# Patient Record
Sex: Female | Born: 1940 | Race: White | Hispanic: No | Marital: Married | Smoking: Former smoker
Health system: Southern US, Community
[De-identification: ages and names within clinical notes are randomized; demographics above are authoritative.]

## PROBLEM LIST (undated history)

## (undated) DIAGNOSIS — C9300 Acute monoblastic/monocytic leukemia, not having achieved remission: Secondary | ICD-10-CM

## (undated) DIAGNOSIS — I341 Nonrheumatic mitral (valve) prolapse: Secondary | ICD-10-CM

## (undated) DIAGNOSIS — E785 Hyperlipidemia, unspecified: Secondary | ICD-10-CM

## (undated) DIAGNOSIS — I5021 Acute systolic (congestive) heart failure: Secondary | ICD-10-CM

## (undated) DIAGNOSIS — R011 Cardiac murmur, unspecified: Secondary | ICD-10-CM

## (undated) DIAGNOSIS — I10 Essential (primary) hypertension: Secondary | ICD-10-CM

## (undated) DIAGNOSIS — Z9889 Other specified postprocedural states: Secondary | ICD-10-CM

## (undated) DIAGNOSIS — R112 Nausea with vomiting, unspecified: Secondary | ICD-10-CM

## (undated) DIAGNOSIS — F419 Anxiety disorder, unspecified: Secondary | ICD-10-CM

## (undated) HISTORY — DX: Nonrheumatic mitral (valve) prolapse: I34.1

## (undated) HISTORY — DX: Acute monoblastic/monocytic leukemia, not having achieved remission: C93.00

## (undated) HISTORY — DX: Hyperlipidemia, unspecified: E78.5

---

## 1968-05-28 HISTORY — PX: BREAST CYST EXCISION: SHX579

## 1976-05-28 HISTORY — PX: BREAST SURGERY: SHX581

## 1985-05-28 HISTORY — PX: HEMORRHOID SURGERY: SHX153

## 2007-05-29 HISTORY — PX: EYE SURGERY: SHX253

## 2011-04-09 DIAGNOSIS — H47399 Other disorders of optic disc, unspecified eye: Secondary | ICD-10-CM | POA: Insufficient documentation

## 2011-04-09 DIAGNOSIS — D3131 Benign neoplasm of right choroid: Secondary | ICD-10-CM | POA: Insufficient documentation

## 2011-04-09 DIAGNOSIS — Z961 Presence of intraocular lens: Secondary | ICD-10-CM | POA: Insufficient documentation

## 2013-11-27 ENCOUNTER — Encounter (HOSPITAL_COMMUNITY): Payer: Self-pay

## 2013-11-30 NOTE — H&P (Signed)
Kristina Meyer is an 73 y.o. female.   History of Present Illness [facility]; 11/30/2013 8:53 AM) The patient is a 73 year old female who comes in today for a preoperative history and physical. The patient is scheduled for a L5-S1 DISCECTOMY6, RIGHT to be performed by Dr. Duane Lope D. Rolena Infante, MD at [facility] on 12/02/2013 . Please see the hospital record for complete dictated history and physical.  Additional reasons for visit:  Follow-up back is described as the following: The patient is being followed for their back pain. Symptoms reported today include: pain, aching, leg pain (right , posteriorly to the toes), pain with lifting, pain with sitting and pain with standing. The patient states that they are doing poorly. Current treatment includes: activity modification and pain medications. The following medication has been used for pain control: Norco (and Gabapentin (has just started this)). The patient reports their current pain level to be 5-6 / 10.  Allergies [facility]; 11/30/2013 8:47 AM) No Known Drug Allergies06/05/2013  Social History [facility]; 11/30/2013 8:47 AM) Children 1 Current work status retired Games developer weekly; does gym / [employer] Living situation live with spouse Marital status married Never consumed alcohol 10/26/2013: Never consumed alcohol No history of drug/alcohol rehab Not under pain contract Tobacco / smoke exposure 10/26/2013: no Tobacco use Former smoker. 10/26/2013: smoke(d) 1/2 pack(s) per day  Medication History [facility]; 11/30/2013 8:47 AM) Lebron Quam (5-325MG  Tablet, 1 (one) Tablet Oral tid prn, Taken starting 11/16/2013) Active. (rx given at visiit ddb/smt 11/16/13) Neurontin (100MG  Capsule, 1 (one) Capsule Oral TID PRN, Taken starting 11/16/2013) Active. (RX GIVEN AT [facility]IS[facility] DDB/SMT 11/16/13) PARoxetine HCl (20MG  Tablet, Oral) Active. (1/2 tab qd) Atorvastatin Calcium (20MG  Tablet, Oral) Active. (qd) Sulfamethoxazole-TMP DS  (800-160MG  Tablet, Oral) Active. (QD)  Vitals [facility]; 11/30/2013 8:51 AM) 11/30/2013 8:48 AM Weight: 141.03 lb Height: 63in Body Surface Area: 1.69 m Body Mass Index: 24.98 kg/m Temp.: 97.50F  Pulse: 71 (Regular)  BP: 131/83 (Sitting, Left Arm, Standard)    Assessment & Plan Kristina Rinks M. 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   HENT: Negative.   Respiratory: Negative.   Cardiovascular: Negative.   Gastrointestinal: Negative.   Genitourinary: Negative.     There were no vitals taken for this visit. Physical Exam  Constitutional: She is oriented to person, place, and time. She appears well-developed.  HENT:  Head: Normocephalic and atraumatic.  Neck: Normal range of motion.  Cardiovascular: Normal rate.   Murmur heard. Respiratory: Effort normal and breath sounds normal.  GI: Soft. Bowel sounds are normal.  Neurological: She is alert and oriented to person, place, and time.  Skin: Skin is warm and dry.     Assessment/Plan Right L5-S1 HNP Will proceed with Meyer as scheduled.  Procedure along with possible risks and complications discussed.  All questions answered.    [facility] M 11/30/2013, 10:29 AM

## 2013-12-01 ENCOUNTER — Encounter (HOSPITAL_COMMUNITY)
Admission: RE | Admit: 2013-12-01 | Discharge: 2013-12-01 | Disposition: A | Discharge status: Home / Self Care | Payer: Medicare Other | Source: Ambulatory Visit | Attending: Orthopedic Surgery | Admitting: Orthopedic Surgery

## 2013-12-01 ENCOUNTER — Encounter (HOSPITAL_COMMUNITY): Payer: Self-pay

## 2013-12-01 HISTORY — DX: Anxiety disorder, unspecified: F41.9

## 2013-12-01 HISTORY — DX: Other specified postprocedural states: Z98.890

## 2013-12-01 HISTORY — DX: Nausea with vomiting, unspecified: R11.2

## 2013-12-01 HISTORY — DX: Cardiac murmur, unspecified: R01.1

## 2013-12-01 LAB — CBC
HCT: 39.6 % (ref 36.0–46.0)
HEMOGLOBIN: 12.4 g/dL (ref 12.0–15.0)
MCH: 29.7 pg (ref 26.0–34.0)
MCHC: 31.3 g/dL (ref 30.0–36.0)
MCV: 94.7 fL (ref 78.0–100.0)
PLATELETS: 219 10*3/uL (ref 150–400)
RBC: 4.18 MIL/uL (ref 3.87–5.11)
RDW: 12.3 % (ref 11.5–15.5)
WBC: 6.4 10*3/uL (ref 4.0–10.5)

## 2013-12-01 LAB — SURGICAL PCR SCREEN
MRSA, PCR: NEGATIVE
Staphylococcus aureus: NEGATIVE

## 2013-12-01 LAB — BASIC METABOLIC PANEL
Anion gap: 12 (ref 5–15)
BUN: 17 mg/dL (ref 6–23)
CO2: 26 mEq/L (ref 19–32)
Calcium: 9.3 mg/dL (ref 8.4–10.5)
Chloride: 103 mEq/L (ref 96–112)
Creatinine, Ser: 0.89 mg/dL (ref 0.50–1.10)
GFR calc Af Amer: 72 mL/min — ABNORMAL LOW (ref >90)
GFR, EST NON AFRICAN AMERICAN: 62 mL/min — AB (ref >90)
GLUCOSE: 99 mg/dL (ref 70–99)
Potassium: 4.5 mEq/L (ref 3.7–5.3)
Sodium: 141 mEq/L (ref 137–147)

## 2013-12-01 NOTE — H&P (Signed)
All questions addressed Agree with above Plan on surgery as scheduled

## 2013-12-01 NOTE — Pre-Procedure Instructions (Addendum)
Gemma Ruan  12/01/2013   Your procedure is scheduled on:  12/02/13  Report to [facility] short stay admitting at 35 AM.  Call this number if you have problems the morning of surgery: [phone]   Remember:   Do not eat food or drink liquids after midnight.   Take these medicines the morning of surgery with A SIP OF WATER: gabapentin, pain med if needed, paxil, bactrim     Take all meds as ordered until day of surgery except as instructed below or per dr         Bridgette Habermann all herbel meds, nsaids (aleve,naproxen,advil,ibuprofen) now including vitamins, aspirin   Do not wear jewelry, make-up or nail polish.  Do not wear lotions, powders, or perfumes. You may wear deodorant.  Do not shave 48 hours prior to surgery. Men may shave face and neck.  Do not bring valuables to the hospital.  [facility] is not responsible                  for any belongings or valuables.               Contacts, dentures or bridgework may not be worn into surgery.  Leave suitcase in the car. After surgery it may be brought to your room.  For patients admitted to the hospital, discharge time is determined by your                treatment team.               Patients discharged the day of surgery will not be allowed to drive  home.  Name and phone number of your driver:   Special Instructions:  Special Instructions: Wake - Preparing for Surgery  Before surgery, you can play an important role.  Because skin is not sterile, your skin needs to be as free of germs as possible.  You can reduce the number of germs on you skin by washing with CHG (chlorahexidine gluconate) soap before surgery.  CHG is an antiseptic cleaner which kills germs and bonds with the skin to continue killing germs even after washing.  Please DO NOT use if you have an allergy to CHG or antibacterial soaps.  If your skin becomes reddened/irritated stop using the CHG and inform your nurse when you arrive at Short Stay.  Do not shave (including  legs and underarms) for at least 48 hours prior to the first CHG shower.  You may shave your face.  Please follow these instructions carefully:   1.  Shower with CHG Soap the night before surgery and the morning of Surgery.  2.  If you choose to wash your hair, wash your hair first as usual with your normal shampoo.  3.  After you shampoo, rinse your hair and body thoroughly to remove the Shampoo.  4.  Use CHG as you would any other liquid soap.  You can apply chg directly  to the skin and wash gently with scrungie or a clean washcloth.  5.  Apply the CHG Soap to your body ONLY FROM THE NECK DOWN.  Do not use on open wounds or open sores.  Avoid contact with your eyes ears, mouth and genitals (private parts).  Wash genitals (private parts)       with your normal soap.  6.  Wash thoroughly, paying special attention to the area where your surgery will be performed.  7.  Thoroughly rinse your body with warm water from the  neck down.  8.  DO NOT shower/wash with your normal soap after using and rinsing off the CHG Soap.  9.  Pat yourself dry with a clean towel.            10.  Wear clean pajamas.            11.  Place clean sheets on your bed the night of your first shower and do not sleep with pets.  Day of Surgery  Do not apply any lotions/deodorants the morning of surgery.  Please wear clean clothes to the hospital/surgery center.   Please read over the following fact sheets that you were given: Pain Booklet, Coughing and Deep Breathing, MRSA Information and Surgical Site Infection Prevention

## 2013-12-02 ENCOUNTER — Ambulatory Visit (HOSPITAL_COMMUNITY)
Admission: RE | Admit: 2013-12-02 | Discharge: 2013-12-03 | Disposition: A | Discharge status: Home / Self Care | Payer: Medicare Other | Source: Ambulatory Visit | Attending: Orthopedic Surgery | Admitting: Orthopedic Surgery

## 2013-12-02 ENCOUNTER — Ambulatory Visit (HOSPITAL_COMMUNITY): Payer: Medicare Other

## 2013-12-02 ENCOUNTER — Encounter (HOSPITAL_COMMUNITY): Payer: Self-pay | Admitting: *Deleted

## 2013-12-02 ENCOUNTER — Ambulatory Visit (HOSPITAL_COMMUNITY): Payer: Medicare Other | Admitting: Anesthesiology

## 2013-12-02 ENCOUNTER — Encounter (HOSPITAL_COMMUNITY): Payer: Medicare Other | Admitting: Anesthesiology

## 2013-12-02 ENCOUNTER — Encounter (HOSPITAL_COMMUNITY)
Admission: RE | Disposition: A | Discharge status: Home / Self Care | Payer: Self-pay | Source: Ambulatory Visit | Attending: Orthopedic Surgery

## 2013-12-02 DIAGNOSIS — M549 Dorsalgia, unspecified: Secondary | ICD-10-CM | POA: Diagnosis present

## 2013-12-02 DIAGNOSIS — Z87891 Personal history of nicotine dependence: Secondary | ICD-10-CM | POA: Insufficient documentation

## 2013-12-02 DIAGNOSIS — Z9889 Other specified postprocedural states: Secondary | ICD-10-CM

## 2013-12-02 DIAGNOSIS — M5126 Other intervertebral disc displacement, lumbar region: Secondary | ICD-10-CM | POA: Insufficient documentation

## 2013-12-02 DIAGNOSIS — R011 Cardiac murmur, unspecified: Secondary | ICD-10-CM | POA: Insufficient documentation

## 2013-12-02 HISTORY — PX: LUMBAR LAMINECTOMY/DECOMPRESSION MICRODISCECTOMY: SHX5026

## 2013-12-02 SURGERY — LUMBAR LAMINECTOMY/DECOMPRESSION MICRODISCECTOMY 1 LEVEL
Anesthesia: General | Site: Spine Lumbar | Laterality: Right

## 2013-12-02 MED ORDER — CEFAZOLIN SODIUM 1-5 GM-% IV SOLN
1.0000 g | Freq: Three times a day (TID) | INTRAVENOUS | Status: AC
  Administered 2013-12-02 (×2): 1 g via INTRAVENOUS
  Filled 2013-12-02 (×3): qty 50

## 2013-12-02 MED ORDER — EPHEDRINE SULFATE 50 MG/ML IJ SOLN
INTRAMUSCULAR | Status: AC
  Filled 2013-12-02: qty 1

## 2013-12-02 MED ORDER — LIDOCAINE HCL (CARDIAC) 20 MG/ML IV SOLN
INTRAVENOUS | Status: DC | PRN
  Administered 2013-12-02: 40 mg via INTRAVENOUS

## 2013-12-02 MED ORDER — LIDOCAINE HCL (CARDIAC) 20 MG/ML IV SOLN
INTRAVENOUS | Status: AC
  Filled 2013-12-02: qty 5

## 2013-12-02 MED ORDER — DEXAMETHASONE 4 MG PO TABS
4.0000 mg | ORAL_TABLET | Freq: Four times a day (QID) | ORAL | Status: DC
  Administered 2013-12-02 – 2013-12-03 (×2): 4 mg via ORAL
  Filled 2013-12-02 (×8): qty 1

## 2013-12-02 MED ORDER — LACTATED RINGERS IV SOLN
INTRAVENOUS | Status: DC | PRN
  Administered 2013-12-02 (×2): via INTRAVENOUS

## 2013-12-02 MED ORDER — EPHEDRINE SULFATE 50 MG/ML IJ SOLN
INTRAMUSCULAR | Status: DC | PRN
  Administered 2013-12-02: 5 mg via INTRAVENOUS
  Administered 2013-12-02: 10 mg via INTRAVENOUS

## 2013-12-02 MED ORDER — OXYCODONE HCL 5 MG PO TABS: 5.0000 mg | ORAL_TABLET | Freq: Once | ORAL | Status: DC | PRN

## 2013-12-02 MED ORDER — SODIUM CHLORIDE 0.9 % IJ SOLN: 3.0000 mL | Freq: Two times a day (BID) | INTRAMUSCULAR | Status: DC

## 2013-12-02 MED ORDER — ACETAMINOPHEN 10 MG/ML IV SOLN
1000.0000 mg | Freq: Four times a day (QID) | INTRAVENOUS | Status: AC
  Administered 2013-12-02 – 2013-12-03 (×4): 1000 mg via INTRAVENOUS
  Filled 2013-12-02 (×3): qty 100

## 2013-12-02 MED ORDER — MIDAZOLAM HCL 2 MG/2ML IJ SOLN
INTRAMUSCULAR | Status: AC
  Filled 2013-12-02: qty 2

## 2013-12-02 MED ORDER — SODIUM CHLORIDE 0.9 % IJ SOLN
INTRAMUSCULAR | Status: AC
Start: 2013-12-02 — End: 2013-12-02
  Filled 2013-12-02: qty 10

## 2013-12-02 MED ORDER — SUFENTANIL CITRATE 50 MCG/ML IV SOLN
INTRAVENOUS | Status: AC
  Filled 2013-12-02: qty 1

## 2013-12-02 MED ORDER — NEOSTIGMINE METHYLSULFATE 10 MG/10ML IV SOLN
INTRAVENOUS | Status: DC | PRN
  Administered 2013-12-02: 3 mg via INTRAVENOUS

## 2013-12-02 MED ORDER — ACETAMINOPHEN 10 MG/ML IV SOLN
1000.0000 mg | Freq: Four times a day (QID) | INTRAVENOUS | Status: DC
  Administered 2013-12-02: 1000 mg via INTRAVENOUS
  Filled 2013-12-02: qty 100

## 2013-12-02 MED ORDER — SODIUM CHLORIDE 0.9 % IJ SOLN: 3.0000 mL | INTRAMUSCULAR | Status: DC | PRN

## 2013-12-02 MED ORDER — GLYCOPYRROLATE 0.2 MG/ML IJ SOLN
INTRAMUSCULAR | Status: AC
  Filled 2013-12-02: qty 2

## 2013-12-02 MED ORDER — CEFAZOLIN SODIUM-DEXTROSE 2-3 GM-% IV SOLR
INTRAVENOUS | Status: AC
  Administered 2013-12-02: 2 g via INTRAVENOUS
  Filled 2013-12-02: qty 50

## 2013-12-02 MED ORDER — ONDANSETRON HCL 4 MG/2ML IJ SOLN
INTRAMUSCULAR | Status: DC | PRN
  Administered 2013-12-02: 4 mg via INTRAVENOUS

## 2013-12-02 MED ORDER — SUCCINYLCHOLINE CHLORIDE 20 MG/ML IJ SOLN
INTRAMUSCULAR | Status: AC
Start: 2013-12-02 — End: 2013-12-02
  Filled 2013-12-02: qty 1

## 2013-12-02 MED ORDER — METOCLOPRAMIDE HCL 5 MG/ML IJ SOLN
INTRAMUSCULAR | Status: AC
  Filled 2013-12-02: qty 2

## 2013-12-02 MED ORDER — PHENYLEPHRINE 40 MCG/ML (10ML) SYRINGE FOR IV PUSH (FOR BLOOD PRESSURE SUPPORT)
PREFILLED_SYRINGE | INTRAVENOUS | Status: AC
  Filled 2013-12-02: qty 10

## 2013-12-02 MED ORDER — HYDROMORPHONE HCL PF 1 MG/ML IJ SOLN: 0.2500 mg | INTRAMUSCULAR | Status: DC | PRN

## 2013-12-02 MED ORDER — PAROXETINE HCL 20 MG PO TABS
20.0000 mg | ORAL_TABLET | Freq: Every day | ORAL | Status: DC
  Administered 2013-12-03: 20 mg via ORAL
  Filled 2013-12-02: qty 1

## 2013-12-02 MED ORDER — OXYCODONE HCL 5 MG/5ML PO SOLN: 5.0000 mg | Freq: Once | ORAL | Status: DC | PRN

## 2013-12-02 MED ORDER — ONDANSETRON HCL 4 MG/2ML IJ SOLN
INTRAMUSCULAR | Status: AC
  Filled 2013-12-02: qty 2

## 2013-12-02 MED ORDER — PROPOFOL 10 MG/ML IV BOLUS
INTRAVENOUS | Status: AC
  Filled 2013-12-02: qty 20

## 2013-12-02 MED ORDER — DEXAMETHASONE SODIUM PHOSPHATE 4 MG/ML IJ SOLN
4.0000 mg | Freq: Four times a day (QID) | INTRAMUSCULAR | Status: DC
Start: 2013-12-02 — End: 2013-12-03
  Administered 2013-12-02 (×2): 4 mg via INTRAVENOUS
  Filled 2013-12-02 (×8): qty 1

## 2013-12-02 MED ORDER — METOCLOPRAMIDE HCL 5 MG/ML IJ SOLN
INTRAMUSCULAR | Status: DC | PRN
  Administered 2013-12-02: 10 mg via INTRAVENOUS

## 2013-12-02 MED ORDER — GLYCOPYRROLATE 0.2 MG/ML IJ SOLN
INTRAMUSCULAR | Status: DC | PRN
  Administered 2013-12-02: 0.4 mg via INTRAVENOUS

## 2013-12-02 MED ORDER — ROCURONIUM BROMIDE 50 MG/5ML IV SOLN
INTRAVENOUS | Status: AC
  Filled 2013-12-02: qty 1

## 2013-12-02 MED ORDER — PROPOFOL 10 MG/ML IV BOLUS
INTRAVENOUS | Status: DC | PRN
  Administered 2013-12-02: 140 mg via INTRAVENOUS

## 2013-12-02 MED ORDER — 0.9 % SODIUM CHLORIDE (POUR BTL) OPTIME
TOPICAL | Status: DC | PRN
  Administered 2013-12-02: 1000 mL

## 2013-12-02 MED ORDER — PHENYLEPHRINE HCL 10 MG/ML IJ SOLN
INTRAMUSCULAR | Status: DC | PRN
  Administered 2013-12-02 (×3): 80 ug via INTRAVENOUS

## 2013-12-02 MED ORDER — ROCURONIUM BROMIDE 100 MG/10ML IV SOLN
INTRAVENOUS | Status: DC | PRN
  Administered 2013-12-02: 40 mg via INTRAVENOUS

## 2013-12-02 MED ORDER — MORPHINE SULFATE 2 MG/ML IJ SOLN: 1.0000 mg | INTRAMUSCULAR | Status: DC | PRN

## 2013-12-02 MED ORDER — SUFENTANIL CITRATE 50 MCG/ML IV SOLN
INTRAVENOUS | Status: DC | PRN
  Administered 2013-12-02: 20 ug via INTRAVENOUS
  Administered 2013-12-02: 5 ug via INTRAVENOUS

## 2013-12-02 MED ORDER — MENTHOL 3 MG MT LOZG
1.0000 | LOZENGE | OROMUCOSAL | Status: DC | PRN
Start: 2013-12-02 — End: 2013-12-03
  Filled 2013-12-02: qty 9

## 2013-12-02 MED ORDER — NEOSTIGMINE METHYLSULFATE 10 MG/10ML IV SOLN
INTRAVENOUS | Status: AC
  Filled 2013-12-02: qty 1

## 2013-12-02 MED ORDER — DEXAMETHASONE SODIUM PHOSPHATE 4 MG/ML IJ SOLN
4.0000 mg | Freq: Once | INTRAMUSCULAR | Status: AC
Start: 2013-12-02 — End: 2013-12-02
  Administered 2013-12-02: 4 mg via INTRAVENOUS
  Filled 2013-12-02: qty 1

## 2013-12-02 MED ORDER — THROMBIN 20000 UNITS EX SOLR
CUTANEOUS | Status: AC
  Filled 2013-12-02: qty 20000

## 2013-12-02 MED ORDER — METOCLOPRAMIDE HCL 5 MG/ML IJ SOLN: 10.0000 mg | Freq: Once | INTRAMUSCULAR | Status: DC | PRN

## 2013-12-02 MED ORDER — LACTATED RINGERS IV SOLN
INTRAVENOUS | Status: DC
  Administered 2013-12-02: 12:00:00 via INTRAVENOUS

## 2013-12-02 MED ORDER — SODIUM CHLORIDE 0.9 % IV SOLN: 250.0000 mL | INTRAVENOUS | Status: DC

## 2013-12-02 MED ORDER — ATORVASTATIN CALCIUM 10 MG PO TABS
10.0000 mg | ORAL_TABLET | Freq: Every day | ORAL | Status: DC
  Administered 2013-12-02: 10 mg via ORAL
  Filled 2013-12-02 (×2): qty 1

## 2013-12-02 MED ORDER — PHENOL 1.4 % MT LIQD
1.0000 | OROMUCOSAL | Status: DC | PRN
  Administered 2013-12-02: 1 via OROMUCOSAL

## 2013-12-02 MED ORDER — METHOCARBAMOL 1000 MG/10ML IJ SOLN
500.0000 mg | Freq: Four times a day (QID) | INTRAVENOUS | Status: DC | PRN
  Filled 2013-12-02: qty 5

## 2013-12-02 MED ORDER — ONDANSETRON HCL 4 MG/2ML IJ SOLN: 4.0000 mg | INTRAMUSCULAR | Status: DC | PRN

## 2013-12-02 MED ORDER — MIDAZOLAM HCL 5 MG/5ML IJ SOLN
INTRAMUSCULAR | Status: DC | PRN
  Administered 2013-12-02: 1 mg via INTRAVENOUS

## 2013-12-02 MED ORDER — BUPIVACAINE-EPINEPHRINE 0.25% -1:200000 IJ SOLN
INTRAMUSCULAR | Status: DC | PRN
  Administered 2013-12-02: 10 mL

## 2013-12-02 MED ORDER — THROMBIN 20000 UNITS EX SOLR
CUTANEOUS | Status: DC | PRN
  Administered 2013-12-02: 09:00:00 via TOPICAL

## 2013-12-02 MED ORDER — OXYCODONE HCL 5 MG PO TABS
10.0000 mg | ORAL_TABLET | ORAL | Status: DC | PRN
  Administered 2013-12-02: 10 mg via ORAL
  Filled 2013-12-02: qty 2

## 2013-12-02 MED ORDER — METHOCARBAMOL 500 MG PO TABS
500.0000 mg | ORAL_TABLET | Freq: Four times a day (QID) | ORAL | Status: DC | PRN
  Administered 2013-12-02: 500 mg via ORAL
  Filled 2013-12-02: qty 1

## 2013-12-02 MED ORDER — BUPIVACAINE-EPINEPHRINE (PF) 0.25% -1:200000 IJ SOLN
INTRAMUSCULAR | Status: AC
  Filled 2013-12-02: qty 30

## 2013-12-02 SURGICAL SUPPLY — 62 items
BNDG GAUZE ELAST 4 BULKY (GAUZE/BANDAGES/DRESSINGS) ×3 IMPLANT
BUR EGG ELITE 4.0 (BURR) IMPLANT
BUR EGG ELITE 4.0MM (BURR)
BUR MATCHSTICK NEURO 3.0 LAGG (BURR) IMPLANT
CANISTER SUCTION 2500CC (MISCELLANEOUS) ×3 IMPLANT
CLOSURE STERI-STRIP 1/2X4 (GAUZE/BANDAGES/DRESSINGS) ×1
CLSR STERI-STRIP ANTIMIC 1/2X4 (GAUZE/BANDAGES/DRESSINGS) ×2 IMPLANT
CORDS BIPOLAR (ELECTRODE) ×3 IMPLANT
COVER SURGICAL LIGHT HANDLE (MISCELLANEOUS) ×3 IMPLANT
DRAIN CHANNEL 15F RND FF W/TCR (WOUND CARE) ×3 IMPLANT
DRAPE POUCH INSTRU U-SHP 10X18 (DRAPES) ×3 IMPLANT
DRAPE SURG 17X23 STRL (DRAPES) ×3 IMPLANT
DRAPE U-SHAPE 47X51 STRL (DRAPES) ×3 IMPLANT
DRSG MEPILEX BORDER 4X4 (GAUZE/BANDAGES/DRESSINGS) ×3 IMPLANT
DRSG MEPILEX BORDER 4X8 (GAUZE/BANDAGES/DRESSINGS) ×3 IMPLANT
DURAPREP 26ML APPLICATOR (WOUND CARE) ×3 IMPLANT
ELECT BLADE 4.0 EZ CLEAN MEGAD (MISCELLANEOUS) ×3
ELECT CAUTERY BLADE 6.4 (BLADE) ×3 IMPLANT
ELECT PENCIL ROCKER SW 15FT (MISCELLANEOUS) ×3 IMPLANT
ELECT REM PT RETURN 9FT ADLT (ELECTROSURGICAL) ×3
ELECTRODE BLDE 4.0 EZ CLN MEGD (MISCELLANEOUS) ×1 IMPLANT
ELECTRODE REM PT RTRN 9FT ADLT (ELECTROSURGICAL) ×1 IMPLANT
EVACUATOR SILICONE 100CC (DRAIN) ×3 IMPLANT
GLOVE BIOGEL PI IND STRL 8 (GLOVE) ×1 IMPLANT
GLOVE BIOGEL PI IND STRL 8.5 (GLOVE) ×1 IMPLANT
GLOVE BIOGEL PI INDICATOR 8 (GLOVE) ×2
GLOVE BIOGEL PI INDICATOR 8.5 (GLOVE) ×2
GLOVE ECLIPSE 8.5 STRL (GLOVE) ×3 IMPLANT
GLOVE ORTHO TXT STRL SZ7.5 (GLOVE) ×3 IMPLANT
GOWN STRL REUS W/ TWL LRG LVL3 (GOWN DISPOSABLE) ×1 IMPLANT
GOWN STRL REUS W/ TWL XL LVL3 (GOWN DISPOSABLE) ×2 IMPLANT
GOWN STRL REUS W/TWL 2XL LVL3 (GOWN DISPOSABLE) ×6 IMPLANT
GOWN STRL REUS W/TWL LRG LVL3 (GOWN DISPOSABLE) ×2
GOWN STRL REUS W/TWL XL LVL3 (GOWN DISPOSABLE) ×4
KIT BASIN OR (CUSTOM PROCEDURE TRAY) ×3 IMPLANT
KIT ROOM TURNOVER OR (KITS) ×3 IMPLANT
NEEDLE 22X1 1/2 (OR ONLY) (NEEDLE) ×3 IMPLANT
NEEDLE SPNL 18GX3.5 QUINCKE PK (NEEDLE) ×6 IMPLANT
NS IRRIG 1000ML POUR BTL (IV SOLUTION) ×3 IMPLANT
PACK LAMINECTOMY ORTHO (CUSTOM PROCEDURE TRAY) ×3 IMPLANT
PACK UNIVERSAL I (CUSTOM PROCEDURE TRAY) ×3 IMPLANT
PAD ARMBOARD 7.5X6 YLW CONV (MISCELLANEOUS) ×6 IMPLANT
PATTIES SURGICAL .5 X.5 (GAUZE/BANDAGES/DRESSINGS) IMPLANT
PATTIES SURGICAL .5 X1 (DISPOSABLE) ×3 IMPLANT
SPONGE SURGIFOAM ABS GEL 100 (HEMOSTASIS) ×3 IMPLANT
SURGIFLO TRUKIT (HEMOSTASIS) IMPLANT
SUT BONE WAX W31G (SUTURE) ×3 IMPLANT
SUT MON AB 3-0 SH 27 (SUTURE) ×2
SUT MON AB 3-0 SH27 (SUTURE) ×1 IMPLANT
SUT VIC AB 0 CT1 27 (SUTURE) ×2
SUT VIC AB 0 CT1 27XBRD ANBCTR (SUTURE) ×1 IMPLANT
SUT VIC AB 1 CT1 18XCR BRD 8 (SUTURE) ×1 IMPLANT
SUT VIC AB 1 CT1 8-18 (SUTURE) ×2
SUT VIC AB 1 CTX 36 (SUTURE) ×4
SUT VIC AB 1 CTX36XBRD ANBCTR (SUTURE) ×2 IMPLANT
SUT VIC AB 2-0 CT1 18 (SUTURE) ×3 IMPLANT
SYR BULB IRRIGATION 50ML (SYRINGE) ×3 IMPLANT
SYR CONTROL 10ML LL (SYRINGE) ×3 IMPLANT
TOWEL OR 17X24 6PK STRL BLUE (TOWEL DISPOSABLE) ×3 IMPLANT
TOWEL OR 17X26 10 PK STRL BLUE (TOWEL DISPOSABLE) ×3 IMPLANT
WATER STERILE IRR 1000ML POUR (IV SOLUTION) IMPLANT
YANKAUER SUCT BULB TIP NO VENT (SUCTIONS) ×3 IMPLANT

## 2013-12-02 NOTE — Progress Notes (Signed)
Pts partial teeth given back to pt

## 2013-12-02 NOTE — Brief Op Note (Signed)
12/02/2013  10:02 AM  PATIENT:  [facility]  73 y.o. female  PRE-OPERATIVE DIAGNOSIS:  L5-S1 RIGHT HNP  POST-OPERATIVE DIAGNOSIS:  L5-S1 RIGHT HNP  PROCEDURE:  Procedure(s): L5-S1 RIGHT DISCECTOMY (Right)  SURGEON:  Surgeon(s) and Role:    * Melina Schools, MD - Primary  PHYSICIAN ASSISTANT:   ASSISTANTS: Benjiman Core   ANESTHESIA:   general  EBL:  Total I/O In: 1000 [I.V.:1000] Out: -   BLOOD ADMINISTERED:none  DRAINS: none   LOCAL MEDICATIONS USED:  MARCAINE     SPECIMEN:  No Specimen  DISPOSITION OF SPECIMEN:  N/A  COUNTS:  YES  TOURNIQUET:  * No tourniquets in log *  DICTATION: .Other Dictation: Dictation Number [id number]  PLAN OF CARE: Admit for overnight observation  PATIENT DISPOSITION:  PACU - hemodynamically stable.

## 2013-12-02 NOTE — Anesthesia Postprocedure Evaluation (Signed)
Anesthesia Post Note  Patient: Kristina [facility]  Procedure(s) Performed: Procedure(s) (LRB): L5-S1 RIGHT DISCECTOMY (Right)  Anesthesia type: General  Patient location: PACU  Post pain: Pain level controlled  Post assessment: Patient's Cardiovascular Status Stable  Last Vitals:  Filed Vitals:   12/02/13 1052  BP:   Pulse: 90  Temp: 36.1 C  Resp: 8    Post vital signs: Reviewed and stable  Level of consciousness: alert  Complications: No apparent anesthesia complications

## 2013-12-02 NOTE — Anesthesia Preprocedure Evaluation (Addendum)
Anesthesia Evaluation  Patient identified by MRN, date of birth, ID band Patient awake    Reviewed: Allergy & Precautions, H&P , NPO status , Patient's Chart, lab work & pertinent test results, reviewed documented beta blocker date and time   History of Anesthesia Complications (+) PONV and history of anesthetic complications  Airway Mallampati: II TM Distance: >3 FB Neck ROM: full    Dental   Pulmonary neg pulmonary ROS, former smoker,  breath sounds clear to auscultation        Cardiovascular + Valvular Problems/Murmurs Rhythm:regular     Neuro/Psych negative neurological ROS     GI/Hepatic negative GI ROS, Neg liver ROS,   Endo/Other  negative endocrine ROS  Renal/GU negative Renal ROS  negative genitourinary   Musculoskeletal   Abdominal   Peds  Hematology negative hematology ROS (+)   Anesthesia Other Findings See surgeon's H&P   Reproductive/Obstetrics negative OB ROS                          Anesthesia Physical Anesthesia Plan  ASA: II  Anesthesia Plan: General   Post-op Pain Management:    Induction: Intravenous  Airway Management Planned: Oral ETT  Additional Equipment:   Intra-op Plan:   Post-operative Plan: Extubation in OR  Informed Consent: I have reviewed the patients History and Physical, chart, labs and discussed the procedure including the risks, benefits and alternatives for the proposed anesthesia with the patient or authorized representative who has indicated his/her understanding and acceptance.   Dental Advisory Given  Plan Discussed with: CRNA and Surgeon  Anesthesia Plan Comments:        Anesthesia Quick Evaluation

## 2013-12-02 NOTE — Evaluation (Signed)
Physical Therapy Evaluation Patient Details Name: Kristina [facility] MRN: [id number] DOB: 11-18-1940 Today's Date: 12/02/2013   History of Present Illness  Pt presents for L5-S1 diskectomy  Clinical Impression  Pt admitted with lower back and right leg pain which is currently not present since surgery. Pt ambulated 250' with no AD, mod I level as well as ascending and descending 5 steps with supervision and no difficulties. Back precautions and posture reviewed. No acute or f.u PT needs at this time. PT signing off.     Follow Up Recommendations No PT follow up;Other (comment) (OP in future if needed)    Equipment Recommendations  None recommended by PT    Recommendations for Other Services       Precautions / Restrictions Precautions Precautions: Back Precaution Booklet Issued: Yes (comment) Precaution Comments: reviewed back precautions and proper posture and positioning Required Braces or Orthoses: Spinal Brace Spinal Brace: Lumbar corset;Applied in sitting position Restrictions Weight Bearing Restrictions: No      Mobility  Bed Mobility Overal bed mobility: Modified Independent             General bed mobility comments: reviewed log rolling for healthy back during bed mobility, pt performed safety  Transfers Overall transfer level: Modified independent Equipment used: None                Ambulation/Gait Ambulation/Gait assistance: Modified independent (Device/Increase time) Ambulation Distance (Feet): 250 Feet Assistive device: Rolling walker (2 wheeled);None Gait Pattern/deviations: Decreased stride length Gait velocity: decreased, guarded   General Gait Details: began ambulation with RW in case pt experienced dizziness but she did not and was not unsteady so no AD used after first 39'  Stairs Stairs: Yes Stairs assistance: Supervision Stair Management: One rail Right;Alternating pattern;Forwards Number of Stairs: 5 General stair comments: pt had no  difficulty with stairs and was able to perform with alternating pattern  Wheelchair Mobility    Modified Rankin (Stroke Patients Only)       Balance Overall balance assessment: No apparent balance deficits (not formally assessed)                                           Pertinent Vitals/Pain No c/o pain    Home Living Family/patient expects to be discharged to:: Private residence Living Arrangements: Spouse/significant other Available Help at Discharge: Family;Available 24 hours/day Type of Home: House Home Access: Stairs to enter Entrance Stairs-Rails: Right Entrance Stairs-Number of Steps: 4 Home Layout: Two level Home Equipment: Cane - single point Additional Comments: pt used cane on stairs and out of house PTA    Prior Function Level of Independence: Independent with assistive device(s)         Comments: pt went to low impact aerobics 3x/ week before surgery     Hand Dominance        Extremity/Trunk Assessment   Upper Extremity Assessment: Overall WFL for tasks assessed           Lower Extremity Assessment: Overall WFL for tasks assessed      Cervical / Trunk Assessment: Normal  Communication   Communication: No difficulties  Cognition Arousal/Alertness: Awake/alert Behavior During Therapy: WFL for tasks assessed/performed Overall Cognitive Status: Within Functional Limits for tasks assessed                      General Comments General comments (skin integrity, edema,  etc.): pt reports that radicular pain before surgery is currently gone    Exercises Other Exercises Other Exercises: pelvic tilt in supine x5 for bed mobility and abdominal activation      Assessment/Plan    PT Assessment Patent does not need any further PT services  PT Diagnosis     PT Problem List    PT Treatment Interventions     PT Goals (Current goals can be found in the Care Plan section) Acute Rehab PT Goals Patient Stated Goal:  return home PT Goal Formulation: No goals set, d/c therapy    Frequency     Barriers to discharge        Co-evaluation               End of Session Equipment Utilized During Treatment: Back brace;Gait belt Activity Tolerance: Patient tolerated treatment well Patient left: in bed;with call bell/phone within reach;with family/visitor present Nurse Communication: Mobility status    Functional Assessment Tool Used: clinical judgement Functional Limitation: Mobility: Walking and moving around Mobility: Walking and Moving Around Current Status (B3419): At least 1 percent but less than 20 percent impaired, limited or restricted Mobility: Walking and Moving Around Goal Status [phone]): At least 1 percent but less than 20 percent impaired, limited or restricted Mobility: Walking and Moving Around Discharge Status [phone]): At least 1 percent but less than 20 percent impaired, limited or restricted    Time: 1510-1535 PT Time Calculation (min): 25 min   Charges:   PT Evaluation $Initial PT Evaluation Tier I: 1 Procedure PT Treatments $Gait Training: 23-37 mins   PT G Codes:   Functional Assessment Tool Used: clinical judgement Functional Limitation: Mobility: Walking and moving around   [facility], [place]  Leighton Roach 12/02/2013, 3:48 PM

## 2013-12-02 NOTE — Anesthesia Procedure Notes (Signed)
Procedure Name: Intubation Date/Time: 12/02/2013 8:33 AM Performed by: Melina Copa, Aviyanna Colbaugh R Pre-anesthesia Checklist: Patient identified, Emergency Drugs available, Suction available, Patient being monitored and Timeout performed Patient Re-evaluated:Patient Re-evaluated prior to inductionOxygen Delivery Method: Circle system utilized Preoxygenation: Pre-oxygenation with 100% oxygen Intubation Type: IV induction Ventilation: Mask ventilation without difficulty Laryngoscope Size: Mac and 3 Grade View: Grade II Tube type: Oral Tube size: 7.5 mm Number of attempts: 1 Airway Equipment and Method: Stylet Placement Confirmation: ETT inserted through vocal cords under direct vision,  positive ETCO2 and breath sounds checked- equal and bilateral Secured at: 21 cm Tube secured with: Tape Dental Injury: Teeth and Oropharynx as per pre-operative assessment

## 2013-12-02 NOTE — Transfer of Care (Signed)
Immediate Anesthesia Transfer of Care Note  Patient: Krist[facility]  Procedure(s) Performed: Procedure(s): L5-S1 RIGHT DISCECTOMY (Right)  Patient Location: PACU  Anesthesia Type:General  Level of Consciousness: awake  Airway & Oxygen Therapy: Patient Spontanous Breathing and Patient connected to nasal cannula oxygen  Post-op Assessment: Report given to PACU RN, Post -op Vital signs reviewed and stable and Patient moving all extremities  Post vital signs: Reviewed and stable  Complications: No apparent anesthesia complications

## 2013-12-02 NOTE — H&P (Signed)
No change in clinical exam Right leg pain R L5/S1 HNP H+P reviewed

## 2013-12-02 NOTE — Progress Notes (Signed)
CSW received consult for SNF placement. CSW went to speak to patient regarding SNF placement. Patient states she is not interested in going to a SNF and lives with her husband who is her main support. CSW will notify Case Manager RN to follow up with patient for Home Health pending PT eval. CSW signing off at this time. Please re consult if social work needs arise.  Jeanette Caprice, MSW, [place]

## 2013-12-03 MED ORDER — ONDANSETRON 4 MG PO TBDP
4.0000 mg | ORAL_TABLET | Freq: Three times a day (TID) | ORAL | Status: DC | PRN
Start: 2013-12-03 — End: 2015-12-21

## 2013-12-03 MED ORDER — DOCUSATE SODIUM 100 MG PO CAPS: 100.0000 mg | ORAL_CAPSULE | Freq: Two times a day (BID) | ORAL | Status: DC

## 2013-12-03 MED ORDER — POLYETHYLENE GLYCOL 3350 17 G PO PACK: 17.0000 g | PACK | Freq: Every day | ORAL | Status: DC

## 2013-12-03 MED ORDER — METHOCARBAMOL 500 MG PO TABS: 500.0000 mg | ORAL_TABLET | Freq: Four times a day (QID) | ORAL | Status: DC | PRN

## 2013-12-03 MED ORDER — OXYCODONE-ACETAMINOPHEN 5-325 MG PO TABS: 1.0000 | ORAL_TABLET | Freq: Four times a day (QID) | ORAL | Status: DC | PRN

## 2013-12-03 NOTE — Evaluation (Signed)
Occupational Therapy Evaluation and Discharge Patient Details Name: Kristina Meyer MRN: [id number] DOB: 22-Aug-1940 Today's Date: 12/03/2013    History of Present Illness Pt presents for L5-S1 diskectomy   Clinical Impression   This 73 yo female admitted and underwent above presents to acute OT with all education completed. Acute OT will sign off.    Follow Up Recommendations  No OT follow up    Equipment Recommendations  None recommended by OT       Precautions / Restrictions Precautions Precautions: Back Precaution Comments: Pt able to state all 3 back precautions Required Braces or Orthoses: Spinal Brace Spinal Brace: Lumbar corset;Applied in sitting position Restrictions Weight Bearing Restrictions: No      Mobility Bed Mobility               General bed mobility comments: Pt up in room upon arrival  Transfers Overall transfer level: Modified independent                         ADL                                         General ADL Comments: Pt can doff socks sufficiently but I recommended to her to let her husband don her socks for now, since this does strain her a bit to do this. I educated her on the most efficient way to get dressed. She has a built in Civil engineer, contracting and comfort height toliets at home, so no issues with these BADLs.               Pertinent Vitals/Pain No c/o pain     Hand Dominance Right   Extremity/Trunk Assessment Upper Extremity Assessment Upper Extremity Assessment: Overall WFL for tasks assessed           Communication Communication Communication: No difficulties   Cognition Arousal/Alertness: Awake/alert Behavior During Therapy: WFL for tasks assessed/performed Overall Cognitive Status: Within Functional Limits for tasks assessed                                Home Living Family/patient expects to be discharged to:: Private residence Living Arrangements: Spouse/significant  other Available Help at Discharge: Family;Available 24 hours/day Type of Home: House Home Access: Stairs to enter [employer] of Steps: 4 Entrance Stairs-Rails: Right Home Layout: Two level Alternate Level Stairs-Number of Steps: flight Alternate Level Stairs-Rails: Right Bathroom Shower/Tub: Occupational psychologist: Standard     Home Equipment: Cane - single point;Shower seat   Additional Comments: pt used cane on stairs and out of house PTA      Prior Functioning/Environment Level of Independence: Independent with assistive device(s)        Comments: pt went to low impact aerobics 3x/ week before surgery             OT Goals(Current goals can be found in the care plan section) Acute Rehab OT Goals Patient Stated Goal: home today  OT Frequency:                End of Session Equipment Utilized During Treatment: Back brace  Activity Tolerance: Patient tolerated treatment well Patient left: with family/visitor present (sitting EOB)   Time: 4098-1191 OT Time Calculation (min): 10 min Charges:  OT General Charges $OT Visit:  1 Procedure OT Evaluation $Initial OT Evaluation Tier I: 1 Procedure G-Codes: OT G-codes **NOT FOR INPATIENT CLASS** Functional Assessment Tool Used: Clinical obsevation Functional Limitation: Self care Self Care Current Status (L9747): At least 1 percent but less than 20 percent impaired, limited or restricted Self Care Goal Status (V8550): At least 1 percent but less than 20 percent impaired, limited or restricted Self Care Discharge Status [phone]): At least 1 percent but less than 20 percent impaired, limited or restricted  Almon Register [phone] 12/03/2013, 11:15 AM

## 2013-12-03 NOTE — Care Management Note (Signed)
CARE MANAGEMENT NOTE 12/03/2013  Patient:  Kristina Meyer, Kristina Meyer   Account Number:  [id number]  Date Initiated:  12/03/2013  Documentation initiated by:  Ricki Miller  Subjective/Objective Assessment:   73 yr old female s/p L5-S1 discectomy.     Action/Plan:   No home health or DME needs identified by Case manager and physical therapy.   Anticipated DC Date:  12/03/2013   Anticipated DC Plan:  [place]  CM consult      PAC Choice  NA   Choice offered to / List presented to:     DME arranged  NA        HH arranged  NA      Status of service:  Completed, signed off Medicare Important Message given?   (If response is "NO", the following Medicare IM given date fields will be blank) Date Medicare IM given:   Medicare IM given by:   Date Additional Medicare IM given:   Additional Medicare IM given by:    Discharge Disposition:  HOME/SELF CARE

## 2013-12-03 NOTE — Op Note (Signed)
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  OPERATIVE PROCEDURE:  Lumbar microdiskectomy L5-S1.  CPT code [phone].  COMPLICATIONS:  None.  CONDITION:  Stable.  HISTORY:  This is a very pleasant 73 year old woman who presented to my office with complaints of severe right radicular leg pain, acute in onset.  Attempts at conservative management have failed to alleviate her symptoms, and preoperative MRI confirmed the diagnosis.  As a result of the severe right leg pain and numbness and dysesthesias, we elected to proceed with surgery.  All appropriate risks, benefits, and alternatives were discussed with the patient.  OPERATIVE NOTE:  The patient was brought to the operating room, placed supine on the operating table.  After successful induction of general anesthesia and endotracheal intubation, TEDs, SCDs were applied.  She was turned prone onto the Wilson frame and all bony prominences were well padded.  The back was prepped and draped in standard fashion.  Time- out was taken to confirm the patient, procedure, and all important pertinent data.  Once this was done, the needles were placed in the back to mark out the skin incision and an x-ray was taken.  Once I confirmed the L5-S1 level, the skin incision site was infiltrated with 0.25% Marcaine.  A sharp dissection was made down to the level of deep fascia. Deep fascia was sharply incised.  Used Cobb elevator and stripped the paraspinal muscles to expose the L5-S1 space.  Penfield 4 was placed under the L5  lamina and a second intraoperative x-ray was taken to confirm that we were at the appropriate level.  Once this was done, a laminotomy of L5 was done using a 2 and 3 mm Kerrison.  I then released the ligamentum flavum using a small nerve curette from the leading edge of S1 lamina.  I then exposed the underlying thecal sac.  I then used Penfield 4 to dissect into the lateral gutter.  I removed some of the overhanging osteophyte from the superior S1 facet complex.  At this point, I could now visualize the S1 nerve root.  The nerve root itself was dorsally displaced and under significant tension.  I gently began mobilizing the thecal sac and ultimately was able to mobilize the nerve root to expose the disk fragment.  At this point, I used the nerve hooks to mobilize the disk fragment.  I then used a micropituitary rongeur to remove the disk fragment.  There was considerable amount of disk herniation of the posterior lateral gutter that was soft __________, consistent with the MRI findings.  At this point, the S1 nerve root was now under no tension.  It was easily mobilized.  I took a Surveyor, quantity, I was easily able to go into the S1  foramen and along the lateral recess confirming an adequate decompression.  I was also able to take my nerve hook as well as the [facility] and circumferentially evaluated underneath the thecal sac.  There was no further compression noted.  I had removed all of the disk fragment.  Superiorly in the lateral recess, there was also no tension.  I irrigated the wound copiously with normal saline.  At this point, thrombin-soaked Gelfoam patty was placed over the exposed thecal sac, and then I closed the deep fascia with interrupted #1 Vicryl sutures, superficial with 2-0 Vicryl sutures, and 3-0 Monocryl for the skin.  Steri-Strips and dry dressing were applied.  The patient was ultimately extubated and transferred to PACU without incident.  At the end of the  case, all needle and sponge counts were correct.  There were no adverse intraoperative events.  First assistant was Alyson Locket. Ricard Dillon, my PA, he was instrumental in assisting with retraction, visualization, and wound closure.     Dahlia Bailiff, MD     DDB/MEDQ  D:  12/02/2013  T:  12/03/2013  Job:  [id number]

## 2013-12-03 NOTE — Progress Notes (Signed)
    Subjective: Procedure(s) (LRB): L5-S1 RIGHT DISCECTOMY (Right) 1 Day Post-Op  Patient reports pain as 0 on 0-10 scale.  Reports none leg pain denies incisional back pain   Positive void Negative bowel movement Positive flatus Negative chest pain or shortness of breath  Objective: Vital signs in last 24 hours: Temp:  [97.9 F (36.6 C)-98.2 F (36.8 C)] 98.1 F (36.7 C) (07/09 5397) Pulse Rate:  [80-95] 82 (07/09 0608) Resp:  [14-16] 14 (07/09 0608) BP: (122-133)/(49-80) 123/55 mmHg (07/09 0608) SpO2:  [97 %-100 %] 100 % (07/09 6734)  Intake/Output from previous day: 07/08 0701 - 07/09 0700 In: 2223.3 [P.O.:240; I.V.:1733.3; IV Piggyback:250] Out: 54 [Urine:4; Blood:50]  Labs:  Recent Labs  12/01/13 1014  WBC 6.4  RBC 4.18  HCT 39.6  PLT 219    Recent Labs  12/01/13 1014  NA 141  K 4.5  CL 103  CO2 26  BUN 17  CREATININE 0.89  GLUCOSE 99  CALCIUM 9.3   No results found for this basename: LABPT, INR,  in the last 72 hours  Physical Exam: Neurologically intact ABD soft Intact pulses distally Incision: dressing C/D/I Compartment soft negative st. leg raise  Assessment/Plan: Patient stable  xrays n/a Continue mobilization with physical therapy Continue care  Advance diet Up with therapy D/c to home   Melina Schools, MD [place] [phone]

## 2013-12-04 ENCOUNTER — Encounter (HOSPITAL_COMMUNITY): Payer: Self-pay | Admitting: Orthopedic Surgery

## 2015-12-16 ENCOUNTER — Telehealth: Payer: Self-pay | Admitting: Hematology & Oncology

## 2015-12-16 ENCOUNTER — Other Ambulatory Visit: Payer: Self-pay | Admitting: Hematology and Oncology

## 2015-12-16 NOTE — Telephone Encounter (Signed)
Contact Dr. Marin Olp office Regarding adjusting appt for pt due to new dx of acute lukemia or out bound referral per on call md

## 2015-12-21 ENCOUNTER — Ambulatory Visit: Payer: Medicare Other

## 2015-12-21 ENCOUNTER — Ambulatory Visit (HOSPITAL_BASED_OUTPATIENT_CLINIC_OR_DEPARTMENT_OTHER): Payer: Medicare Other | Admitting: Hematology & Oncology

## 2015-12-21 ENCOUNTER — Encounter: Payer: Self-pay | Admitting: Hematology & Oncology

## 2015-12-21 ENCOUNTER — Other Ambulatory Visit (HOSPITAL_BASED_OUTPATIENT_CLINIC_OR_DEPARTMENT_OTHER): Payer: Medicare Other

## 2015-12-21 VITALS — BP 139/54 | HR 81 | Temp 97.8°F | Resp 16 | Ht 62.5 in | Wt 143.0 lb

## 2015-12-21 DIAGNOSIS — D72829 Elevated white blood cell count, unspecified: Secondary | ICD-10-CM | POA: Diagnosis not present

## 2015-12-21 DIAGNOSIS — C9210 Chronic myeloid leukemia, BCR/ABL-positive, not having achieved remission: Secondary | ICD-10-CM

## 2015-12-21 LAB — MANUAL DIFFERENTIAL (CHCC SATELLITE)
ALC: 11.9 10*3/uL — ABNORMAL HIGH (ref 0.6–2.2)
ANC (CHCC HP manual diff): 20.2 10*3/uL — ABNORMAL HIGH (ref 1.5–6.7)
BASO: 1 % (ref 0–2)
Band Neutrophils: 3 % (ref 0–10)
Eos: 2 % (ref 0–7)
LYMPH: 29 % (ref 14–48)
MONO: 18 % — AB (ref 0–13)
Metamyelocytes: 2 % — ABNORMAL HIGH (ref 0–0)
Myelocytes: 5 % — ABNORMAL HIGH (ref 0–0)
NRBC: 1 % — AB (ref 0–0)
PLT EST ~~LOC~~: DECREASED
PROMYELO: 1 % — AB (ref 0–0)
SEG: 39 % — AB (ref 40–75)

## 2015-12-21 LAB — CBC WITH DIFFERENTIAL (CANCER CENTER ONLY)
HEMATOCRIT: 32.4 % — AB (ref 34.8–46.6)
HEMOGLOBIN: 10.3 g/dL — AB (ref 11.6–15.9)
MCH: 33.6 pg (ref 26.0–34.0)
MCHC: 31.8 g/dL — AB (ref 32.0–36.0)
MCV: 106 fL — ABNORMAL HIGH (ref 81–101)
Platelets: 125 10*3/uL — ABNORMAL LOW (ref 145–400)
RBC: 3.07 10*6/uL — ABNORMAL LOW (ref 3.70–5.32)
RDW: 14.9 % (ref 11.1–15.7)
WBC: 41.2 10*3/uL — ABNORMAL HIGH (ref 3.9–10.0)

## 2015-12-21 LAB — COMPREHENSIVE METABOLIC PANEL
ALK PHOS: 68 U/L (ref 40–150)
ALT: 26 U/L (ref 0–55)
AST: 36 U/L — ABNORMAL HIGH (ref 5–34)
Albumin: 3.9 g/dL (ref 3.5–5.0)
Anion Gap: 10 mEq/L (ref 3–11)
BUN: 21.5 mg/dL (ref 7.0–26.0)
CALCIUM: 9.6 mg/dL (ref 8.4–10.4)
CO2: 29 mEq/L (ref 22–29)
Chloride: 101 mEq/L (ref 98–109)
Creatinine: 1.4 mg/dL — ABNORMAL HIGH (ref 0.6–1.1)
EGFR: 36 mL/min/{1.73_m2} — AB (ref >90)
Glucose: 116 mg/dl (ref 70–140)
Potassium: 3.2 mEq/L — ABNORMAL LOW (ref 3.5–5.1)
SODIUM: 140 meq/L (ref 136–145)
Total Bilirubin: <0.3 mg/dL (ref 0.20–1.20)
Total Protein: 7 g/dL (ref 6.4–8.3)

## 2015-12-21 MED ORDER — IMATINIB MESYLATE 400 MG PO TABS: 400.0000 mg | ORAL_TABLET | Freq: Every day | ORAL | 6 refills | Status: DC

## 2015-12-21 NOTE — Progress Notes (Signed)
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 Follow-up  : White blood cell count is elevated.  HPI: Kristina Meyer is a very charming 75 year old white female. She has been in great health. She does went for a routine physical. She has some lab work done. Surprisingly enough, the lab work showed that her white cell count was elevated. This was back in early July. Her white 6, 17,000. She was found to have some Escherichia coli in her urine. She is slightly anemic with a hemoglobin 11.3. Her platelet count was 163,000.  Her white cell count apparently going up. I don't have those results. She was told that she had to see a hematologist. As such, she was kindly referred to the [facility].  She has had no palpable lymph nodes. She's had no abdominal pain. She's had no fever. There's been no weight loss or weight gain. She has noted some small bruises on her legs.  There apparently is a history of leukemia in the family. She's not sure as to what kind of leukemia that is.  She has not had any problems with fever. She has had no issues with bowels or bladder. She gets her mammograms routinely.  She's had no past surgery.  She does not smoke. She smoked maybe 15 years ago for 4 years. She really does not have alcohol beverages.  There's been no headache. There is no swallowing problems. She's had no visual issues.  She's had no change in medications.  Overall, her performance status is ECOG 0    Past Medical History:  Diagnosis Date  . Anxiety   . Heart murmur    echo done 20 yrs ago  normal  . PONV (postoperative nausea and vomiting)    req patch  :  Past Surgical History:  Procedure Laterality Date  . BREAST SURGERY Left 76   lump   . EYE SURGERY Bilateral 09   cataracts  . HEMORRHOID SURGERY  85  . LUMBAR LAMINECTOMY/DECOMPRESSION MICRODISCECTOMY Right 12/02/2013   Procedure: L5-S1 RIGHT  DISCECTOMY;  Surgeon: Melina Schools, MD;  Location: [facility];  Service: Orthopedics;  Laterality: Right;  :   Current Outpatient Prescriptions:  .  ALPRAZolam (XANAX) 0.5 MG tablet, Take by mouth., Disp: , Rfl:  .  aspirin 81 MG tablet, Take by mouth., Disp: , Rfl:  .  atorvastatin (LIPITOR) 20 MG tablet, Take 10 mg by mouth at bedtime., Disp: , Rfl:  .  Calcium-Magnesium-Vitamin D (CALCIUM 1200+D3 PO), Take 1 tablet by mouth daily., Disp: , Rfl:  .  cholecalciferol (VITAMIN D) 1000 UNITS tablet, Take 1,000 Units by mouth daily., Disp: , Rfl:  .  [START ON 12/08/2016] hydrochlorothiazide (HYDRODIURIL) 25 MG tablet, Take by mouth., Disp: , Rfl:  .  PARoxetine (PAXIL) 20 MG tablet, Take 20 mg by mouth daily., Disp: , Rfl:  .  sulfamethoxazole-trimethoprim (BACTRIM DS) 800-160 MG per tablet, Take 1 tablet by mouth daily., Disp: , Rfl:  .  imatinib (GLEEVEC) 400 MG tablet, Take 1 tablet (400 mg total) by mouth daily. Take with meals and large glass of water.Caution:Chemotherapy., Disp: 30 tablet, Rfl: 6:  :  Allergies  Allergen Reactions  . Other Other (See Comments)    GENERAL Anesthesia, vomiting  :  No family history on file.:  Social History   Social History  . Marital status: Married    Spouse name: N/A  . Number of children: N/A  .  Years of education: N/A   Occupational History  . Not on file.   Social History Main Topics  . Smoking status: Former Smoker    Packs/day: 0.50    Years: 5.00    Types: Cigarettes    Quit date: 12/02/1966  . Smokeless tobacco: Never Used  . Alcohol use No  . Drug use: No  . Sexual activity: Not on file   Other Topics Concern  . Not on file   Social History Narrative  . No narrative on file  :  Pertinent items are noted in HPI.  Exam: @IPVITALS @ Well-developed and well-nourished white female in no obvious distress. Vital signs show a temperature of 97.8. Pulse 81. Blood pressure 139/54. Weight is143 pounds. Head and neck exam shows no  ocular or oral lesions. She has no scleral icterus. She has no adenopathy in the neck. Lungs are clear to percussion and auscultation bilaterally. Cardiac exam regular rate and rhythm with no murmurs, rubs or bruits. Abdomen is soft. She has good bowel sounds. There is no fluid wave. There is no palpable liver or spleen tip. Back exam shows no tenderness over the spine, ribs or hips. Extremities shows no clubbing, cyanosis or edema. She has good range motion of her joints. Skin exam shows small wildly scattered ecchymoses. Neurological exam shows no focal neurological deficits.    Recent Labs  12/21/15 1400  WBC 41.2*  HGB 10.3*  HCT 32.4*  PLT 125*    Recent Labs  12/21/15 1401  NA 140  K 3.2*  CO2 29  GLUCOSE 116  BUN 21.5  CREATININE 1.4*  CALCIUM 9.6    Blood smear review:  Normochromic and normocytic papillation of red blood cells. I see no teardrop cells. She has no nucleated red blood cells. I see no target cells. There is no rouleau formation. White cells are markedly increased in number. She has a clear left shift. She has myelocytes and metamyelocytes. She has a few promyelocytes. There maybe a couple blasts. She has an increase in monocytes. There maybe a few immature lymphoid cells. Platelets are minimally decreased in number.  Pathology: None     Assessment and Plan:  Kristina Meyer is a very charming 75 year old white female with leukocytosis. 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 On her physical exam, I cannot feel an enlarged spleen. She had no lymphadenopathy. On the blood smear I do not see any nucleated red blood cells that would suggest myelofibrosis.  Given that I am quite confident that this is CML, I will put her on [place]. I think at her age, Gleevec would be a very reasonable initial therapy for CML.  I would like to hope that we can hold off on a bone marrow biopsy on her. I'm not sure what this is going to  offer [place] right now. I think the peripheral blood for BCR/ABL would be enough.  I think if the BCR/ABL is normal, then we may have to do a bone marrow biopsy.  I spent about an hour with she and her husband. They're both very nice. I reassured her that much as I could. I really believe that if she has CML, that [place] will have a 95% chance of getting her into a molecular remission.  I did give her a prayer blanket. This made her feel a lot better.  I will like to see her back in another 3 weeks.  Pete E.

## 2015-12-22 LAB — LACTATE DEHYDROGENASE: LDH: 419 U/L — ABNORMAL HIGH (ref 125–245)

## 2015-12-26 ENCOUNTER — Other Ambulatory Visit: Payer: Self-pay | Admitting: Nurse Practitioner

## 2015-12-26 DIAGNOSIS — C9210 Chronic myeloid leukemia, BCR/ABL-positive, not having achieved remission: Secondary | ICD-10-CM

## 2015-12-26 DIAGNOSIS — D72829 Elevated white blood cell count, unspecified: Secondary | ICD-10-CM

## 2015-12-26 MED ORDER — ALPRAZOLAM 0.5 MG PO TABS: 0.5000 mg | ORAL_TABLET | Freq: Three times a day (TID) | ORAL | 2 refills | Status: DC | PRN

## 2015-12-28 ENCOUNTER — Ambulatory Visit: Payer: Self-pay | Admitting: Hematology & Oncology

## 2015-12-28 ENCOUNTER — Other Ambulatory Visit: Payer: Self-pay

## 2015-12-28 ENCOUNTER — Ambulatory Visit: Payer: Self-pay

## 2015-12-29 ENCOUNTER — Ambulatory Visit (HOSPITAL_BASED_OUTPATIENT_CLINIC_OR_DEPARTMENT_OTHER): Payer: Medicare Other | Admitting: Hematology & Oncology

## 2015-12-29 ENCOUNTER — Other Ambulatory Visit (HOSPITAL_BASED_OUTPATIENT_CLINIC_OR_DEPARTMENT_OTHER): Payer: Medicare Other

## 2015-12-29 VITALS — BP 119/62 | HR 102 | Temp 97.9°F | Resp 20 | Ht 62.5 in | Wt 140.0 lb

## 2015-12-29 DIAGNOSIS — D72829 Elevated white blood cell count, unspecified: Secondary | ICD-10-CM | POA: Diagnosis not present

## 2015-12-29 DIAGNOSIS — C9210 Chronic myeloid leukemia, BCR/ABL-positive, not having achieved remission: Secondary | ICD-10-CM

## 2015-12-29 DIAGNOSIS — C9310 Chronic myelomonocytic leukemia not having achieved remission: Secondary | ICD-10-CM

## 2015-12-29 LAB — MANUAL DIFFERENTIAL (CHCC SATELLITE)
ALC: 12.3 10*3/uL — ABNORMAL HIGH (ref 0.6–2.2)
ANC (CHCC HP manual diff): 16.1 10*3/uL — ABNORMAL HIGH (ref 1.5–6.7)
Eos: 1 % (ref 0–7)
LYMPH: 26 % (ref 14–48)
MONO: 39 % — ABNORMAL HIGH (ref 0–13)
MYELOCYTES: 3 % — AB (ref 0–0)
Metamyelocytes: 1 % — ABNORMAL HIGH (ref 0–0)
PLATELET MORPHOLOGY: NORMAL
PLT EST ~~LOC~~: DECREASED
SEG: 30 % — AB (ref 40–75)
nRBC: 1 % — ABNORMAL HIGH (ref 0–0)

## 2015-12-29 LAB — CBC WITH DIFFERENTIAL (CANCER CENTER ONLY)
HCT: 32.3 % — ABNORMAL LOW (ref 34.8–46.6)
HGB: 10.2 g/dL — ABNORMAL LOW (ref 11.6–15.9)
MCH: 33.4 pg (ref 26.0–34.0)
MCHC: 31.6 g/dL — AB (ref 32.0–36.0)
MCV: 106 fL — AB (ref 81–101)
PLATELETS: 87 10*3/uL — AB (ref 145–400)
RBC: 3.05 10*6/uL — ABNORMAL LOW (ref 3.70–5.32)
RDW: 15.1 % (ref 11.1–15.7)
WBC: 47.4 10*3/uL — ABNORMAL HIGH (ref 3.9–10.0)

## 2015-12-29 LAB — CHCC SATELLITE - SMEAR

## 2015-12-29 MED ORDER — HYDROXYUREA 500 MG PO CAPS: ORAL_CAPSULE | ORAL | 4 refills | Status: DC

## 2015-12-29 MED ORDER — ORPHENADRINE CITRATE ER 100 MG PO TB12: 100.0000 mg | ORAL_TABLET | Freq: Every evening | ORAL | 2 refills | Status: DC | PRN

## 2015-12-29 NOTE — Progress Notes (Signed)
Hematology and Oncology Follow Up Visit  Kristina Meyer MW:[id number] 02/28/1941 75 y.o. 12/29/2015   Principle Diagnosis:   Chronic Myelomonocytic Leukemia (CMMoL)  Current Therapy:    Hydrea 1000 mg po q day     Interim History:  Kristina Meyer is back for a early visit. Unfortunately, I do not believe that she has chronic myeloid leukemia. When I first saw her a week ago, I really thought she had chronic myeloid leukemia. Her blood smear was very consistent with this. However, her BCR / ABL analysis was negative.  As always,God is in control, she never got the [place] filled. It was going to cost her about $5000 a month. Amazingly, we got it approved today. She would not pay anything area did  She's having some night sweats. She mostly is having leg pain. This is pain in her thighs. I suspect this probably is bone marrow enlargement from this underlying process.  She has had no fever. She has had no bleeding. She's had some small ecchymoses.  His been no diarrhea. She's had no urinary issues.  She's had no nausea. She's had no cough. She does get tired easily.  Overall, her performance status is ECOG 1.  Medications:  Current Outpatient Prescriptions:  .  ALPRAZolam (XANAX) 0.5 MG tablet, Take 1 tablet (0.5 mg total) by mouth 3 (three) times daily as needed for anxiety., Disp: 60 tablet, Rfl: 2 .  aspirin 81 MG tablet, Take by mouth., Disp: , Rfl:  .  atorvastatin (LIPITOR) 20 MG tablet, Take 10 mg by mouth at bedtime., Disp: , Rfl:  .  Calcium-Magnesium-Vitamin D (CALCIUM 1200+D3 PO), Take 1 tablet by mouth daily., Disp: , Rfl:  .  cholecalciferol (VITAMIN D) 1000 UNITS tablet, Take 1,000 Units by mouth daily., Disp: , Rfl:  .  [START ON 12/08/2016] hydrochlorothiazide (HYDRODIURIL) 25 MG tablet, Take by mouth., Disp: , Rfl:  .  PARoxetine (PAXIL) 20 MG tablet, Take 20 mg by mouth daily., Disp: , Rfl:  .  sulfamethoxazole-trimethoprim (BACTRIM DS) 800-160 MG per tablet, Take 1 tablet by  mouth daily., Disp: , Rfl:  .  hydroxyurea (HYDREA) 500 MG capsule, Take 1 pill twice a day. May take with food to minimize GI side effects., Disp: 60 capsule, Rfl: 4 .  orphenadrine (NORFLEX) 100 MG tablet, Take 1 tablet (100 mg total) by mouth at bedtime as needed for muscle spasms., Disp: 30 tablet, Rfl: 2  Allergies:  Allergies  Allergen Reactions  . Other Other (See Comments)    GENERAL Anesthesia, vomiting    Past Medical History, Surgical history, Social history, and Family History were reviewed and updated.  Review of Systems:  As above  Physical Exam:  height is 5' 2.5" (1.588 m) and weight is 140 lb (63.5 kg). Her oral temperature is 97.9 F (36.6 C). Her blood pressure is 119/62 and her pulse is 102 (abnormal). Her respiration is 20.   Wt Readings from Last 3 Encounters:  12/29/15 140 lb (63.5 kg)  12/21/15 143 lb (64.9 kg)  12/02/13 141 lb (64 kg)      Head and neck exam shows no ocular or oral lesions. She has no scleral icterus. She has no adenopathy in the neck. Lungs are clear to percussion and auscultation bilaterally. Cardiac exam regular rate and rhythm with no murmurs, rubs or bruits. Abdomen is soft. She has good bowel sounds. There is no fluid wave. There is no palpable liver or spleen tip. Back exam shows no tenderness over the  spine, ribs or hips. Extremities shows no clubbing, cyanosis or edema. She has good range motion of her joints. Skin exam shows small wildly scattered ecchymoses. Neurological exam shows no focal neurological deficits  Lab Results  Component Value Date   WBC 47.4 (H) 12/29/2015   HGB 10.2 (L) 12/29/2015   HCT 32.3 (L) 12/29/2015   MCV 106 (H) 12/29/2015   PLT 87 (L) 12/29/2015     Chemistry      Component Value Date/Time   NA 140 12/21/2015 1401   K 3.2 (L) 12/21/2015 1401   CL 103 12/01/2013 1014   CO2 29 12/21/2015 1401   BUN 21.5 12/21/2015 1401   CREATININE 1.4 (H) 12/21/2015 1401      Component Value Date/Time    CALCIUM 9.6 12/21/2015 1401   ALKPHOS 68 12/21/2015 1401   AST 36 (H) 12/21/2015 1401   ALT 26 12/21/2015 1401   BILITOT <0.30 12/21/2015 1401         Impression and Plan: Kristina Meyer is a 75 year old white female. She has marked leukocytosis. She has a clear left shift.  I think that we may be actually looking at chronic myelomonocytic leukemia. This is one of the new MDS/MPN hybrid diseases.  We're going to have to do a bone marrow test on her now. I talked her about this. I explained to her what I thought was going on. I went her why I thought a bone marrow test would be necessary. She understands. I will do this on August 8.  I am going to start her on Hydrea today. I think this will be helpful to try to help slow down her bone marrow. Hopefully, we can start getting her white cell count down a little bit.  I am sending off her blood for other genetic markers. I'm sending it off for the MPN panel. I would have to believe that she'll have some genetic mutation that is driving this.  I spent about 45 minutes with her. This is a complex issue. Again I'm just surprised that she does not have chronic myeloid leukemia which clearly is a lot easier to treat.  We will have to get lab work on her weekly. I want to see her back in 2 weeks.   Volanda Napoleon, MD 8/3/20175:18 PM

## 2016-01-03 ENCOUNTER — Encounter (HOSPITAL_COMMUNITY): Payer: Self-pay

## 2016-01-03 ENCOUNTER — Ambulatory Visit (HOSPITAL_COMMUNITY)
Admission: RE | Admit: 2016-01-03 | Discharge: 2016-01-03 | Disposition: A | Discharge status: Home / Self Care | Payer: Medicare Other | Source: Ambulatory Visit | Attending: Hematology & Oncology | Admitting: Hematology & Oncology

## 2016-01-03 DIAGNOSIS — C92Z0 Other myeloid leukemia not having achieved remission: Secondary | ICD-10-CM | POA: Insufficient documentation

## 2016-01-03 DIAGNOSIS — D72829 Elevated white blood cell count, unspecified: Secondary | ICD-10-CM

## 2016-01-03 DIAGNOSIS — C9310 Chronic myelomonocytic leukemia not having achieved remission: Secondary | ICD-10-CM

## 2016-01-03 DIAGNOSIS — D539 Nutritional anemia, unspecified: Secondary | ICD-10-CM | POA: Diagnosis not present

## 2016-01-03 DIAGNOSIS — D696 Thrombocytopenia, unspecified: Secondary | ICD-10-CM | POA: Insufficient documentation

## 2016-01-03 HISTORY — DX: Essential (primary) hypertension: I10

## 2016-01-03 LAB — CBC WITH DIFFERENTIAL/PLATELET
BAND NEUTROPHILS: 0 %
BASOS ABS: 0 10*3/uL (ref 0.0–0.1)
BLASTS: 9 %
Basophils Relative: 0 %
EOS ABS: 0.1 10*3/uL (ref 0.0–0.7)
Eosinophils Relative: 1 %
HEMATOCRIT: 29.9 % — AB (ref 36.0–46.0)
Hemoglobin: 9.5 g/dL — ABNORMAL LOW (ref 12.0–15.0)
LYMPHS ABS: 3.4 10*3/uL (ref 0.7–4.0)
Lymphocytes Relative: 23 %
MCH: 32.5 pg (ref 26.0–34.0)
MCHC: 31.8 g/dL (ref 30.0–36.0)
MCV: 102.4 fL — ABNORMAL HIGH (ref 78.0–100.0)
METAMYELOCYTES PCT: 0 %
MONOS PCT: 33 %
MYELOCYTES: 1 %
Monocytes Absolute: 4.9 10*3/uL — ABNORMAL HIGH (ref 0.1–1.0)
NEUTROS ABS: 5.1 10*3/uL (ref 1.7–7.7)
Neutrophils Relative %: 33 %
Other: 0 %
PLATELETS: 88 10*3/uL — AB (ref 150–400)
Promyelocytes Absolute: 0 %
RBC: 2.92 MIL/uL — ABNORMAL LOW (ref 3.87–5.11)
RDW: 14.8 % (ref 11.5–15.5)
WBC: 14.9 10*3/uL — ABNORMAL HIGH (ref 4.0–10.5)
nRBC: 0 /100 WBC

## 2016-01-03 LAB — BONE MARROW EXAM

## 2016-01-03 MED ORDER — MIDAZOLAM HCL 5 MG/ML IJ SOLN
4.0000 mg | Freq: Once | INTRAMUSCULAR | Status: DC
  Filled 2016-01-03: qty 1

## 2016-01-03 MED ORDER — MIDAZOLAM HCL 5 MG/5ML IJ SOLN
INTRAMUSCULAR | Status: AC | PRN
  Administered 2016-01-03: 2.5 mg via INTRAVENOUS

## 2016-01-03 MED ORDER — SODIUM CHLORIDE 0.9 % IV SOLN
Freq: Once | INTRAVENOUS | Status: AC
  Administered 2016-01-03: 07:00:00 via INTRAVENOUS

## 2016-01-03 MED ORDER — MEPERIDINE HCL 25 MG/ML IJ SOLN
INTRAMUSCULAR | Status: AC | PRN
Start: 2016-01-03 — End: 2016-01-03
  Administered 2016-01-03: 12.5 mg via INTRAVENOUS

## 2016-01-03 MED ORDER — MEPERIDINE HCL 50 MG/ML IJ SOLN
25.0000 mg | Freq: Once | INTRAMUSCULAR | Status: DC
  Filled 2016-01-03: qty 1

## 2016-01-03 NOTE — Sedation Documentation (Signed)
Dressing CDI 

## 2016-01-03 NOTE — Sedation Documentation (Signed)
Medication dose calculated and verified for: Demerol 12.5 mg IV and Versed 2.5mg  IV

## 2016-01-03 NOTE — Sedation Documentation (Signed)
Patient is resting comfortably. 

## 2016-01-03 NOTE — Sedation Documentation (Signed)
Dressing remains C/D/I.

## 2016-01-03 NOTE — Procedures (Signed)
This is a bone marrow biopsy and aspirate note for Kristina Meyer. She was brought to the short stay unit at [facility]. She had an IV placed peripherally.  We did the appropriate timeout procedure at 7:50AM.  Her Mallimpati score is 2. Her ASA class is 2.  She was placed onto her right side. She received a total of 2.5 mg of Versed and 12.5 mg of Demerol for IV sedation.  The left posterior iliac crest region was prepped and draped in sterile fashion.  4 mL of 1% lidocaine wasn't located under the skin down to the periosteum.  I use the combination biopsy and aspirate needle. I obtained 2 aspirates initially. This was done without difficulty. One aspirate was sent off for flow cytometry and cytogenetics.  I then obtained an excellent bone marrow biopsy core. This was 1.7 cm.  I then cleaned and dressed the procedure site sterilely.  There were no complications. She tolerated all this very well.  I then got her husband. I brought him back to the treatment room.  I told him that I would call her on Thursday or Friday with the results.  I also told him to make sure she decreases the Hydrea dose to 500 mg a day. He understood this.  Pete E.  Jeremiah 29:11 

## 2016-01-03 NOTE — Sedation Documentation (Signed)
Ambulated to BR without assist and voided. Dressing remains CDI

## 2016-01-03 NOTE — Sedation Documentation (Signed)
Procedure ends dressing to post iliac area with folded 4x4 and hypafix tape. Pt placed supine for pressure on site

## 2016-01-03 NOTE — Discharge Instructions (Signed)
Do not drive  For 24 hours °Do not go into public places today °May resume your regular diet and take home medications as usual °May experience small amount of tingling in leg (biopsy side) °May take shower and remove bandage in am °For any questions or concerns, call Dr Ennever °If bleeding occurs at site, hold pressure x10 minutes  If continues, call Dr Ennever °Bone Marrow Aspiration and Bone Marrow Biopsy, Care After °Refer to this sheet in the next few weeks. These instructions provide you with information about caring for yourself after your procedure. Your health care provider may also give you more specific instructions. Your treatment has been planned according to current medical practices, but problems sometimes occur. Call your health care provider if you have any problems or questions after your procedure. °WHAT TO EXPECT AFTER THE PROCEDURE °After your procedure, it is common to have: °· Soreness or tenderness around the puncture site. °· Bruising. °HOME CARE INSTRUCTIONS °· Take medicines only as directed by your health care provider. °· Follow your health care provider's instructions about: °¨ Puncture site care. °¨ Bandage (dressing) changes and removal. °· Bathe and shower as directed by your health care provider. °· Check your puncture site every day for signs of infection. Watch for: °¨ Redness, swelling, or pain. °¨ Fluid, blood, or pus. °· Return to your normal activities as directed by your health care provider. °· Keep all follow-up visits as directed by your health care provider. This is important. °SEEK MEDICAL CARE IF: °· You have a fever. °· You have uncontrollable bleeding. °· You have redness, swelling, or pain at the site of your puncture. °· You have fluid, blood, or pus coming from your puncture site. °  °This information is not intended to replace advice given to you by your health care provider. Make sure you discuss any questions you have with your health care provider. °    °Document Released: 12/01/2004 Document Revised: 09/28/2014 Document Reviewed: 05/05/2014 °Elsevier Interactive Patient Education ©2016 [employer] °Moderate Conscious Sedation, Adult, Care After °Refer to this sheet in the next few weeks. These instructions provide you with information on caring for yourself after your procedure. Your health care provider may also give you more specific instructions. Your treatment has been planned according to current medical practices, but problems sometimes occur. Call your health care provider if you have any problems or questions after your procedure. °WHAT TO EXPECT AFTER THE PROCEDURE  °After your procedure: °· You may feel sleepy, clumsy, and have poor balance for several hours. °· Vomiting may occur if you eat too soon after the procedure. °HOME CARE INSTRUCTIONS °· Do not participate in any activities where you could become injured for at least 24 hours. Do not: °¨ Drive. °¨ Swim. °¨ Ride a bicycle. °¨ Operate heavy machinery. °¨ Cook. °¨ Use power tools. °¨ Climb ladders. °¨ Work from a high place. °· Do not make important decisions or sign legal documents until you are improved. °· If you vomit, drink water, juice, or soup when you can drink without vomiting. Make sure you have little or no nausea before eating solid foods. °· Only take over-the-counter or prescription medicines for pain, discomfort, or fever as directed by your health care provider. °· Make sure you and your family fully understand everything about the medicines given to you, including what side effects may occur. °· You should not drink alcohol, take sleeping pills, or take medicines that cause drowsiness for at least 24 hours. °·   If you smoke, do not smoke without supervision. °· If you are feeling better, you may resume normal activities 24 hours after you were sedated. °· Keep all appointments with your health care provider. °SEEK MEDICAL CARE IF: °· Your skin is pale or bluish in color. °· You  continue to feel nauseous or vomit. °· Your pain is getting worse and is not helped by medicine. °· You have bleeding or swelling. °· You are still sleepy or feeling clumsy after 24 hours. °SEEK IMMEDIATE MEDICAL CARE IF: °· You develop a rash. °· You have difficulty breathing. °· You develop any type of allergic problem. °· You have a fever. °MAKE SURE YOU: °· Understand these instructions. °· Will watch your condition. °· Will get help right away if you are not doing well or get worse. °  °This information is not intended to replace advice given to you by your health care provider. Make sure you discuss any questions you have with your health care provider. °  °Document Released: 03/04/2013 Document Revised: 06/04/2014 Document Reviewed: 03/04/2013 °Elsevier Interactive Patient Education ©2016 [employer] Inc. °. ° °

## 2016-01-03 NOTE — Sedation Documentation (Signed)
Family updated as to patient's status.

## 2016-01-05 ENCOUNTER — Other Ambulatory Visit (HOSPITAL_BASED_OUTPATIENT_CLINIC_OR_DEPARTMENT_OTHER): Payer: Medicare Other

## 2016-01-05 ENCOUNTER — Encounter: Payer: Self-pay | Admitting: Hematology & Oncology

## 2016-01-05 ENCOUNTER — Ambulatory Visit (HOSPITAL_COMMUNITY)
Admission: RE | Admit: 2016-01-05 | Discharge: 2016-01-05 | Disposition: A | Discharge status: Home / Self Care | Payer: Medicare Other | Source: Ambulatory Visit | Attending: Hematology & Oncology | Admitting: Hematology & Oncology

## 2016-01-05 ENCOUNTER — Ambulatory Visit (HOSPITAL_BASED_OUTPATIENT_CLINIC_OR_DEPARTMENT_OTHER): Payer: Medicare Other | Admitting: Hematology & Oncology

## 2016-01-05 ENCOUNTER — Other Ambulatory Visit: Payer: Self-pay | Admitting: Hematology & Oncology

## 2016-01-05 VITALS — BP 125/50 | HR 92 | Temp 97.7°F | Resp 18 | Ht 62.5 in | Wt 137.0 lb

## 2016-01-05 DIAGNOSIS — C9300 Acute monoblastic/monocytic leukemia, not having achieved remission: Secondary | ICD-10-CM | POA: Diagnosis not present

## 2016-01-05 DIAGNOSIS — C9310 Chronic myelomonocytic leukemia not having achieved remission: Secondary | ICD-10-CM

## 2016-01-05 DIAGNOSIS — C92Z0 Other myeloid leukemia not having achieved remission: Secondary | ICD-10-CM | POA: Diagnosis not present

## 2016-01-05 DIAGNOSIS — C9210 Chronic myeloid leukemia, BCR/ABL-positive, not having achieved remission: Secondary | ICD-10-CM | POA: Diagnosis not present

## 2016-01-05 DIAGNOSIS — D72829 Elevated white blood cell count, unspecified: Secondary | ICD-10-CM

## 2016-01-05 HISTORY — DX: Acute monoblastic/monocytic leukemia, not having achieved remission: C93.00

## 2016-01-05 LAB — MANUAL DIFFERENTIAL (CHCC SATELLITE)
ALC: 1.8 10*3/uL (ref 0.6–2.2)
ANC (CHCC MAN DIFF): 1 10*3/uL — AB (ref 1.5–6.7)
BASO: 1 % (ref 0–2)
Blasts: 4 % — ABNORMAL HIGH (ref 0–0)
LYMPH: 45 % (ref 14–48)
MONO: 24 % — ABNORMAL HIGH (ref 0–13)
MYELOCYTES: 1 % — AB (ref 0–0)
PLT EST ~~LOC~~: DECREASED
SEG: 25 % — AB (ref 40–75)

## 2016-01-05 LAB — COMPREHENSIVE METABOLIC PANEL (CC13)
A/G RATIO: 1.6 (ref 1.2–2.2)
ALBUMIN: 4.1 g/dL (ref 3.5–4.8)
ALT: 32 IU/L (ref 0–32)
AST (SGOT): 31 IU/L (ref 0–40)
Alkaline Phosphatase, S: 60 IU/L (ref 39–117)
BILIRUBIN TOTAL: 0.4 mg/dL (ref 0.0–1.2)
BUN / CREAT RATIO: 26 (ref 12–28)
BUN: 34 mg/dL — ABNORMAL HIGH (ref 8–27)
CALCIUM: 9.5 mg/dL (ref 8.7–10.3)
CREATININE: 1.33 mg/dL — AB (ref 0.57–1.00)
Carbon Dioxide, Total: 27 mmol/L (ref 18–29)
Chloride, Ser: 100 mmol/L (ref 96–106)
GFR calc Af Amer: 44 mL/min/{1.73_m2} — ABNORMAL LOW (ref >59)
GFR, EST NON AFRICAN AMERICAN: 39 mL/min/{1.73_m2} — AB (ref >59)
GLOBULIN, TOTAL: 2.5 g/dL (ref 1.5–4.5)
Glucose: 127 mg/dL — ABNORMAL HIGH (ref 65–99)
Potassium, Ser: 3.3 mmol/L — ABNORMAL LOW (ref 3.5–5.2)
SODIUM: 136 mmol/L (ref 134–144)
TOTAL PROTEIN: 6.6 g/dL (ref 6.0–8.5)

## 2016-01-05 LAB — ABO/RH: ABO/RH(D): A POS

## 2016-01-05 LAB — CBC WITH DIFFERENTIAL (CANCER CENTER ONLY)
HEMATOCRIT: 26.4 % — AB (ref 34.8–46.6)
HEMOGLOBIN: 8.7 g/dL — AB (ref 11.6–15.9)
MCH: 34.4 pg — AB (ref 26.0–34.0)
MCHC: 33 g/dL (ref 32.0–36.0)
MCV: 104 fL — AB (ref 81–101)
Platelets: 91 10*3/uL — ABNORMAL LOW (ref 145–400)
RBC: 2.53 10*6/uL — AB (ref 3.70–5.32)
RDW: 13.9 % (ref 11.1–15.7)
WBC: 4 10*3/uL (ref 3.9–10.0)

## 2016-01-05 LAB — CHCC SATELLITE - SMEAR

## 2016-01-05 MED ORDER — PROCHLORPERAZINE MALEATE 5 MG PO TABS: 5.0000 mg | ORAL_TABLET | Freq: Four times a day (QID) | ORAL | 0 refills | Status: DC | PRN

## 2016-01-05 MED ORDER — ALLOPURINOL 100 MG PO TABS: 100.0000 mg | ORAL_TABLET | Freq: Every day | ORAL | 1 refills | Status: DC

## 2016-01-05 NOTE — Progress Notes (Signed)
Hematology and Oncology Follow Up Visit  Kristina Meyer [id number] 1941/01/09 75 y.o. 01/05/2016   Principle Diagnosis:   AML - monocytic  Current Therapy:    To start Dacogen next week - 7 day     Interim History:  Kristina Meyer is back for follow-up. I did a bone marrow biopsy on Kristina Meyer this past Tuesday. I was called by the pathologist today. 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  I'm absolutely shocked by this. Whenever we have looked at Kristina Meyer blood smear, we never saw any leukemic cells. However, he says of the bone marrow is quite infiltrated by leukemic blasts.  I currently have Kristina Meyer on Hydrea at 500 mg a day. This is helped bring Kristina Meyer white cell count down quite nicely.  Kristina Meyer is complaining of some weakness. Kristina Meyer has some nausea. The nausea might be from leukemic infiltration into Kristina Meyer GI system.  Kristina Meyer hemoglobin is 8.7. I really think that Kristina Meyer is going to need a transfusion. I talked to Kristina Meyer about a transfusion. I explained to Kristina Meyer why I thought Kristina Meyer would need this. Kristina Meyer understands this very well. Kristina Meyer agrees to have the transfusion. I think this will make Kristina Meyer feel better.  We do not have back the cytogenetics on the bone marrow yet area did this probably will not be back until next week.  Kristina Meyer has not had any fever. Kristina Meyer's had no bleeding. Kristina Meyer's had no cough or shortness of breath. Kristina Meyer appetite is down a little bit.  I did go ahead and send in a prescription for Compazine for Kristina Meyer. This is 5 mg as needed every 6 hours.  Kristina Meyer has had no bruising.  Overall, Kristina Meyer performance status is probably ECOG 1-2.  Medications:  Current Outpatient Prescriptions:  .  ALPRAZolam (XANAX) 0.5 MG tablet, Take 1 tablet (0.5 mg total) by mouth 3 (three) times daily as needed for anxiety., Disp: 60 tablet, Rfl: 2 .  aspirin 81 MG tablet, Take by mouth., Disp: , Rfl:  .  atorvastatin (LIPITOR) 20 MG tablet, Take 10 mg by mouth at bedtime., Disp: , Rfl:  .   Calcium-Magnesium-Vitamin D (CALCIUM 1200+D3 PO), Take 1 tablet by mouth daily., Disp: , Rfl:  .  cholecalciferol (VITAMIN D) 1000 UNITS tablet, Take 1,000 Units by mouth daily., Disp: , Rfl:  .  [START ON 12/08/2016] hydrochlorothiazide (HYDRODIURIL) 25 MG tablet, Take by mouth., Disp: , Rfl:  .  Multiple Vitamin (MULTIVITAMIN) LIQD, Take 5 mLs by mouth daily., Disp: , Rfl:  .  Multiple Vitamins-Minerals (MULTIVITAMIN ADULT) TABS, Take 1 tablet by mouth daily., Disp: , Rfl:  .  orphenadrine (NORFLEX) 100 MG tablet, Take 1 tablet (100 mg total) by mouth at bedtime as needed for muscle spasms., Disp: 30 tablet, Rfl: 2 .  PARoxetine (PAXIL) 20 MG tablet, Take 20 mg by mouth daily., Disp: , Rfl:  .  sulfamethoxazole-trimethoprim (BACTRIM DS) 800-160 MG per tablet, Take 1 tablet by mouth daily., Disp: , Rfl:  .  allopurinol (ZYLOPRIM) 100 MG tablet, Take 1 tablet (100 mg total) by mouth daily., Disp: 30 tablet, Rfl: 1 .  prochlorperazine (COMPAZINE) 5 MG tablet, Take 1 tablet (5 mg total) by mouth every 6 (six) hours as needed for nausea or vomiting., Disp: 60 tablet, Rfl: 0  Allergies:  Allergies  Allergen Reactions  . Other Other (See Comments)    GENERAL Anesthesia, vomiting    Past Medical History, Surgical history, Social history, and Family History were reviewed  and updated.  Review of Systems:  As above  Physical Exam:  height is 5' 2.5" (1.588 m) and weight is 137 lb (62.1 kg). Kristina Meyer oral temperature is 97.7 F (36.5 C). Kristina Meyer blood pressure is 125/50 (abnormal) and Kristina Meyer pulse is 92. Kristina Meyer respiration is 18.   Wt Readings from Last 3 Encounters:  01/05/16 137 lb (62.1 kg)  01/03/16 140 lb (63.5 kg)  12/29/15 140 lb (63.5 kg)      Slightly elderly appearing white female in no obvious distress. Head and neck exam shows no ocular or oral lesions. There are no palpable cervical or supraclavicular lymph nodes. Lungs are clear bilaterally. Cardiac exam tachycardic but regular. Kristina Meyer has no  murmurs, rubs or bruits. Abdomen is soft. Kristina Meyer has good bowel sounds. There is no fluid wave. There is no palpable liver or spleen tip. Back exam shows no tenderness over the spine, ribs or hips. Extremities shows no clubbing, cyanosis or edema. Neurological exam shows no focal neurological deficits. Skin exam shows no ecchymoses or petechia.  Lab Results  Component Value Date   WBC 4.0 01/05/2016   HGB 8.7 (L) 01/05/2016   HCT 26.4 (L) 01/05/2016   MCV 104 (H) 01/05/2016   PLT 91 (L) 01/05/2016     Chemistry      Component Value Date/Time   NA 140 12/21/2015 1401   K 3.2 (L) 12/21/2015 1401   CL 103 12/01/2013 1014   CO2 29 12/21/2015 1401   BUN 21.5 12/21/2015 1401   CREATININE 1.4 (H) 12/21/2015 1401      Component Value Date/Time   CALCIUM 9.6 12/21/2015 1401   ALKPHOS 68 12/21/2015 1401   AST 36 (H) 12/21/2015 1401   ALT 26 12/21/2015 1401   BILITOT <0.30 12/21/2015 1401         Impression and Plan: Kristina Meyer is  A 75 year old white female. Kristina Meyer shockingly enough has acute myeloid leukemia. Again I am absolutely stunned by this because Kristina Meyer blood smear never showed [place] myeloblasts.  I think that Kristina Meyer best option is going to be using Dacogen. This does have a fairly good track record with leukemia. I think Kristina Meyer could tolerate this.  I talked to Kristina Meyer and Kristina Meyer about this for about 45 minutes. Again I'm just very disappointed that Kristina Meyer has this diagnosis.  I told Kristina Meyer to stop the Hydrea. I do not want see Kristina Meyer blood counts Kristina Meyer lower.  I think that Kristina Meyer will need to be transfused. Kristina Meyer will get 2 units of blood on the 11th.  I did talk to Kristina Meyer about getting a Port-A-Cath placed. I did this will make life a lot easier for Kristina Meyer if we could get this and be able to use it for treatment.  As far as the Dacogen protocol, I don't think that Kristina Meyer can tolerate 10 days of Dacogen. I think we could start off with 7 days. I think that this will certainly make Kristina Meyer pancytopenic.  I will have  to have Kristina Meyer labs checked weekly while Kristina Meyer is on treatment. I told Kristina Meyer that it would not surprise me if Kristina Meyer needed to be transfused when we first started therapy on Kristina Meyer.  As far side effects from the Dacogen, I think fatigue might be the biggest one. Kristina Meyer may have some nausea. Kristina Meyer may have some diarrhea. Kristina Meyer may have weakness. The risk of infection is certainly a consideration.  Kristina Meyer understands all of this and agrees to have this done.  I will try to  get a Port-A-Cath placed on the 16th. I will then start Kristina Meyer on the 17th.  Kristina Meyer will come in tomorrow for Kristina Meyer blood transfusion.   Volanda Napoleon, MD 8/10/20174:33 PM

## 2016-01-06 ENCOUNTER — Ambulatory Visit (HOSPITAL_BASED_OUTPATIENT_CLINIC_OR_DEPARTMENT_OTHER): Payer: Medicare Other

## 2016-01-06 ENCOUNTER — Other Ambulatory Visit: Payer: Self-pay | Admitting: Family

## 2016-01-06 DIAGNOSIS — C92Z0 Other myeloid leukemia not having achieved remission: Secondary | ICD-10-CM | POA: Diagnosis not present

## 2016-01-06 DIAGNOSIS — C9300 Acute monoblastic/monocytic leukemia, not having achieved remission: Secondary | ICD-10-CM

## 2016-01-06 LAB — LACTATE DEHYDROGENASE: LDH: 291 U/L — ABNORMAL HIGH (ref 125–245)

## 2016-01-06 LAB — PREPARE RBC (CROSSMATCH)

## 2016-01-06 MED ORDER — SODIUM CHLORIDE 0.9 % IV SOLN
250.0000 mL | Freq: Once | INTRAVENOUS | Status: AC
  Administered 2016-01-06: 250 mL via INTRAVENOUS

## 2016-01-06 MED ORDER — DIPHENHYDRAMINE HCL 25 MG PO CAPS
25.0000 mg | ORAL_CAPSULE | Freq: Once | ORAL | Status: AC
  Administered 2016-01-06: 25 mg via ORAL

## 2016-01-06 MED ORDER — FUROSEMIDE 10 MG/ML IJ SOLN
INTRAMUSCULAR | Status: AC
  Filled 2016-01-06: qty 4

## 2016-01-06 MED ORDER — FUROSEMIDE 10 MG/ML IJ SOLN
20.0000 mg | Freq: Once | INTRAMUSCULAR | Status: AC
  Administered 2016-01-06: 10 mg via INTRAVENOUS

## 2016-01-06 MED ORDER — DIPHENHYDRAMINE HCL 25 MG PO CAPS
ORAL_CAPSULE | ORAL | Status: AC
  Filled 2016-01-06: qty 1

## 2016-01-06 MED ORDER — ACETAMINOPHEN 325 MG PO TABS
ORAL_TABLET | ORAL | Status: AC
  Filled 2016-01-06: qty 2

## 2016-01-06 MED ORDER — ACETAMINOPHEN 325 MG PO TABS
650.0000 mg | ORAL_TABLET | Freq: Once | ORAL | Status: AC
  Administered 2016-01-06: 650 mg via ORAL

## 2016-01-06 NOTE — Patient Instructions (Signed)

## 2016-01-09 ENCOUNTER — Other Ambulatory Visit: Payer: Medicare Other

## 2016-01-09 ENCOUNTER — Encounter: Payer: Self-pay | Admitting: Hematology & Oncology

## 2016-01-09 LAB — TYPE AND SCREEN
ABO/RH(D): A POS
Antibody Screen: NEGATIVE
UNIT DIVISION: 0
Unit division: 0

## 2016-01-10 ENCOUNTER — Other Ambulatory Visit: Payer: Self-pay | Admitting: Radiology

## 2016-01-11 ENCOUNTER — Telehealth: Payer: Self-pay | Admitting: *Deleted

## 2016-01-11 ENCOUNTER — Ambulatory Visit: Payer: Medicare Other | Admitting: Hematology & Oncology

## 2016-01-11 ENCOUNTER — Ambulatory Visit (HOSPITAL_COMMUNITY)
Admission: RE | Admit: 2016-01-11 | Discharge: 2016-01-11 | Disposition: A | Discharge status: Home / Self Care | Payer: Medicare Other | Source: Ambulatory Visit | Attending: Hematology & Oncology | Admitting: Hematology & Oncology

## 2016-01-11 ENCOUNTER — Encounter (HOSPITAL_COMMUNITY): Payer: Self-pay

## 2016-01-11 ENCOUNTER — Other Ambulatory Visit: Payer: Self-pay | Admitting: *Deleted

## 2016-01-11 ENCOUNTER — Other Ambulatory Visit: Payer: Medicare Other

## 2016-01-11 ENCOUNTER — Other Ambulatory Visit: Payer: Self-pay | Admitting: Hematology & Oncology

## 2016-01-11 DIAGNOSIS — D72829 Elevated white blood cell count, unspecified: Secondary | ICD-10-CM

## 2016-01-11 DIAGNOSIS — Z87891 Personal history of nicotine dependence: Secondary | ICD-10-CM | POA: Insufficient documentation

## 2016-01-11 DIAGNOSIS — Z7982 Long term (current) use of aspirin: Secondary | ICD-10-CM | POA: Diagnosis not present

## 2016-01-11 DIAGNOSIS — F419 Anxiety disorder, unspecified: Secondary | ICD-10-CM | POA: Diagnosis not present

## 2016-01-11 DIAGNOSIS — R011 Cardiac murmur, unspecified: Secondary | ICD-10-CM | POA: Insufficient documentation

## 2016-01-11 DIAGNOSIS — C9300 Acute monoblastic/monocytic leukemia, not having achieved remission: Secondary | ICD-10-CM

## 2016-01-11 DIAGNOSIS — C9200 Acute myeloblastic leukemia, not having achieved remission: Secondary | ICD-10-CM | POA: Diagnosis not present

## 2016-01-11 DIAGNOSIS — I10 Essential (primary) hypertension: Secondary | ICD-10-CM | POA: Insufficient documentation

## 2016-01-11 DIAGNOSIS — C9310 Chronic myelomonocytic leukemia not having achieved remission: Secondary | ICD-10-CM

## 2016-01-11 HISTORY — PX: IR GENERIC HISTORICAL: IMG1180011

## 2016-01-11 LAB — CBC WITH DIFFERENTIAL/PLATELET
Basophils Absolute: 0 10*3/uL (ref 0.0–0.1)
Basophils Relative: 1 %
EOS PCT: 1 %
Eosinophils Absolute: 0 10*3/uL (ref 0.0–0.7)
HCT: 33.4 % — ABNORMAL LOW (ref 36.0–46.0)
HEMOGLOBIN: 11.1 g/dL — AB (ref 12.0–15.0)
LYMPHS PCT: 73 %
Lymphs Abs: 1.3 10*3/uL (ref 0.7–4.0)
MCH: 32.2 pg (ref 26.0–34.0)
MCHC: 33.2 g/dL (ref 30.0–36.0)
MCV: 96.8 fL (ref 78.0–100.0)
MONO ABS: 0.2 10*3/uL (ref 0.1–1.0)
MONOS PCT: 13 %
NEUTROS PCT: 12 %
Neutro Abs: 0.2 10*3/uL — ABNORMAL LOW (ref 1.7–7.7)
PLATELETS: 104 10*3/uL — AB (ref 150–400)
RBC: 3.45 MIL/uL — AB (ref 3.87–5.11)
RDW: 16.3 % — ABNORMAL HIGH (ref 11.5–15.5)
WBC: 1.7 10*3/uL — AB (ref 4.0–10.5)

## 2016-01-11 LAB — BASIC METABOLIC PANEL
ANION GAP: 9 (ref 5–15)
BUN: 17 mg/dL (ref 6–20)
CALCIUM: 9.1 mg/dL (ref 8.9–10.3)
CO2: 26 mmol/L (ref 22–32)
Chloride: 103 mmol/L (ref 101–111)
Creatinine, Ser: 1.05 mg/dL — ABNORMAL HIGH (ref 0.44–1.00)
GFR calc Af Amer: 58 mL/min — ABNORMAL LOW (ref >60)
GFR calc non Af Amer: 50 mL/min — ABNORMAL LOW (ref >60)
GLUCOSE: 104 mg/dL — AB (ref 65–99)
Potassium: 3.1 mmol/L — ABNORMAL LOW (ref 3.5–5.1)
Sodium: 138 mmol/L (ref 135–145)

## 2016-01-11 LAB — PROTIME-INR
INR: 1.2
Prothrombin Time: 15.3 seconds — ABNORMAL HIGH (ref 11.4–15.2)

## 2016-01-11 MED ORDER — HEPARIN SOD (PORK) LOCK FLUSH 100 UNIT/ML IV SOLN
INTRAVENOUS | Status: AC
  Filled 2016-01-11: qty 5

## 2016-01-11 MED ORDER — SODIUM CHLORIDE 0.9 % IV SOLN
INTRAVENOUS | Status: DC
  Administered 2016-01-11: 12:00:00 via INTRAVENOUS

## 2016-01-11 MED ORDER — LIDOCAINE HCL 1 % IJ SOLN
INTRAMUSCULAR | Status: AC
  Filled 2016-01-11: qty 20

## 2016-01-11 MED ORDER — CEFAZOLIN SODIUM-DEXTROSE 2-4 GM/100ML-% IV SOLN
2.0000 g | INTRAVENOUS | Status: AC
  Administered 2016-01-11: 2 g via INTRAVENOUS
  Filled 2016-01-11: qty 100

## 2016-01-11 MED ORDER — FENTANYL CITRATE (PF) 100 MCG/2ML IJ SOLN
INTRAMUSCULAR | Status: AC | PRN
  Administered 2016-01-11: 50 ug via INTRAVENOUS

## 2016-01-11 MED ORDER — LIDOCAINE-PRILOCAINE 2.5-2.5 % EX CREA: 1.0000 "application " | TOPICAL_CREAM | CUTANEOUS | 3 refills | Status: AC | PRN

## 2016-01-11 MED ORDER — HEPARIN SOD (PORK) LOCK FLUSH 100 UNIT/ML IV SOLN
INTRAVENOUS | Status: AC | PRN
  Administered 2016-01-11: 500 [IU]

## 2016-01-11 MED ORDER — MIDAZOLAM HCL 2 MG/2ML IJ SOLN
INTRAMUSCULAR | Status: AC
  Filled 2016-01-11: qty 6

## 2016-01-11 MED ORDER — FENTANYL CITRATE (PF) 100 MCG/2ML IJ SOLN
INTRAMUSCULAR | Status: AC
  Filled 2016-01-11: qty 4

## 2016-01-11 MED ORDER — MIDAZOLAM HCL 2 MG/2ML IJ SOLN
INTRAMUSCULAR | Status: AC | PRN
  Administered 2016-01-11 (×2): 1 mg via INTRAVENOUS

## 2016-01-11 NOTE — Discharge Instructions (Signed)
Implanted Port Home Guide °An implanted port is a type of central line that is placed under the skin. Central lines are used to provide IV access when treatment or nutrition needs to be given through a person's veins. Implanted ports are used for long-term IV access. An implanted port may be placed because:  °· You need IV medicine that would be irritating to the small veins in your hands or arms.   °· You need long-term IV medicines, such as antibiotics.   °· You need IV nutrition for a long period.   °· You need frequent blood draws for lab tests.   °· You need dialysis.   °Implanted ports are usually placed in the chest area, but they can also be placed in the upper arm, the abdomen, or the leg. An implanted port has two main parts:  °· Reservoir. The reservoir is round and will appear as a small, raised area under your skin. The reservoir is the part where a needle is inserted to give medicines or draw blood.   °· Catheter. The catheter is a thin, flexible tube that extends from the reservoir. The catheter is placed into a large vein. Medicine that is inserted into the reservoir goes into the catheter and then into the vein.   °HOW WILL I CARE FOR MY INCISION SITE? °Do not get the incision site wet. Bathe or shower as directed by your health care provider.  °HOW IS MY PORT ACCESSED? °Special steps must be taken to access the port:  °· Before the port is accessed, a numbing cream can be placed on the skin. This helps numb the skin over the port site.   °· Your health care provider uses a sterile technique to access the port. °· Your health care provider must put on a mask and sterile gloves. °· The skin over your port is cleaned carefully with an antiseptic and allowed to dry. °· The port is gently pinched between sterile gloves, and a needle is inserted into the port. °· Only "non-coring" port needles should be used to access the port. Once the port is accessed, a blood return should be checked. This helps  ensure that the port is in the vein and is not clogged.   °· If your port needs to remain accessed for a constant infusion, a clear (transparent) bandage will be placed over the needle site. The bandage and needle will need to be changed every week, or as directed by your health care provider.   °· Keep the bandage covering the needle clean and dry. Do not get it wet. Follow your health care provider's instructions on how to take a shower or bath while the port is accessed.   °· If your port does not need to stay accessed, no bandage is needed over the port.   °WHAT IS FLUSHING? °Flushing helps keep the port from getting clogged. Follow your health care provider's instructions on how and when to flush the port. Ports are usually flushed with saline solution or a medicine called heparin. The need for flushing will depend on how the port is used.  °· If the port is used for intermittent medicines or blood draws, the port will need to be flushed:   °· After medicines have been given.   °· After blood has been drawn.   °· As part of routine maintenance.   °· If a constant infusion is running, the port may not need to be flushed.   °HOW LONG WILL MY PORT STAY IMPLANTED? °The port can stay in for as long as your health care   provider thinks it is needed. When it is time for the port to come out, surgery will be done to remove it. The procedure is similar to the one performed when the port was put in.  °WHEN SHOULD I SEEK IMMEDIATE MEDICAL CARE? °When you have an implanted port, you should seek immediate medical care if:  °· You notice a bad smell coming from the incision site.   °· You have swelling, redness, or drainage at the incision site.   °· You have more swelling or pain at the port site or the surrounding area.   °· You have a fever that is not controlled with medicine. °  °This information is not intended to replace advice given to you by your health care provider. Make sure you discuss any questions you have with  your health care provider. °  °Document Released: 05/14/2005 Document Revised: 03/04/2013 Document Reviewed: 01/19/2013 °Elsevier Interactive Patient Education ©2016 [employer] °Implanted Port Insertion, Care After °Refer to this sheet in the next few weeks. These instructions provide you with information on caring for yourself after your procedure. Your health care provider may also give you more specific instructions. Your treatment has been planned according to current medical practices, but problems sometimes occur. Call your health care provider if you have any problems or questions after your procedure. °WHAT TO EXPECT AFTER THE PROCEDURE °After your procedure, it is typical to have the following:  °· Discomfort at the port insertion site. Ice packs to the area will help. °· Bruising on the skin over the port. This will subside in 3-4 days. °HOME CARE INSTRUCTIONS °· After your port is placed, you will get a manufacturer's information card. The card has information about your port. Keep this card with you at all times.   °· Know what kind of port you have. There are many types of ports available.   °· Wear a medical alert bracelet in case of an emergency. This can help alert health care workers that you have a port.   °· The port can stay in for as long as your health care provider believes it is necessary.   °· A home health care nurse may give medicines and take care of the port.   °· You or a family member can get special training and directions for giving medicine and taking care of the port at home.   °SEEK MEDICAL CARE IF:  °· Your port does not flush or you are unable to get a blood return.   °· You have a fever or chills. °SEEK IMMEDIATE MEDICAL CARE IF: °· You have new fluid or pus coming from your incision.   °· You notice a bad smell coming from your incision site.   °· You have swelling, pain, or more redness at the incision or port site.   °· You have chest pain or shortness of breath. °  °This  information is not intended to replace advice given to you by your health care provider. Make sure you discuss any questions you have with your health care provider. °  °Document Released: 03/04/2013 Document Revised: 05/19/2013 Document Reviewed: 03/04/2013 °Elsevier Interactive Patient Education ©2016 [employer] °Moderate Conscious Sedation, Adult, Care After °Refer to this sheet in the next few weeks. These instructions provide you with information on caring for yourself after your procedure. Your health care provider may also give you more specific instructions. Your treatment has been planned according to current medical practices, but problems sometimes occur. Call your health care provider if you have any problems or questions after your   procedure. °WHAT TO EXPECT AFTER THE PROCEDURE  °After your procedure: °· You may feel sleepy, clumsy, and have poor balance for several hours. °· Vomiting may occur if you eat too soon after the procedure. °HOME CARE INSTRUCTIONS °· Do not participate in any activities where you could become injured for at least 24 hours. Do not: °¨ Drive. °¨ Swim. °¨ Ride a bicycle. °¨ Operate heavy machinery. °¨ Cook. °¨ Use power tools. °¨ Climb ladders. °¨ Work from a high place. °· Do not make important decisions or sign legal documents until you are improved. °· If you vomit, drink water, juice, or soup when you can drink without vomiting. Make sure you have little or no nausea before eating solid foods. °· Only take over-the-counter or prescription medicines for pain, discomfort, or fever as directed by your health care provider. °· Make sure you and your family fully understand everything about the medicines given to you, including what side effects may occur. °· You should not drink alcohol, take sleeping pills, or take medicines that cause drowsiness for at least 24 hours. °· If you smoke, do not smoke without supervision. °· If you are feeling better, you may resume normal  activities 24 hours after you were sedated. °· Keep all appointments with your health care provider. °SEEK MEDICAL CARE IF: °· Your skin is pale or bluish in color. °· You continue to feel nauseous or vomit. °· Your pain is getting worse and is not helped by medicine. °· You have bleeding or swelling. °· You are still sleepy or feeling clumsy after 24 hours. °SEEK IMMEDIATE MEDICAL CARE IF: °· You develop a rash. °· You have difficulty breathing. °· You develop any type of allergic problem. °· You have a fever. °MAKE SURE YOU: °· Understand these instructions. °· Will watch your condition. °· Will get help right away if you are not doing well or get worse. °  °This information is not intended to replace advice given to you by your health care provider. Make sure you discuss any questions you have with your health care provider. °  °Document Released: 03/04/2013 Document Revised: 06/04/2014 Document Reviewed: 03/04/2013 °Elsevier Interactive Patient Education ©2016 Elsevier Inc. ° °

## 2016-01-11 NOTE — Procedures (Signed)
Interventional Radiology Procedure Note  Procedure: Placement of a right IJ approach single lumen PowerPort.  Tip is positioned at the superior cavoatrial junction and catheter is ready for immediate use.  Complications: No immediate Recommendations:  - Ok to shower tomorrow - Do not submerge for 7 days - Routine line care   Signed,  Heath K. McCullough, MD   

## 2016-01-11 NOTE — Consult Note (Signed)
Chief Complaint: Patient was seen in consultation today for port a cath placement  Referring Physician(s): Ennever,Peter R  Supervising Physician: Jacqulynn Cadet  Patient Status: Outpatient  History of Present Illness: Kristina Meyer is a 75 y.o. female with history of recently diagnosed AML who presents today for port a cath placement for chemotherapy.   Past Medical History:  Diagnosis Date  . 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 Anxiety   . Heart murmur    echo done 20 yrs ago  normal  . Hypertension   . PONV (postoperative nausea and vomiting)    req patch    Past Surgical History:  Procedure Laterality Date  . BREAST CYST EXCISION Left 1970  . BREAST SURGERY Left 76   lump   . EYE SURGERY Bilateral 09   cataracts  . HEMORRHOID SURGERY  85  . LUMBAR LAMINECTOMY/DECOMPRESSION MICRODISCECTOMY Right 12/02/2013   Procedure: L5-S1 RIGHT DISCECTOMY;  Surgeon: Melina Schools, MD;  Location: [facility];  Service: Orthopedics;  Laterality: Right;    Allergies: Other  Medications: Prior to Admission medications   Medication Sig Start Date End Date Taking? Authorizing Provider  allopurinol (ZYLOPRIM) 100 MG tablet Take 1 tablet (100 mg total) by mouth daily. 01/05/16  Yes Volanda Napoleon, MD  ALPRAZolam Duanne Moron) 0.5 MG tablet Take 1 tablet (0.5 mg total) by mouth 3 (three) times daily as needed for anxiety. 12/26/15  Yes Volanda Napoleon, MD  atorvastatin (LIPITOR) 20 MG tablet Take 10 mg by mouth at bedtime.   Yes Historical Provider, MD  Calcium-Magnesium-Vitamin D (CALCIUM 1200+D3 PO) Take 1 tablet by mouth daily.   Yes Historical Provider, MD  cholecalciferol (VITAMIN D) 1000 UNITS tablet Take 1,000 Units by mouth daily.   Yes Historical Provider, MD  hydrochlorothiazide (HYDRODIURIL) 25 MG tablet Take by mouth. 12/08/16  Yes Historical Provider, MD  Multiple Vitamins-Minerals (MULTIVITAMIN ADULT) TABS Take 1 tablet by mouth daily.   Yes Historical Provider, MD    orphenadrine (NORFLEX) 100 MG tablet Take 1 tablet (100 mg total) by mouth at bedtime as needed for muscle spasms. 12/29/15  Yes Volanda Napoleon, MD  PARoxetine (PAXIL) 20 MG tablet Take 20 mg by mouth daily.   Yes Historical Provider, MD  prochlorperazine (COMPAZINE) 5 MG tablet Take 1 tablet (5 mg total) by mouth every 6 (six) hours as needed for nausea or vomiting. 01/05/16  Yes Volanda Napoleon, MD  sulfamethoxazole-trimethoprim (BACTRIM DS) 800-160 MG per tablet Take 1 tablet by mouth daily.   Yes Historical Provider, MD  aspirin 81 MG tablet Take by mouth.    Historical Provider, MD  Multiple Vitamin (MULTIVITAMIN) LIQD Take 5 mLs by mouth daily.    Historical Provider, MD     History reviewed. No pertinent family history.  Social History   Social History  . Marital status: Married    Spouse name: N/A  . Number of children: N/A  . Years of education: N/A   Social History Main Topics  . Smoking status: Former Smoker    Packs/day: 0.50    Years: 5.00    Types: Cigarettes    Quit date: 12/02/1966  . Smokeless tobacco: Never Used  . Alcohol use No  . Drug use: No  . Sexual activity: Not Asked   Other Topics Concern  . None   Social History Narrative  . None      Review of Systems currently denies fever, headache, chest pain, dyspnea, cough, abdominal/back pain,  nausea, vomiting or abnormal bleeding. She has had weight loss, occ night sweats and fatigue.  Vital Signs: BP (!) 154/59   Pulse 83   Temp 98.1 F (36.7 C) (Oral)   Resp 16   Ht 5' 2.5" (1.588 m)   Wt 137 lb (62.1 kg)   SpO2 100%   BMI 24.66 kg/m   Physical Exam awake/alert; chest- CTA bilat; heart- RRR; abd- soft,+BS,NT; LE- no edema  Mallampati Score:     Imaging: No results found.  Labs:  CBC:  Recent Labs  12/29/15 1448 01/03/16 0645 01/05/16 1448 01/11/16 1225  WBC 47.4* 14.9* 4.0 1.7*  HGB 10.2* 9.5* 8.7* 11.1*  HCT 32.3* 29.9* 26.4* 33.4*  PLT 87* 88* 91* 104*     COAGS:  Recent Labs  01/11/16 1225  INR 1.20    BMP:  Recent Labs  12/21/15 1401 01/05/16 1449 01/11/16 1225  NA 140 136 138  K 3.2* 3.3* 3.1*  CL  --  100 103  CO2 29 27 26   GLUCOSE 116 127* 104*  BUN 21.5 34* 17  CALCIUM 9.6 9.5 9.1  CREATININE 1.4* 1.33* 1.05*  GFRNONAA  --  39* 50*  GFRAA  --  44* 58*    LIVER FUNCTION TESTS:  Recent Labs  12/21/15 1401 01/05/16 1449  BILITOT <0.30 0.4  AST 36* 31  ALT 26 32  ALKPHOS 68 60  PROT 7.0 6.6  ALBUMIN 3.9 4.1    TUMOR MARKERS: No results for input(s): AFPTM, CEA, CA199, CHROMGRNA in the last 8760 hours.  Assessment and Plan: 75 y.o. female with history of recently diagnosed AML who presents today for port a cath placement for chemotherapy. Risks and benefits discussed with the patient/husband including, but not limited to bleeding, infection, pneumothorax, or fibrin sheath development and need for additional procedures. All of the patient's questions were answered, patient is agreeable to proceed.Consent signed and in chart. Results of pt's latest CBC reviewed with Dr. Marin Olp and he is ok with proceeding with port placement today.       Thank you for this interesting consult.  I greatly enjoyed meeting Kristina Meyer and look forward to participating in their care.  A copy of this report was sent to the requesting provider on this date.  Electronically Signed: D. Rowe Robert 01/11/2016, 1:43 PM   I spent a total of  20 minutes   in face to face in clinical consultation, greater than 50% of which was counseling/coordinating care for port a cath placement

## 2016-01-11 NOTE — Telephone Encounter (Signed)
Received call from [facility] in Radiology putting in port in patient.  PAtient WBC 1.7 ANC .2.  Ok to proceed per dr. Marin Olp

## 2016-01-12 ENCOUNTER — Other Ambulatory Visit: Payer: Self-pay | Admitting: *Deleted

## 2016-01-12 ENCOUNTER — Ambulatory Visit (HOSPITAL_BASED_OUTPATIENT_CLINIC_OR_DEPARTMENT_OTHER): Payer: Medicare Other

## 2016-01-12 ENCOUNTER — Other Ambulatory Visit (HOSPITAL_BASED_OUTPATIENT_CLINIC_OR_DEPARTMENT_OTHER): Payer: Medicare Other

## 2016-01-12 ENCOUNTER — Telehealth: Payer: Self-pay | Admitting: *Deleted

## 2016-01-12 VITALS — BP 124/57 | HR 70 | Temp 98.2°F | Resp 18

## 2016-01-12 DIAGNOSIS — C92Z0 Other myeloid leukemia not having achieved remission: Secondary | ICD-10-CM

## 2016-01-12 DIAGNOSIS — C9300 Acute monoblastic/monocytic leukemia, not having achieved remission: Secondary | ICD-10-CM

## 2016-01-12 DIAGNOSIS — Z5111 Encounter for antineoplastic chemotherapy: Secondary | ICD-10-CM | POA: Diagnosis not present

## 2016-01-12 LAB — CBC WITH DIFFERENTIAL (CANCER CENTER ONLY)
BASO#: 0 10*3/uL (ref 0.0–0.2)
BASO%: 2.1 % — AB (ref 0.0–2.0)
EOS ABS: 0 10*3/uL (ref 0.0–0.5)
EOS%: 0.5 % (ref 0.0–7.0)
HCT: 30.5 % — ABNORMAL LOW (ref 34.8–46.6)
HEMOGLOBIN: 10 g/dL — AB (ref 11.6–15.9)
LYMPH#: 1.4 10*3/uL (ref 0.9–3.3)
LYMPH%: 74.9 % — ABNORMAL HIGH (ref 14.0–48.0)
MCH: 32.7 pg (ref 26.0–34.0)
MCHC: 32.8 g/dL (ref 32.0–36.0)
MCV: 100 fL (ref 81–101)
MONO#: 0.2 10*3/uL (ref 0.1–0.9)
MONO%: 7.9 % (ref 0.0–13.0)
NEUT%: 14.6 % — ABNORMAL LOW (ref 39.6–80.0)
NEUTROS ABS: 0.3 10*3/uL — AB (ref 1.5–6.5)
PLATELETS: 94 10*3/uL — AB (ref 145–400)
RBC: 3.06 10*6/uL — AB (ref 3.70–5.32)
RDW: 16 % — ABNORMAL HIGH (ref 11.1–15.7)
WBC: 1.9 10*3/uL — AB (ref 3.9–10.0)

## 2016-01-12 LAB — CHROMOSOME ANALYSIS, BONE MARROW

## 2016-01-12 MED ORDER — SODIUM CHLORIDE 0.9% FLUSH
10.0000 mL | INTRAVENOUS | Status: DC | PRN
  Administered 2016-01-12: 10 mL
  Filled 2016-01-12: qty 10

## 2016-01-12 MED ORDER — PROCHLORPERAZINE MALEATE 10 MG PO TABS: 10.0000 mg | ORAL_TABLET | Freq: Once | ORAL | Status: DC

## 2016-01-12 MED ORDER — HEPARIN SOD (PORK) LOCK FLUSH 100 UNIT/ML IV SOLN
500.0000 [IU] | Freq: Once | INTRAVENOUS | Status: AC | PRN
  Administered 2016-01-12: 500 [IU]
  Filled 2016-01-12: qty 5

## 2016-01-12 MED ORDER — SODIUM CHLORIDE 0.9 % IV SOLN
Freq: Once | INTRAVENOUS | Status: AC
  Administered 2016-01-12: 14:00:00 via INTRAVENOUS

## 2016-01-12 MED ORDER — SODIUM CHLORIDE 0.9 % IV SOLN
16.0000 mg/m2 | Freq: Once | INTRAVENOUS | Status: AC
  Administered 2016-01-12: 25 mg via INTRAVENOUS
  Filled 2016-01-12: qty 5

## 2016-01-12 NOTE — Patient Instructions (Signed)
[facility] Discharge Instructions for Patients Receiving Chemotherapy  Today you received the following chemotherapy agents  Decitabine   To help prevent nausea and vomiting after your treatment, we encourage you to take your nausea medication as prescribed  1. Prochlorperazine (Compazine) 5 MG - Take 1 tablet (5 mg total) by mouth every 6 hours as needed for nausea or vomiting.   If you develop nausea and vomiting that is not controlled by your nausea medication, call the clinic. If it is after clinic hours your family physician or the after hours number for the clinic or go to the Emergency Department.   BELOW ARE SYMPTOMS THAT SHOULD BE REPORTED IMMEDIATELY:  *FEVER GREATER THAN 100.5 F  *CHILLS WITH OR WITHOUT FEVER  NAUSEA AND VOMITING THAT IS NOT CONTROLLED WITH YOUR NAUSEA MEDICATION  *UNUSUAL SHORTNESS OF BREATH  *UNUSUAL BRUISING OR BLEEDING  TENDERNESS IN MOUTH AND THROAT WITH OR WITHOUT PRESENCE OF ULCERS  *URINARY PROBLEMS  *BOWEL PROBLEMS  UNUSUAL RASH Items with * indicate a potential emergency and should be followed up as soon as possible.  One of the nurses will contact you 24 hours after your treatment. Please let the nurse know about any problems that you may have experienced. Feel free to call the clinic you have any questions or concerns. The clinic phone number is [phone].   I have been informed and understand all the instructions given to me. I know to contact the clinic, my physician, or go to the Emergency Department if any problems should occur. I do not have any questions at this time, but understand that I may call the clinic during office hours   should I have any questions or need assistance in obtaining follow up care.    __________________________________________  _____________  __________ Signature of Patient or Authorized Representative            Date                    Time    __________________________________________ Nurse's Signature  Decitabine injection for infusion What is this medicine? DECITABINE (dee SYE ta been) is a chemotherapy drug. This medicine reduces the growth of cancer cells. It is used to treat adults with myelodysplastic syndromes. This medicine may be used for other purposes; ask your health care provider or pharmacist if you have questions. What should I tell my health care provider before I take this medicine? They need to know if you have any of these conditions: -infection (especially a virus infection such as chickenpox, cold sores, or herpes) -kidney disease -liver disease -an unusual or allergic reaction to decitabine, other medicines, foods, dyes, or preservatives -pregnant or trying to get pregnant -breast-feeding How should I use this medicine? This medicine is for infusion into a vein. It is administered in a hospital or clinic by a doctor or health care professional. Talk to your pediatrician regarding the use of this medicine in children. Special care may be needed. Overdosage: If you think you have taken too much of this medicine contact a poison control center or emergency room at once. NOTE: This medicine is only for you. Do not share this medicine with others. What if I miss a dose? It is important not to miss your dose. Call your doctor or health care professional if you are unable to keep an appointment. What may interact with this medicine? -vaccines Talk to your doctor or health care professional before taking any of these medicines: -aspirin -  acetaminophen -ibuprofen -ketoprofen -naproxen This list may not describe all possible interactions. Give your health care provider a list of all the medicines, herbs, non-prescription drugs, or dietary supplements you use. Also tell them if you smoke, drink alcohol, or use illegal drugs. Some items may interact with your medicine. What should I watch for while  using this medicine? Visit your doctor for checks on your progress. This drug may make you feel generally unwell. This is not uncommon, as chemotherapy can affect healthy cells as well as cancer cells. Report any side effects. Continue your course of treatment even though you feel ill unless your doctor tells you to stop. In some cases, you may be given additional medicines to help with side effects. Follow all directions for their use. Call your doctor or health care professional for advice if you get a fever, chills or sore throat, or other symptoms of a cold or flu. Do not treat yourself. This drug decreases your body's ability to fight infections. Try to avoid being around people who are sick. This medicine may increase your risk to bruise or bleed. Call your doctor or health care professional if you notice any unusual bleeding. Do not become pregnant while taking this medicine or for at least 1 month after stopping it. Women should inform their doctor if they wish to become pregnant or think they might be pregnant. Men should not father a child while taking this medicine and for at least 2 months after stopping it. There is a potential for serious side effects to an unborn child. Talk to your health care professional or pharmacist for more information. Do not breast-feed an infant while taking this medicine. What side effects may I notice from receiving this medicine? Side effects that you should report to your doctor or health care professional as soon as possible: -low blood counts - this medicine may decrease the number of white blood cells, red blood cells and platelets. You may be at increased risk for infections and bleeding. -signs of infection - fever or chills, cough, sore throat, pain or difficulty passing urine -signs of decreased platelets or bleeding - bruising, pinpoint red spots on the skin, black, tarry stools, blood in the urine -signs of decreased red blood cells - unusual weakness  or tiredness, fainting spells, lightheadedness -increased blood sugar Side effects that usually do not require medical attention (report to your prescriber or health care professional if they continue or are bothersome): -constipation -diarrhea -headache -loss of appetite -nausea, vomiting -skin rash, itching -stomach pain -water retention -weak or tired This list may not describe all possible side effects. Call your doctor for medical advice about side effects. You may report side effects to FDA at 1-800-FDA-1088. Where should I keep my medicine? This drug is given in a hospital or clinic and will not be stored at home. NOTE: This sheet is a summary. It may not cover all possible information. If you have questions about this medicine, talk to your doctor, pharmacist, or health care provider.    2016, [employer]. (2014-12-14 12:56:02)

## 2016-01-12 NOTE — Telephone Encounter (Signed)
Notified of a critical neutrolphil count of .3.  Dr Marin Olp made aware

## 2016-01-13 ENCOUNTER — Encounter: Payer: Self-pay | Admitting: Hematology & Oncology

## 2016-01-13 ENCOUNTER — Ambulatory Visit (HOSPITAL_BASED_OUTPATIENT_CLINIC_OR_DEPARTMENT_OTHER): Payer: Medicare Other

## 2016-01-13 VITALS — BP 125/57 | HR 73 | Temp 97.6°F | Resp 18

## 2016-01-13 DIAGNOSIS — Z5111 Encounter for antineoplastic chemotherapy: Secondary | ICD-10-CM

## 2016-01-13 DIAGNOSIS — C92Z0 Other myeloid leukemia not having achieved remission: Secondary | ICD-10-CM | POA: Diagnosis not present

## 2016-01-13 DIAGNOSIS — C9300 Acute monoblastic/monocytic leukemia, not having achieved remission: Secondary | ICD-10-CM

## 2016-01-13 MED ORDER — SODIUM CHLORIDE 0.9 % IV SOLN
Freq: Once | INTRAVENOUS | Status: AC
  Administered 2016-01-13: 13:00:00 via INTRAVENOUS

## 2016-01-13 MED ORDER — HEPARIN SOD (PORK) LOCK FLUSH 100 UNIT/ML IV SOLN
500.0000 [IU] | Freq: Once | INTRAVENOUS | Status: AC | PRN
  Administered 2016-01-13: 500 [IU]
  Filled 2016-01-13: qty 5

## 2016-01-13 MED ORDER — SODIUM CHLORIDE 0.9 % IV SOLN
16.0000 mg/m2 | Freq: Once | INTRAVENOUS | Status: AC
  Administered 2016-01-13: 25 mg via INTRAVENOUS
  Filled 2016-01-13: qty 5

## 2016-01-13 MED ORDER — PROCHLORPERAZINE MALEATE 10 MG PO TABS
10.0000 mg | ORAL_TABLET | Freq: Once | ORAL | Status: AC
  Administered 2016-01-13: 10 mg via ORAL

## 2016-01-13 MED ORDER — SODIUM CHLORIDE 0.9% FLUSH
10.0000 mL | INTRAVENOUS | Status: DC | PRN
  Administered 2016-01-13: 10 mL
  Filled 2016-01-13: qty 10

## 2016-01-13 MED ORDER — PROCHLORPERAZINE MALEATE 10 MG PO TABS
ORAL_TABLET | ORAL | Status: AC
  Filled 2016-01-13: qty 1

## 2016-01-13 NOTE — Patient Instructions (Signed)
[facility] Discharge Instructions for Patients Receiving Chemotherapy  Today you received the following chemotherapy agents Decitabine  To help prevent nausea and vomiting after your treatment, we encourage you to take your nausea medication    If you develop nausea and vomiting that is not controlled by your nausea medication, call the clinic.   BELOW ARE SYMPTOMS THAT SHOULD BE REPORTED IMMEDIATELY:  *FEVER GREATER THAN 100.5 F  *CHILLS WITH OR WITHOUT FEVER  NAUSEA AND VOMITING THAT IS NOT CONTROLLED WITH YOUR NAUSEA MEDICATION  *UNUSUAL SHORTNESS OF BREATH  *UNUSUAL BRUISING OR BLEEDING  TENDERNESS IN MOUTH AND THROAT WITH OR WITHOUT PRESENCE OF ULCERS  *URINARY PROBLEMS  *BOWEL PROBLEMS  UNUSUAL RASH Items with * indicate a potential emergency and should be followed up as soon as possible.  Feel free to call the clinic you have any questions or concerns. The clinic phone number is [phone].  Please show the CHEMO ALERT CARD at check-in to the Emergency Department and triage nurse.   

## 2016-01-16 ENCOUNTER — Other Ambulatory Visit (HOSPITAL_BASED_OUTPATIENT_CLINIC_OR_DEPARTMENT_OTHER): Payer: Medicare Other

## 2016-01-16 ENCOUNTER — Ambulatory Visit (HOSPITAL_BASED_OUTPATIENT_CLINIC_OR_DEPARTMENT_OTHER): Payer: Medicare Other

## 2016-01-16 VITALS — BP 137/60 | HR 70 | Temp 98.2°F

## 2016-01-16 DIAGNOSIS — C92Z0 Other myeloid leukemia not having achieved remission: Secondary | ICD-10-CM | POA: Diagnosis not present

## 2016-01-16 DIAGNOSIS — C9300 Acute monoblastic/monocytic leukemia, not having achieved remission: Secondary | ICD-10-CM

## 2016-01-16 DIAGNOSIS — Z5111 Encounter for antineoplastic chemotherapy: Secondary | ICD-10-CM | POA: Diagnosis not present

## 2016-01-16 LAB — CMP (CANCER CENTER ONLY)
ALT(SGPT): 23 U/L (ref 10–47)
AST: 29 U/L (ref 11–38)
Albumin: 3.5 g/dL (ref 3.3–5.5)
Alkaline Phosphatase: 53 U/L (ref 26–84)
BUN: 16 mg/dL (ref 7–22)
CHLORIDE: 103 meq/L (ref 98–108)
CO2: 29 meq/L (ref 18–33)
CREATININE: 0.9 mg/dL (ref 0.6–1.2)
Calcium: 8.6 mg/dL (ref 8.0–10.3)
Glucose, Bld: 125 mg/dL — ABNORMAL HIGH (ref 73–118)
Potassium: 3.7 mEq/L (ref 3.3–4.7)
SODIUM: 134 meq/L (ref 128–145)
TOTAL PROTEIN: 6.1 g/dL — AB (ref 6.4–8.1)
Total Bilirubin: 0.8 mg/dl (ref 0.20–1.60)

## 2016-01-16 LAB — CBC WITH DIFFERENTIAL (CANCER CENTER ONLY)
BASO#: 0.1 10*3/uL (ref 0.0–0.2)
BASO%: 3.2 % — ABNORMAL HIGH (ref 0.0–2.0)
EOS ABS: 0 10*3/uL (ref 0.0–0.5)
EOS%: 0.5 % (ref 0.0–7.0)
HCT: 29.6 % — ABNORMAL LOW (ref 34.8–46.6)
HGB: 9.9 g/dL — ABNORMAL LOW (ref 11.6–15.9)
LYMPH#: 1.4 10*3/uL (ref 0.9–3.3)
LYMPH%: 75.3 % — AB (ref 14.0–48.0)
MCH: 33.6 pg (ref 26.0–34.0)
MCHC: 33.4 g/dL (ref 32.0–36.0)
MCV: 100 fL (ref 81–101)
MONO#: 0.1 10*3/uL (ref 0.1–0.9)
MONO%: 7 % (ref 0.0–13.0)
NEUT#: 0.3 10*3/uL — CL (ref 1.5–6.5)
NEUT%: 14 % — ABNORMAL LOW (ref 39.6–80.0)
PLATELETS: 113 10*3/uL — AB (ref 145–400)
RBC: 2.95 10*6/uL — AB (ref 3.70–5.32)
RDW: 15.8 % — ABNORMAL HIGH (ref 11.1–15.7)
WBC: 1.9 10*3/uL — AB (ref 3.9–10.0)

## 2016-01-16 MED ORDER — SODIUM CHLORIDE 0.9 % IV SOLN
Freq: Once | INTRAVENOUS | Status: AC
  Administered 2016-01-16: 14:00:00 via INTRAVENOUS

## 2016-01-16 MED ORDER — SODIUM CHLORIDE 0.9% FLUSH
10.0000 mL | INTRAVENOUS | Status: DC | PRN
  Administered 2016-01-16: 10 mL
  Filled 2016-01-16: qty 10

## 2016-01-16 MED ORDER — PROCHLORPERAZINE MALEATE 10 MG PO TABS
10.0000 mg | ORAL_TABLET | Freq: Once | ORAL | Status: AC
  Administered 2016-01-16: 10 mg via ORAL

## 2016-01-16 MED ORDER — SODIUM CHLORIDE 0.9 % IV SOLN
16.0000 mg/m2 | Freq: Once | INTRAVENOUS | Status: AC
  Administered 2016-01-16: 25 mg via INTRAVENOUS
  Filled 2016-01-16: qty 5

## 2016-01-16 MED ORDER — HEPARIN SOD (PORK) LOCK FLUSH 100 UNIT/ML IV SOLN
500.0000 [IU] | Freq: Once | INTRAVENOUS | Status: AC | PRN
  Administered 2016-01-16: 500 [IU]
  Filled 2016-01-16: qty 5

## 2016-01-16 MED ORDER — PROCHLORPERAZINE MALEATE 10 MG PO TABS
ORAL_TABLET | ORAL | Status: AC
  Filled 2016-01-16: qty 1

## 2016-01-16 NOTE — Progress Notes (Signed)
OK to treat with WBC 1.9, Neutrophils .3 per Dr. Marin Olp

## 2016-01-16 NOTE — Patient Instructions (Signed)
[facility] Discharge Instructions for Patients Receiving Chemotherapy  Today you received the following chemotherapy agents Decitabine  To help prevent nausea and vomiting after your treatment, we encourage you to take your nausea medication    If you develop nausea and vomiting that is not controlled by your nausea medication, call the clinic.   BELOW ARE SYMPTOMS THAT SHOULD BE REPORTED IMMEDIATELY:  *FEVER GREATER THAN 100.5 F  *CHILLS WITH OR WITHOUT FEVER  NAUSEA AND VOMITING THAT IS NOT CONTROLLED WITH YOUR NAUSEA MEDICATION  *UNUSUAL SHORTNESS OF BREATH  *UNUSUAL BRUISING OR BLEEDING  TENDERNESS IN MOUTH AND THROAT WITH OR WITHOUT PRESENCE OF ULCERS  *URINARY PROBLEMS  *BOWEL PROBLEMS  UNUSUAL RASH Items with * indicate a potential emergency and should be followed up as soon as possible.  Feel free to call the clinic you have any questions or concerns. The clinic phone number is [phone].  Please show the CHEMO ALERT CARD at check-in to the Emergency Department and triage nurse.   

## 2016-01-17 ENCOUNTER — Ambulatory Visit (HOSPITAL_BASED_OUTPATIENT_CLINIC_OR_DEPARTMENT_OTHER): Payer: Medicare Other

## 2016-01-17 VITALS — BP 130/55 | HR 75 | Temp 97.7°F

## 2016-01-17 DIAGNOSIS — C92Z0 Other myeloid leukemia not having achieved remission: Secondary | ICD-10-CM

## 2016-01-17 DIAGNOSIS — Z5111 Encounter for antineoplastic chemotherapy: Secondary | ICD-10-CM

## 2016-01-17 DIAGNOSIS — C9300 Acute monoblastic/monocytic leukemia, not having achieved remission: Secondary | ICD-10-CM

## 2016-01-17 MED ORDER — SODIUM CHLORIDE 0.9 % IV SOLN
16.0000 mg/m2 | Freq: Once | INTRAVENOUS | Status: AC
  Administered 2016-01-17: 25 mg via INTRAVENOUS
  Filled 2016-01-17: qty 5

## 2016-01-17 MED ORDER — PROCHLORPERAZINE MALEATE 10 MG PO TABS
ORAL_TABLET | ORAL | Status: AC
  Filled 2016-01-17: qty 1

## 2016-01-17 MED ORDER — HEPARIN SOD (PORK) LOCK FLUSH 100 UNIT/ML IV SOLN
500.0000 [IU] | Freq: Once | INTRAVENOUS | Status: AC | PRN
  Administered 2016-01-17: 500 [IU]
  Filled 2016-01-17: qty 5

## 2016-01-17 MED ORDER — SODIUM CHLORIDE 0.9% FLUSH
10.0000 mL | INTRAVENOUS | Status: DC | PRN
  Administered 2016-01-17: 10 mL
  Filled 2016-01-17: qty 10

## 2016-01-17 MED ORDER — SODIUM CHLORIDE 0.9 % IV SOLN
Freq: Once | INTRAVENOUS | Status: AC
  Administered 2016-01-17: 14:00:00 via INTRAVENOUS

## 2016-01-17 MED ORDER — PROCHLORPERAZINE MALEATE 10 MG PO TABS
10.0000 mg | ORAL_TABLET | Freq: Once | ORAL | Status: AC
  Administered 2016-01-17: 10 mg via ORAL

## 2016-01-17 NOTE — Patient Instructions (Signed)
[facility] Discharge Instructions for Patients Receiving Chemotherapy  Today you received the following chemotherapy agents Decitabine  To help prevent nausea and vomiting after your treatment, we encourage you to take your nausea medication    If you develop nausea and vomiting that is not controlled by your nausea medication, call the clinic.   BELOW ARE SYMPTOMS THAT SHOULD BE REPORTED IMMEDIATELY:  *FEVER GREATER THAN 100.5 F  *CHILLS WITH OR WITHOUT FEVER  NAUSEA AND VOMITING THAT IS NOT CONTROLLED WITH YOUR NAUSEA MEDICATION  *UNUSUAL SHORTNESS OF BREATH  *UNUSUAL BRUISING OR BLEEDING  TENDERNESS IN MOUTH AND THROAT WITH OR WITHOUT PRESENCE OF ULCERS  *URINARY PROBLEMS  *BOWEL PROBLEMS  UNUSUAL RASH Items with * indicate a potential emergency and should be followed up as soon as possible.  Feel free to call the clinic you have any questions or concerns. The clinic phone number is [phone].  Please show the CHEMO ALERT CARD at check-in to the Emergency Department and triage nurse.   

## 2016-01-18 ENCOUNTER — Ambulatory Visit (HOSPITAL_BASED_OUTPATIENT_CLINIC_OR_DEPARTMENT_OTHER): Payer: Medicare Other

## 2016-01-18 VITALS — BP 129/50 | HR 70 | Temp 98.0°F

## 2016-01-18 DIAGNOSIS — C92Z0 Other myeloid leukemia not having achieved remission: Secondary | ICD-10-CM | POA: Diagnosis not present

## 2016-01-18 DIAGNOSIS — Z5111 Encounter for antineoplastic chemotherapy: Secondary | ICD-10-CM

## 2016-01-18 DIAGNOSIS — C9300 Acute monoblastic/monocytic leukemia, not having achieved remission: Secondary | ICD-10-CM

## 2016-01-18 MED ORDER — SODIUM CHLORIDE 0.9 % IV SOLN
Freq: Once | INTRAVENOUS | Status: AC
  Administered 2016-01-18: 14:00:00 via INTRAVENOUS

## 2016-01-18 MED ORDER — SODIUM CHLORIDE 0.9% FLUSH
10.0000 mL | INTRAVENOUS | Status: DC | PRN
  Administered 2016-01-18: 10 mL
  Filled 2016-01-18: qty 10

## 2016-01-18 MED ORDER — PROCHLORPERAZINE MALEATE 10 MG PO TABS
ORAL_TABLET | ORAL | Status: AC
  Filled 2016-01-18: qty 1

## 2016-01-18 MED ORDER — HEPARIN SOD (PORK) LOCK FLUSH 100 UNIT/ML IV SOLN
500.0000 [IU] | Freq: Once | INTRAVENOUS | Status: AC | PRN
  Administered 2016-01-18: 500 [IU]
  Filled 2016-01-18: qty 5

## 2016-01-18 MED ORDER — PROCHLORPERAZINE MALEATE 10 MG PO TABS
10.0000 mg | ORAL_TABLET | Freq: Once | ORAL | Status: AC
  Administered 2016-01-18: 10 mg via ORAL

## 2016-01-18 MED ORDER — SODIUM CHLORIDE 0.9 % IV SOLN
16.0000 mg/m2 | Freq: Once | INTRAVENOUS | Status: AC
  Administered 2016-01-18: 25 mg via INTRAVENOUS
  Filled 2016-01-18: qty 5

## 2016-01-18 NOTE — Patient Instructions (Signed)
[facility] Discharge Instructions for Patients Receiving Chemotherapy  Today you received the following chemotherapy agents Decitabine  To help prevent nausea and vomiting after your treatment, we encourage you to take your nausea medication    If you develop nausea and vomiting that is not controlled by your nausea medication, call the clinic.   BELOW ARE SYMPTOMS THAT SHOULD BE REPORTED IMMEDIATELY:  *FEVER GREATER THAN 100.5 F  *CHILLS WITH OR WITHOUT FEVER  NAUSEA AND VOMITING THAT IS NOT CONTROLLED WITH YOUR NAUSEA MEDICATION  *UNUSUAL SHORTNESS OF BREATH  *UNUSUAL BRUISING OR BLEEDING  TENDERNESS IN MOUTH AND THROAT WITH OR WITHOUT PRESENCE OF ULCERS  *URINARY PROBLEMS  *BOWEL PROBLEMS  UNUSUAL RASH Items with * indicate a potential emergency and should be followed up as soon as possible.  Feel free to call the clinic you have any questions or concerns. The clinic phone number is [phone].  Please show the CHEMO ALERT CARD at check-in to the Emergency Department and triage nurse.   

## 2016-01-19 ENCOUNTER — Ambulatory Visit (HOSPITAL_BASED_OUTPATIENT_CLINIC_OR_DEPARTMENT_OTHER): Payer: Medicare Other

## 2016-01-19 VITALS — BP 148/59 | HR 74 | Temp 97.8°F

## 2016-01-19 DIAGNOSIS — C92Z0 Other myeloid leukemia not having achieved remission: Secondary | ICD-10-CM | POA: Diagnosis not present

## 2016-01-19 DIAGNOSIS — C9300 Acute monoblastic/monocytic leukemia, not having achieved remission: Secondary | ICD-10-CM

## 2016-01-19 DIAGNOSIS — Z5111 Encounter for antineoplastic chemotherapy: Secondary | ICD-10-CM | POA: Diagnosis not present

## 2016-01-19 MED ORDER — PROCHLORPERAZINE MALEATE 10 MG PO TABS
ORAL_TABLET | ORAL | Status: AC
  Filled 2016-01-19: qty 1

## 2016-01-19 MED ORDER — SODIUM CHLORIDE 0.9% FLUSH
10.0000 mL | INTRAVENOUS | Status: DC | PRN
  Administered 2016-01-19: 10 mL
  Filled 2016-01-19: qty 10

## 2016-01-19 MED ORDER — SODIUM CHLORIDE 0.9 % IV SOLN
16.0000 mg/m2 | Freq: Once | INTRAVENOUS | Status: AC
  Administered 2016-01-19: 25 mg via INTRAVENOUS
  Filled 2016-01-19: qty 5

## 2016-01-19 MED ORDER — PROCHLORPERAZINE MALEATE 10 MG PO TABS
10.0000 mg | ORAL_TABLET | Freq: Once | ORAL | Status: AC
  Administered 2016-01-19: 10 mg via ORAL

## 2016-01-19 MED ORDER — HEPARIN SOD (PORK) LOCK FLUSH 100 UNIT/ML IV SOLN
500.0000 [IU] | Freq: Once | INTRAVENOUS | Status: AC | PRN
  Administered 2016-01-19: 500 [IU]
  Filled 2016-01-19: qty 5

## 2016-01-19 MED ORDER — SODIUM CHLORIDE 0.9 % IV SOLN
Freq: Once | INTRAVENOUS | Status: AC
  Administered 2016-01-19: 14:00:00 via INTRAVENOUS

## 2016-01-19 NOTE — Patient Instructions (Signed)
[facility] Discharge Instructions for Patients Receiving Chemotherapy  Today you received the following chemotherapy agents Decitabine  To help prevent nausea and vomiting after your treatment, we encourage you to take your nausea medication    If you develop nausea and vomiting that is not controlled by your nausea medication, call the clinic.   BELOW ARE SYMPTOMS THAT SHOULD BE REPORTED IMMEDIATELY:  *FEVER GREATER THAN 100.5 F  *CHILLS WITH OR WITHOUT FEVER  NAUSEA AND VOMITING THAT IS NOT CONTROLLED WITH YOUR NAUSEA MEDICATION  *UNUSUAL SHORTNESS OF BREATH  *UNUSUAL BRUISING OR BLEEDING  TENDERNESS IN MOUTH AND THROAT WITH OR WITHOUT PRESENCE OF ULCERS  *URINARY PROBLEMS  *BOWEL PROBLEMS  UNUSUAL RASH Items with * indicate a potential emergency and should be followed up as soon as possible.  Feel free to call the clinic you have any questions or concerns. The clinic phone number is [phone].  Please show the CHEMO ALERT CARD at check-in to the Emergency Department and triage nurse.   

## 2016-01-20 ENCOUNTER — Ambulatory Visit (HOSPITAL_BASED_OUTPATIENT_CLINIC_OR_DEPARTMENT_OTHER): Payer: Medicare Other

## 2016-01-20 VITALS — BP 133/51 | HR 70 | Temp 98.1°F

## 2016-01-20 DIAGNOSIS — Z5111 Encounter for antineoplastic chemotherapy: Secondary | ICD-10-CM

## 2016-01-20 DIAGNOSIS — C9300 Acute monoblastic/monocytic leukemia, not having achieved remission: Secondary | ICD-10-CM

## 2016-01-20 DIAGNOSIS — C92Z0 Other myeloid leukemia not having achieved remission: Secondary | ICD-10-CM

## 2016-01-20 MED ORDER — SODIUM CHLORIDE 0.9 % IV SOLN
16.0000 mg/m2 | Freq: Once | INTRAVENOUS | Status: AC
  Administered 2016-01-20: 25 mg via INTRAVENOUS
  Filled 2016-01-20: qty 5

## 2016-01-20 MED ORDER — HEPARIN SOD (PORK) LOCK FLUSH 100 UNIT/ML IV SOLN
500.0000 [IU] | Freq: Once | INTRAVENOUS | Status: AC | PRN
  Administered 2016-01-20: 500 [IU]
  Filled 2016-01-20: qty 5

## 2016-01-20 MED ORDER — SODIUM CHLORIDE 0.9% FLUSH
10.0000 mL | INTRAVENOUS | Status: DC | PRN
  Administered 2016-01-20: 10 mL
  Filled 2016-01-20: qty 10

## 2016-01-20 MED ORDER — SODIUM CHLORIDE 0.9 % IV SOLN
Freq: Once | INTRAVENOUS | Status: AC
  Administered 2016-01-20: 14:00:00 via INTRAVENOUS

## 2016-01-20 MED ORDER — PROCHLORPERAZINE MALEATE 10 MG PO TABS
10.0000 mg | ORAL_TABLET | Freq: Once | ORAL | Status: AC
  Administered 2016-01-20: 10 mg via ORAL

## 2016-01-20 MED ORDER — PROCHLORPERAZINE MALEATE 10 MG PO TABS
ORAL_TABLET | ORAL | Status: AC
  Filled 2016-01-20: qty 1

## 2016-01-20 NOTE — Patient Instructions (Signed)
[facility] Discharge Instructions for Patients Receiving Chemotherapy  Today you received the following chemotherapy agents Decitabine  To help prevent nausea and vomiting after your treatment, we encourage you to take your nausea medication    If you develop nausea and vomiting that is not controlled by your nausea medication, call the clinic.   BELOW ARE SYMPTOMS THAT SHOULD BE REPORTED IMMEDIATELY:  *FEVER GREATER THAN 100.5 F  *CHILLS WITH OR WITHOUT FEVER  NAUSEA AND VOMITING THAT IS NOT CONTROLLED WITH YOUR NAUSEA MEDICATION  *UNUSUAL SHORTNESS OF BREATH  *UNUSUAL BRUISING OR BLEEDING  TENDERNESS IN MOUTH AND THROAT WITH OR WITHOUT PRESENCE OF ULCERS  *URINARY PROBLEMS  *BOWEL PROBLEMS  UNUSUAL RASH Items with * indicate a potential emergency and should be followed up as soon as possible.  Feel free to call the clinic you have any questions or concerns. The clinic phone number is [phone].  Please show the CHEMO ALERT CARD at check-in to the Emergency Department and triage nurse.   

## 2016-01-25 ENCOUNTER — Encounter: Payer: Self-pay | Admitting: Hematology & Oncology

## 2016-01-25 ENCOUNTER — Ambulatory Visit (HOSPITAL_BASED_OUTPATIENT_CLINIC_OR_DEPARTMENT_OTHER): Payer: Medicare Other | Admitting: Hematology & Oncology

## 2016-01-25 ENCOUNTER — Other Ambulatory Visit (HOSPITAL_BASED_OUTPATIENT_CLINIC_OR_DEPARTMENT_OTHER): Payer: Medicare Other

## 2016-01-25 ENCOUNTER — Ambulatory Visit (HOSPITAL_BASED_OUTPATIENT_CLINIC_OR_DEPARTMENT_OTHER): Payer: Medicare Other

## 2016-01-25 VITALS — BP 141/60 | HR 76 | Temp 97.9°F | Resp 18 | Ht 62.5 in | Wt 140.1 lb

## 2016-01-25 DIAGNOSIS — C9310 Chronic myelomonocytic leukemia not having achieved remission: Secondary | ICD-10-CM

## 2016-01-25 DIAGNOSIS — C9300 Acute monoblastic/monocytic leukemia, not having achieved remission: Secondary | ICD-10-CM

## 2016-01-25 DIAGNOSIS — Z95828 Presence of other vascular implants and grafts: Secondary | ICD-10-CM

## 2016-01-25 DIAGNOSIS — D72829 Elevated white blood cell count, unspecified: Secondary | ICD-10-CM

## 2016-01-25 LAB — CBC WITH DIFFERENTIAL (CANCER CENTER ONLY)
BASO#: 0.1 10*3/uL (ref 0.0–0.2)
BASO%: 3.8 % — AB (ref 0.0–2.0)
EOS%: 0.4 % (ref 0.0–7.0)
Eosinophils Absolute: 0 10*3/uL (ref 0.0–0.5)
HCT: 29 % — ABNORMAL LOW (ref 34.8–46.6)
HGB: 9.7 g/dL — ABNORMAL LOW (ref 11.6–15.9)
LYMPH#: 1.6 10*3/uL (ref 0.9–3.3)
LYMPH%: 66 % — AB (ref 14.0–48.0)
MCH: 33.8 pg (ref 26.0–34.0)
MCHC: 33.4 g/dL (ref 32.0–36.0)
MCV: 101 fL (ref 81–101)
MONO#: 0.2 10*3/uL (ref 0.1–0.9)
MONO%: 6.8 % (ref 0.0–13.0)
NEUT#: 0.5 10*3/uL — CL (ref 1.5–6.5)
NEUT%: 23 % — AB (ref 39.6–80.0)
PLATELETS: 77 10*3/uL — AB (ref 145–400)
RBC: 2.87 10*6/uL — AB (ref 3.70–5.32)
RDW: 16 % — AB (ref 11.1–15.7)
WBC: 2.4 10*3/uL — AB (ref 3.9–10.0)

## 2016-01-25 LAB — CMP (CANCER CENTER ONLY)
ALT(SGPT): 28 U/L (ref 10–47)
AST: 31 U/L (ref 11–38)
Albumin: 3.6 g/dL (ref 3.3–5.5)
Alkaline Phosphatase: 56 U/L (ref 26–84)
BILIRUBIN TOTAL: 0.7 mg/dL (ref 0.20–1.60)
BUN, Bld: 18 mg/dL (ref 7–22)
CALCIUM: 9.4 mg/dL (ref 8.0–10.3)
CHLORIDE: 102 meq/L (ref 98–108)
CO2: 29 meq/L (ref 18–33)
Creat: 1.1 mg/dl (ref 0.6–1.2)
GLUCOSE: 116 mg/dL (ref 73–118)
Potassium: 3.8 mEq/L (ref 3.3–4.7)
Sodium: 137 mEq/L (ref 128–145)
Total Protein: 6.5 g/dL (ref 6.4–8.1)

## 2016-01-25 LAB — LACTATE DEHYDROGENASE: LDH: 250 U/L — ABNORMAL HIGH (ref 125–245)

## 2016-01-25 LAB — URIC ACID: URIC ACID, SERUM: 3.1 mg/dL (ref 2.6–7.4)

## 2016-01-25 MED ORDER — SODIUM CHLORIDE 0.9% FLUSH
10.0000 mL | INTRAVENOUS | Status: AC | PRN
Start: 2016-01-25 — End: 2016-01-25
  Administered 2016-01-25: 10 mL
  Filled 2016-01-25: qty 10

## 2016-01-25 MED ORDER — HEPARIN SOD (PORK) LOCK FLUSH 100 UNIT/ML IV SOLN
500.0000 [IU] | INTRAVENOUS | Status: AC | PRN
  Administered 2016-01-25: 500 [IU]
  Filled 2016-01-25: qty 5

## 2016-01-25 NOTE — Progress Notes (Signed)
Hematology and Oncology Follow Up Visit  Kristina Meyer [id number] [birth year] 75 y.o. 01/25/2016   Principle Diagnosis:   Acute monocytic leukemia - normal cytogenetics  Current Therapy:    Status post cycle 1 of Dacogen-7 day cycle     Interim History:  Kristina Meyer is  back for follow-up. She really looks good. She tolerated her first cycle of Dacogen very very nicely. She has shown a very tough resilience.  She had no nausea or vomiting with the Dacogen. There is no bleeding. She's had no fever.  She is on allopurinol. Also have her on Bactrim.  She's had no diarrhea. There's been no mouth sores. She's had no ecchymoses. She said that some of the ecchymoses that she had had in the past are resolved.  There is no headache. She's had no cough or shortness of breath.  Overall, I was able performance status is ECOG 1.  Medications:  Current Outpatient Prescriptions:  .  allopurinol (ZYLOPRIM) 100 MG tablet, Take 1 tablet (100 mg total) by mouth daily., Disp: 30 tablet, Rfl: 1 .  ALPRAZolam (XANAX) 0.5 MG tablet, Take 1 tablet (0.5 mg total) by mouth 3 (three) times daily as needed for anxiety., Disp: 60 tablet, Rfl: 2 .  aspirin 81 MG tablet, Take by mouth., Disp: , Rfl:  .  atorvastatin (LIPITOR) 20 MG tablet, Take 10 mg by mouth at bedtime., Disp: , Rfl:  .  Calcium-Magnesium-Vitamin D (CALCIUM 1200+D3 PO), Take 1 tablet by mouth daily., Disp: , Rfl:  .  cholecalciferol (VITAMIN D) 1000 UNITS tablet, Take 1,000 Units by mouth daily., Disp: , Rfl:  .  [START ON 12/08/2016] hydrochlorothiazide (HYDRODIURIL) 25 MG tablet, Take by mouth., Disp: , Rfl:  .  lidocaine-prilocaine (EMLA) cream, Apply 1 application topically as needed. Place quarter sized amount of cream directly on portacath and cover with saran wrap at least 1 - 1 1/2 hours prior to procedure., Disp: 30 g, Rfl: 3 .  Multiple Vitamin (MULTIVITAMIN) LIQD, Take 5 mLs by mouth daily., Disp: , Rfl:  .  Multiple Vitamins-Minerals  (MULTIVITAMIN ADULT) TABS, Take 1 tablet by mouth daily., Disp: , Rfl:  .  orphenadrine (NORFLEX) 100 MG tablet, Take 1 tablet (100 mg total) by mouth at bedtime as needed for muscle spasms., Disp: 30 tablet, Rfl: 2 .  PARoxetine (PAXIL) 20 MG tablet, Take 20 mg by mouth daily., Disp: , Rfl:  .  prochlorperazine (COMPAZINE) 5 MG tablet, Take 1 tablet (5 mg total) by mouth every 6 (six) hours as needed for nausea or vomiting., Disp: 60 tablet, Rfl: 0 .  sulfamethoxazole-trimethoprim (BACTRIM DS) 800-160 MG per tablet, Take 1 tablet by mouth daily., Disp: , Rfl:   Allergies:  Allergies  Allergen Reactions  . Other Other (See Comments)    GENERAL Anesthesia, vomiting    Past Medical History, Surgical history, Social history, and Family History were reviewed and updated.  Review of Systems:  As above  Physical Exam:  height is 5' 2.5" (1.588 m) and weight is 140 lb 1.3 oz (63.5 kg). Her oral temperature is 97.9 F (36.6 C). Her blood pressure is 141/60 (abnormal) and her pulse is 76. Her respiration is 18.   Wt Readings from Last 3 Encounters:  01/25/16 140 lb 1.3 oz (63.5 kg)  01/11/16 137 lb (62.1 kg)  01/05/16 137 lb (62.1 kg)      Well-developed and well-nourished white female in no obvious distress. Head and neck exam shows no ocular or oral lesions. There  are no palpable cervical or supra clavicular lymph nodes. Lungs are clear bilaterally. Cardiac exam regular rate and rhythm with no murmurs, rubs or bruits. Abdomen is soft. She has good bowel sounds. There is no fluid wave. There is no palpable liver or spleen tip. Back exam shows no tenderness over the spine, ribs or hips. Externa shows no clubbing, cyanosis or edema. She has good range of motion of her joints. Neurological exam shows no focal neurological deficits.  Lab Results  Component Value Date   WBC 2.4 (L) 01/25/2016   HGB 9.7 (L) 01/25/2016   HCT 29.0 (L) 01/25/2016   MCV 101 01/25/2016   PLT 77 (L) 01/25/2016      Chemistry      Component Value Date/Time   NA 137 01/25/2016 1250   NA 140 12/21/2015 1401   K 3.8 01/25/2016 1250   K 3.2 (L) 12/21/2015 1401   CL 102 01/25/2016 1250   CO2 29 01/25/2016 1250   CO2 29 12/21/2015 1401   BUN 18 01/25/2016 1250   BUN 21.5 12/21/2015 1401   CREATININE 1.1 01/25/2016 1250   CREATININE 1.4 (H) 12/21/2015 1401      Component Value Date/Time   CALCIUM 9.4 01/25/2016 1250   CALCIUM 9.6 12/21/2015 1401   ALKPHOS 56 01/25/2016 1250   ALKPHOS 68 12/21/2015 1401   AST 31 01/25/2016 1250   AST 36 (H) 12/21/2015 1401   ALT 28 01/25/2016 1250   ALT 26 12/21/2015 1401   BILITOT 0.70 01/25/2016 1250   BILITOT <0.30 12/21/2015 1401         Impression and Plan: Kristina Meyer is  A 75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 Her cytogenetics are normal which I would think would be a good prognostic sign. I may have to send off  NGS studies to see there is any genetic mutations.  I'm just glad that she is not sick. Again, she had a very unusual presentation which we repleted do not see any blasts in her peripheral blood.  For now, I want to have her come back weekly for lab work. If she is due for her second cycle of treatment in 3 weeks.  I told her that she can always call us if she does not feel good.     Volanda Napoleon, MD 8/30/20172:42 PM

## 2016-01-25 NOTE — Addendum Note (Signed)
Addended by: Burney Gauze R on: 01/25/2016 04:48 PM   Modules accepted: Orders

## 2016-01-25 NOTE — Patient Instructions (Signed)

## 2016-02-01 ENCOUNTER — Ambulatory Visit (HOSPITAL_BASED_OUTPATIENT_CLINIC_OR_DEPARTMENT_OTHER): Payer: Medicare Other

## 2016-02-01 ENCOUNTER — Other Ambulatory Visit: Payer: Medicare Other

## 2016-02-01 ENCOUNTER — Other Ambulatory Visit (HOSPITAL_BASED_OUTPATIENT_CLINIC_OR_DEPARTMENT_OTHER): Payer: Medicare Other

## 2016-02-01 DIAGNOSIS — C9300 Acute monoblastic/monocytic leukemia, not having achieved remission: Secondary | ICD-10-CM | POA: Diagnosis not present

## 2016-02-01 LAB — CMP (CANCER CENTER ONLY)
ALBUMIN: 3.7 g/dL (ref 3.3–5.5)
ALK PHOS: 59 U/L (ref 26–84)
ALT: 26 U/L (ref 10–47)
AST: 32 U/L (ref 11–38)
BILIRUBIN TOTAL: 0.7 mg/dL (ref 0.20–1.60)
BUN, Bld: 11 mg/dL (ref 7–22)
CALCIUM: 9 mg/dL (ref 8.0–10.3)
CO2: 29 meq/L (ref 18–33)
CREATININE: 1 mg/dL (ref 0.6–1.2)
Chloride: 102 mEq/L (ref 98–108)
Glucose, Bld: 111 mg/dL (ref 73–118)
Potassium: 3.5 mEq/L (ref 3.3–4.7)
SODIUM: 134 meq/L (ref 128–145)
TOTAL PROTEIN: 6.6 g/dL (ref 6.4–8.1)

## 2016-02-01 LAB — CBC WITH DIFFERENTIAL (CANCER CENTER ONLY)
BASO#: 0.1 10*3/uL (ref 0.0–0.2)
BASO%: 4.7 % — AB (ref 0.0–2.0)
EOS%: 0.7 % (ref 0.0–7.0)
Eosinophils Absolute: 0 10*3/uL (ref 0.0–0.5)
HEMATOCRIT: 28.9 % — AB (ref 34.8–46.6)
HEMOGLOBIN: 9.6 g/dL — AB (ref 11.6–15.9)
LYMPH#: 1.9 10*3/uL (ref 0.9–3.3)
LYMPH%: 67.8 % — ABNORMAL HIGH (ref 14.0–48.0)
MCH: 33.6 pg (ref 26.0–34.0)
MCHC: 33.2 g/dL (ref 32.0–36.0)
MCV: 101 fL (ref 81–101)
MONO#: 0.1 10*3/uL (ref 0.1–0.9)
MONO%: 4 % (ref 0.0–13.0)
NEUT%: 22.8 % — ABNORMAL LOW (ref 39.6–80.0)
NEUTROS ABS: 0.6 10*3/uL — AB (ref 1.5–6.5)
Platelets: 89 10*3/uL — ABNORMAL LOW (ref 145–400)
RBC: 2.86 10*6/uL — AB (ref 3.70–5.32)
RDW: 16.7 % — ABNORMAL HIGH (ref 11.1–15.7)
WBC: 2.8 10*3/uL — AB (ref 3.9–10.0)

## 2016-02-01 MED ORDER — SODIUM CHLORIDE 0.9% FLUSH
10.0000 mL | INTRAVENOUS | Status: DC | PRN
  Administered 2016-02-01: 10 mL via INTRAVENOUS
  Filled 2016-02-01: qty 10

## 2016-02-01 MED ORDER — HEPARIN SOD (PORK) LOCK FLUSH 100 UNIT/ML IV SOLN
500.0000 [IU] | Freq: Once | INTRAVENOUS | Status: AC
  Administered 2016-02-01: 500 [IU] via INTRAVENOUS
  Filled 2016-02-01: qty 5

## 2016-02-01 NOTE — Patient Instructions (Signed)

## 2016-02-08 ENCOUNTER — Other Ambulatory Visit (HOSPITAL_BASED_OUTPATIENT_CLINIC_OR_DEPARTMENT_OTHER): Payer: Medicare Other

## 2016-02-08 ENCOUNTER — Ambulatory Visit (HOSPITAL_BASED_OUTPATIENT_CLINIC_OR_DEPARTMENT_OTHER): Payer: Medicare Other

## 2016-02-08 ENCOUNTER — Other Ambulatory Visit: Payer: Medicare Other

## 2016-02-08 VITALS — BP 152/67 | HR 75 | Temp 98.2°F | Resp 20

## 2016-02-08 DIAGNOSIS — C9300 Acute monoblastic/monocytic leukemia, not having achieved remission: Secondary | ICD-10-CM

## 2016-02-08 DIAGNOSIS — Z452 Encounter for adjustment and management of vascular access device: Secondary | ICD-10-CM | POA: Diagnosis not present

## 2016-02-08 LAB — CBC WITH DIFFERENTIAL (CANCER CENTER ONLY)
HEMATOCRIT: 29.9 % — AB (ref 34.8–46.6)
HGB: 10 g/dL — ABNORMAL LOW (ref 11.6–15.9)
MCH: 34 pg (ref 26.0–34.0)
MCHC: 33.4 g/dL (ref 32.0–36.0)
MCV: 102 fL — ABNORMAL HIGH (ref 81–101)
PLATELETS: 200 10*3/uL (ref 145–400)
RBC: 2.94 10*6/uL — ABNORMAL LOW (ref 3.70–5.32)
RDW: 18.1 % — AB (ref 11.1–15.7)
WBC: 3.5 10*3/uL — AB (ref 3.9–10.0)

## 2016-02-08 LAB — COMPREHENSIVE METABOLIC PANEL
ALT: 22 U/L (ref 0–55)
AST: 34 U/L (ref 5–34)
Albumin: 4.1 g/dL (ref 3.5–5.0)
Alkaline Phosphatase: 67 U/L (ref 40–150)
Anion Gap: 8 mEq/L (ref 3–11)
BUN: 11.3 mg/dL (ref 7.0–26.0)
CALCIUM: 9.2 mg/dL (ref 8.4–10.4)
CHLORIDE: 107 meq/L (ref 98–109)
CO2: 24 meq/L (ref 22–29)
CREATININE: 0.8 mg/dL (ref 0.6–1.1)
EGFR: 71 mL/min/{1.73_m2} — ABNORMAL LOW (ref >90)
Glucose: 90 mg/dl (ref 70–140)
POTASSIUM: 4.1 meq/L (ref 3.5–5.1)
Sodium: 139 mEq/L (ref 136–145)
Total Bilirubin: 0.6 mg/dL (ref 0.20–1.20)
Total Protein: 7 g/dL (ref 6.4–8.3)

## 2016-02-08 LAB — MANUAL DIFFERENTIAL (CHCC SATELLITE)
ALC: 2.4 10*3/uL — AB (ref 0.6–2.2)
ANC (CHCC MAN DIFF): 1 10*3/uL — AB (ref 1.5–6.7)
LYMPH: 69 % — AB (ref 14–48)
MONO: 3 % (ref 0–13)
PLT EST ~~LOC~~: ADEQUATE
SEG: 28 % — ABNORMAL LOW (ref 40–75)
nRBC: 2 % — ABNORMAL HIGH (ref 0–0)

## 2016-02-08 MED ORDER — HEPARIN SOD (PORK) LOCK FLUSH 100 UNIT/ML IV SOLN
500.0000 [IU] | Freq: Once | INTRAVENOUS | Status: AC
  Administered 2016-02-08: 500 [IU] via INTRAVENOUS
  Filled 2016-02-08: qty 5

## 2016-02-08 MED ORDER — SODIUM CHLORIDE 0.9% FLUSH
10.0000 mL | INTRAVENOUS | Status: DC | PRN
  Administered 2016-02-08: 10 mL via INTRAVENOUS
  Filled 2016-02-08: qty 10

## 2016-02-08 NOTE — Patient Instructions (Signed)

## 2016-02-10 ENCOUNTER — Encounter: Payer: Self-pay | Admitting: Hematology & Oncology

## 2016-02-13 ENCOUNTER — Other Ambulatory Visit (HOSPITAL_BASED_OUTPATIENT_CLINIC_OR_DEPARTMENT_OTHER): Payer: Medicare Other

## 2016-02-13 ENCOUNTER — Ambulatory Visit (HOSPITAL_BASED_OUTPATIENT_CLINIC_OR_DEPARTMENT_OTHER): Payer: Medicare Other

## 2016-02-13 ENCOUNTER — Encounter: Payer: Self-pay | Admitting: Hematology & Oncology

## 2016-02-13 ENCOUNTER — Ambulatory Visit: Payer: Medicare Other

## 2016-02-13 ENCOUNTER — Ambulatory Visit (HOSPITAL_BASED_OUTPATIENT_CLINIC_OR_DEPARTMENT_OTHER): Payer: Medicare Other | Admitting: Hematology & Oncology

## 2016-02-13 VITALS — BP 131/57 | HR 96 | Temp 98.3°F | Resp 18 | Ht 62.5 in | Wt 136.0 lb

## 2016-02-13 DIAGNOSIS — C9300 Acute monoblastic/monocytic leukemia, not having achieved remission: Secondary | ICD-10-CM | POA: Diagnosis not present

## 2016-02-13 DIAGNOSIS — C9310 Chronic myelomonocytic leukemia not having achieved remission: Secondary | ICD-10-CM

## 2016-02-13 DIAGNOSIS — Z5111 Encounter for antineoplastic chemotherapy: Secondary | ICD-10-CM | POA: Diagnosis not present

## 2016-02-13 DIAGNOSIS — D72829 Elevated white blood cell count, unspecified: Secondary | ICD-10-CM

## 2016-02-13 LAB — CBC WITH DIFFERENTIAL (CANCER CENTER ONLY)
BASO#: 0.2 10*3/uL (ref 0.0–0.2)
BASO%: 5 % — ABNORMAL HIGH (ref 0.0–2.0)
EOS ABS: 0 10*3/uL (ref 0.0–0.5)
EOS%: 0.7 % (ref 0.0–7.0)
HEMATOCRIT: 31.2 % — AB (ref 34.8–46.6)
HEMOGLOBIN: 10.6 g/dL — AB (ref 11.6–15.9)
LYMPH#: 2.4 10*3/uL (ref 0.9–3.3)
LYMPH%: 55.2 % — ABNORMAL HIGH (ref 14.0–48.0)
MCH: 34.6 pg — AB (ref 26.0–34.0)
MCHC: 34 g/dL (ref 32.0–36.0)
MCV: 102 fL — AB (ref 81–101)
MONO#: 0.2 10*3/uL (ref 0.1–0.9)
MONO%: 3.4 % (ref 0.0–13.0)
NEUT%: 35.7 % — ABNORMAL LOW (ref 39.6–80.0)
NEUTROS ABS: 1.6 10*3/uL (ref 1.5–6.5)
Platelets: 201 10*3/uL (ref 145–400)
RBC: 3.06 10*6/uL — ABNORMAL LOW (ref 3.70–5.32)
RDW: 18 % — ABNORMAL HIGH (ref 11.1–15.7)
WBC: 4.4 10*3/uL (ref 3.9–10.0)

## 2016-02-13 LAB — CMP (CANCER CENTER ONLY)
ALBUMIN: 4.1 g/dL (ref 3.3–5.5)
ALK PHOS: 63 U/L (ref 26–84)
ALT(SGPT): 29 U/L (ref 10–47)
AST: 42 U/L — AB (ref 11–38)
BILIRUBIN TOTAL: 1 mg/dL (ref 0.20–1.60)
BUN, Bld: 17 mg/dL (ref 7–22)
CALCIUM: 9.1 mg/dL (ref 8.0–10.3)
CO2: 32 meq/L (ref 18–33)
Chloride: 95 mEq/L — ABNORMAL LOW (ref 98–108)
Creat: 0.9 mg/dl (ref 0.6–1.2)
Glucose, Bld: 150 mg/dL — ABNORMAL HIGH (ref 73–118)
Potassium: 2.8 mEq/L — CL (ref 3.3–4.7)
Sodium: 129 mEq/L (ref 128–145)
Total Protein: 6.6 g/dL (ref 6.4–8.1)

## 2016-02-13 LAB — LACTATE DEHYDROGENASE: LDH: 267 U/L — ABNORMAL HIGH (ref 125–245)

## 2016-02-13 LAB — TECHNOLOGIST REVIEW CHCC SATELLITE

## 2016-02-13 MED ORDER — POTASSIUM CHLORIDE CRYS ER 20 MEQ PO TBCR: EXTENDED_RELEASE_TABLET | ORAL | 3 refills | Status: DC

## 2016-02-13 MED ORDER — SODIUM CHLORIDE 0.9 % IV SOLN
16.0000 mg/m2 | Freq: Once | INTRAVENOUS | Status: AC
  Administered 2016-02-13: 25 mg via INTRAVENOUS
  Filled 2016-02-13: qty 5

## 2016-02-13 MED ORDER — HEPARIN SOD (PORK) LOCK FLUSH 100 UNIT/ML IV SOLN
500.0000 [IU] | Freq: Once | INTRAVENOUS | Status: AC | PRN
  Administered 2016-02-13: 500 [IU]
  Filled 2016-02-13: qty 5

## 2016-02-13 MED ORDER — SODIUM CHLORIDE 0.9% FLUSH
10.0000 mL | INTRAVENOUS | Status: DC | PRN
  Administered 2016-02-13: 10 mL
  Filled 2016-02-13: qty 10

## 2016-02-13 MED ORDER — PROCHLORPERAZINE MALEATE 10 MG PO TABS
10.0000 mg | ORAL_TABLET | Freq: Once | ORAL | Status: AC
  Administered 2016-02-13: 10 mg via ORAL

## 2016-02-13 MED ORDER — PROCHLORPERAZINE MALEATE 10 MG PO TABS
ORAL_TABLET | ORAL | Status: AC
  Filled 2016-02-13: qty 1

## 2016-02-13 MED ORDER — SODIUM CHLORIDE 0.9 % IV SOLN
Freq: Once | INTRAVENOUS | Status: AC
  Administered 2016-02-13: 15:00:00 via INTRAVENOUS

## 2016-02-13 NOTE — Addendum Note (Signed)
Addended by: Burney Gauze R on: 02/13/2016 02:57 PM   Modules accepted: Orders

## 2016-02-13 NOTE — Progress Notes (Signed)
Hematology and Oncology Follow Up Visit  Kristina Meyer [id number] May 20, 1941 75 y.o. 02/13/2016   Principle Diagnosis:   Acute monocytic leukemia - normal cytogenetics  Current Therapy:    Status post cycle 1 of Dacogen-7 day cycle     Interim History:  Kristina Meyer is  back for follow-up. She really looks good. She tolerated her first cycle of Dacogen very very nicely. She has shown a very tough resilience.  She had no nausea or vomiting with the Dacogen. There is no bleeding. She's had no fever.  We have not had to transfuse her. Her platelet has been coming up. Everything has looked encouraging with respect to a response. Glenford Peers that she is responding.  We try to get NGS on her peripheral blood. We cannot because there were no circulating leukemia cells. Again, I have to believe this is very encouraging.  Her appetite is good. She has had no nausea or vomiting. She has had no diarrhea. There's been no constipation.  She's not noted any leg swelling. She's had no headache. She's had no mouth sores.  Overall, her performance status has to be ECOG 1.  Medications:  Current Outpatient Prescriptions:  .  allopurinol (ZYLOPRIM) 100 MG tablet, Take 1 tablet (100 mg total) by mouth daily., Disp: 30 tablet, Rfl: 1 .  ALPRAZolam (XANAX) 0.5 MG tablet, Take 1 tablet (0.5 mg total) by mouth 3 (three) times daily as needed for anxiety., Disp: 60 tablet, Rfl: 2 .  aspirin 81 MG tablet, Take by mouth., Disp: , Rfl:  .  atorvastatin (LIPITOR) 20 MG tablet, Take 10 mg by mouth at bedtime., Disp: , Rfl:  .  Calcium-Magnesium-Vitamin D (CALCIUM 1200+D3 PO), Take 1 tablet by mouth daily., Disp: , Rfl:  .  cholecalciferol (VITAMIN D) 1000 UNITS tablet, Take 1,000 Units by mouth daily., Disp: , Rfl:  .  [START ON 12/08/2016] hydrochlorothiazide (HYDRODIURIL) 25 MG tablet, Take by mouth., Disp: , Rfl:  .  lidocaine-prilocaine (EMLA) cream, Apply 1 application topically as needed. Place quarter sized  amount of cream directly on portacath and cover with saran wrap at least 1 - 1 1/2 hours prior to procedure., Disp: 30 g, Rfl: 3 .  Multiple Vitamin (MULTIVITAMIN) LIQD, Take 5 mLs by mouth daily., Disp: , Rfl:  .  Multiple Vitamins-Minerals (MULTIVITAMIN ADULT) TABS, Take 1 tablet by mouth daily., Disp: , Rfl:  .  orphenadrine (NORFLEX) 100 MG tablet, Take 1 tablet (100 mg total) by mouth at bedtime as needed for muscle spasms., Disp: 30 tablet, Rfl: 2 .  PARoxetine (PAXIL) 20 MG tablet, Take 20 mg by mouth daily., Disp: , Rfl:  .  prochlorperazine (COMPAZINE) 5 MG tablet, Take 1 tablet (5 mg total) by mouth every 6 (six) hours as needed for nausea or vomiting., Disp: 60 tablet, Rfl: 0 .  sulfamethoxazole-trimethoprim (BACTRIM DS) 800-160 MG per tablet, Take 1 tablet by mouth daily., Disp: , Rfl:   Allergies:  Allergies  Allergen Reactions  . Other Other (See Comments)    GENERAL Anesthesia, vomiting    Past Medical History, Surgical history, Social history, and Family History were reviewed and updated.  Review of Systems:  As above  Physical Exam:  height is 5' 2.5" (1.588 m) and weight is 136 lb (61.7 kg). Her oral temperature is 98.3 F (36.8 C). Her blood pressure is 131/57 (abnormal) and her pulse is 96. Her respiration is 18.   Wt Readings from Last 3 Encounters:  02/13/16 136 lb (61.7 kg)  01/25/16 140 lb 1.3 oz (63.5 kg)  01/11/16 137 lb (62.1 kg)      Well-developed and well-nourished white female in no obvious distress. Head and neck exam shows no ocular or oral lesions. There are no palpable cervical or supra clavicular lymph nodes. Lungs are clear bilaterally. Cardiac exam regular rate and rhythm with no murmurs, rubs or bruits. Abdomen is soft. She has good bowel sounds. There is no fluid wave. There is no palpable liver or spleen tip. Back exam shows no tenderness over the spine, ribs or hips. Externa shows no clubbing, cyanosis or edema. She has good range of motion of  her joints. Neurological exam shows no focal neurological deficits.  Lab Results  Component Value Date   WBC 3.5 (L) 02/08/2016   HGB 10.0 (L) 02/08/2016   HCT 29.9 (L) 02/08/2016   MCV 102 (H) 02/08/2016   PLT 200 02/08/2016     Chemistry      Component Value Date/Time   NA 139 02/08/2016 1009   K 4.1 02/08/2016 1009   CL 102 02/01/2016 1320   CO2 24 02/08/2016 1009   BUN 11.3 02/08/2016 1009   CREATININE 0.8 02/08/2016 1009      Component Value Date/Time   CALCIUM 9.2 02/08/2016 1009   ALKPHOS 67 02/08/2016 1009   AST 34 02/08/2016 1009   ALT 22 02/08/2016 1009   BILITOT 0.60 02/08/2016 1009         Impression and Plan: Ms. 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 Her cytogenetics are normal which I would think would be a good prognostic sign.   I had to be encouraged by the fact that her platelet count is up now. Her hemoglobin is above 10. Her white cell count has come down quite nicely. She is no longer bruising.  We'll proceed with her second cycle of treatment. Again she is tolerating this quite well.   I told her to stop the allopurinol. I really do not think that she really needs this now as tumor lysis is minimal.  We will have her come back in 2 weeks for follow-up and labs.     Volanda Napoleon, MD 9/18/20171:48 PM

## 2016-02-13 NOTE — Patient Instructions (Signed)
[facility] Discharge Instructions for Patients Receiving Chemotherapy  Today you received the following chemotherapy agents Decitabine  To help prevent nausea and vomiting after your treatment, we encourage you to take your nausea medication    If you develop nausea and vomiting that is not controlled by your nausea medication, call the clinic.   BELOW ARE SYMPTOMS THAT SHOULD BE REPORTED IMMEDIATELY:  *FEVER GREATER THAN 100.5 F  *CHILLS WITH OR WITHOUT FEVER  NAUSEA AND VOMITING THAT IS NOT CONTROLLED WITH YOUR NAUSEA MEDICATION  *UNUSUAL SHORTNESS OF BREATH  *UNUSUAL BRUISING OR BLEEDING  TENDERNESS IN MOUTH AND THROAT WITH OR WITHOUT PRESENCE OF ULCERS  *URINARY PROBLEMS  *BOWEL PROBLEMS  UNUSUAL RASH Items with * indicate a potential emergency and should be followed up as soon as possible.  Feel free to call the clinic you have any questions or concerns. The clinic phone number is [phone].  Please show the CHEMO ALERT CARD at check-in to the Emergency Department and triage nurse.   

## 2016-02-14 ENCOUNTER — Ambulatory Visit (HOSPITAL_BASED_OUTPATIENT_CLINIC_OR_DEPARTMENT_OTHER): Payer: Medicare Other

## 2016-02-14 VITALS — BP 117/53 | HR 94 | Temp 98.4°F | Resp 18

## 2016-02-14 DIAGNOSIS — Z5111 Encounter for antineoplastic chemotherapy: Secondary | ICD-10-CM | POA: Diagnosis not present

## 2016-02-14 DIAGNOSIS — C9300 Acute monoblastic/monocytic leukemia, not having achieved remission: Secondary | ICD-10-CM

## 2016-02-14 MED ORDER — SODIUM CHLORIDE 0.9% FLUSH
10.0000 mL | INTRAVENOUS | Status: DC | PRN
  Administered 2016-02-14: 10 mL
  Filled 2016-02-14: qty 10

## 2016-02-14 MED ORDER — SODIUM CHLORIDE 0.9 % IV SOLN
16.0000 mg/m2 | Freq: Once | INTRAVENOUS | Status: AC
  Administered 2016-02-14: 25 mg via INTRAVENOUS
  Filled 2016-02-14: qty 5

## 2016-02-14 MED ORDER — PROCHLORPERAZINE MALEATE 10 MG PO TABS
ORAL_TABLET | ORAL | Status: AC
Start: 2016-02-14 — End: 2016-02-14
  Filled 2016-02-14: qty 1

## 2016-02-14 MED ORDER — HEPARIN SOD (PORK) LOCK FLUSH 100 UNIT/ML IV SOLN
500.0000 [IU] | Freq: Once | INTRAVENOUS | Status: AC | PRN
  Administered 2016-02-14: 500 [IU]
  Filled 2016-02-14: qty 5

## 2016-02-14 MED ORDER — SODIUM CHLORIDE 0.9 % IV SOLN
Freq: Once | INTRAVENOUS | Status: AC
  Administered 2016-02-14: 11:00:00 via INTRAVENOUS

## 2016-02-14 MED ORDER — PROCHLORPERAZINE MALEATE 10 MG PO TABS
10.0000 mg | ORAL_TABLET | Freq: Once | ORAL | Status: AC
  Administered 2016-02-14: 10 mg via ORAL

## 2016-02-14 NOTE — Patient Instructions (Signed)
[facility] Discharge Instructions for Patients Receiving Chemotherapy  Today you received the following chemotherapy agents Decitabine  To help prevent nausea and vomiting after your treatment, we encourage you to take your nausea medication    If you develop nausea and vomiting that is not controlled by your nausea medication, call the clinic.   BELOW ARE SYMPTOMS THAT SHOULD BE REPORTED IMMEDIATELY:  *FEVER GREATER THAN 100.5 F  *CHILLS WITH OR WITHOUT FEVER  NAUSEA AND VOMITING THAT IS NOT CONTROLLED WITH YOUR NAUSEA MEDICATION  *UNUSUAL SHORTNESS OF BREATH  *UNUSUAL BRUISING OR BLEEDING  TENDERNESS IN MOUTH AND THROAT WITH OR WITHOUT PRESENCE OF ULCERS  *URINARY PROBLEMS  *BOWEL PROBLEMS  UNUSUAL RASH Items with * indicate a potential emergency and should be followed up as soon as possible.  Feel free to call the clinic you have any questions or concerns. The clinic phone number is [phone].  Please show the CHEMO ALERT CARD at check-in to the Emergency Department and triage nurse.   

## 2016-02-15 ENCOUNTER — Ambulatory Visit (HOSPITAL_BASED_OUTPATIENT_CLINIC_OR_DEPARTMENT_OTHER): Payer: Medicare Other

## 2016-02-15 ENCOUNTER — Other Ambulatory Visit: Payer: Medicare Other

## 2016-02-15 VITALS — BP 124/54 | HR 76 | Temp 97.6°F | Resp 20

## 2016-02-15 DIAGNOSIS — Z5111 Encounter for antineoplastic chemotherapy: Secondary | ICD-10-CM

## 2016-02-15 DIAGNOSIS — C9300 Acute monoblastic/monocytic leukemia, not having achieved remission: Secondary | ICD-10-CM

## 2016-02-15 MED ORDER — SODIUM CHLORIDE 0.9 % IV SOLN
Freq: Once | INTRAVENOUS | Status: AC
  Administered 2016-02-15: 12:00:00 via INTRAVENOUS

## 2016-02-15 MED ORDER — PROCHLORPERAZINE MALEATE 10 MG PO TABS
ORAL_TABLET | ORAL | Status: AC
  Filled 2016-02-15: qty 1

## 2016-02-15 MED ORDER — HEPARIN SOD (PORK) LOCK FLUSH 100 UNIT/ML IV SOLN
500.0000 [IU] | Freq: Once | INTRAVENOUS | Status: AC | PRN
  Administered 2016-02-15: 500 [IU]
  Filled 2016-02-15: qty 5

## 2016-02-15 MED ORDER — SODIUM CHLORIDE 0.9% FLUSH
10.0000 mL | INTRAVENOUS | Status: DC | PRN
  Administered 2016-02-15: 10 mL
  Filled 2016-02-15: qty 10

## 2016-02-15 MED ORDER — PROCHLORPERAZINE MALEATE 10 MG PO TABS
10.0000 mg | ORAL_TABLET | Freq: Once | ORAL | Status: AC
  Administered 2016-02-15: 10 mg via ORAL

## 2016-02-15 MED ORDER — SODIUM CHLORIDE 0.9 % IV SOLN
16.0000 mg/m2 | Freq: Once | INTRAVENOUS | Status: AC
  Administered 2016-02-15: 25 mg via INTRAVENOUS
  Filled 2016-02-15: qty 5

## 2016-02-15 NOTE — Patient Instructions (Signed)
[facility] Discharge Instructions for Patients Receiving Chemotherapy  Today you received the following chemotherapy agents Decitabine  To help prevent nausea and vomiting after your treatment, we encourage you to take your nausea medication    If you develop nausea and vomiting that is not controlled by your nausea medication, call the clinic.   BELOW ARE SYMPTOMS THAT SHOULD BE REPORTED IMMEDIATELY:  *FEVER GREATER THAN 100.5 F  *CHILLS WITH OR WITHOUT FEVER  NAUSEA AND VOMITING THAT IS NOT CONTROLLED WITH YOUR NAUSEA MEDICATION  *UNUSUAL SHORTNESS OF BREATH  *UNUSUAL BRUISING OR BLEEDING  TENDERNESS IN MOUTH AND THROAT WITH OR WITHOUT PRESENCE OF ULCERS  *URINARY PROBLEMS  *BOWEL PROBLEMS  UNUSUAL RASH Items with * indicate a potential emergency and should be followed up as soon as possible.  Feel free to call the clinic you have any questions or concerns. The clinic phone number is [phone].  Please show the CHEMO ALERT CARD at check-in to the Emergency Department and triage nurse.   

## 2016-02-16 ENCOUNTER — Ambulatory Visit (HOSPITAL_BASED_OUTPATIENT_CLINIC_OR_DEPARTMENT_OTHER): Payer: Medicare Other

## 2016-02-16 VITALS — BP 121/60 | HR 84 | Temp 98.4°F | Resp 20

## 2016-02-16 DIAGNOSIS — C9300 Acute monoblastic/monocytic leukemia, not having achieved remission: Secondary | ICD-10-CM | POA: Diagnosis not present

## 2016-02-16 DIAGNOSIS — Z5111 Encounter for antineoplastic chemotherapy: Secondary | ICD-10-CM | POA: Diagnosis not present

## 2016-02-16 MED ORDER — SODIUM CHLORIDE 0.9% FLUSH
10.0000 mL | INTRAVENOUS | Status: DC | PRN
  Administered 2016-02-16: 10 mL
  Filled 2016-02-16: qty 10

## 2016-02-16 MED ORDER — PROCHLORPERAZINE MALEATE 10 MG PO TABS
ORAL_TABLET | ORAL | Status: AC
  Filled 2016-02-16: qty 1

## 2016-02-16 MED ORDER — SODIUM CHLORIDE 0.9 % IV SOLN
Freq: Once | INTRAVENOUS | Status: AC
  Administered 2016-02-16: 11:00:00 via INTRAVENOUS

## 2016-02-16 MED ORDER — SODIUM CHLORIDE 0.9 % IV SOLN
16.0000 mg/m2 | Freq: Once | INTRAVENOUS | Status: AC
  Administered 2016-02-16: 25 mg via INTRAVENOUS
  Filled 2016-02-16: qty 5

## 2016-02-16 MED ORDER — HEPARIN SOD (PORK) LOCK FLUSH 100 UNIT/ML IV SOLN
500.0000 [IU] | Freq: Once | INTRAVENOUS | Status: AC | PRN
  Administered 2016-02-16: 500 [IU]
  Filled 2016-02-16: qty 5

## 2016-02-16 MED ORDER — PROCHLORPERAZINE MALEATE 10 MG PO TABS
10.0000 mg | ORAL_TABLET | Freq: Once | ORAL | Status: AC
  Administered 2016-02-16: 10 mg via ORAL

## 2016-02-16 NOTE — Patient Instructions (Signed)
[facility] Discharge Instructions for Patients Receiving Chemotherapy  Today you received the following chemotherapy agents Decitabine  To help prevent nausea and vomiting after your treatment, we encourage you to take your nausea medication    If you develop nausea and vomiting that is not controlled by your nausea medication, call the clinic.   BELOW ARE SYMPTOMS THAT SHOULD BE REPORTED IMMEDIATELY:  *FEVER GREATER THAN 100.5 F  *CHILLS WITH OR WITHOUT FEVER  NAUSEA AND VOMITING THAT IS NOT CONTROLLED WITH YOUR NAUSEA MEDICATION  *UNUSUAL SHORTNESS OF BREATH  *UNUSUAL BRUISING OR BLEEDING  TENDERNESS IN MOUTH AND THROAT WITH OR WITHOUT PRESENCE OF ULCERS  *URINARY PROBLEMS  *BOWEL PROBLEMS  UNUSUAL RASH Items with * indicate a potential emergency and should be followed up as soon as possible.  Feel free to call the clinic you have any questions or concerns. The clinic phone number is [phone].  Please show the CHEMO ALERT CARD at check-in to the Emergency Department and triage nurse.   

## 2016-02-17 ENCOUNTER — Ambulatory Visit (HOSPITAL_BASED_OUTPATIENT_CLINIC_OR_DEPARTMENT_OTHER): Payer: Medicare Other

## 2016-02-17 VITALS — BP 107/64 | HR 85 | Temp 98.0°F | Resp 20

## 2016-02-17 DIAGNOSIS — C9300 Acute monoblastic/monocytic leukemia, not having achieved remission: Secondary | ICD-10-CM | POA: Diagnosis not present

## 2016-02-17 DIAGNOSIS — Z5111 Encounter for antineoplastic chemotherapy: Secondary | ICD-10-CM

## 2016-02-17 MED ORDER — HEPARIN SOD (PORK) LOCK FLUSH 100 UNIT/ML IV SOLN
500.0000 [IU] | Freq: Once | INTRAVENOUS | Status: AC | PRN
  Administered 2016-02-17: 500 [IU]
  Filled 2016-02-17: qty 5

## 2016-02-17 MED ORDER — PROCHLORPERAZINE MALEATE 10 MG PO TABS
ORAL_TABLET | ORAL | Status: AC
  Filled 2016-02-17: qty 1

## 2016-02-17 MED ORDER — SODIUM CHLORIDE 0.9% FLUSH
10.0000 mL | INTRAVENOUS | Status: DC | PRN
  Administered 2016-02-17: 10 mL
  Filled 2016-02-17: qty 10

## 2016-02-17 MED ORDER — SODIUM CHLORIDE 0.9 % IV SOLN
16.0000 mg/m2 | Freq: Once | INTRAVENOUS | Status: AC
  Administered 2016-02-17: 25 mg via INTRAVENOUS
  Filled 2016-02-17: qty 5

## 2016-02-17 MED ORDER — PROCHLORPERAZINE MALEATE 10 MG PO TABS
10.0000 mg | ORAL_TABLET | Freq: Once | ORAL | Status: AC
  Administered 2016-02-17: 10 mg via ORAL

## 2016-02-17 MED ORDER — SODIUM CHLORIDE 0.9 % IV SOLN
Freq: Once | INTRAVENOUS | Status: AC
  Administered 2016-02-17: 11:00:00 via INTRAVENOUS

## 2016-02-17 NOTE — Patient Instructions (Signed)
[facility] Discharge Instructions for Patients Receiving Chemotherapy  Today you received the following chemotherapy agents Decitabine  To help prevent nausea and vomiting after your treatment, we encourage you to take your nausea medication    If you develop nausea and vomiting that is not controlled by your nausea medication, call the clinic.   BELOW ARE SYMPTOMS THAT SHOULD BE REPORTED IMMEDIATELY:  *FEVER GREATER THAN 100.5 F  *CHILLS WITH OR WITHOUT FEVER  NAUSEA AND VOMITING THAT IS NOT CONTROLLED WITH YOUR NAUSEA MEDICATION  *UNUSUAL SHORTNESS OF BREATH  *UNUSUAL BRUISING OR BLEEDING  TENDERNESS IN MOUTH AND THROAT WITH OR WITHOUT PRESENCE OF ULCERS  *URINARY PROBLEMS  *BOWEL PROBLEMS  UNUSUAL RASH Items with * indicate a potential emergency and should be followed up as soon as possible.  Feel free to call the clinic you have any questions or concerns. The clinic phone number is [phone].  Please show the CHEMO ALERT CARD at check-in to the Emergency Department and triage nurse.   

## 2016-02-20 ENCOUNTER — Ambulatory Visit (HOSPITAL_BASED_OUTPATIENT_CLINIC_OR_DEPARTMENT_OTHER): Payer: Medicare Other

## 2016-02-20 ENCOUNTER — Other Ambulatory Visit (HOSPITAL_BASED_OUTPATIENT_CLINIC_OR_DEPARTMENT_OTHER): Payer: Medicare Other

## 2016-02-20 VITALS — BP 110/56 | HR 75 | Temp 98.0°F | Resp 18

## 2016-02-20 DIAGNOSIS — Z5111 Encounter for antineoplastic chemotherapy: Secondary | ICD-10-CM | POA: Diagnosis not present

## 2016-02-20 DIAGNOSIS — C9300 Acute monoblastic/monocytic leukemia, not having achieved remission: Secondary | ICD-10-CM

## 2016-02-20 LAB — CBC WITH DIFFERENTIAL (CANCER CENTER ONLY)
HEMATOCRIT: 29 % — AB (ref 34.8–46.6)
HGB: 10 g/dL — ABNORMAL LOW (ref 11.6–15.9)
MCH: 35 pg — AB (ref 26.0–34.0)
MCHC: 34.5 g/dL (ref 32.0–36.0)
MCV: 101 fL (ref 81–101)
PLATELETS: 99 10*3/uL — AB (ref 145–400)
RBC: 2.86 10*6/uL — ABNORMAL LOW (ref 3.70–5.32)
RDW: 17.9 % — AB (ref 11.1–15.7)
WBC: 4.9 10*3/uL (ref 3.9–10.0)

## 2016-02-20 LAB — CMP (CANCER CENTER ONLY)
ALK PHOS: 66 U/L (ref 26–84)
ALT(SGPT): 33 U/L (ref 10–47)
AST: 38 U/L (ref 11–38)
Albumin: 4 g/dL (ref 3.3–5.5)
BUN: 17 mg/dL (ref 7–22)
CALCIUM: 9 mg/dL (ref 8.0–10.3)
CHLORIDE: 98 meq/L (ref 98–108)
CO2: 27 mEq/L (ref 18–33)
Creat: 1 mg/dl (ref 0.6–1.2)
GLUCOSE: 138 mg/dL — AB (ref 73–118)
POTASSIUM: 3.8 meq/L (ref 3.3–4.7)
Sodium: 132 mEq/L (ref 128–145)
TOTAL PROTEIN: 6.5 g/dL (ref 6.4–8.1)
Total Bilirubin: 0.9 mg/dl (ref 0.20–1.60)

## 2016-02-20 LAB — MANUAL DIFFERENTIAL (CHCC SATELLITE)
ALC: 1.5 10*3/uL (ref 0.6–2.2)
ANC (CHCC MAN DIFF): 2.6 10*3/uL (ref 1.5–6.7)
BASO: 1 % (ref 0–2)
Band Neutrophils: 1 % (ref 0–10)
Eos: 2 % (ref 0–7)
LYMPH: 30 % (ref 14–48)
MONO: 6 % (ref 0–13)
NRBC: 3 % — AB (ref 0–0)
PLT EST ~~LOC~~: DECREASED
SEG: 52 % (ref 40–75)

## 2016-02-20 LAB — LACTATE DEHYDROGENASE: LDH: 277 U/L — AB (ref 125–245)

## 2016-02-20 LAB — CHCC SATELLITE - SMEAR

## 2016-02-20 MED ORDER — PROCHLORPERAZINE MALEATE 10 MG PO TABS
10.0000 mg | ORAL_TABLET | Freq: Once | ORAL | Status: AC
  Administered 2016-02-20: 10 mg via ORAL

## 2016-02-20 MED ORDER — PROCHLORPERAZINE MALEATE 10 MG PO TABS
ORAL_TABLET | ORAL | Status: AC
  Filled 2016-02-20: qty 1

## 2016-02-20 MED ORDER — HEPARIN SOD (PORK) LOCK FLUSH 100 UNIT/ML IV SOLN
500.0000 [IU] | Freq: Once | INTRAVENOUS | Status: AC | PRN
  Administered 2016-02-20: 500 [IU]
  Filled 2016-02-20: qty 5

## 2016-02-20 MED ORDER — SODIUM CHLORIDE 0.9 % IV SOLN
16.0000 mg/m2 | Freq: Once | INTRAVENOUS | Status: AC
  Administered 2016-02-20: 25 mg via INTRAVENOUS
  Filled 2016-02-20: qty 5

## 2016-02-20 MED ORDER — SODIUM CHLORIDE 0.9 % IV SOLN
Freq: Once | INTRAVENOUS | Status: AC
  Administered 2016-02-20: 13:00:00 via INTRAVENOUS

## 2016-02-20 MED ORDER — SODIUM CHLORIDE 0.9% FLUSH
10.0000 mL | INTRAVENOUS | Status: DC | PRN
  Administered 2016-02-20: 10 mL
  Filled 2016-02-20: qty 10

## 2016-02-20 NOTE — Patient Instructions (Signed)
Decitabine injection for infusion What is this medicine? DECITABINE (dee SYE ta been) is a chemotherapy drug. This medicine reduces the growth of cancer cells. It is used to treat adults with myelodysplastic syndromes. This medicine may be used for other purposes; ask your health care provider or pharmacist if you have questions. What should I tell my health care provider before I take this medicine? They need to know if you have any of these conditions: -infection (especially a virus infection such as chickenpox, cold sores, or herpes) -kidney disease -liver disease -an unusual or allergic reaction to decitabine, other medicines, foods, dyes, or preservatives -pregnant or trying to get pregnant -breast-feeding How should I use this medicine? This medicine is for infusion into a vein. It is administered in a hospital or clinic by a doctor or health care professional. Talk to your pediatrician regarding the use of this medicine in children. Special care may be needed. Overdosage: If you think you have taken too much of this medicine contact a poison control center or emergency room at once. NOTE: This medicine is only for you. Do not share this medicine with others. What if I miss a dose? It is important not to miss your dose. Call your doctor or health care professional if you are unable to keep an appointment. What may interact with this medicine? -vaccines Talk to your doctor or health care professional before taking any of these medicines: -aspirin -acetaminophen -ibuprofen -ketoprofen -naproxen This list may not describe all possible interactions. Give your health care provider a list of all the medicines, herbs, non-prescription drugs, or dietary supplements you use. Also tell them if you smoke, drink alcohol, or use illegal drugs. Some items may interact with your medicine. What should I watch for while using this medicine? Visit your doctor for checks on your progress. This drug  may make you feel generally unwell. This is not uncommon, as chemotherapy can affect healthy cells as well as cancer cells. Report any side effects. Continue your course of treatment even though you feel ill unless your doctor tells you to stop. In some cases, you may be given additional medicines to help with side effects. Follow all directions for their use. Call your doctor or health care professional for advice if you get a fever, chills or sore throat, or other symptoms of a cold or flu. Do not treat yourself. This drug decreases your body's ability to fight infections. Try to avoid being around people who are sick. This medicine may increase your risk to bruise or bleed. Call your doctor or health care professional if you notice any unusual bleeding. Do not become pregnant while taking this medicine or for at least 1 month after stopping it. Women should inform their doctor if they wish to become pregnant or think they might be pregnant. Men should not father a child while taking this medicine and for at least 2 months after stopping it. There is a potential for serious side effects to an unborn child. Talk to your health care professional or pharmacist for more information. Do not breast-feed an infant while taking this medicine. What side effects may I notice from receiving this medicine? Side effects that you should report to your doctor or health care professional as soon as possible: -low blood counts - this medicine may decrease the number of white blood cells, red blood cells and platelets. You may be at increased risk for infections and bleeding. -signs of infection - fever or chills, cough, sore throat,  pain or difficulty passing urine -signs of decreased platelets or bleeding - bruising, pinpoint red spots on the skin, black, tarry stools, blood in the urine -signs of decreased red blood cells - unusual weakness or tiredness, fainting spells, lightheadedness -increased blood sugar Side  effects that usually do not require medical attention (report to your prescriber or health care professional if they continue or are bothersome): -constipation -diarrhea -headache -loss of appetite -nausea, vomiting -skin rash, itching -stomach pain -water retention -weak or tired This list may not describe all possible side effects. Call your doctor for medical advice about side effects. You may report side effects to FDA at 1-800-FDA-1088. Where should I keep my medicine? This drug is given in a hospital or clinic and will not be stored at home. NOTE: This sheet is a summary. It may not cover all possible information. If you have questions about this medicine, talk to your doctor, pharmacist, or health care provider.    2016, [employer]vier/[employer] Standard. (2014-12-14 12:56:02)

## 2016-02-21 ENCOUNTER — Ambulatory Visit (HOSPITAL_BASED_OUTPATIENT_CLINIC_OR_DEPARTMENT_OTHER): Payer: Medicare Other

## 2016-02-21 VITALS — BP 117/46 | HR 78 | Temp 98.0°F | Resp 18

## 2016-02-21 DIAGNOSIS — C9300 Acute monoblastic/monocytic leukemia, not having achieved remission: Secondary | ICD-10-CM | POA: Diagnosis not present

## 2016-02-21 DIAGNOSIS — Z5111 Encounter for antineoplastic chemotherapy: Secondary | ICD-10-CM

## 2016-02-21 MED ORDER — PROCHLORPERAZINE MALEATE 10 MG PO TABS
ORAL_TABLET | ORAL | Status: AC
  Filled 2016-02-21: qty 1

## 2016-02-21 MED ORDER — HEPARIN SOD (PORK) LOCK FLUSH 100 UNIT/ML IV SOLN
500.0000 [IU] | Freq: Once | INTRAVENOUS | Status: AC | PRN
  Administered 2016-02-21: 500 [IU]
  Filled 2016-02-21: qty 5

## 2016-02-21 MED ORDER — SODIUM CHLORIDE 0.9 % IV SOLN
16.0000 mg/m2 | Freq: Once | INTRAVENOUS | Status: AC
  Administered 2016-02-21: 25 mg via INTRAVENOUS
  Filled 2016-02-21: qty 5

## 2016-02-21 MED ORDER — SODIUM CHLORIDE 0.9% FLUSH
10.0000 mL | INTRAVENOUS | Status: DC | PRN
  Administered 2016-02-21: 10 mL
  Filled 2016-02-21: qty 10

## 2016-02-21 MED ORDER — SODIUM CHLORIDE 0.9 % IV SOLN
Freq: Once | INTRAVENOUS | Status: AC
  Administered 2016-02-21: 11:00:00 via INTRAVENOUS

## 2016-02-21 MED ORDER — PROCHLORPERAZINE MALEATE 10 MG PO TABS
10.0000 mg | ORAL_TABLET | Freq: Once | ORAL | Status: AC
  Administered 2016-02-21: 10 mg via ORAL

## 2016-02-21 NOTE — Patient Instructions (Signed)
[facility] Discharge Instructions for Patients Receiving Chemotherapy  Today you received the following chemotherapy agents Decitabine  To help prevent nausea and vomiting after your treatment, we encourage you to take your nausea medication    If you develop nausea and vomiting that is not controlled by your nausea medication, call the clinic.   BELOW ARE SYMPTOMS THAT SHOULD BE REPORTED IMMEDIATELY:  *FEVER GREATER THAN 100.5 F  *CHILLS WITH OR WITHOUT FEVER  NAUSEA AND VOMITING THAT IS NOT CONTROLLED WITH YOUR NAUSEA MEDICATION  *UNUSUAL SHORTNESS OF BREATH  *UNUSUAL BRUISING OR BLEEDING  TENDERNESS IN MOUTH AND THROAT WITH OR WITHOUT PRESENCE OF ULCERS  *URINARY PROBLEMS  *BOWEL PROBLEMS  UNUSUAL RASH Items with * indicate a potential emergency and should be followed up as soon as possible.  Feel free to call the clinic you have any questions or concerns. The clinic phone number is [phone].  Please show the CHEMO ALERT CARD at check-in to the Emergency Department and triage nurse.   

## 2016-02-22 ENCOUNTER — Other Ambulatory Visit: Payer: Medicare Other

## 2016-02-27 ENCOUNTER — Other Ambulatory Visit (HOSPITAL_BASED_OUTPATIENT_CLINIC_OR_DEPARTMENT_OTHER): Payer: Medicare Other

## 2016-02-27 ENCOUNTER — Ambulatory Visit (HOSPITAL_BASED_OUTPATIENT_CLINIC_OR_DEPARTMENT_OTHER): Payer: Medicare Other

## 2016-02-27 ENCOUNTER — Ambulatory Visit (HOSPITAL_BASED_OUTPATIENT_CLINIC_OR_DEPARTMENT_OTHER): Payer: Medicare Other | Admitting: Family

## 2016-02-27 ENCOUNTER — Encounter: Payer: Self-pay | Admitting: Family

## 2016-02-27 VITALS — BP 128/54 | HR 91 | Temp 97.8°F | Resp 16 | Ht 62.5 in | Wt 137.0 lb

## 2016-02-27 DIAGNOSIS — C9300 Acute monoblastic/monocytic leukemia, not having achieved remission: Secondary | ICD-10-CM | POA: Diagnosis not present

## 2016-02-27 DIAGNOSIS — Z23 Encounter for immunization: Secondary | ICD-10-CM

## 2016-02-27 LAB — TECHNOLOGIST REVIEW CHCC SATELLITE

## 2016-02-27 LAB — CMP (CANCER CENTER ONLY)
ALBUMIN: 4 g/dL (ref 3.3–5.5)
ALK PHOS: 70 U/L (ref 26–84)
ALT: 31 U/L (ref 10–47)
AST: 42 U/L — ABNORMAL HIGH (ref 11–38)
BUN: 16 mg/dL (ref 7–22)
CALCIUM: 9 mg/dL (ref 8.0–10.3)
CO2: 32 mEq/L (ref 18–33)
Chloride: 100 mEq/L (ref 98–108)
Creat: 0.9 mg/dl (ref 0.6–1.2)
Glucose, Bld: 83 mg/dL (ref 73–118)
POTASSIUM: 3.6 meq/L (ref 3.3–4.7)
Sodium: 133 mEq/L (ref 128–145)
TOTAL PROTEIN: 6.5 g/dL (ref 6.4–8.1)
Total Bilirubin: 1 mg/dl (ref 0.20–1.60)

## 2016-02-27 LAB — CBC WITH DIFFERENTIAL (CANCER CENTER ONLY)
BASO#: 0.2 10*3/uL (ref 0.0–0.2)
BASO%: 5.9 % — AB (ref 0.0–2.0)
EOS%: 1.5 % (ref 0.0–7.0)
Eosinophils Absolute: 0.1 10*3/uL (ref 0.0–0.5)
HEMATOCRIT: 26.2 % — AB (ref 34.8–46.6)
HEMOGLOBIN: 8.9 g/dL — AB (ref 11.6–15.9)
LYMPH#: 2 10*3/uL (ref 0.9–3.3)
LYMPH%: 48.5 % — ABNORMAL HIGH (ref 14.0–48.0)
MCH: 34.8 pg — ABNORMAL HIGH (ref 26.0–34.0)
MCHC: 34 g/dL (ref 32.0–36.0)
MCV: 102 fL — ABNORMAL HIGH (ref 81–101)
MONO#: 0.1 10*3/uL (ref 0.1–0.9)
MONO%: 3.5 % (ref 0.0–13.0)
NEUT#: 1.6 10*3/uL (ref 1.5–6.5)
NEUT%: 40.6 % (ref 39.6–80.0)
Platelets: 68 10*3/uL — ABNORMAL LOW (ref 145–400)
RBC: 2.56 10*6/uL — ABNORMAL LOW (ref 3.70–5.32)
RDW: 17.8 % — AB (ref 11.1–15.7)
WBC: 4 10*3/uL (ref 3.9–10.0)

## 2016-02-27 MED ORDER — SODIUM CHLORIDE 0.9% FLUSH
10.0000 mL | INTRAVENOUS | Status: DC | PRN
  Administered 2016-02-27: 10 mL via INTRAVENOUS
  Filled 2016-02-27: qty 10

## 2016-02-27 MED ORDER — HEPARIN SOD (PORK) LOCK FLUSH 100 UNIT/ML IV SOLN
500.0000 [IU] | Freq: Once | INTRAVENOUS | Status: AC
  Administered 2016-02-27: 500 [IU] via INTRAVENOUS
  Filled 2016-02-27: qty 5

## 2016-02-27 MED ORDER — INFLUENZA VAC SPLIT QUAD 0.5 ML IM SUSY
0.5000 mL | PREFILLED_SYRINGE | Freq: Once | INTRAMUSCULAR | Status: AC
  Administered 2016-02-27: 0.5 mL via INTRAMUSCULAR
  Filled 2016-02-27: qty 0.5

## 2016-02-27 NOTE — Patient Instructions (Signed)

## 2016-02-27 NOTE — Progress Notes (Signed)
Hematology and Oncology Follow Up Visit  Kristina Meyer [id number] 1940/09/16 75 y.o. 02/27/2016   Principle Diagnosis:  Acute monocytic leukemia - normal cytogenetics  Current Therapy:   Status post cycle 2 of Dacogen - 7 day cycle    Interim History:  Kristina Meyer is here today with her husband for follow-up. She has completed cycle 2 of Dacogen and has tolerated treatment nicely. She denies fatigue. No fever, chills, n/v, cough, rash, dizziness, SOB, chest pain, palpitations, abdominal pain or changes in bowel or bladder habits.  This last weekend was quite hard on her emotionally. He brother passed away from a long illness. She is thankful for her support system at home helping her get through this hard time.  She has had some hoarseness with her voice off and on. She states that this started.  She has maintained a healthy appetite and has stayed well hydrated. She has had no trouble swallowing. Her weight is stable.  Platelet count today is 68. She has had no episodes of bleeding or bruising. Hgb is 8.9.  No lymphadenopathy found on exam.  No swelling, tenderness, numbness or tingling in her extremities. No c/o aches or pain.   Medications:    Medication List       Accurate as of 02/27/16  2:31 PM. Always use your most recent med list.          ALPRAZolam 0.5 MG tablet Commonly known as:  XANAX Take 1 tablet (0.5 mg total) by mouth 3 (three) times daily as needed for anxiety.   aspirin 81 MG tablet Take by mouth.   atorvastatin 20 MG tablet Commonly known as:  LIPITOR Take 10 mg by mouth at bedtime.   CALCIUM 1200+D3 PO Take 1 tablet by mouth daily.   cholecalciferol 1000 units tablet Commonly known as:  VITAMIN D Take 1,000 Units by mouth daily.   hydrochlorothiazide 25 MG tablet Commonly known as:  HYDRODIURIL Take by mouth. Start taking on:  12/08/2016   lidocaine-prilocaine cream Commonly known as:  EMLA Apply 1 application topically as needed. Place quarter  sized amount of cream directly on portacath and cover with saran wrap at least 1 - 1 1/2 hours prior to procedure.   MULTIVITAMIN ADULT Tabs Take 1 tablet by mouth daily.   multivitamin Liqd Take 5 mLs by mouth daily.   orphenadrine 100 MG tablet Commonly known as:  NORFLEX Take 1 tablet (100 mg total) by mouth at bedtime as needed for muscle spasms.   PARoxetine 20 MG tablet Commonly known as:  PAXIL Take 20 mg by mouth daily.   potassium chloride SA 20 MEQ tablet Commonly known as:  K-DUR,KLOR-CON Take 1 pill twice a day for 7 days, then 1 pill a day.   prochlorperazine 5 MG tablet Commonly known as:  COMPAZINE Take 1 tablet (5 mg total) by mouth every 6 (six) hours as needed for nausea or vomiting.   sulfamethoxazole-trimethoprim 800-160 MG per tablet Commonly known as:  BACTRIM DS Take 1 tablet by mouth daily.       Allergies:  Allergies  Allergen Reactions  . Other Other (See Comments)    GENERAL Anesthesia, vomiting    Past Medical History, Surgical history, Social history, and Family History were reviewed and updated.  Review of Systems: All other 10 point review of systems is negative.   Physical Exam:  vitals were not taken for this visit.  Wt Readings from Last 3 Encounters:  02/13/16 136 lb (61.7 kg)  01/25/16  140 lb 1.3 oz (63.5 kg)  01/11/16 137 lb (62.1 kg)    Ocular: Sclerae unicteric, pupils equal, round and reactive to light Ear-nose-throat: Oropharynx clear, dentition fair Lymphatic: No cervical axillary or supraclavicular adenopathy Lungs no rales or rhonchi, good excursion bilaterally Heart regular rate and rhythm, no murmur appreciated Abd soft, nontender, positive bowel sounds, no liver or spleen tip palpated on exam MSK no focal spinal tenderness, no joint edema Neuro: non-focal, well-oriented, appropriate affect Breasts: Deferred  Lab Results  Component Value Date   WBC 4.9 02/20/2016   HGB 10.0 (L) 02/20/2016   HCT 29.0 (L)  02/20/2016   MCV 101 02/20/2016   PLT 99 (L) 02/20/2016   No results found for: FERRITIN, IRON, TIBC, UIBC, IRONPCTSAT Lab Results  Component Value Date   RBC 2.86 (L) 02/20/2016   No results found for: KPAFRELGTCHN, LAMBDASER, KAPLAMBRATIO No results found for: IGGSERUM, IGA, IGMSERUM No results found for: Odetta Pink, SPEI   Chemistry      Component Value Date/Time   NA 132 02/20/2016 1152   NA 139 02/08/2016 1009   K 3.8 02/20/2016 1152   K 4.1 02/08/2016 1009   CL 98 02/20/2016 1152   CO2 27 02/20/2016 1152   CO2 24 02/08/2016 1009   BUN 17 02/20/2016 1152   BUN 11.3 02/08/2016 1009   CREATININE 1.0 02/20/2016 1152   CREATININE 0.8 02/08/2016 1009      Component Value Date/Time   CALCIUM 9.0 02/20/2016 1152   CALCIUM 9.2 02/08/2016 1009   ALKPHOS 66 02/20/2016 1152   ALKPHOS 67 02/08/2016 1009   AST 38 02/20/2016 1152   AST 34 02/08/2016 1009   ALT 33 02/20/2016 1152   ALT 22 02/08/2016 1009   BILITOT 0.90 02/20/2016 1152   BILITOT 0.60 02/08/2016 1009     Impression and Plan: Kristina Meyer is 75 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 Cytogenetics are normal. So far she has tolerated treatment with Dacogen quite well. She is asymptomatic at this time.  CMP today looks good. Hgb is 8.9 with a platelet count of 68.  She would like a flu shot today.  She has an appointment for a teeth cleaning and xrays next month and will let us know the exact date this week so we can make sure she has a prophylactic antibiotic to take before hand We will see her back in 2 weeks for repeat lab work, follow-up and cycle 3 of treatment.  She will contact us with any questions or concerns. We can certainly see her sooner if need be.   Eliezer Bottom, NP 10/2/20172:31 PM

## 2016-02-29 ENCOUNTER — Other Ambulatory Visit: Payer: Medicare Other

## 2016-03-07 ENCOUNTER — Other Ambulatory Visit: Payer: Medicare Other

## 2016-03-12 ENCOUNTER — Encounter: Payer: Self-pay | Admitting: Hematology & Oncology

## 2016-03-12 ENCOUNTER — Ambulatory Visit (HOSPITAL_BASED_OUTPATIENT_CLINIC_OR_DEPARTMENT_OTHER): Payer: Medicare Other

## 2016-03-12 ENCOUNTER — Ambulatory Visit (HOSPITAL_BASED_OUTPATIENT_CLINIC_OR_DEPARTMENT_OTHER): Payer: Medicare Other | Admitting: Hematology & Oncology

## 2016-03-12 ENCOUNTER — Other Ambulatory Visit (HOSPITAL_BASED_OUTPATIENT_CLINIC_OR_DEPARTMENT_OTHER): Payer: Medicare Other

## 2016-03-12 VITALS — BP 132/55 | HR 95 | Temp 97.8°F | Resp 18 | Ht 62.25 in | Wt 136.0 lb

## 2016-03-12 DIAGNOSIS — C9300 Acute monoblastic/monocytic leukemia, not having achieved remission: Secondary | ICD-10-CM

## 2016-03-12 DIAGNOSIS — C9210 Chronic myeloid leukemia, BCR/ABL-positive, not having achieved remission: Secondary | ICD-10-CM

## 2016-03-12 DIAGNOSIS — Z5111 Encounter for antineoplastic chemotherapy: Secondary | ICD-10-CM | POA: Diagnosis not present

## 2016-03-12 DIAGNOSIS — D72829 Elevated white blood cell count, unspecified: Secondary | ICD-10-CM

## 2016-03-12 LAB — CBC WITH DIFFERENTIAL (CANCER CENTER ONLY)
BASO#: 0.1 10*3/uL (ref 0.0–0.2)
BASO%: 3.8 % — AB (ref 0.0–2.0)
EOS%: 1.5 % (ref 0.0–7.0)
Eosinophils Absolute: 0.1 10*3/uL (ref 0.0–0.5)
HEMATOCRIT: 24.4 % — AB (ref 34.8–46.6)
HEMOGLOBIN: 8.2 g/dL — AB (ref 11.6–15.9)
LYMPH#: 1.8 10*3/uL (ref 0.9–3.3)
LYMPH%: 52.3 % — ABNORMAL HIGH (ref 14.0–48.0)
MCH: 35.7 pg — ABNORMAL HIGH (ref 26.0–34.0)
MCHC: 33.6 g/dL (ref 32.0–36.0)
MCV: 106 fL — ABNORMAL HIGH (ref 81–101)
MONO#: 0.1 10*3/uL (ref 0.1–0.9)
MONO%: 1.7 % (ref 0.0–13.0)
NEUT%: 40.7 % (ref 39.6–80.0)
NEUTROS ABS: 1.4 10*3/uL — AB (ref 1.5–6.5)
Platelets: 220 10*3/uL (ref 145–400)
RBC: 2.3 10*6/uL — ABNORMAL LOW (ref 3.70–5.32)
RDW: 19.7 % — AB (ref 11.1–15.7)
WBC: 3.4 10*3/uL — ABNORMAL LOW (ref 3.9–10.0)

## 2016-03-12 LAB — CMP (CANCER CENTER ONLY)
ALK PHOS: 68 U/L (ref 26–84)
ALT: 34 U/L (ref 10–47)
AST: 48 U/L — AB (ref 11–38)
Albumin: 3.8 g/dL (ref 3.3–5.5)
BILIRUBIN TOTAL: 0.9 mg/dL (ref 0.20–1.60)
BUN, Bld: 13 mg/dL (ref 7–22)
CALCIUM: 9.3 mg/dL (ref 8.0–10.3)
CO2: 34 mEq/L — ABNORMAL HIGH (ref 18–33)
Chloride: 96 mEq/L — ABNORMAL LOW (ref 98–108)
Creat: 0.8 mg/dl (ref 0.6–1.2)
GLUCOSE: 101 mg/dL (ref 73–118)
Potassium: 3.5 mEq/L (ref 3.3–4.7)
Sodium: 133 mEq/L (ref 128–145)
Total Protein: 6.3 g/dL — ABNORMAL LOW (ref 6.4–8.1)

## 2016-03-12 LAB — TECHNOLOGIST REVIEW CHCC SATELLITE

## 2016-03-12 LAB — CHCC SATELLITE - SMEAR

## 2016-03-12 MED ORDER — PROCHLORPERAZINE MALEATE 10 MG PO TABS
10.0000 mg | ORAL_TABLET | Freq: Once | ORAL | Status: AC
  Administered 2016-03-12: 10 mg via ORAL

## 2016-03-12 MED ORDER — HEPARIN SOD (PORK) LOCK FLUSH 100 UNIT/ML IV SOLN
500.0000 [IU] | Freq: Once | INTRAVENOUS | Status: AC | PRN
  Administered 2016-03-12: 500 [IU]
  Filled 2016-03-12: qty 5

## 2016-03-12 MED ORDER — SODIUM CHLORIDE 0.9 % IV SOLN
Freq: Once | INTRAVENOUS | Status: AC
  Administered 2016-03-12: 12:00:00 via INTRAVENOUS

## 2016-03-12 MED ORDER — SODIUM CHLORIDE 0.9 % IV SOLN
16.0000 mg/m2 | Freq: Once | INTRAVENOUS | Status: AC
  Administered 2016-03-12: 25 mg via INTRAVENOUS
  Filled 2016-03-12: qty 5

## 2016-03-12 MED ORDER — SODIUM CHLORIDE 0.9% FLUSH
10.0000 mL | INTRAVENOUS | Status: DC | PRN
  Administered 2016-03-12: 10 mL
  Filled 2016-03-12: qty 10

## 2016-03-12 MED ORDER — PROCHLORPERAZINE MALEATE 10 MG PO TABS
ORAL_TABLET | ORAL | Status: AC
  Filled 2016-03-12: qty 1

## 2016-03-12 NOTE — Progress Notes (Signed)
Hematology and Oncology Follow Up Visit  Kristina Meyer [id number] 1941-03-02 75 y.o. 03/12/2016   Principle Diagnosis:   Acute monocytic leukemia - normal cytogenetics  Current Therapy:    Status post cycle #2 of Dacogen-7 day cycle     Interim History:  Kristina Meyer is  back for follow-up. She really looks good. She has tolerated her first 2 cycles of Dacogen very very nicely. She has shown a very tough resilience.  She had no nausea or vomiting with the Dacogen. There is no bleeding. She's had no fever.  We have not had to transfuse her. Her platelet count has been coming up. Everything has looked encouraging with respect to a response. I have to think that she is responding.  We try to get NGS on her peripheral blood. We cannot because there were no circulating leukemia cells. Again, I have to believe this is very encouraging.  Her appetite is good. She has had no nausea or vomiting. She has had no diarrhea. There's been no constipation.  She's not noted any leg swelling. She's had no headache. She's had no mouth sores.  Overall, her performance status has to be ECOG 1.  Medications:  Current Outpatient Prescriptions:  .  ALPRAZolam (XANAX) 0.5 MG tablet, Take 1 tablet (0.5 mg total) by mouth 3 (three) times daily as needed for anxiety., Disp: 60 tablet, Rfl: 2 .  aspirin 81 MG tablet, Take by mouth., Disp: , Rfl:  .  atorvastatin (LIPITOR) 20 MG tablet, Take 10 mg by mouth at bedtime., Disp: , Rfl:  .  Calcium-Magnesium-Vitamin D (CALCIUM 1200+D3 PO), Take 1 tablet by mouth daily., Disp: , Rfl:  .  cholecalciferol (VITAMIN D) 1000 UNITS tablet, Take 1,000 Units by mouth daily., Disp: , Rfl:  .  [START ON 12/08/2016] hydrochlorothiazide (HYDRODIURIL) 25 MG tablet, Take by mouth., Disp: , Rfl:  .  lidocaine-prilocaine (EMLA) cream, Apply 1 application topically as needed. Place quarter sized amount of cream directly on portacath and cover with saran wrap at least 1 - 1 1/2 hours prior  to procedure., Disp: 30 g, Rfl: 3 .  Multiple Vitamin (MULTIVITAMIN) LIQD, Take 5 mLs by mouth daily., Disp: , Rfl:  .  Multiple Vitamins-Minerals (MULTIVITAMIN ADULT) TABS, Take 1 tablet by mouth daily., Disp: , Rfl:  .  orphenadrine (NORFLEX) 100 MG tablet, Take 1 tablet (100 mg total) by mouth at bedtime as needed for muscle spasms., Disp: 30 tablet, Rfl: 2 .  PARoxetine (PAXIL) 20 MG tablet, Take 20 mg by mouth daily., Disp: , Rfl:  .  potassium chloride SA (K-DUR,KLOR-CON) 20 MEQ tablet, Take 1 pill twice a day for 7 days, then 1 pill a day., Disp: 60 tablet, Rfl: 3 .  prochlorperazine (COMPAZINE) 5 MG tablet, Take 1 tablet (5 mg total) by mouth every 6 (six) hours as needed for nausea or vomiting., Disp: 60 tablet, Rfl: 0 .  sulfamethoxazole-trimethoprim (BACTRIM DS) 800-160 MG per tablet, Take 1 tablet by mouth daily., Disp: , Rfl:   Allergies:  Allergies  Allergen Reactions  . Other Other (See Comments)    GENERAL Anesthesia, vomiting    Past Medical History, Surgical history, Social history, and Family History were reviewed and updated.  Review of Systems:  As above  Physical Exam:  height is 5' 2.25" (1.581 m) and weight is 136 lb (61.7 kg). Her oral temperature is 97.8 F (36.6 C). Her blood pressure is 132/55 (abnormal) and her pulse is 95. Her respiration is 18.  Wt Readings from Last 3 Encounters:  03/12/16 136 lb (61.7 kg)  02/27/16 137 lb (62.1 kg)  02/13/16 136 lb (61.7 kg)      Well-developed and well-nourished white female in no obvious distress. Head and neck exam shows no ocular or oral lesions. There are no palpable cervical or supra clavicular lymph nodes. Lungs are clear bilaterally. Cardiac exam regular rate and rhythm with no murmurs, rubs or bruits. Abdomen is soft. She has good bowel sounds. There is no fluid wave. There is no palpable liver or spleen tip. Back exam shows no tenderness over the spine, ribs or hips. Externa shows no clubbing, cyanosis or  edema. She has good range of motion of her joints. Neurological exam shows no focal neurological deficits.  Lab Results  Component Value Date   WBC 3.4 (L) 03/12/2016   HGB 8.2 (L) 03/12/2016   HCT 24.4 (L) 03/12/2016   MCV 106 (H) 03/12/2016   PLT 220 03/12/2016     Chemistry      Component Value Date/Time   NA 133 03/12/2016 1047   NA 139 02/08/2016 1009   K 3.5 03/12/2016 1047   K 4.1 02/08/2016 1009   CL 96 (L) 03/12/2016 1047   CO2 34 (H) 03/12/2016 1047   CO2 24 02/08/2016 1009   BUN 13 03/12/2016 1047   BUN 11.3 02/08/2016 1009   CREATININE 0.8 03/12/2016 1047   CREATININE 0.8 02/08/2016 1009      Component Value Date/Time   CALCIUM 9.3 03/12/2016 1047   CALCIUM 9.2 02/08/2016 1009   ALKPHOS 68 03/12/2016 1047   ALKPHOS 67 02/08/2016 1009   AST 48 (H) 03/12/2016 1047   AST 34 02/08/2016 1009   ALT 34 03/12/2016 1047   ALT 22 02/08/2016 1009   BILITOT 0.90 03/12/2016 1047   BILITOT 0.60 02/08/2016 1009         Impression and Plan: Kristina Meyer is  A 75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 Her cytogenetics are normal which I would think would be a good prognostic sign.   We will go ahead with her third cycle of treatment. After her fourth cycle, we will then plan for a bone marrow test.  By her platelet count, I would have to think that her response to treatment is a encouraging 1.   Her hemoglobin is on the low side. We will have to watch out for this. I will check her CBC later on this week. We may have to transfuse her again.   We will plan to get her back in 2 weeks for follow-up.   I don't think we have to do another bone marrow test on her probably until after the fourth cycle.      Volanda Napoleon, MD 10/16/201711:54 AM

## 2016-03-12 NOTE — Progress Notes (Signed)
Ok to treat with AST 48 and neut 1.4 per Dr. Marin Olp orders.

## 2016-03-12 NOTE — Patient Instructions (Signed)
[facility] Discharge Instructions for Patients Receiving Chemotherapy  Today you received the following chemotherapy agents Decitabine  To help prevent nausea and vomiting after your treatment, we encourage you to take your nausea medication    If you develop nausea and vomiting that is not controlled by your nausea medication, call the clinic.   BELOW ARE SYMPTOMS THAT SHOULD BE REPORTED IMMEDIATELY:  *FEVER GREATER THAN 100.5 F  *CHILLS WITH OR WITHOUT FEVER  NAUSEA AND VOMITING THAT IS NOT CONTROLLED WITH YOUR NAUSEA MEDICATION  *UNUSUAL SHORTNESS OF BREATH  *UNUSUAL BRUISING OR BLEEDING  TENDERNESS IN MOUTH AND THROAT WITH OR WITHOUT PRESENCE OF ULCERS  *URINARY PROBLEMS  *BOWEL PROBLEMS  UNUSUAL RASH Items with * indicate a potential emergency and should be followed up as soon as possible.  Feel free to call the clinic you have any questions or concerns. The clinic phone number is [phone].  Please show the CHEMO ALERT CARD at check-in to the Emergency Department and triage nurse.   

## 2016-03-12 NOTE — Addendum Note (Signed)
Addended by: Burney Gauze R on: 03/12/2016 12:01 PM   Modules accepted: Orders

## 2016-03-13 ENCOUNTER — Other Ambulatory Visit: Payer: Medicare Other

## 2016-03-13 ENCOUNTER — Ambulatory Visit (HOSPITAL_BASED_OUTPATIENT_CLINIC_OR_DEPARTMENT_OTHER): Payer: Medicare Other

## 2016-03-13 VITALS — BP 114/50 | HR 84 | Temp 97.4°F | Resp 16

## 2016-03-13 DIAGNOSIS — C9300 Acute monoblastic/monocytic leukemia, not having achieved remission: Secondary | ICD-10-CM

## 2016-03-13 DIAGNOSIS — Z5111 Encounter for antineoplastic chemotherapy: Secondary | ICD-10-CM | POA: Diagnosis not present

## 2016-03-13 MED ORDER — PROCHLORPERAZINE MALEATE 10 MG PO TABS
ORAL_TABLET | ORAL | Status: AC
  Filled 2016-03-13: qty 1

## 2016-03-13 MED ORDER — SODIUM CHLORIDE 0.9% FLUSH
10.0000 mL | INTRAVENOUS | Status: DC | PRN
  Administered 2016-03-13: 10 mL
  Filled 2016-03-13: qty 10

## 2016-03-13 MED ORDER — HEPARIN SOD (PORK) LOCK FLUSH 100 UNIT/ML IV SOLN
500.0000 [IU] | Freq: Once | INTRAVENOUS | Status: AC | PRN
  Administered 2016-03-13: 500 [IU]
  Filled 2016-03-13: qty 5

## 2016-03-13 MED ORDER — SODIUM CHLORIDE 0.9 % IV SOLN
Freq: Once | INTRAVENOUS | Status: AC
  Administered 2016-03-13: 11:00:00 via INTRAVENOUS

## 2016-03-13 MED ORDER — SODIUM CHLORIDE 0.9 % IV SOLN
16.0000 mg/m2 | Freq: Once | INTRAVENOUS | Status: AC
  Administered 2016-03-13: 25 mg via INTRAVENOUS
  Filled 2016-03-13: qty 5

## 2016-03-13 MED ORDER — PROCHLORPERAZINE MALEATE 10 MG PO TABS
10.0000 mg | ORAL_TABLET | Freq: Once | ORAL | Status: AC
  Administered 2016-03-13: 10 mg via ORAL

## 2016-03-13 NOTE — Patient Instructions (Signed)
[facility] Discharge Instructions for Patients Receiving Chemotherapy  Today you received the following chemotherapy agents Decitabine  To help prevent nausea and vomiting after your treatment, we encourage you to take your nausea medication    If you develop nausea and vomiting that is not controlled by your nausea medication, call the clinic.   BELOW ARE SYMPTOMS THAT SHOULD BE REPORTED IMMEDIATELY:  *FEVER GREATER THAN 100.5 F  *CHILLS WITH OR WITHOUT FEVER  NAUSEA AND VOMITING THAT IS NOT CONTROLLED WITH YOUR NAUSEA MEDICATION  *UNUSUAL SHORTNESS OF BREATH  *UNUSUAL BRUISING OR BLEEDING  TENDERNESS IN MOUTH AND THROAT WITH OR WITHOUT PRESENCE OF ULCERS  *URINARY PROBLEMS  *BOWEL PROBLEMS  UNUSUAL RASH Items with * indicate a potential emergency and should be followed up as soon as possible.  Feel free to call the clinic you have any questions or concerns. The clinic phone number is [phone].  Please show the CHEMO ALERT CARD at check-in to the Emergency Department and triage nurse.   

## 2016-03-14 ENCOUNTER — Ambulatory Visit (HOSPITAL_BASED_OUTPATIENT_CLINIC_OR_DEPARTMENT_OTHER): Payer: Medicare Other

## 2016-03-14 VITALS — BP 119/55 | HR 79 | Temp 97.7°F | Resp 16

## 2016-03-14 DIAGNOSIS — C9300 Acute monoblastic/monocytic leukemia, not having achieved remission: Secondary | ICD-10-CM

## 2016-03-14 DIAGNOSIS — Z5111 Encounter for antineoplastic chemotherapy: Secondary | ICD-10-CM

## 2016-03-14 MED ORDER — HEPARIN SOD (PORK) LOCK FLUSH 100 UNIT/ML IV SOLN
500.0000 [IU] | Freq: Once | INTRAVENOUS | Status: AC | PRN
  Administered 2016-03-14: 500 [IU]
  Filled 2016-03-14: qty 5

## 2016-03-14 MED ORDER — SODIUM CHLORIDE 0.9 % IV SOLN
16.0000 mg/m2 | Freq: Once | INTRAVENOUS | Status: AC
  Administered 2016-03-14: 25 mg via INTRAVENOUS
  Filled 2016-03-14: qty 5

## 2016-03-14 MED ORDER — SODIUM CHLORIDE 0.9% FLUSH
10.0000 mL | INTRAVENOUS | Status: DC | PRN
  Administered 2016-03-14: 10 mL
  Filled 2016-03-14: qty 10

## 2016-03-14 MED ORDER — PROCHLORPERAZINE MALEATE 10 MG PO TABS
ORAL_TABLET | ORAL | Status: AC
  Filled 2016-03-14: qty 1

## 2016-03-14 MED ORDER — SODIUM CHLORIDE 0.9 % IV SOLN
Freq: Once | INTRAVENOUS | Status: AC
  Administered 2016-03-14: 12:00:00 via INTRAVENOUS

## 2016-03-14 MED ORDER — PROCHLORPERAZINE MALEATE 10 MG PO TABS
10.0000 mg | ORAL_TABLET | Freq: Once | ORAL | Status: AC
  Administered 2016-03-14: 10 mg via ORAL

## 2016-03-14 NOTE — Patient Instructions (Signed)
[facility] Discharge Instructions for Patients Receiving Chemotherapy  Today you received the following chemotherapy agents Decitabine  To help prevent nausea and vomiting after your treatment, we encourage you to take your nausea medication    If you develop nausea and vomiting that is not controlled by your nausea medication, call the clinic.   BELOW ARE SYMPTOMS THAT SHOULD BE REPORTED IMMEDIATELY:  *FEVER GREATER THAN 100.5 F  *CHILLS WITH OR WITHOUT FEVER  NAUSEA AND VOMITING THAT IS NOT CONTROLLED WITH YOUR NAUSEA MEDICATION  *UNUSUAL SHORTNESS OF BREATH  *UNUSUAL BRUISING OR BLEEDING  TENDERNESS IN MOUTH AND THROAT WITH OR WITHOUT PRESENCE OF ULCERS  *URINARY PROBLEMS  *BOWEL PROBLEMS  UNUSUAL RASH Items with * indicate a potential emergency and should be followed up as soon as possible.  Feel free to call the clinic you have any questions or concerns. The clinic phone number is [phone].  Please show the CHEMO ALERT CARD at check-in to the Emergency Department and triage nurse.   

## 2016-03-15 ENCOUNTER — Ambulatory Visit (HOSPITAL_BASED_OUTPATIENT_CLINIC_OR_DEPARTMENT_OTHER): Payer: Medicare Other

## 2016-03-15 VITALS — BP 117/49 | HR 75 | Temp 97.8°F | Resp 16

## 2016-03-15 DIAGNOSIS — C9300 Acute monoblastic/monocytic leukemia, not having achieved remission: Secondary | ICD-10-CM | POA: Diagnosis not present

## 2016-03-15 DIAGNOSIS — Z5111 Encounter for antineoplastic chemotherapy: Secondary | ICD-10-CM

## 2016-03-15 MED ORDER — SODIUM CHLORIDE 0.9% FLUSH
10.0000 mL | INTRAVENOUS | Status: DC | PRN
  Administered 2016-03-15: 10 mL
  Filled 2016-03-15: qty 10

## 2016-03-15 MED ORDER — SODIUM CHLORIDE 0.9 % IV SOLN
16.0000 mg/m2 | Freq: Once | INTRAVENOUS | Status: AC
  Administered 2016-03-15: 25 mg via INTRAVENOUS
  Filled 2016-03-15: qty 5

## 2016-03-15 MED ORDER — PROCHLORPERAZINE MALEATE 10 MG PO TABS
ORAL_TABLET | ORAL | Status: AC
  Filled 2016-03-15: qty 1

## 2016-03-15 MED ORDER — SODIUM CHLORIDE 0.9 % IV SOLN
Freq: Once | INTRAVENOUS | Status: AC
  Administered 2016-03-15: 11:00:00 via INTRAVENOUS

## 2016-03-15 MED ORDER — PROCHLORPERAZINE MALEATE 10 MG PO TABS
10.0000 mg | ORAL_TABLET | Freq: Once | ORAL | Status: AC
  Administered 2016-03-15: 10 mg via ORAL

## 2016-03-15 MED ORDER — HEPARIN SOD (PORK) LOCK FLUSH 100 UNIT/ML IV SOLN
500.0000 [IU] | Freq: Once | INTRAVENOUS | Status: AC | PRN
Start: 2016-03-15 — End: 2016-03-15
  Administered 2016-03-15: 500 [IU]
  Filled 2016-03-15: qty 5

## 2016-03-15 NOTE — Patient Instructions (Signed)
[facility] Discharge Instructions for Patients Receiving Chemotherapy  Today you received the following chemotherapy agents Decitabine  To help prevent nausea and vomiting after your treatment, we encourage you to take your nausea medication    If you develop nausea and vomiting that is not controlled by your nausea medication, call the clinic.   BELOW ARE SYMPTOMS THAT SHOULD BE REPORTED IMMEDIATELY:  *FEVER GREATER THAN 100.5 F  *CHILLS WITH OR WITHOUT FEVER  NAUSEA AND VOMITING THAT IS NOT CONTROLLED WITH YOUR NAUSEA MEDICATION  *UNUSUAL SHORTNESS OF BREATH  *UNUSUAL BRUISING OR BLEEDING  TENDERNESS IN MOUTH AND THROAT WITH OR WITHOUT PRESENCE OF ULCERS  *URINARY PROBLEMS  *BOWEL PROBLEMS  UNUSUAL RASH Items with * indicate a potential emergency and should be followed up as soon as possible.  Feel free to call the clinic you have any questions or concerns. The clinic phone number is [phone].  Please show the CHEMO ALERT CARD at check-in to the Emergency Department and triage nurse.   

## 2016-03-16 ENCOUNTER — Ambulatory Visit (HOSPITAL_BASED_OUTPATIENT_CLINIC_OR_DEPARTMENT_OTHER): Payer: Medicare Other

## 2016-03-16 VITALS — BP 133/49 | HR 88 | Temp 98.4°F | Resp 16

## 2016-03-16 DIAGNOSIS — C9300 Acute monoblastic/monocytic leukemia, not having achieved remission: Secondary | ICD-10-CM

## 2016-03-16 DIAGNOSIS — Z5111 Encounter for antineoplastic chemotherapy: Secondary | ICD-10-CM | POA: Diagnosis not present

## 2016-03-16 MED ORDER — HEPARIN SOD (PORK) LOCK FLUSH 100 UNIT/ML IV SOLN
500.0000 [IU] | Freq: Once | INTRAVENOUS | Status: AC | PRN
  Administered 2016-03-16: 500 [IU]
  Filled 2016-03-16: qty 5

## 2016-03-16 MED ORDER — PROCHLORPERAZINE MALEATE 10 MG PO TABS
10.0000 mg | ORAL_TABLET | Freq: Once | ORAL | Status: AC
  Administered 2016-03-16: 10 mg via ORAL

## 2016-03-16 MED ORDER — SODIUM CHLORIDE 0.9% FLUSH
10.0000 mL | INTRAVENOUS | Status: DC | PRN
  Administered 2016-03-16: 10 mL
  Filled 2016-03-16: qty 10

## 2016-03-16 MED ORDER — PROCHLORPERAZINE MALEATE 10 MG PO TABS
ORAL_TABLET | ORAL | Status: AC
  Filled 2016-03-16: qty 1

## 2016-03-16 MED ORDER — SODIUM CHLORIDE 0.9 % IV SOLN
16.0000 mg/m2 | Freq: Once | INTRAVENOUS | Status: AC
  Administered 2016-03-16: 25 mg via INTRAVENOUS
  Filled 2016-03-16: qty 5

## 2016-03-16 MED ORDER — SODIUM CHLORIDE 0.9 % IV SOLN
Freq: Once | INTRAVENOUS | Status: AC
  Administered 2016-03-16: 13:00:00 via INTRAVENOUS

## 2016-03-16 NOTE — Patient Instructions (Signed)
Decitabine injection for infusion What is this medicine? DECITABINE (dee SYE ta been) is a chemotherapy drug. This medicine reduces the growth of cancer cells. It is used to treat adults with myelodysplastic syndromes. This medicine may be used for other purposes; ask your health care provider or pharmacist if you have questions. What should I tell my health care provider before I take this medicine? They need to know if you have any of these conditions: -infection (especially a virus infection such as chickenpox, cold sores, or herpes) -kidney disease -liver disease -an unusual or allergic reaction to decitabine, other medicines, foods, dyes, or preservatives -pregnant or trying to get pregnant -breast-feeding How should I use this medicine? This medicine is for infusion into a vein. It is administered in a hospital or clinic by a doctor or health care professional. Talk to your pediatrician regarding the use of this medicine in children. Special care may be needed. Overdosage: If you think you have taken too much of this medicine contact a poison control center or emergency room at once. NOTE: This medicine is only for you. Do not share this medicine with others. What if I miss a dose? It is important not to miss your dose. Call your doctor or health care professional if you are unable to keep an appointment. What may interact with this medicine? -vaccines Talk to your doctor or health care professional before taking any of these medicines: -aspirin -acetaminophen -ibuprofen -ketoprofen -naproxen This list may not describe all possible interactions. Give your health care provider a list of all the medicines, herbs, non-prescription drugs, or dietary supplements you use. Also tell them if you smoke, drink alcohol, or use illegal drugs. Some items may interact with your medicine. What should I watch for while using this medicine? Visit your doctor for checks on your progress. This drug  may make you feel generally unwell. This is not uncommon, as chemotherapy can affect healthy cells as well as cancer cells. Report any side effects. Continue your course of treatment even though you feel ill unless your doctor tells you to stop. In some cases, you may be given additional medicines to help with side effects. Follow all directions for their use. Call your doctor or health care professional for advice if you get a fever, chills or sore throat, or other symptoms of a cold or flu. Do not treat yourself. This drug decreases your body's ability to fight infections. Try to avoid being around people who are sick. This medicine may increase your risk to bruise or bleed. Call your doctor or health care professional if you notice any unusual bleeding. Do not become pregnant while taking this medicine or for at least 1 month after stopping it. Women should inform their doctor if they wish to become pregnant or think they might be pregnant. Men should not father a child while taking this medicine and for at least 2 months after stopping it. There is a potential for serious side effects to an unborn child. Talk to your health care professional or pharmacist for more information. Do not breast-feed an infant while taking this medicine. What side effects may I notice from receiving this medicine? Side effects that you should report to your doctor or health care professional as soon as possible: -low blood counts - this medicine may decrease the number of white blood cells, red blood cells and platelets. You may be at increased risk for infections and bleeding. -signs of infection - fever or chills, cough, sore throat,  pain or difficulty passing urine -signs of decreased platelets or bleeding - bruising, pinpoint red spots on the skin, black, tarry stools, blood in the urine -signs of decreased red blood cells - unusual weakness or tiredness, fainting spells, lightheadedness -increased blood sugar Side  effects that usually do not require medical attention (report to your prescriber or health care professional if they continue or are bothersome): -constipation -diarrhea -headache -loss of appetite -nausea, vomiting -skin rash, itching -stomach pain -water retention -weak or tired This list may not describe all possible side effects. Call your doctor for medical advice about side effects. You may report side effects to FDA at 1-800-FDA-1088. Where should I keep my medicine? This drug is given in a hospital or clinic and will not be stored at home. NOTE: This sheet is a summary. It may not cover all possible information. If you have questions about this medicine, talk to your doctor, pharmacist, or health care provider.    2016, [employer]vier/[employer] Standard. (2014-12-14 12:56:02)

## 2016-03-19 ENCOUNTER — Other Ambulatory Visit (HOSPITAL_BASED_OUTPATIENT_CLINIC_OR_DEPARTMENT_OTHER): Payer: Medicare Other

## 2016-03-19 ENCOUNTER — Ambulatory Visit (HOSPITAL_BASED_OUTPATIENT_CLINIC_OR_DEPARTMENT_OTHER): Payer: Medicare Other

## 2016-03-19 ENCOUNTER — Other Ambulatory Visit: Payer: Self-pay | Admitting: Family

## 2016-03-19 ENCOUNTER — Ambulatory Visit (HOSPITAL_COMMUNITY)
Admission: RE | Admit: 2016-03-19 | Discharge: 2016-03-19 | Disposition: A | Discharge status: Home / Self Care | Payer: Medicare Other | Source: Ambulatory Visit | Attending: Hematology & Oncology | Admitting: Hematology & Oncology

## 2016-03-19 VITALS — BP 117/52 | HR 82 | Temp 97.8°F | Resp 16

## 2016-03-19 DIAGNOSIS — Z5111 Encounter for antineoplastic chemotherapy: Secondary | ICD-10-CM | POA: Diagnosis not present

## 2016-03-19 DIAGNOSIS — D6481 Anemia due to antineoplastic chemotherapy: Secondary | ICD-10-CM

## 2016-03-19 DIAGNOSIS — T451X5A Adverse effect of antineoplastic and immunosuppressive drugs, initial encounter: Secondary | ICD-10-CM | POA: Insufficient documentation

## 2016-03-19 DIAGNOSIS — C9300 Acute monoblastic/monocytic leukemia, not having achieved remission: Secondary | ICD-10-CM

## 2016-03-19 LAB — CMP (CANCER CENTER ONLY)
ALBUMIN: 3.7 g/dL (ref 3.3–5.5)
ALK PHOS: 100 U/L — AB (ref 26–84)
ALT: 36 U/L (ref 10–47)
AST: 43 U/L — AB (ref 11–38)
BILIRUBIN TOTAL: 1.1 mg/dL (ref 0.20–1.60)
BUN, Bld: 15 mg/dL (ref 7–22)
CALCIUM: 9.1 mg/dL (ref 8.0–10.3)
CO2: 28 mEq/L (ref 18–33)
CREATININE: 0.8 mg/dL (ref 0.6–1.2)
Chloride: 101 mEq/L (ref 98–108)
Glucose, Bld: 117 mg/dL (ref 73–118)
Potassium: 3.6 mEq/L (ref 3.3–4.7)
SODIUM: 136 meq/L (ref 128–145)
Total Protein: 6.2 g/dL — ABNORMAL LOW (ref 6.4–8.1)

## 2016-03-19 LAB — CBC WITH DIFFERENTIAL (CANCER CENTER ONLY)
HEMATOCRIT: 21.5 % — AB (ref 34.8–46.6)
HEMOGLOBIN: 7.3 g/dL — AB (ref 11.6–15.9)
MCH: 35.6 pg — ABNORMAL HIGH (ref 26.0–34.0)
MCHC: 34 g/dL (ref 32.0–36.0)
MCV: 105 fL — ABNORMAL HIGH (ref 81–101)
Platelets: 123 10*3/uL — ABNORMAL LOW (ref 145–400)
RBC: 2.05 10*6/uL — AB (ref 3.70–5.32)
RDW: 19 % — ABNORMAL HIGH (ref 11.1–15.7)
WBC: 1.9 10*3/uL — AB (ref 3.9–10.0)

## 2016-03-19 LAB — MANUAL DIFFERENTIAL (CHCC SATELLITE)
ALC: 1.1 10*3/uL (ref 0.6–2.2)
ANC (CHCC HP manual diff): 0.7 10*3/uL — ABNORMAL LOW (ref 1.5–6.7)
LYMPH: 60 % — ABNORMAL HIGH (ref 14–48)
MONO: 1 % (ref 0–13)
PLT EST ~~LOC~~: DECREASED
SEG: 39 % — AB (ref 40–75)

## 2016-03-19 MED ORDER — SODIUM CHLORIDE 0.9% FLUSH
10.0000 mL | INTRAVENOUS | Status: DC | PRN
  Administered 2016-03-19: 10 mL
  Filled 2016-03-19: qty 10

## 2016-03-19 MED ORDER — HEPARIN SOD (PORK) LOCK FLUSH 100 UNIT/ML IV SOLN
500.0000 [IU] | Freq: Once | INTRAVENOUS | Status: AC | PRN
  Administered 2016-03-19: 500 [IU]
  Filled 2016-03-19: qty 5

## 2016-03-19 MED ORDER — SODIUM CHLORIDE 0.9 % IV SOLN
Freq: Once | INTRAVENOUS | Status: AC
  Administered 2016-03-19: 12:00:00 via INTRAVENOUS

## 2016-03-19 MED ORDER — PROCHLORPERAZINE MALEATE 10 MG PO TABS
10.0000 mg | ORAL_TABLET | Freq: Once | ORAL | Status: AC
  Administered 2016-03-19: 10 mg via ORAL

## 2016-03-19 MED ORDER — SODIUM CHLORIDE 0.9 % IV SOLN
16.0000 mg/m2 | Freq: Once | INTRAVENOUS | Status: AC
  Administered 2016-03-19: 25 mg via INTRAVENOUS
  Filled 2016-03-19: qty 5

## 2016-03-19 MED ORDER — PROCHLORPERAZINE MALEATE 10 MG PO TABS
ORAL_TABLET | ORAL | Status: AC
  Filled 2016-03-19: qty 1

## 2016-03-19 NOTE — Patient Instructions (Signed)
[facility] Discharge Instructions for Patients Receiving Chemotherapy  Today you received the following chemotherapy agents Dacogen.  To help prevent nausea and vomiting after your treatment, we encourage you to take your nausea medication.   If you develop nausea and vomiting that is not controlled by your nausea medication, call the clinic.   BELOW ARE SYMPTOMS THAT SHOULD BE REPORTED IMMEDIATELY:  *FEVER GREATER THAN 100.5 F  *CHILLS WITH OR WITHOUT FEVER  NAUSEA AND VOMITING THAT IS NOT CONTROLLED WITH YOUR NAUSEA MEDICATION  *UNUSUAL SHORTNESS OF BREATH  *UNUSUAL BRUISING OR BLEEDING  TENDERNESS IN MOUTH AND THROAT WITH OR WITHOUT PRESENCE OF ULCERS  *URINARY PROBLEMS  *BOWEL PROBLEMS  UNUSUAL RASH Items with * indicate a potential emergency and should be followed up as soon as possible.  Feel free to call the clinic you have any questions or concerns. The clinic phone number is [phone].  Please show the CHEMO ALERT CARD at check-in to the Emergency Department and triage nurse.  

## 2016-03-19 NOTE — Progress Notes (Signed)
Will transfuse 1 units of blood tomorrow for Hgb of 7.3. Patient has intermittent fatigue with activity. She is otherwise asymptomatic at this time. We will recheck her lab work on 11/1. She knows to contact our office if she develops SOB, palpitations or fever. She will go to the ED if these symptoms occur after office hours.

## 2016-03-20 ENCOUNTER — Ambulatory Visit (HOSPITAL_BASED_OUTPATIENT_CLINIC_OR_DEPARTMENT_OTHER): Payer: Medicare Other

## 2016-03-20 VITALS — BP 121/48 | HR 75 | Temp 98.1°F | Resp 18

## 2016-03-20 DIAGNOSIS — Z5111 Encounter for antineoplastic chemotherapy: Secondary | ICD-10-CM

## 2016-03-20 DIAGNOSIS — C9300 Acute monoblastic/monocytic leukemia, not having achieved remission: Secondary | ICD-10-CM | POA: Diagnosis not present

## 2016-03-20 DIAGNOSIS — D6481 Anemia due to antineoplastic chemotherapy: Secondary | ICD-10-CM

## 2016-03-20 DIAGNOSIS — T451X5A Adverse effect of antineoplastic and immunosuppressive drugs, initial encounter: Secondary | ICD-10-CM

## 2016-03-20 LAB — PREPARE RBC (CROSSMATCH)

## 2016-03-20 MED ORDER — SODIUM CHLORIDE 0.9 % IV SOLN
Freq: Once | INTRAVENOUS | Status: AC
  Administered 2016-03-20: 09:00:00 via INTRAVENOUS

## 2016-03-20 MED ORDER — SODIUM CHLORIDE 0.9% FLUSH
10.0000 mL | INTRAVENOUS | Status: DC | PRN
Start: 2016-03-20 — End: 2016-03-20
  Filled 2016-03-20: qty 10

## 2016-03-20 MED ORDER — HEPARIN SOD (PORK) LOCK FLUSH 100 UNIT/ML IV SOLN
500.0000 [IU] | Freq: Once | INTRAVENOUS | Status: AC | PRN
  Administered 2016-03-20: 500 [IU]
  Filled 2016-03-20: qty 5

## 2016-03-20 MED ORDER — DIPHENHYDRAMINE HCL 25 MG PO CAPS
25.0000 mg | ORAL_CAPSULE | Freq: Once | ORAL | Status: AC
  Administered 2016-03-20: 25 mg via ORAL

## 2016-03-20 MED ORDER — ACETAMINOPHEN 325 MG PO TABS
ORAL_TABLET | ORAL | Status: AC
  Filled 2016-03-20: qty 2

## 2016-03-20 MED ORDER — SODIUM CHLORIDE 0.9 % IV SOLN
16.0000 mg/m2 | Freq: Once | INTRAVENOUS | Status: AC
  Administered 2016-03-20: 25 mg via INTRAVENOUS
  Filled 2016-03-20: qty 5

## 2016-03-20 MED ORDER — DIPHENHYDRAMINE HCL 25 MG PO CAPS
ORAL_CAPSULE | ORAL | Status: AC
  Filled 2016-03-20: qty 1

## 2016-03-20 MED ORDER — SODIUM CHLORIDE 0.9% FLUSH
10.0000 mL | INTRAVENOUS | Status: AC | PRN
  Administered 2016-03-20: 10 mL
  Filled 2016-03-20: qty 10

## 2016-03-20 MED ORDER — ACETAMINOPHEN 325 MG PO TABS
650.0000 mg | ORAL_TABLET | Freq: Once | ORAL | Status: AC
  Administered 2016-03-20: 650 mg via ORAL

## 2016-03-20 MED ORDER — PROCHLORPERAZINE MALEATE 10 MG PO TABS
ORAL_TABLET | ORAL | Status: AC
  Filled 2016-03-20: qty 1

## 2016-03-20 MED ORDER — PROCHLORPERAZINE MALEATE 10 MG PO TABS
10.0000 mg | ORAL_TABLET | Freq: Once | ORAL | Status: AC
  Administered 2016-03-20: 10 mg via ORAL

## 2016-03-20 NOTE — Patient Instructions (Signed)
[facility] Discharge Instructions for Patients Receiving Chemotherapy  Today you received the following chemotherapy agents Dacogen.  To help prevent nausea and vomiting after your treatment, we encourage you to take your nausea medication.   If you develop nausea and vomiting that is not controlled by your nausea medication, call the clinic.   BELOW ARE SYMPTOMS THAT SHOULD BE REPORTED IMMEDIATELY:  *FEVER GREATER THAN 100.5 F  *CHILLS WITH OR WITHOUT FEVER  NAUSEA AND VOMITING THAT IS NOT CONTROLLED WITH YOUR NAUSEA MEDICATION  *UNUSUAL SHORTNESS OF BREATH  *UNUSUAL BRUISING OR BLEEDING  TENDERNESS IN MOUTH AND THROAT WITH OR WITHOUT PRESENCE OF ULCERS  *URINARY PROBLEMS  *BOWEL PROBLEMS  UNUSUAL RASH Items with * indicate a potential emergency and should be followed up as soon as possible.  Feel free to call the clinic you have any questions or concerns. The clinic phone number is [phone].  Please show the [place] at check-in to the Emergency Department and triage nurse.    Blood Transfusion  A blood transfusion is a procedure in which you receive donated blood through an IV tube. You may need a blood transfusion because of illness, surgery, or injury. The blood may come from a donor, or it may be your own blood that you donated previously. The blood given in a transfusion is made up of different types of cells. You may receive:  Red blood cells. These carry oxygen and replace lost blood.  Platelets. These control bleeding.  Plasma. Thishelps blood to clot. If you have hemophilia or another clotting disorder, you may also receive other types of blood products. LET [facility] CARE PROVIDER KNOW ABOUT:  Any allergies you have.  All medicines you are taking, including vitamins, herbs, eye drops, creams, and over-the-counter medicines.  Previous problems you or members of your family have had with the use of anesthetics.  Any blood  disorders you have.  Previous surgeries you have had.  Any medical conditions you may have.  Any previous reactions you have had during a blood transfusion.  RISKS AND COMPLICATIONS Generally, this is a safe procedure. However, problems may occur, including:  Having an allergic reaction to something in the donated blood.  Fever. This may be a reaction to the white blood cells in the transfused blood.  Iron overload. This can happen from having many transfusions.  Transfusion-related acute lung injury (TRALI). This is a rare reaction that causes lung damage. The cause is not known.TRALI can occur within hours of a transfusion or several days later.  Sudden (acute) or delayed hemolytic reactions. This happens if your blood does not match the cells in your transfusion. Your body's defense system (immune system) may try to attack the new cells. This complication is rare.  Infection. This is rare. BEFORE THE PROCEDURE  You may have a blood test to determine your blood type. This is necessary to know what kind of blood your body will accept.  If you are going to have a planned surgery, you may donate your own blood. This may be done in case you need to have a transfusion.  If you have had an allergic reaction to a transfusion in the past, you may be given medicine to help prevent a reaction. Take this medicine only as directed by your health care provider.  You will have your temperature, blood pressure, and pulse monitored before the transfusion. PROCEDURE   An IV will be started in your hand or arm.  The  bag of donated blood will be attached to your IV tube and given into your vein.  Your temperature, blood pressure, and pulse will be monitored regularly during the transfusion. This monitoring is done to detect early signs of a transfusion reaction.  If you have any signs or symptoms of a reaction, your transfusion will be stopped and you may be given medicine.  When the  transfusion is over, your IV will be removed.  Pressure may be applied to the IV site for a few minutes.  A bandage (dressing) will be applied. The procedure may vary among health care providers and hospitals. AFTER THE PROCEDURE  Your blood pressure, temperature, and pulse will be monitored regularly.   This information is not intended to replace advice given to you by your health care provider. Make sure you discuss any questions you have with your health care provider.   Document Released: 05/11/2000 Document Revised: 06/04/2014 Document Reviewed: 03/24/2014 Elsevier Interactive Patient Education [employer].

## 2016-03-21 ENCOUNTER — Other Ambulatory Visit: Payer: Medicare Other

## 2016-03-21 ENCOUNTER — Encounter: Payer: Self-pay | Admitting: Hematology & Oncology

## 2016-03-21 LAB — TYPE AND SCREEN
ABO/RH(D): A POS
ANTIBODY SCREEN: NEGATIVE
UNIT DIVISION: 0

## 2016-03-26 ENCOUNTER — Other Ambulatory Visit: Payer: Self-pay | Admitting: Hematology & Oncology

## 2016-03-26 DIAGNOSIS — C9210 Chronic myeloid leukemia, BCR/ABL-positive, not having achieved remission: Secondary | ICD-10-CM

## 2016-03-26 DIAGNOSIS — D72829 Elevated white blood cell count, unspecified: Secondary | ICD-10-CM

## 2016-03-28 ENCOUNTER — Other Ambulatory Visit (HOSPITAL_BASED_OUTPATIENT_CLINIC_OR_DEPARTMENT_OTHER): Payer: Medicare Other

## 2016-03-28 ENCOUNTER — Ambulatory Visit (HOSPITAL_BASED_OUTPATIENT_CLINIC_OR_DEPARTMENT_OTHER): Payer: Medicare Other

## 2016-03-28 DIAGNOSIS — C9300 Acute monoblastic/monocytic leukemia, not having achieved remission: Secondary | ICD-10-CM

## 2016-03-28 LAB — CBC WITH DIFFERENTIAL (CANCER CENTER ONLY)
BASO#: 0.1 10*3/uL (ref 0.0–0.2)
BASO%: 2.9 % — AB (ref 0.0–2.0)
EOS%: 0.6 % (ref 0.0–7.0)
Eosinophils Absolute: 0 10*3/uL (ref 0.0–0.5)
HCT: 22.3 % — ABNORMAL LOW (ref 34.8–46.6)
HGB: 7.6 g/dL — ABNORMAL LOW (ref 11.6–15.9)
LYMPH#: 1.6 10*3/uL (ref 0.9–3.3)
LYMPH%: 50.8 % — AB (ref 14.0–48.0)
MCH: 33.5 pg (ref 26.0–34.0)
MCHC: 34.1 g/dL (ref 32.0–36.0)
MCV: 98 fL (ref 81–101)
MONO#: 0.1 10*3/uL (ref 0.1–0.9)
MONO%: 1.9 % (ref 0.0–13.0)
NEUT#: 1.4 10*3/uL — ABNORMAL LOW (ref 1.5–6.5)
NEUT%: 43.8 % (ref 39.6–80.0)
PLATELETS: 33 10*3/uL — AB (ref 145–400)
RBC: 2.27 10*6/uL — AB (ref 3.70–5.32)
RDW: 18.9 % — AB (ref 11.1–15.7)
WBC: 3.1 10*3/uL — AB (ref 3.9–10.0)

## 2016-03-28 LAB — CMP (CANCER CENTER ONLY)
ALK PHOS: 102 U/L — AB (ref 26–84)
ALT: 29 U/L (ref 10–47)
AST: 34 U/L (ref 11–38)
Albumin: 4.1 g/dL (ref 3.3–5.5)
BUN: 18 mg/dL (ref 7–22)
CHLORIDE: 97 meq/L — AB (ref 98–108)
CO2: 28 mEq/L (ref 18–33)
CREATININE: 0.7 mg/dL (ref 0.6–1.2)
Calcium: 7.7 mg/dL — ABNORMAL LOW (ref 8.0–10.3)
GLUCOSE: 137 mg/dL — AB (ref 73–118)
POTASSIUM: 3.5 meq/L (ref 3.3–4.7)
SODIUM: 132 meq/L (ref 128–145)
TOTAL PROTEIN: 6.2 g/dL — AB (ref 6.4–8.1)
Total Bilirubin: 1.4 mg/dl (ref 0.20–1.60)

## 2016-03-28 MED ORDER — SODIUM CHLORIDE 0.9% FLUSH
10.0000 mL | INTRAVENOUS | Status: DC | PRN
  Administered 2016-03-28: 10 mL via INTRAVENOUS
  Filled 2016-03-28: qty 10

## 2016-03-28 MED ORDER — HEPARIN SOD (PORK) LOCK FLUSH 100 UNIT/ML IV SOLN
500.0000 [IU] | Freq: Once | INTRAVENOUS | Status: AC
  Administered 2016-03-28: 500 [IU] via INTRAVENOUS
  Filled 2016-03-28: qty 5

## 2016-03-28 NOTE — Progress Notes (Signed)
Kristina Meyer presented for Portacath access and flush. Proper placement of portacath confirmed by CXR. Portacath located in the right chest wall accessed with  H 20 needle. Clean, Dry and Intact Good blood return present. Portacath flushed with 56ml NS and 500U/80ml Heparin per protocol and needle removed intact. Procedure without incident. Patient tolerated procedure well.

## 2016-03-28 NOTE — Patient Instructions (Signed)

## 2016-03-29 ENCOUNTER — Other Ambulatory Visit: Payer: Self-pay | Admitting: *Deleted

## 2016-03-29 DIAGNOSIS — C9300 Acute monoblastic/monocytic leukemia, not having achieved remission: Secondary | ICD-10-CM

## 2016-03-30 ENCOUNTER — Other Ambulatory Visit (HOSPITAL_BASED_OUTPATIENT_CLINIC_OR_DEPARTMENT_OTHER): Payer: Medicare Other

## 2016-03-30 ENCOUNTER — Ambulatory Visit (HOSPITAL_BASED_OUTPATIENT_CLINIC_OR_DEPARTMENT_OTHER): Payer: Medicare Other

## 2016-03-30 ENCOUNTER — Other Ambulatory Visit: Payer: Self-pay | Admitting: Family

## 2016-03-30 ENCOUNTER — Ambulatory Visit (HOSPITAL_COMMUNITY)
Admission: RE | Admit: 2016-03-30 | Discharge: 2016-03-30 | Disposition: A | Discharge status: Home / Self Care | Payer: Medicare Other | Source: Ambulatory Visit | Attending: Hematology & Oncology | Admitting: Hematology & Oncology

## 2016-03-30 VITALS — BP 112/51 | HR 86 | Temp 98.2°F | Resp 16

## 2016-03-30 DIAGNOSIS — T451X5A Adverse effect of antineoplastic and immunosuppressive drugs, initial encounter: Secondary | ICD-10-CM | POA: Insufficient documentation

## 2016-03-30 DIAGNOSIS — C9300 Acute monoblastic/monocytic leukemia, not having achieved remission: Secondary | ICD-10-CM | POA: Diagnosis present

## 2016-03-30 DIAGNOSIS — C9200 Acute myeloblastic leukemia, not having achieved remission: Secondary | ICD-10-CM

## 2016-03-30 DIAGNOSIS — D6481 Anemia due to antineoplastic chemotherapy: Secondary | ICD-10-CM | POA: Insufficient documentation

## 2016-03-30 LAB — CBC WITH DIFFERENTIAL (CANCER CENTER ONLY)
BASO#: 0.1 10*3/uL (ref 0.0–0.2)
BASO%: 3.4 % — AB (ref 0.0–2.0)
EOS ABS: 0 10*3/uL (ref 0.0–0.5)
EOS%: 0.9 % (ref 0.0–7.0)
HCT: 21.9 % — ABNORMAL LOW (ref 34.8–46.6)
HEMOGLOBIN: 7.4 g/dL — AB (ref 11.6–15.9)
LYMPH#: 1.2 10*3/uL (ref 0.9–3.3)
LYMPH%: 53 % — AB (ref 14.0–48.0)
MCH: 33.5 pg (ref 26.0–34.0)
MCHC: 33.8 g/dL (ref 32.0–36.0)
MCV: 99 fL (ref 81–101)
MONO#: 0.1 10*3/uL (ref 0.1–0.9)
MONO%: 2.1 % (ref 0.0–13.0)
NEUT%: 40.6 % (ref 39.6–80.0)
NEUTROS ABS: 1 10*3/uL — AB (ref 1.5–6.5)
PLATELETS: 37 10*3/uL — AB (ref 145–400)
RBC: 2.21 10*6/uL — AB (ref 3.70–5.32)
RDW: 18.7 % — ABNORMAL HIGH (ref 11.1–15.7)
WBC: 2.3 10*3/uL — AB (ref 3.9–10.0)

## 2016-03-30 MED ORDER — HEPARIN SOD (PORK) LOCK FLUSH 100 UNIT/ML IV SOLN
500.0000 [IU] | Freq: Once | INTRAVENOUS | Status: AC
  Administered 2016-03-30: 500 [IU] via INTRAVENOUS
  Filled 2016-03-30: qty 5

## 2016-03-30 MED ORDER — SODIUM CHLORIDE 0.9% FLUSH
10.0000 mL | INTRAVENOUS | Status: DC | PRN
  Administered 2016-03-30: 10 mL via INTRAVENOUS
  Filled 2016-03-30: qty 10

## 2016-03-30 NOTE — Patient Instructions (Signed)

## 2016-03-31 ENCOUNTER — Ambulatory Visit (HOSPITAL_BASED_OUTPATIENT_CLINIC_OR_DEPARTMENT_OTHER): Payer: Medicare Other

## 2016-03-31 DIAGNOSIS — D6481 Anemia due to antineoplastic chemotherapy: Secondary | ICD-10-CM | POA: Diagnosis not present

## 2016-03-31 DIAGNOSIS — C9300 Acute monoblastic/monocytic leukemia, not having achieved remission: Secondary | ICD-10-CM

## 2016-03-31 DIAGNOSIS — T451X5A Adverse effect of antineoplastic and immunosuppressive drugs, initial encounter: Secondary | ICD-10-CM

## 2016-03-31 LAB — PREPARE RBC (CROSSMATCH)

## 2016-03-31 MED ORDER — SODIUM CHLORIDE 0.9% FLUSH
10.0000 mL | INTRAVENOUS | Status: AC | PRN
  Administered 2016-03-31: 10 mL
  Filled 2016-03-31: qty 10

## 2016-03-31 MED ORDER — ACETAMINOPHEN 325 MG PO TABS
ORAL_TABLET | ORAL | Status: AC
  Filled 2016-03-31: qty 2

## 2016-03-31 MED ORDER — FUROSEMIDE 10 MG/ML IJ SOLN
20.0000 mg | Freq: Once | INTRAMUSCULAR | Status: AC
  Administered 2016-03-31: 20 mg via INTRAVENOUS

## 2016-03-31 MED ORDER — FUROSEMIDE 10 MG/ML IJ SOLN
INTRAMUSCULAR | Status: AC
  Filled 2016-03-31: qty 2

## 2016-03-31 MED ORDER — DIPHENHYDRAMINE HCL 25 MG PO CAPS
25.0000 mg | ORAL_CAPSULE | Freq: Once | ORAL | Status: AC
  Administered 2016-03-31: 25 mg via ORAL

## 2016-03-31 MED ORDER — SODIUM CHLORIDE 0.9 % IV SOLN
250.0000 mL | Freq: Once | INTRAVENOUS | Status: AC
  Administered 2016-03-31: 250 mL via INTRAVENOUS

## 2016-03-31 MED ORDER — DIPHENHYDRAMINE HCL 25 MG PO CAPS
ORAL_CAPSULE | ORAL | Status: AC
  Filled 2016-03-31: qty 1

## 2016-03-31 MED ORDER — ACETAMINOPHEN 325 MG PO TABS
650.0000 mg | ORAL_TABLET | Freq: Once | ORAL | Status: AC
  Administered 2016-03-31: 650 mg via ORAL

## 2016-03-31 MED ORDER — HEPARIN SOD (PORK) LOCK FLUSH 100 UNIT/ML IV SOLN
500.0000 [IU] | Freq: Every day | INTRAVENOUS | Status: AC | PRN
  Administered 2016-03-31: 500 [IU]
  Filled 2016-03-31: qty 5

## 2016-03-31 NOTE — Patient Instructions (Signed)

## 2016-04-02 LAB — TYPE AND SCREEN
ABO/RH(D): A POS
ANTIBODY SCREEN: NEGATIVE
UNIT DIVISION: 0
UNIT DIVISION: 0

## 2016-04-04 ENCOUNTER — Other Ambulatory Visit (HOSPITAL_BASED_OUTPATIENT_CLINIC_OR_DEPARTMENT_OTHER): Payer: Medicare Other

## 2016-04-04 ENCOUNTER — Ambulatory Visit (HOSPITAL_BASED_OUTPATIENT_CLINIC_OR_DEPARTMENT_OTHER): Payer: Medicare Other

## 2016-04-04 VITALS — BP 129/52 | HR 78 | Temp 97.8°F | Resp 16

## 2016-04-04 DIAGNOSIS — C9300 Acute monoblastic/monocytic leukemia, not having achieved remission: Secondary | ICD-10-CM

## 2016-04-04 DIAGNOSIS — R7302 Impaired glucose tolerance (oral): Secondary | ICD-10-CM | POA: Insufficient documentation

## 2016-04-04 DIAGNOSIS — Z8739 Personal history of other diseases of the musculoskeletal system and connective tissue: Secondary | ICD-10-CM | POA: Insufficient documentation

## 2016-04-04 DIAGNOSIS — E785 Hyperlipidemia, unspecified: Secondary | ICD-10-CM | POA: Insufficient documentation

## 2016-04-04 DIAGNOSIS — L719 Rosacea, unspecified: Secondary | ICD-10-CM | POA: Insufficient documentation

## 2016-04-04 DIAGNOSIS — F419 Anxiety disorder, unspecified: Secondary | ICD-10-CM | POA: Insufficient documentation

## 2016-04-04 DIAGNOSIS — Z95828 Presence of other vascular implants and grafts: Secondary | ICD-10-CM

## 2016-04-04 DIAGNOSIS — Z8679 Personal history of other diseases of the circulatory system: Secondary | ICD-10-CM | POA: Insufficient documentation

## 2016-04-04 LAB — CBC WITH DIFFERENTIAL (CANCER CENTER ONLY)
BASO#: 0.1 10*3/uL (ref 0.0–0.2)
BASO%: 3 % — ABNORMAL HIGH (ref 0.0–2.0)
EOS%: 0.4 % (ref 0.0–7.0)
Eosinophils Absolute: 0 10*3/uL (ref 0.0–0.5)
HEMATOCRIT: 29.1 % — AB (ref 34.8–46.6)
HGB: 10.1 g/dL — ABNORMAL LOW (ref 11.6–15.9)
LYMPH#: 1.3 10*3/uL (ref 0.9–3.3)
LYMPH%: 57.6 % — AB (ref 14.0–48.0)
MCH: 33.1 pg (ref 26.0–34.0)
MCHC: 34.7 g/dL (ref 32.0–36.0)
MCV: 95 fL (ref 81–101)
MONO#: 0.1 10*3/uL (ref 0.1–0.9)
MONO%: 3 % (ref 0.0–13.0)
NEUT#: 0.8 10*3/uL — ABNORMAL LOW (ref 1.5–6.5)
NEUT%: 36 % — AB (ref 39.6–80.0)
Platelets: 134 10*3/uL — ABNORMAL LOW (ref 145–400)
RBC: 3.05 10*6/uL — ABNORMAL LOW (ref 3.70–5.32)
RDW: 18.2 % — AB (ref 11.1–15.7)
WBC: 2.3 10*3/uL — ABNORMAL LOW (ref 3.9–10.0)

## 2016-04-04 LAB — CMP (CANCER CENTER ONLY)
ALK PHOS: 94 U/L — AB (ref 26–84)
ALT(SGPT): 26 U/L (ref 10–47)
AST: 31 U/L (ref 11–38)
Albumin: 3.8 g/dL (ref 3.3–5.5)
BUN, Bld: 13 mg/dL (ref 7–22)
CALCIUM: 9 mg/dL (ref 8.0–10.3)
CHLORIDE: 98 meq/L (ref 98–108)
CO2: 29 meq/L (ref 18–33)
Creat: 0.7 mg/dl (ref 0.6–1.2)
GLUCOSE: 158 mg/dL — AB (ref 73–118)
POTASSIUM: 3.4 meq/L (ref 3.3–4.7)
Sodium: 136 mEq/L (ref 128–145)
Total Bilirubin: 0.9 mg/dl (ref 0.20–1.60)
Total Protein: 6.3 g/dL — ABNORMAL LOW (ref 6.4–8.1)

## 2016-04-04 MED ORDER — SODIUM CHLORIDE 0.9% FLUSH
10.0000 mL | INTRAVENOUS | Status: DC | PRN
  Administered 2016-04-04: 10 mL via INTRAVENOUS
  Filled 2016-04-04: qty 10

## 2016-04-04 MED ORDER — HEPARIN SOD (PORK) LOCK FLUSH 100 UNIT/ML IV SOLN
500.0000 [IU] | Freq: Once | INTRAVENOUS | Status: AC
  Administered 2016-04-04: 500 [IU] via INTRAVENOUS
  Filled 2016-04-04: qty 5

## 2016-04-04 NOTE — Patient Instructions (Signed)

## 2016-04-09 ENCOUNTER — Other Ambulatory Visit (HOSPITAL_BASED_OUTPATIENT_CLINIC_OR_DEPARTMENT_OTHER): Payer: Medicare Other

## 2016-04-09 ENCOUNTER — Ambulatory Visit (HOSPITAL_BASED_OUTPATIENT_CLINIC_OR_DEPARTMENT_OTHER): Payer: Medicare Other

## 2016-04-09 ENCOUNTER — Encounter: Payer: Self-pay | Admitting: Hematology & Oncology

## 2016-04-09 ENCOUNTER — Ambulatory Visit (HOSPITAL_BASED_OUTPATIENT_CLINIC_OR_DEPARTMENT_OTHER): Payer: Medicare Other | Admitting: Hematology & Oncology

## 2016-04-09 VITALS — BP 142/92 | HR 82 | Temp 98.0°F | Wt 135.1 lb

## 2016-04-09 DIAGNOSIS — C9300 Acute monoblastic/monocytic leukemia, not having achieved remission: Secondary | ICD-10-CM

## 2016-04-09 DIAGNOSIS — Z5111 Encounter for antineoplastic chemotherapy: Secondary | ICD-10-CM | POA: Diagnosis not present

## 2016-04-09 LAB — CBC WITH DIFFERENTIAL (CANCER CENTER ONLY)
BASO#: 0.1 10*3/uL (ref 0.0–0.2)
BASO%: 3.8 % — ABNORMAL HIGH (ref 0.0–2.0)
EOS ABS: 0 10*3/uL (ref 0.0–0.5)
EOS%: 1 % (ref 0.0–7.0)
HCT: 29.8 % — ABNORMAL LOW (ref 34.8–46.6)
HEMOGLOBIN: 10 g/dL — AB (ref 11.6–15.9)
LYMPH#: 1 10*3/uL (ref 0.9–3.3)
LYMPH%: 45.5 % (ref 14.0–48.0)
MCH: 33.1 pg (ref 26.0–34.0)
MCHC: 33.6 g/dL (ref 32.0–36.0)
MCV: 99 fL (ref 81–101)
MONO#: 0.1 10*3/uL (ref 0.1–0.9)
MONO%: 2.4 % (ref 0.0–13.0)
NEUT#: 1 10*3/uL — ABNORMAL LOW (ref 1.5–6.5)
NEUT%: 47.3 % (ref 39.6–80.0)
PLATELETS: 407 10*3/uL — AB (ref 145–400)
RBC: 3.02 10*6/uL — ABNORMAL LOW (ref 3.70–5.32)
RDW: 18.9 % — AB (ref 11.1–15.7)
WBC: 2.1 10*3/uL — ABNORMAL LOW (ref 3.9–10.0)

## 2016-04-09 LAB — CMP (CANCER CENTER ONLY)
ALT(SGPT): 27 U/L (ref 10–47)
AST: 35 U/L (ref 11–38)
Albumin: 3.7 g/dL (ref 3.3–5.5)
Alkaline Phosphatase: 91 U/L — ABNORMAL HIGH (ref 26–84)
BILIRUBIN TOTAL: 0.9 mg/dL (ref 0.20–1.60)
BUN: 9 mg/dL (ref 7–22)
CALCIUM: 9.5 mg/dL (ref 8.0–10.3)
CO2: 28 meq/L (ref 18–33)
Chloride: 101 mEq/L (ref 98–108)
Creat: 0.8 mg/dl (ref 0.6–1.2)
GLUCOSE: 124 mg/dL — AB (ref 73–118)
Potassium: 3.6 mEq/L (ref 3.3–4.7)
Sodium: 139 mEq/L (ref 128–145)
Total Protein: 6.5 g/dL (ref 6.4–8.1)

## 2016-04-09 LAB — LACTATE DEHYDROGENASE: LDH: 211 U/L (ref 125–245)

## 2016-04-09 MED ORDER — PROCHLORPERAZINE MALEATE 10 MG PO TABS
10.0000 mg | ORAL_TABLET | Freq: Once | ORAL | Status: AC
  Administered 2016-04-09: 10 mg via ORAL

## 2016-04-09 MED ORDER — SODIUM CHLORIDE 0.9% FLUSH
10.0000 mL | INTRAVENOUS | Status: DC | PRN
  Administered 2016-04-09: 10 mL
  Filled 2016-04-09: qty 10

## 2016-04-09 MED ORDER — SODIUM CHLORIDE 0.9 % IV SOLN
16.0000 mg/m2 | Freq: Once | INTRAVENOUS | Status: AC
  Administered 2016-04-09: 25 mg via INTRAVENOUS
  Filled 2016-04-09: qty 5

## 2016-04-09 MED ORDER — PROCHLORPERAZINE MALEATE 10 MG PO TABS
ORAL_TABLET | ORAL | Status: AC
  Filled 2016-04-09: qty 1

## 2016-04-09 MED ORDER — HEPARIN SOD (PORK) LOCK FLUSH 100 UNIT/ML IV SOLN
500.0000 [IU] | Freq: Once | INTRAVENOUS | Status: AC | PRN
  Administered 2016-04-09: 500 [IU]
  Filled 2016-04-09: qty 5

## 2016-04-09 MED ORDER — SODIUM CHLORIDE 0.9 % IV SOLN
Freq: Once | INTRAVENOUS | Status: AC
  Administered 2016-04-09: 09:00:00 via INTRAVENOUS

## 2016-04-09 NOTE — Progress Notes (Signed)
Hematology and Oncology Follow Up Visit  Kristina Meyer [id number] Apr 24, 1941 75 y.o. 04/09/2016   Principle Diagnosis:   Acute monocytic leukemia - normal cytogenetics  Current Therapy:    Status post cycle #3 of Dacogen-7 day cycle     Interim History:  [name] is  back for follow-up. She really looks good. She has tolerated her first 3 cycles of Dacogen very very nicely. She has shown a very tough resilience.  She had no nausea or vomiting with the Dacogen. There is no bleeding. She's had no fever.  We have  had to transfuse her. I think we last transfuse her about 2 weeks ago. Since we started treating her, the she's only had a couple transfusions.   Her platelet count has been coming up. Everything has looked encouraging with respect to a response. I have to think that she is responding.  We tried to get NGS on her peripheral blood. We could not because there were no circulating leukemia cells. Again, I have to believe this is very encouraging.  Her appetite is good. She has had no nausea or vomiting. She has had no diarrhea. There's been no constipation.  She's not noted any leg swelling. She's had no headache. She's had no mouth sores.  She is looking for 2 Thanksgiving. She and her family probably will eat out which I think is a great idea.  Overall, her performance status has to be ECOG 1.  Medications:  Current Outpatient Prescriptions:  .  ALPRAZolam (XANAX) 0.5 MG tablet, TAKE 1 TABLET BY MOUTH 3 TIMES A DAY AS NEEDED, Disp: 60 tablet, Rfl: 2 .  aspirin 81 MG tablet, Take by mouth., Disp: , Rfl:  .  atorvastatin (LIPITOR) 20 MG tablet, Take 10 mg by mouth at bedtime., Disp: , Rfl:  .  Calcium-Magnesium-Vitamin D (CALCIUM 1200+D3 PO), Take 1 tablet by mouth daily., Disp: , Rfl:  .  cholecalciferol (VITAMIN D) 1000 UNITS tablet, Take 1,000 Units by mouth daily., Disp: , Rfl:  .  [START ON 12/08/2016] hydrochlorothiazide (HYDRODIURIL) 25 MG tablet, Take by mouth., Disp: ,  Rfl:  .  lidocaine-prilocaine (EMLA) cream, Apply 1 application topically as needed. Place quarter sized amount of cream directly on portacath and cover with saran wrap at least 1 - 1 1/2 hours prior to procedure., Disp: 30 g, Rfl: 3 .  Multiple Vitamin (MULTIVITAMIN) LIQD, Take 5 mLs by mouth daily., Disp: , Rfl:  .  Multiple Vitamins-Minerals (MULTIVITAMIN ADULT) TABS, Take 1 tablet by mouth daily., Disp: , Rfl:  .  orphenadrine (NORFLEX) 100 MG tablet, Take 1 tablet (100 mg total) by mouth at bedtime as needed for muscle spasms., Disp: 30 tablet, Rfl: 2 .  PARoxetine (PAXIL) 20 MG tablet, Take 20 mg by mouth daily., Disp: , Rfl:  .  potassium chloride SA (K-DUR,KLOR-CON) 20 MEQ tablet, Take 1 pill twice a day for 7 days, then 1 pill a day., Disp: 60 tablet, Rfl: 3 .  prochlorperazine (COMPAZINE) 5 MG tablet, Take 1 tablet (5 mg total) by mouth every 6 (six) hours as needed for nausea or vomiting., Disp: 60 tablet, Rfl: 0 .  sulfamethoxazole-trimethoprim (BACTRIM DS) 800-160 MG per tablet, Take 1 tablet by mouth daily., Disp: , Rfl:   Allergies:  Allergies  Allergen Reactions  . Other Other (See Comments)    GENERAL Anesthesia, vomiting    Past Medical History, Surgical history, Social history, and Family History were reviewed and updated.  Review of Systems:  As above  Physical Exam:  weight is 135 lb 1.6 oz (61.3 kg). Her oral temperature is 98 F (36.7 C). Her blood pressure is 142/92 (abnormal) and her pulse is 82.   Wt Readings from Last 3 Encounters:  04/09/16 135 lb 1.6 oz (61.3 kg)  03/12/16 136 lb (61.7 kg)  02/27/16 137 lb (62.1 kg)      Well-developed and well-nourished white female in no obvious distress. Head and neck exam shows no ocular or oral lesions. There are no palpable cervical or supra clavicular lymph nodes. Lungs are clear bilaterally. Cardiac exam regular rate and rhythm with no murmurs, rubs or bruits. Abdomen is soft. She has good bowel sounds. There is  no fluid wave. There is no palpable liver or spleen tip. Back exam shows no tenderness over the spine, ribs or hips. Externa shows no clubbing, cyanosis or edema. She has good range of motion of her joints. Neurological exam shows no focal neurological deficits.  Lab Results  Component Value Date   WBC 2.1 (L) 04/09/2016   HGB 10.0 (L) 04/09/2016   HCT 29.8 (L) 04/09/2016   MCV 99 04/09/2016   PLT 407 (H) 04/09/2016     Chemistry      Component Value Date/Time   NA 136 04/04/2016 1250   NA 139 02/08/2016 1009   K 3.4 04/04/2016 1250   K 4.1 02/08/2016 1009   CL 98 04/04/2016 1250   CO2 29 04/04/2016 1250   CO2 24 02/08/2016 1009   BUN 13 04/04/2016 1250   BUN 11.3 02/08/2016 1009   CREATININE 0.7 04/04/2016 1250   CREATININE 0.8 02/08/2016 1009      Component Value Date/Time   CALCIUM 9.0 04/04/2016 1250   CALCIUM 9.2 02/08/2016 1009   ALKPHOS 94 (H) 04/04/2016 1250   ALKPHOS 67 02/08/2016 1009   AST 31 04/04/2016 1250   AST 34 02/08/2016 1009   ALT 26 04/04/2016 1250   ALT 22 02/08/2016 1009   BILITOT 0.90 04/04/2016 1250   BILITOT 0.60 02/08/2016 1009         Impression and Plan: 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 Her cytogenetics are normal which I would think would be a good prognostic sign.   We will go ahead with her 4th cycle of chemotherapy.   After this cycle, we will then do a bone marrow test on her. I probably will get a bone marrow test done sometime in mid December.   By her platelet count, I would have to think that her response to treatment is a encouraging .   Her hemoglobin is holding steady.. We will have to watch out for this.    We will plan to get her back in 2 weeks for follow-up.    Volanda Napoleon, MD 11/13/20178:27 AM

## 2016-04-09 NOTE — Patient Instructions (Signed)
Decitabine injection for infusion What is this medicine? DECITABINE (dee SYE ta been) is a chemotherapy drug. This medicine reduces the growth of cancer cells. It is used to treat adults with myelodysplastic syndromes. This medicine may be used for other purposes; ask your health care provider or pharmacist if you have questions. What should I tell my health care provider before I take this medicine? They need to know if you have any of these conditions: -infection (especially a virus infection such as chickenpox, cold sores, or herpes) -kidney disease -liver disease -an unusual or allergic reaction to decitabine, other medicines, foods, dyes, or preservatives -pregnant or trying to get pregnant -breast-feeding How should I use this medicine? This medicine is for infusion into a vein. It is administered in a hospital or clinic by a doctor or health care professional. Talk to your pediatrician regarding the use of this medicine in children. Special care may be needed. Overdosage: If you think you have taken too much of this medicine contact a poison control center or emergency room at once. NOTE: This medicine is only for you. Do not share this medicine with others. What if I miss a dose? It is important not to miss your dose. Call your doctor or health care professional if you are unable to keep an appointment. What may interact with this medicine? -vaccines Talk to your doctor or health care professional before taking any of these medicines: -aspirin -acetaminophen -ibuprofen -ketoprofen -naproxen This list may not describe all possible interactions. Give your health care provider a list of all the medicines, herbs, non-prescription drugs, or dietary supplements you use. Also tell them if you smoke, drink alcohol, or use illegal drugs. Some items may interact with your medicine. What should I watch for while using this medicine? Visit your doctor for checks on your progress. This drug  may make you feel generally unwell. This is not uncommon, as chemotherapy can affect healthy cells as well as cancer cells. Report any side effects. Continue your course of treatment even though you feel ill unless your doctor tells you to stop. In some cases, you may be given additional medicines to help with side effects. Follow all directions for their use. Call your doctor or health care professional for advice if you get a fever, chills or sore throat, or other symptoms of a cold or flu. Do not treat yourself. This drug decreases your body's ability to fight infections. Try to avoid being around people who are sick. This medicine may increase your risk to bruise or bleed. Call your doctor or health care professional if you notice any unusual bleeding. Do not become pregnant while taking this medicine or for at least 1 month after stopping it. Women should inform their doctor if they wish to become pregnant or think they might be pregnant. Men should not father a child while taking this medicine and for at least 2 months after stopping it. There is a potential for serious side effects to an unborn child. Talk to your health care professional or pharmacist for more information. Do not breast-feed an infant while taking this medicine. What side effects may I notice from receiving this medicine? Side effects that you should report to your doctor or health care professional as soon as possible: -low blood counts - this medicine may decrease the number of white blood cells, red blood cells and platelets. You may be at increased risk for infections and bleeding. -signs of infection - fever or chills, cough, sore throat,  pain or difficulty passing urine -signs of decreased platelets or bleeding - bruising, pinpoint red spots on the skin, black, tarry stools, blood in the urine -signs of decreased red blood cells - unusual weakness or tiredness, fainting spells, lightheadedness -increased blood sugar Side  effects that usually do not require medical attention (report to your prescriber or health care professional if they continue or are bothersome): -constipation -diarrhea -headache -loss of appetite -nausea, vomiting -skin rash, itching -stomach pain -water retention -weak or tired This list may not describe all possible side effects. Call your doctor for medical advice about side effects. You may report side effects to FDA at 1-800-FDA-1088. Where should I keep my medicine? This drug is given in a hospital or clinic and will not be stored at home. NOTE: This sheet is a summary. It may not cover all possible information. If you have questions about this medicine, talk to your doctor, pharmacist, or health care provider.    2016, [employer]vier/[employer] Standard. (2014-12-14 12:56:02)

## 2016-04-10 ENCOUNTER — Ambulatory Visit (HOSPITAL_BASED_OUTPATIENT_CLINIC_OR_DEPARTMENT_OTHER): Payer: Medicare Other

## 2016-04-10 VITALS — BP 99/59 | HR 72 | Temp 98.2°F | Resp 16

## 2016-04-10 DIAGNOSIS — C9300 Acute monoblastic/monocytic leukemia, not having achieved remission: Secondary | ICD-10-CM | POA: Diagnosis not present

## 2016-04-10 DIAGNOSIS — Z5111 Encounter for antineoplastic chemotherapy: Secondary | ICD-10-CM

## 2016-04-10 MED ORDER — SODIUM CHLORIDE 0.9 % IV SOLN
16.0000 mg/m2 | Freq: Once | INTRAVENOUS | Status: AC
  Administered 2016-04-10: 25 mg via INTRAVENOUS
  Filled 2016-04-10: qty 5

## 2016-04-10 MED ORDER — PROCHLORPERAZINE MALEATE 10 MG PO TABS
10.0000 mg | ORAL_TABLET | Freq: Once | ORAL | Status: AC
  Administered 2016-04-10: 10 mg via ORAL

## 2016-04-10 MED ORDER — SODIUM CHLORIDE 0.9 % IV SOLN
Freq: Once | INTRAVENOUS | Status: AC
  Administered 2016-04-10: 11:00:00 via INTRAVENOUS

## 2016-04-10 MED ORDER — SODIUM CHLORIDE 0.9% FLUSH
10.0000 mL | INTRAVENOUS | Status: DC | PRN
  Administered 2016-04-10: 10 mL
  Filled 2016-04-10: qty 10

## 2016-04-10 MED ORDER — HEPARIN SOD (PORK) LOCK FLUSH 100 UNIT/ML IV SOLN
500.0000 [IU] | Freq: Once | INTRAVENOUS | Status: AC | PRN
  Administered 2016-04-10: 500 [IU]
  Filled 2016-04-10: qty 5

## 2016-04-10 MED ORDER — PROCHLORPERAZINE MALEATE 10 MG PO TABS
ORAL_TABLET | ORAL | Status: AC
  Filled 2016-04-10: qty 1

## 2016-04-10 NOTE — Patient Instructions (Signed)
Decitabine injection for infusion What is this medicine? DECITABINE (dee SYE ta been) is a chemotherapy drug. This medicine reduces the growth of cancer cells. It is used to treat adults with myelodysplastic syndromes. This medicine may be used for other purposes; ask your health care provider or pharmacist if you have questions. What should I tell my health care provider before I take this medicine? They need to know if you have any of these conditions: -infection (especially a virus infection such as chickenpox, cold sores, or herpes) -kidney disease -liver disease -an unusual or allergic reaction to decitabine, other medicines, foods, dyes, or preservatives -pregnant or trying to get pregnant -breast-feeding How should I use this medicine? This medicine is for infusion into a vein. It is administered in a hospital or clinic by a doctor or health care professional. Talk to your pediatrician regarding the use of this medicine in children. Special care may be needed. Overdosage: If you think you have taken too much of this medicine contact a poison control center or emergency room at once. NOTE: This medicine is only for you. Do not share this medicine with others. What if I miss a dose? It is important not to miss your dose. Call your doctor or health care professional if you are unable to keep an appointment. What may interact with this medicine? -vaccines Talk to your doctor or health care professional before taking any of these medicines: -aspirin -acetaminophen -ibuprofen -ketoprofen -naproxen This list may not describe all possible interactions. Give your health care provider a list of all the medicines, herbs, non-prescription drugs, or dietary supplements you use. Also tell them if you smoke, drink alcohol, or use illegal drugs. Some items may interact with your medicine. What should I watch for while using this medicine? Visit your doctor for checks on your progress. This drug  may make you feel generally unwell. This is not uncommon, as chemotherapy can affect healthy cells as well as cancer cells. Report any side effects. Continue your course of treatment even though you feel ill unless your doctor tells you to stop. In some cases, you may be given additional medicines to help with side effects. Follow all directions for their use. Call your doctor or health care professional for advice if you get a fever, chills or sore throat, or other symptoms of a cold or flu. Do not treat yourself. This drug decreases your body's ability to fight infections. Try to avoid being around people who are sick. This medicine may increase your risk to bruise or bleed. Call your doctor or health care professional if you notice any unusual bleeding. Do not become pregnant while taking this medicine or for at least 1 month after stopping it. Women should inform their doctor if they wish to become pregnant or think they might be pregnant. Men should not father a child while taking this medicine and for at least 2 months after stopping it. There is a potential for serious side effects to an unborn child. Talk to your health care professional or pharmacist for more information. Do not breast-feed an infant while taking this medicine. What side effects may I notice from receiving this medicine? Side effects that you should report to your doctor or health care professional as soon as possible: -low blood counts - this medicine may decrease the number of white blood cells, red blood cells and platelets. You may be at increased risk for infections and bleeding. -signs of infection - fever or chills, cough, sore throat,  pain or difficulty passing urine -signs of decreased platelets or bleeding - bruising, pinpoint red spots on the skin, black, tarry stools, blood in the urine -signs of decreased red blood cells - unusual weakness or tiredness, fainting spells, lightheadedness -increased blood sugar Side  effects that usually do not require medical attention (report to your prescriber or health care professional if they continue or are bothersome): -constipation -diarrhea -headache -loss of appetite -nausea, vomiting -skin rash, itching -stomach pain -water retention -weak or tired This list may not describe all possible side effects. Call your doctor for medical advice about side effects. You may report side effects to FDA at 1-800-FDA-1088. Where should I keep my medicine? This drug is given in a hospital or clinic and will not be stored at home. NOTE: This sheet is a summary. It may not cover all possible information. If you have questions about this medicine, talk to your doctor, pharmacist, or health care provider.    2016, [employer]vier/[employer] Standard. (2014-12-14 12:56:02)

## 2016-04-11 ENCOUNTER — Other Ambulatory Visit: Payer: Medicare Other

## 2016-04-11 ENCOUNTER — Ambulatory Visit (HOSPITAL_BASED_OUTPATIENT_CLINIC_OR_DEPARTMENT_OTHER): Payer: Medicare Other

## 2016-04-11 VITALS — BP 115/52 | HR 76 | Temp 98.2°F | Resp 16

## 2016-04-11 DIAGNOSIS — Z5111 Encounter for antineoplastic chemotherapy: Secondary | ICD-10-CM

## 2016-04-11 DIAGNOSIS — C9300 Acute monoblastic/monocytic leukemia, not having achieved remission: Secondary | ICD-10-CM

## 2016-04-11 MED ORDER — SODIUM CHLORIDE 0.9 % IV SOLN
Freq: Once | INTRAVENOUS | Status: AC
  Administered 2016-04-11: 11:00:00 via INTRAVENOUS

## 2016-04-11 MED ORDER — SODIUM CHLORIDE 0.9 % IV SOLN
16.0000 mg/m2 | Freq: Once | INTRAVENOUS | Status: AC
  Administered 2016-04-11: 25 mg via INTRAVENOUS
  Filled 2016-04-11: qty 5

## 2016-04-11 MED ORDER — SODIUM CHLORIDE 0.9% FLUSH
10.0000 mL | INTRAVENOUS | Status: DC | PRN
  Administered 2016-04-11: 10 mL
  Filled 2016-04-11: qty 10

## 2016-04-11 MED ORDER — PROCHLORPERAZINE MALEATE 10 MG PO TABS
ORAL_TABLET | ORAL | Status: AC
  Filled 2016-04-11: qty 1

## 2016-04-11 MED ORDER — HEPARIN SOD (PORK) LOCK FLUSH 100 UNIT/ML IV SOLN
500.0000 [IU] | Freq: Once | INTRAVENOUS | Status: AC | PRN
  Administered 2016-04-11: 500 [IU]
  Filled 2016-04-11: qty 5

## 2016-04-11 MED ORDER — PROCHLORPERAZINE MALEATE 10 MG PO TABS
10.0000 mg | ORAL_TABLET | Freq: Once | ORAL | Status: AC
  Administered 2016-04-11: 10 mg via ORAL

## 2016-04-11 NOTE — Patient Instructions (Signed)
Decitabine injection for infusion What is this medicine? DECITABINE (dee SYE ta been) is a chemotherapy drug. This medicine reduces the growth of cancer cells. It is used to treat adults with myelodysplastic syndromes. This medicine may be used for other purposes; ask your health care provider or pharmacist if you have questions. What should I tell my health care provider before I take this medicine? They need to know if you have any of these conditions: -infection (especially a virus infection such as chickenpox, cold sores, or herpes) -kidney disease -liver disease -an unusual or allergic reaction to decitabine, other medicines, foods, dyes, or preservatives -pregnant or trying to get pregnant -breast-feeding How should I use this medicine? This medicine is for infusion into a vein. It is administered in a hospital or clinic by a doctor or health care professional. Talk to your pediatrician regarding the use of this medicine in children. Special care may be needed. Overdosage: If you think you have taken too much of this medicine contact a poison control center or emergency room at once. NOTE: This medicine is only for you. Do not share this medicine with others. What if I miss a dose? It is important not to miss your dose. Call your doctor or health care professional if you are unable to keep an appointment. What may interact with this medicine? -vaccines Talk to your doctor or health care professional before taking any of these medicines: -aspirin -acetaminophen -ibuprofen -ketoprofen -naproxen This list may not describe all possible interactions. Give your health care provider a list of all the medicines, herbs, non-prescription drugs, or dietary supplements you use. Also tell them if you smoke, drink alcohol, or use illegal drugs. Some items may interact with your medicine. What should I watch for while using this medicine? Visit your doctor for checks on your progress. This drug  may make you feel generally unwell. This is not uncommon, as chemotherapy can affect healthy cells as well as cancer cells. Report any side effects. Continue your course of treatment even though you feel ill unless your doctor tells you to stop. In some cases, you may be given additional medicines to help with side effects. Follow all directions for their use. Call your doctor or health care professional for advice if you get a fever, chills or sore throat, or other symptoms of a cold or flu. Do not treat yourself. This drug decreases your body's ability to fight infections. Try to avoid being around people who are sick. This medicine may increase your risk to bruise or bleed. Call your doctor or health care professional if you notice any unusual bleeding. Do not become pregnant while taking this medicine or for at least 1 month after stopping it. Women should inform their doctor if they wish to become pregnant or think they might be pregnant. Men should not father a child while taking this medicine and for at least 2 months after stopping it. There is a potential for serious side effects to an unborn child. Talk to your health care professional or pharmacist for more information. Do not breast-feed an infant while taking this medicine. What side effects may I notice from receiving this medicine? Side effects that you should report to your doctor or health care professional as soon as possible: -low blood counts - this medicine may decrease the number of white blood cells, red blood cells and platelets. You may be at increased risk for infections and bleeding. -signs of infection - fever or chills, cough, sore throat,  pain or difficulty passing urine -signs of decreased platelets or bleeding - bruising, pinpoint red spots on the skin, black, tarry stools, blood in the urine -signs of decreased red blood cells - unusual weakness or tiredness, fainting spells, lightheadedness -increased blood sugar Side  effects that usually do not require medical attention (report to your prescriber or health care professional if they continue or are bothersome): -constipation -diarrhea -headache -loss of appetite -nausea, vomiting -skin rash, itching -stomach pain -water retention -weak or tired This list may not describe all possible side effects. Call your doctor for medical advice about side effects. You may report side effects to FDA at 1-800-FDA-1088. Where should I keep my medicine? This drug is given in a hospital or clinic and will not be stored at home. NOTE: This sheet is a summary. It may not cover all possible information. If you have questions about this medicine, talk to your doctor, pharmacist, or health care provider.    2016, [employer]vier/[employer] Standard. (2014-12-14 12:56:02)

## 2016-04-12 ENCOUNTER — Ambulatory Visit (HOSPITAL_BASED_OUTPATIENT_CLINIC_OR_DEPARTMENT_OTHER): Payer: Medicare Other

## 2016-04-12 VITALS — BP 129/50 | HR 87 | Temp 98.3°F | Resp 17

## 2016-04-12 DIAGNOSIS — C9300 Acute monoblastic/monocytic leukemia, not having achieved remission: Secondary | ICD-10-CM

## 2016-04-12 DIAGNOSIS — Z5111 Encounter for antineoplastic chemotherapy: Secondary | ICD-10-CM | POA: Diagnosis not present

## 2016-04-12 MED ORDER — SODIUM CHLORIDE 0.9 % IV SOLN
16.0000 mg/m2 | Freq: Once | INTRAVENOUS | Status: AC
  Administered 2016-04-12: 25 mg via INTRAVENOUS
  Filled 2016-04-12: qty 5

## 2016-04-12 MED ORDER — SODIUM CHLORIDE 0.9% FLUSH
10.0000 mL | INTRAVENOUS | Status: DC | PRN
  Administered 2016-04-12: 10 mL
  Filled 2016-04-12: qty 10

## 2016-04-12 MED ORDER — HEPARIN SOD (PORK) LOCK FLUSH 100 UNIT/ML IV SOLN
500.0000 [IU] | Freq: Once | INTRAVENOUS | Status: AC | PRN
  Administered 2016-04-12: 500 [IU]
  Filled 2016-04-12: qty 5

## 2016-04-12 MED ORDER — PROCHLORPERAZINE MALEATE 10 MG PO TABS
ORAL_TABLET | ORAL | Status: AC
  Filled 2016-04-12: qty 1

## 2016-04-12 MED ORDER — SODIUM CHLORIDE 0.9 % IV SOLN
Freq: Once | INTRAVENOUS | Status: AC
  Administered 2016-04-12: 11:00:00 via INTRAVENOUS

## 2016-04-12 MED ORDER — PROCHLORPERAZINE MALEATE 10 MG PO TABS
10.0000 mg | ORAL_TABLET | Freq: Once | ORAL | Status: AC
  Administered 2016-04-12: 10 mg via ORAL

## 2016-04-12 NOTE — Patient Instructions (Signed)
[facility] Discharge Instructions for Patients Receiving Chemotherapy  Today you received the following chemotherapy agents Dacogen.  To help prevent nausea and vomiting after your treatment, we encourage you to take your nausea medication.   If you develop nausea and vomiting that is not controlled by your nausea medication, call the clinic.   BELOW ARE SYMPTOMS THAT SHOULD BE REPORTED IMMEDIATELY:  *FEVER GREATER THAN 100.5 F  *CHILLS WITH OR WITHOUT FEVER  NAUSEA AND VOMITING THAT IS NOT CONTROLLED WITH YOUR NAUSEA MEDICATION  *UNUSUAL SHORTNESS OF BREATH  *UNUSUAL BRUISING OR BLEEDING  TENDERNESS IN MOUTH AND THROAT WITH OR WITHOUT PRESENCE OF ULCERS  *URINARY PROBLEMS  *BOWEL PROBLEMS  UNUSUAL RASH Items with * indicate a potential emergency and should be followed up as soon as possible.  Feel free to call the clinic you have any questions or concerns. The clinic phone number is [phone].  Please show the CHEMO ALERT CARD at check-in to the Emergency Department and triage nurse.  

## 2016-04-13 ENCOUNTER — Ambulatory Visit (HOSPITAL_BASED_OUTPATIENT_CLINIC_OR_DEPARTMENT_OTHER): Payer: Medicare Other

## 2016-04-13 VITALS — BP 113/70 | HR 78 | Temp 97.8°F | Resp 17

## 2016-04-13 DIAGNOSIS — C9300 Acute monoblastic/monocytic leukemia, not having achieved remission: Secondary | ICD-10-CM | POA: Diagnosis not present

## 2016-04-13 DIAGNOSIS — Z5111 Encounter for antineoplastic chemotherapy: Secondary | ICD-10-CM

## 2016-04-13 MED ORDER — HEPARIN SOD (PORK) LOCK FLUSH 100 UNIT/ML IV SOLN
500.0000 [IU] | Freq: Once | INTRAVENOUS | Status: AC | PRN
  Administered 2016-04-13: 500 [IU]
  Filled 2016-04-13: qty 5

## 2016-04-13 MED ORDER — SODIUM CHLORIDE 0.9 % IV SOLN
Freq: Once | INTRAVENOUS | Status: AC
  Administered 2016-04-13: 12:00:00 via INTRAVENOUS

## 2016-04-13 MED ORDER — SODIUM CHLORIDE 0.9 % IV SOLN
16.0000 mg/m2 | Freq: Once | INTRAVENOUS | Status: AC
  Administered 2016-04-13: 25 mg via INTRAVENOUS
  Filled 2016-04-13: qty 5

## 2016-04-13 MED ORDER — SODIUM CHLORIDE 0.9% FLUSH
10.0000 mL | INTRAVENOUS | Status: DC | PRN
  Administered 2016-04-13: 10 mL
  Filled 2016-04-13: qty 10

## 2016-04-13 MED ORDER — PROCHLORPERAZINE MALEATE 10 MG PO TABS
ORAL_TABLET | ORAL | Status: AC
  Filled 2016-04-13: qty 1

## 2016-04-13 MED ORDER — PROCHLORPERAZINE MALEATE 10 MG PO TABS
10.0000 mg | ORAL_TABLET | Freq: Once | ORAL | Status: AC
  Administered 2016-04-13: 10 mg via ORAL

## 2016-04-16 ENCOUNTER — Ambulatory Visit (HOSPITAL_BASED_OUTPATIENT_CLINIC_OR_DEPARTMENT_OTHER): Payer: Medicare Other

## 2016-04-16 ENCOUNTER — Ambulatory Visit: Payer: Medicare Other

## 2016-04-16 ENCOUNTER — Other Ambulatory Visit (HOSPITAL_BASED_OUTPATIENT_CLINIC_OR_DEPARTMENT_OTHER): Payer: Medicare Other

## 2016-04-16 VITALS — BP 110/90 | HR 106 | Temp 98.1°F | Resp 18

## 2016-04-16 VITALS — HR 76

## 2016-04-16 DIAGNOSIS — C9300 Acute monoblastic/monocytic leukemia, not having achieved remission: Secondary | ICD-10-CM

## 2016-04-16 DIAGNOSIS — Z5111 Encounter for antineoplastic chemotherapy: Secondary | ICD-10-CM | POA: Diagnosis not present

## 2016-04-16 LAB — CBC WITH DIFFERENTIAL (CANCER CENTER ONLY)
BASO#: 0.1 10*3/uL (ref 0.0–0.2)
BASO%: 4.9 % — AB (ref 0.0–2.0)
EOS%: 1.1 % (ref 0.0–7.0)
Eosinophils Absolute: 0 10*3/uL (ref 0.0–0.5)
HCT: 26.5 % — ABNORMAL LOW (ref 34.8–46.6)
HGB: 8.9 g/dL — ABNORMAL LOW (ref 11.6–15.9)
LYMPH#: 1 10*3/uL (ref 0.9–3.3)
LYMPH%: 55.2 % — AB (ref 14.0–48.0)
MCH: 33.2 pg (ref 26.0–34.0)
MCHC: 33.6 g/dL (ref 32.0–36.0)
MCV: 99 fL (ref 81–101)
MONO#: 0 10*3/uL — ABNORMAL LOW (ref 0.1–0.9)
MONO%: 0.5 % (ref 0.0–13.0)
NEUT%: 38.3 % — ABNORMAL LOW (ref 39.6–80.0)
NEUTROS ABS: 0.7 10*3/uL — AB (ref 1.5–6.5)
PLATELETS: 263 10*3/uL (ref 145–400)
RBC: 2.68 10*6/uL — AB (ref 3.70–5.32)
RDW: 18.7 % — ABNORMAL HIGH (ref 11.1–15.7)
WBC: 1.8 10*3/uL — AB (ref 3.9–10.0)

## 2016-04-16 LAB — CMP (CANCER CENTER ONLY)
ALBUMIN: 3.7 g/dL (ref 3.3–5.5)
ALT: 25 U/L (ref 10–47)
AST: 34 U/L (ref 11–38)
Alkaline Phosphatase: 88 U/L — ABNORMAL HIGH (ref 26–84)
BILIRUBIN TOTAL: 1 mg/dL (ref 0.20–1.60)
BUN, Bld: 12 mg/dL (ref 7–22)
CO2: 28 mEq/L (ref 18–33)
CREATININE: 0.8 mg/dL (ref 0.6–1.2)
Calcium: 9.3 mg/dL (ref 8.0–10.3)
Chloride: 102 mEq/L (ref 98–108)
Glucose, Bld: 130 mg/dL — ABNORMAL HIGH (ref 73–118)
Potassium: 3.5 mEq/L (ref 3.3–4.7)
SODIUM: 140 meq/L (ref 128–145)
TOTAL PROTEIN: 6.5 g/dL (ref 6.4–8.1)

## 2016-04-16 MED ORDER — PROCHLORPERAZINE MALEATE 10 MG PO TABS
10.0000 mg | ORAL_TABLET | Freq: Once | ORAL | Status: AC
  Administered 2016-04-16: 10 mg via ORAL

## 2016-04-16 MED ORDER — HEPARIN SOD (PORK) LOCK FLUSH 100 UNIT/ML IV SOLN
500.0000 [IU] | Freq: Once | INTRAVENOUS | Status: AC | PRN
  Administered 2016-04-16: 500 [IU]
  Filled 2016-04-16: qty 5

## 2016-04-16 MED ORDER — SODIUM CHLORIDE 0.9 % IV SOLN
16.0000 mg/m2 | Freq: Once | INTRAVENOUS | Status: AC
  Administered 2016-04-16: 25 mg via INTRAVENOUS
  Filled 2016-04-16: qty 5

## 2016-04-16 MED ORDER — PROCHLORPERAZINE MALEATE 10 MG PO TABS
ORAL_TABLET | ORAL | Status: AC
  Filled 2016-04-16: qty 1

## 2016-04-16 MED ORDER — SODIUM CHLORIDE 0.9% FLUSH
10.0000 mL | INTRAVENOUS | Status: DC | PRN
  Administered 2016-04-16: 10 mL
  Filled 2016-04-16: qty 10

## 2016-04-16 MED ORDER — SODIUM CHLORIDE 0.9 % IV SOLN
Freq: Once | INTRAVENOUS | Status: AC
  Administered 2016-04-16: 12:00:00 via INTRAVENOUS

## 2016-04-16 NOTE — Patient Instructions (Signed)
Decitabine injection for infusion What is this medicine? DECITABINE (dee SYE ta been) is a chemotherapy drug. This medicine reduces the growth of cancer cells. It is used to treat adults with myelodysplastic syndromes. This medicine may be used for other purposes; ask your health care provider or pharmacist if you have questions. What should I tell my health care provider before I take this medicine? They need to know if you have any of these conditions: -infection (especially a virus infection such as chickenpox, cold sores, or herpes) -kidney disease -liver disease -an unusual or allergic reaction to decitabine, other medicines, foods, dyes, or preservatives -pregnant or trying to get pregnant -breast-feeding How should I use this medicine? This medicine is for infusion into a vein. It is administered in a hospital or clinic by a doctor or health care professional. Talk to your pediatrician regarding the use of this medicine in children. Special care may be needed. Overdosage: If you think you have taken too much of this medicine contact a poison control center or emergency room at once. NOTE: This medicine is only for you. Do not share this medicine with others. What if I miss a dose? It is important not to miss your dose. Call your doctor or health care professional if you are unable to keep an appointment. What may interact with this medicine? -vaccines Talk to your doctor or health care professional before taking any of these medicines: -aspirin -acetaminophen -ibuprofen -ketoprofen -naproxen This list may not describe all possible interactions. Give your health care provider a list of all the medicines, herbs, non-prescription drugs, or dietary supplements you use. Also tell them if you smoke, drink alcohol, or use illegal drugs. Some items may interact with your medicine. What should I watch for while using this medicine? Visit your doctor for checks on your progress. This drug  may make you feel generally unwell. This is not uncommon, as chemotherapy can affect healthy cells as well as cancer cells. Report any side effects. Continue your course of treatment even though you feel ill unless your doctor tells you to stop. In some cases, you may be given additional medicines to help with side effects. Follow all directions for their use. Call your doctor or health care professional for advice if you get a fever, chills or sore throat, or other symptoms of a cold or flu. Do not treat yourself. This drug decreases your body's ability to fight infections. Try to avoid being around people who are sick. This medicine may increase your risk to bruise or bleed. Call your doctor or health care professional if you notice any unusual bleeding. Do not become pregnant while taking this medicine or for at least 1 month after stopping it. Women should inform their doctor if they wish to become pregnant or think they might be pregnant. Men should not father a child while taking this medicine and for at least 2 months after stopping it. There is a potential for serious side effects to an unborn child. Talk to your health care professional or pharmacist for more information. Do not breast-feed an infant while taking this medicine. What side effects may I notice from receiving this medicine? Side effects that you should report to your doctor or health care professional as soon as possible: -low blood counts - this medicine may decrease the number of white blood cells, red blood cells and platelets. You may be at increased risk for infections and bleeding. -signs of infection - fever or chills, cough, sore throat,  pain or difficulty passing urine -signs of decreased platelets or bleeding - bruising, pinpoint red spots on the skin, black, tarry stools, blood in the urine -signs of decreased red blood cells - unusual weakness or tiredness, fainting spells, lightheadedness -increased blood sugar Side  effects that usually do not require medical attention (report to your prescriber or health care professional if they continue or are bothersome): -constipation -diarrhea -headache -loss of appetite -nausea, vomiting -skin rash, itching -stomach pain -water retention -weak or tired This list may not describe all possible side effects. Call your doctor for medical advice about side effects. You may report side effects to FDA at 1-800-FDA-1088. Where should I keep my medicine? This drug is given in a hospital or clinic and will not be stored at home. NOTE: This sheet is a summary. It may not cover all possible information. If you have questions about this medicine, talk to your doctor, pharmacist, or health care provider.    2016, [employer]vier/[employer] Standard. (2014-12-14 12:56:02)

## 2016-04-16 NOTE — Progress Notes (Signed)
Ok to treat with ANC 0.7 today per Dr. Marin Olp after he reviewed patient's labs.

## 2016-04-17 ENCOUNTER — Ambulatory Visit (HOSPITAL_BASED_OUTPATIENT_CLINIC_OR_DEPARTMENT_OTHER): Payer: Medicare Other

## 2016-04-17 VITALS — BP 107/56 | HR 77 | Temp 98.1°F | Resp 17

## 2016-04-17 DIAGNOSIS — Z5111 Encounter for antineoplastic chemotherapy: Secondary | ICD-10-CM | POA: Diagnosis not present

## 2016-04-17 DIAGNOSIS — C9300 Acute monoblastic/monocytic leukemia, not having achieved remission: Secondary | ICD-10-CM | POA: Diagnosis not present

## 2016-04-17 MED ORDER — PROCHLORPERAZINE MALEATE 10 MG PO TABS
10.0000 mg | ORAL_TABLET | Freq: Once | ORAL | Status: AC
  Administered 2016-04-17: 10 mg via ORAL

## 2016-04-17 MED ORDER — SODIUM CHLORIDE 0.9 % IV SOLN
16.0000 mg/m2 | Freq: Once | INTRAVENOUS | Status: AC
  Administered 2016-04-17: 25 mg via INTRAVENOUS
  Filled 2016-04-17: qty 5

## 2016-04-17 MED ORDER — PROCHLORPERAZINE MALEATE 10 MG PO TABS
ORAL_TABLET | ORAL | Status: AC
  Filled 2016-04-17: qty 1

## 2016-04-17 MED ORDER — SODIUM CHLORIDE 0.9 % IV SOLN
Freq: Once | INTRAVENOUS | Status: AC
  Administered 2016-04-17: 12:00:00 via INTRAVENOUS

## 2016-04-17 MED ORDER — SODIUM CHLORIDE 0.9% FLUSH
10.0000 mL | INTRAVENOUS | Status: DC | PRN
  Administered 2016-04-17: 10 mL
  Filled 2016-04-17: qty 10

## 2016-04-17 MED ORDER — HEPARIN SOD (PORK) LOCK FLUSH 100 UNIT/ML IV SOLN
500.0000 [IU] | Freq: Once | INTRAVENOUS | Status: AC | PRN
Start: 2016-04-17 — End: 2016-04-17
  Administered 2016-04-17: 500 [IU]
  Filled 2016-04-17: qty 5

## 2016-04-17 NOTE — Patient Instructions (Signed)
[facility] Discharge Instructions for Patients Receiving Chemotherapy  Today you received the following chemotherapy agents Dacogen.  To help prevent nausea and vomiting after your treatment, we encourage you to take your nausea medication.   If you develop nausea and vomiting that is not controlled by your nausea medication, call the clinic.   BELOW ARE SYMPTOMS THAT SHOULD BE REPORTED IMMEDIATELY:  *FEVER GREATER THAN 100.5 F  *CHILLS WITH OR WITHOUT FEVER  NAUSEA AND VOMITING THAT IS NOT CONTROLLED WITH YOUR NAUSEA MEDICATION  *UNUSUAL SHORTNESS OF BREATH  *UNUSUAL BRUISING OR BLEEDING  TENDERNESS IN MOUTH AND THROAT WITH OR WITHOUT PRESENCE OF ULCERS  *URINARY PROBLEMS  *BOWEL PROBLEMS  UNUSUAL RASH Items with * indicate a potential emergency and should be followed up as soon as possible.  Feel free to call the clinic you have any questions or concerns. The clinic phone number is [phone].  Please show the CHEMO ALERT CARD at check-in to the Emergency Department and triage nurse.  

## 2016-04-18 ENCOUNTER — Other Ambulatory Visit: Payer: Medicare Other

## 2016-04-23 ENCOUNTER — Inpatient Hospital Stay (HOSPITAL_COMMUNITY)
Admission: EM | Admit: 2016-04-23 | Discharge: 2016-05-16 | DRG: 853 | Disposition: A | Discharge status: Home Health | Payer: Medicare Other | Attending: Nephrology | Admitting: Nephrology

## 2016-04-23 ENCOUNTER — Emergency Department (HOSPITAL_COMMUNITY): Payer: Medicare Other

## 2016-04-23 ENCOUNTER — Other Ambulatory Visit: Payer: Medicare Other

## 2016-04-23 ENCOUNTER — Encounter (HOSPITAL_COMMUNITY): Payer: Self-pay | Admitting: Emergency Medicine

## 2016-04-23 ENCOUNTER — Ambulatory Visit: Payer: Medicare Other | Admitting: Hematology & Oncology

## 2016-04-23 DIAGNOSIS — Z452 Encounter for adjustment and management of vascular access device: Secondary | ICD-10-CM

## 2016-04-23 DIAGNOSIS — D6959 Other secondary thrombocytopenia: Secondary | ICD-10-CM | POA: Diagnosis present

## 2016-04-23 DIAGNOSIS — R6521 Severe sepsis with septic shock: Secondary | ICD-10-CM | POA: Diagnosis present

## 2016-04-23 DIAGNOSIS — L03113 Cellulitis of right upper limb: Secondary | ICD-10-CM | POA: Diagnosis present

## 2016-04-23 DIAGNOSIS — J9601 Acute respiratory failure with hypoxia: Secondary | ICD-10-CM | POA: Diagnosis not present

## 2016-04-23 DIAGNOSIS — Z806 Family history of leukemia: Secondary | ICD-10-CM | POA: Diagnosis not present

## 2016-04-23 DIAGNOSIS — D709 Neutropenia, unspecified: Secondary | ICD-10-CM | POA: Diagnosis not present

## 2016-04-23 DIAGNOSIS — Z818 Family history of other mental and behavioral disorders: Secondary | ICD-10-CM | POA: Diagnosis not present

## 2016-04-23 DIAGNOSIS — J90 Pleural effusion, not elsewhere classified: Secondary | ICD-10-CM | POA: Diagnosis not present

## 2016-04-23 DIAGNOSIS — T859XXA Unspecified complication of internal prosthetic device, implant and graft, initial encounter: Secondary | ICD-10-CM

## 2016-04-23 DIAGNOSIS — Z87891 Personal history of nicotine dependence: Secondary | ICD-10-CM

## 2016-04-23 DIAGNOSIS — E860 Dehydration: Secondary | ICD-10-CM | POA: Diagnosis present

## 2016-04-23 DIAGNOSIS — J189 Pneumonia, unspecified organism: Secondary | ICD-10-CM | POA: Diagnosis present

## 2016-04-23 DIAGNOSIS — G8929 Other chronic pain: Secondary | ICD-10-CM | POA: Diagnosis present

## 2016-04-23 DIAGNOSIS — Z79899 Other long term (current) drug therapy: Secondary | ICD-10-CM

## 2016-04-23 DIAGNOSIS — R739 Hyperglycemia, unspecified: Secondary | ICD-10-CM | POA: Diagnosis not present

## 2016-04-23 DIAGNOSIS — E876 Hypokalemia: Secondary | ICD-10-CM | POA: Diagnosis present

## 2016-04-23 DIAGNOSIS — T451X5A Adverse effect of antineoplastic and immunosuppressive drugs, initial encounter: Secondary | ICD-10-CM | POA: Diagnosis present

## 2016-04-23 DIAGNOSIS — Z66 Do not resuscitate: Secondary | ICD-10-CM | POA: Diagnosis present

## 2016-04-23 DIAGNOSIS — Z9221 Personal history of antineoplastic chemotherapy: Secondary | ICD-10-CM

## 2016-04-23 DIAGNOSIS — D703 Neutropenia due to infection: Secondary | ICD-10-CM | POA: Diagnosis present

## 2016-04-23 DIAGNOSIS — Z8249 Family history of ischemic heart disease and other diseases of the circulatory system: Secondary | ICD-10-CM | POA: Diagnosis not present

## 2016-04-23 DIAGNOSIS — Z4659 Encounter for fitting and adjustment of other gastrointestinal appliance and device: Secondary | ICD-10-CM

## 2016-04-23 DIAGNOSIS — Z7982 Long term (current) use of aspirin: Secondary | ICD-10-CM | POA: Diagnosis not present

## 2016-04-23 DIAGNOSIS — E871 Hypo-osmolality and hyponatremia: Secondary | ICD-10-CM | POA: Diagnosis not present

## 2016-04-23 DIAGNOSIS — C9300 Acute monoblastic/monocytic leukemia, not having achieved remission: Secondary | ICD-10-CM | POA: Diagnosis present

## 2016-04-23 DIAGNOSIS — D61818 Other pancytopenia: Secondary | ICD-10-CM | POA: Diagnosis present

## 2016-04-23 DIAGNOSIS — Y95 Nosocomial condition: Secondary | ICD-10-CM | POA: Diagnosis present

## 2016-04-23 DIAGNOSIS — J9690 Respiratory failure, unspecified, unspecified whether with hypoxia or hypercapnia: Secondary | ICD-10-CM

## 2016-04-23 DIAGNOSIS — I509 Heart failure, unspecified: Secondary | ICD-10-CM

## 2016-04-23 DIAGNOSIS — C9200 Acute myeloblastic leukemia, not having achieved remission: Secondary | ICD-10-CM | POA: Diagnosis not present

## 2016-04-23 DIAGNOSIS — I447 Left bundle-branch block, unspecified: Secondary | ICD-10-CM | POA: Diagnosis present

## 2016-04-23 DIAGNOSIS — R0600 Dyspnea, unspecified: Secondary | ICD-10-CM | POA: Diagnosis not present

## 2016-04-23 DIAGNOSIS — A419 Sepsis, unspecified organism: Principal | ICD-10-CM | POA: Diagnosis present

## 2016-04-23 DIAGNOSIS — I248 Other forms of acute ischemic heart disease: Secondary | ICD-10-CM | POA: Diagnosis present

## 2016-04-23 DIAGNOSIS — Z515 Encounter for palliative care: Secondary | ICD-10-CM | POA: Diagnosis not present

## 2016-04-23 DIAGNOSIS — D72819 Decreased white blood cell count, unspecified: Secondary | ICD-10-CM | POA: Diagnosis not present

## 2016-04-23 DIAGNOSIS — Z9889 Other specified postprocedural states: Secondary | ICD-10-CM | POA: Diagnosis not present

## 2016-04-23 DIAGNOSIS — J181 Lobar pneumonia, unspecified organism: Secondary | ICD-10-CM | POA: Diagnosis present

## 2016-04-23 DIAGNOSIS — Z978 Presence of other specified devices: Secondary | ICD-10-CM

## 2016-04-23 DIAGNOSIS — R079 Chest pain, unspecified: Secondary | ICD-10-CM

## 2016-04-23 DIAGNOSIS — N179 Acute kidney failure, unspecified: Secondary | ICD-10-CM | POA: Diagnosis not present

## 2016-04-23 DIAGNOSIS — I5043 Acute on chronic combined systolic (congestive) and diastolic (congestive) heart failure: Secondary | ICD-10-CM | POA: Diagnosis present

## 2016-04-23 DIAGNOSIS — D61810 Antineoplastic chemotherapy induced pancytopenia: Secondary | ICD-10-CM | POA: Diagnosis present

## 2016-04-23 DIAGNOSIS — F329 Major depressive disorder, single episode, unspecified: Secondary | ICD-10-CM | POA: Diagnosis present

## 2016-04-23 DIAGNOSIS — G9340 Encephalopathy, unspecified: Secondary | ICD-10-CM | POA: Diagnosis present

## 2016-04-23 DIAGNOSIS — R6520 Severe sepsis without septic shock: Secondary | ICD-10-CM

## 2016-04-23 DIAGNOSIS — R069 Unspecified abnormalities of breathing: Secondary | ICD-10-CM

## 2016-04-23 DIAGNOSIS — G4700 Insomnia, unspecified: Secondary | ICD-10-CM | POA: Diagnosis not present

## 2016-04-23 DIAGNOSIS — F419 Anxiety disorder, unspecified: Secondary | ICD-10-CM | POA: Diagnosis present

## 2016-04-23 DIAGNOSIS — D649 Anemia, unspecified: Secondary | ICD-10-CM | POA: Diagnosis not present

## 2016-04-23 DIAGNOSIS — I110 Hypertensive heart disease with heart failure: Secondary | ICD-10-CM | POA: Diagnosis present

## 2016-04-23 DIAGNOSIS — C9301 Acute monoblastic/monocytic leukemia, in remission: Secondary | ICD-10-CM | POA: Diagnosis not present

## 2016-04-23 DIAGNOSIS — R5081 Fever presenting with conditions classified elsewhere: Secondary | ICD-10-CM | POA: Diagnosis present

## 2016-04-23 DIAGNOSIS — I5021 Acute systolic (congestive) heart failure: Secondary | ICD-10-CM | POA: Diagnosis not present

## 2016-04-23 HISTORY — DX: Acute systolic (congestive) heart failure: I50.21

## 2016-04-23 LAB — I-STAT CG4 LACTIC ACID, ED: Lactic Acid, Venous: 0.82 mmol/L (ref 0.5–1.9)

## 2016-04-23 LAB — CBC WITH DIFFERENTIAL/PLATELET
BASOS ABS: 0 10*3/uL (ref 0.0–0.1)
Basophils Relative: 0 %
EOS ABS: 0 10*3/uL (ref 0.0–0.7)
Eosinophils Relative: 0 %
HCT: 19.7 % — ABNORMAL LOW (ref 36.0–46.0)
Hemoglobin: 6.7 g/dL — CL (ref 12.0–15.0)
Lymphocytes Relative: 16 %
Lymphs Abs: 0.3 10*3/uL — ABNORMAL LOW (ref 0.7–4.0)
MCH: 33 pg (ref 26.0–34.0)
MCHC: 34 g/dL (ref 30.0–36.0)
MCV: 97 fL (ref 78.0–100.0)
MONO ABS: 0 10*3/uL — AB (ref 0.1–1.0)
Monocytes Relative: 1 %
NEUTROS PCT: 83 %
Neutro Abs: 1.8 10*3/uL (ref 1.7–7.7)
PLATELETS: 28 10*3/uL — AB (ref 150–400)
RBC: 2.03 MIL/uL — AB (ref 3.87–5.11)
RDW: 19.1 % — ABNORMAL HIGH (ref 11.5–15.5)
WBC: 2.1 10*3/uL — AB (ref 4.0–10.5)

## 2016-04-23 LAB — COMPREHENSIVE METABOLIC PANEL
ALT: 17 U/L (ref 14–54)
AST: 19 U/L (ref 15–41)
Albumin: 3.7 g/dL (ref 3.5–5.0)
Alkaline Phosphatase: 74 U/L (ref 38–126)
Anion gap: 10 (ref 5–15)
BUN: 24 mg/dL — ABNORMAL HIGH (ref 6–20)
CHLORIDE: 100 mmol/L — AB (ref 101–111)
CO2: 26 mmol/L (ref 22–32)
CREATININE: 1.03 mg/dL — AB (ref 0.44–1.00)
Calcium: 8 mg/dL — ABNORMAL LOW (ref 8.9–10.3)
GFR calc non Af Amer: 51 mL/min — ABNORMAL LOW (ref >60)
GFR, EST AFRICAN AMERICAN: 59 mL/min — AB (ref >60)
Glucose, Bld: 157 mg/dL — ABNORMAL HIGH (ref 65–99)
Potassium: 3.1 mmol/L — ABNORMAL LOW (ref 3.5–5.1)
SODIUM: 136 mmol/L (ref 135–145)
Total Bilirubin: 1.4 mg/dL — ABNORMAL HIGH (ref 0.3–1.2)
Total Protein: 6.7 g/dL (ref 6.5–8.1)

## 2016-04-23 LAB — URINALYSIS, ROUTINE W REFLEX MICROSCOPIC
Bilirubin Urine: NEGATIVE
Glucose, UA: NEGATIVE mg/dL
HGB URINE DIPSTICK: NEGATIVE
Ketones, ur: NEGATIVE mg/dL
Leukocytes, UA: NEGATIVE
Nitrite: NEGATIVE
Protein, ur: NEGATIVE mg/dL
SPECIFIC GRAVITY, URINE: 1.019 (ref 1.005–1.030)
pH: 5 (ref 5.0–8.0)

## 2016-04-23 LAB — PREPARE RBC (CROSSMATCH)

## 2016-04-23 LAB — MAGNESIUM: Magnesium: 2 mg/dL (ref 1.7–2.4)

## 2016-04-23 LAB — STREP PNEUMONIAE URINARY ANTIGEN: Strep Pneumo Urinary Antigen: NEGATIVE

## 2016-04-23 MED ORDER — ASPIRIN EC 81 MG PO TBEC
81.0000 mg | DELAYED_RELEASE_TABLET | Freq: Every day | ORAL | Status: DC
  Administered 2016-04-23 – 2016-04-24 (×2): 81 mg via ORAL
  Filled 2016-04-23 (×2): qty 1

## 2016-04-23 MED ORDER — FUROSEMIDE 10 MG/ML IJ SOLN
10.0000 mg | Freq: Once | INTRAMUSCULAR | Status: AC
  Administered 2016-04-23: 10 mg via INTRAVENOUS
  Filled 2016-04-23: qty 1

## 2016-04-23 MED ORDER — ONDANSETRON HCL 4 MG/2ML IJ SOLN: 4.0000 mg | Freq: Four times a day (QID) | INTRAMUSCULAR | Status: DC | PRN

## 2016-04-23 MED ORDER — ALPRAZOLAM 0.5 MG PO TABS
0.5000 mg | ORAL_TABLET | Freq: Three times a day (TID) | ORAL | Status: DC | PRN
  Administered 2016-04-24 (×2): 0.5 mg via ORAL
  Filled 2016-04-23 (×2): qty 1

## 2016-04-23 MED ORDER — SODIUM CHLORIDE 0.9 % IV SOLN
INTRAVENOUS | Status: AC
  Administered 2016-04-23: 21:00:00 via INTRAVENOUS
  Administered 2016-04-23: 125 mL/h via INTRAVENOUS
  Administered 2016-04-24: 05:00:00 via INTRAVENOUS

## 2016-04-23 MED ORDER — ONDANSETRON HCL 4 MG PO TABS: 4.0000 mg | ORAL_TABLET | Freq: Four times a day (QID) | ORAL | Status: DC | PRN

## 2016-04-23 MED ORDER — SODIUM CHLORIDE 0.9 % IV SOLN
INTRAVENOUS | Status: DC
  Administered 2016-04-23: 08:00:00 via INTRAVENOUS

## 2016-04-23 MED ORDER — DIPHENHYDRAMINE HCL 25 MG PO CAPS
25.0000 mg | ORAL_CAPSULE | Freq: Three times a day (TID) | ORAL | Status: DC | PRN
  Administered 2016-04-23: 25 mg via ORAL
  Filled 2016-04-23: qty 1

## 2016-04-23 MED ORDER — POTASSIUM CHLORIDE CRYS ER 20 MEQ PO TBCR
40.0000 meq | EXTENDED_RELEASE_TABLET | Freq: Every day | ORAL | Status: DC
  Administered 2016-04-23 – 2016-04-24 (×2): 40 meq via ORAL
  Filled 2016-04-23 (×2): qty 2

## 2016-04-23 MED ORDER — DEXTROSE 5 % IV SOLN
1.0000 g | Freq: Three times a day (TID) | INTRAVENOUS | Status: DC
  Administered 2016-04-23 – 2016-04-24 (×3): 1 g via INTRAVENOUS
  Filled 2016-04-23 (×4): qty 1

## 2016-04-23 MED ORDER — DEXTROSE 5 % IV SOLN
500.0000 mg | Freq: Once | INTRAVENOUS | Status: AC
  Administered 2016-04-23: 500 mg via INTRAVENOUS
  Filled 2016-04-23: qty 500

## 2016-04-23 MED ORDER — ATORVASTATIN CALCIUM 10 MG PO TABS
10.0000 mg | ORAL_TABLET | Freq: Every day | ORAL | Status: DC
  Administered 2016-04-23 – 2016-04-25 (×3): 10 mg via ORAL
  Filled 2016-04-23 (×3): qty 1

## 2016-04-23 MED ORDER — DEXTROSE 5 % IV SOLN
2.0000 g | Freq: Once | INTRAVENOUS | Status: AC
  Administered 2016-04-23: 2 g via INTRAVENOUS
  Filled 2016-04-23: qty 2

## 2016-04-23 MED ORDER — DEXTROSE 5 % IV SOLN
500.0000 mg | INTRAVENOUS | Status: DC
  Administered 2016-04-24 – 2016-04-26 (×3): 500 mg via INTRAVENOUS
  Filled 2016-04-23 (×3): qty 500

## 2016-04-23 MED ORDER — SODIUM CHLORIDE 0.9 % IV SOLN
Freq: Once | INTRAVENOUS | Status: AC
  Administered 2016-04-23: 09:00:00 via INTRAVENOUS

## 2016-04-23 MED ORDER — PAROXETINE HCL 20 MG PO TABS
20.0000 mg | ORAL_TABLET | Freq: Every day | ORAL | Status: DC
  Administered 2016-04-23 – 2016-04-24 (×2): 20 mg via ORAL
  Filled 2016-04-23 (×3): qty 1

## 2016-04-23 MED ORDER — ACETAMINOPHEN 325 MG PO TABS
650.0000 mg | ORAL_TABLET | Freq: Four times a day (QID) | ORAL | Status: DC | PRN
  Administered 2016-04-23 – 2016-05-04 (×23): 650 mg via ORAL
  Filled 2016-04-23 (×23): qty 2

## 2016-04-23 MED ORDER — SODIUM CHLORIDE 0.9 % IV SOLN: Freq: Once | INTRAVENOUS | Status: AC

## 2016-04-23 MED ORDER — ACETAMINOPHEN 650 MG RE SUPP
650.0000 mg | Freq: Four times a day (QID) | RECTAL | Status: DC | PRN
  Administered 2016-04-25 – 2016-04-27 (×3): 650 mg via RECTAL
  Filled 2016-04-23 (×3): qty 1

## 2016-04-23 MED ORDER — ORPHENADRINE CITRATE ER 100 MG PO TB12
100.0000 mg | ORAL_TABLET | Freq: Every evening | ORAL | Status: DC | PRN
  Filled 2016-04-23: qty 1

## 2016-04-23 NOTE — Consult Note (Signed)
Referral MD  Reason for Referral: Acute myeloid leukemia. Community-acquired pneumonia   Chief Complaint  Patient presents with  . Fever  . Generalized Body Aches  : I had a fever and was coughing.  HPI: Kristina Meyer is well-known to me. She's very nice 75 year old white female. She has acute monocytic leukemia. She has been on chemotherapy of IV Dacogen. She got her fourth cycle on November 21. So far, she is tolerated treatment well. She is been transfuse a couple times. She's had no problems with bleeding. She's had no nausea or vomiting. She's had no diarrhea.  Over the weekend, she began to have some cough. She had a temperature of 102.5. She came to the emergency room. She had a chest x-ray done. This seemed to show consolidation in the left lower lobe. Her CBC showed was a count of 2.1. Hemoglobin 6.7. Platelet count 28,000. This is not too surprising as she is at the nadir with her chemotherapy. We will need to set her up with a bone marrow test to see how well she is doing.  She has had little bit of a cough. She's had no bleeding. She's had no rashes. She's had no headache.  Her appetite has been okay.    Past Medical History:  Diagnosis Date  . 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 Anxiety   . Heart murmur    echo done 20 yrs ago  normal  . Hypertension   . PONV (postoperative nausea and vomiting)    req patch  :  Past Surgical History:  Procedure Laterality Date  . BREAST CYST EXCISION Left 1970  . BREAST SURGERY Left 76   lump   . EYE SURGERY Bilateral 09   cataracts  . HEMORRHOID SURGERY  85  . IR GENERIC HISTORICAL  01/11/2016   IR US GUIDE VASC ACCESS RIGHT 01/11/2016 Kristina Cadet, MD [facility]INTER[facility]  . IR GENERIC HISTORICAL  01/11/2016   IR FLUORO GUIDE CV LINE RIGHT 01/11/2016 Kristina Cadet, MD [facility]  . LUMBAR LAMINECTOMY/DECOMPRESSION MICRODISCECTOMY Right 12/02/2013   Procedure: L5-S1 RIGHT DISCECTOMY;  Surgeon: Melina Schools, MD;   Location: [facility];  Service: Orthopedics;  Laterality: Right;  :   Current Facility-Administered Medications:  .  0.9 %  sodium chloride infusion, , Intravenous, Once, Kristina Dada, MD .  0.9 %  sodium chloride infusion, , Intravenous, Continuous, Kristina Dada, MD, Last Rate: 100 mL/hr at 04/23/16 0815 .  0.9 %  sodium chloride infusion, , Intravenous, Once, Kristina Napoleon, MD .  acetaminophen (TYLENOL) tablet 650 mg, 650 mg, Oral, Q6H PRN **OR** acetaminophen (TYLENOL) suppository 650 mg, 650 mg, Rectal, Q6H PRN, Kristina Dada, MD .  ALPRAZolam Duanne Moron) tablet 0.5 mg, 0.5 mg, Oral, TID PRN, Kristina Dada, MD .  aspirin EC tablet 81 mg, 81 mg, Oral, Daily, Kristina Dada, MD .  atorvastatin (LIPITOR) tablet 10 mg, 10 mg, Oral, QHS, Kristina Dada, MD .  ceFEPIme (MAXIPIME) 1 g in dextrose 5 % 50 mL IVPB, 1 g, Intravenous, Q8H, Kristina Dada, MD .  furosemide (LASIX) injection 10 mg, 10 mg, Intravenous, Once, Kristina Napoleon, MD .  ondansetron (ZOFRAN) tablet 4 mg, 4 mg, Oral, Q6H PRN **OR** ondansetron (ZOFRAN) injection 4 mg, 4 mg, Intravenous, Q6H PRN, Kristina Dada, MD .  orphenadrine (NORFLEX) 12 hr tablet 100 mg, 100 mg, Oral, QHS PRN, Kristina Dada, MD .  PARoxetine (PAXIL) tablet 20 mg, 20 mg,  Oral, Daily, Kristina Dada, MD .  potassium chloride SA (K-DUR,KLOR-CON) CR tablet 40 mEq, 40 mEq, Oral, Daily, Kristina Dada, MD:  . sodium chloride   Intravenous Once  . sodium chloride   Intravenous Once  . aspirin EC  81 mg Oral Daily  . atorvastatin  10 mg Oral QHS  . ceFEPime (MAXIPIME) IV  1 g Intravenous Q8H  . furosemide  10 mg Intravenous Once  . PARoxetine  20 mg Oral Daily  . potassium chloride  40 mEq Oral Daily  :  Allergies  Allergen Reactions  . Other Other (See Comments)    GENERAL Anesthesia, vomiting  :  Family History  Problem Relation Age of Onset  . Heart failure Mother   .  Depression Mother   . Heart Problems Father   . Leukemia Cousin   :  Social History   Social History  . Marital status: Married    Spouse name: N/A  . Number of children: N/A  . Years of education: N/A   Occupational History  . Not on file.   Social History Main Topics  . Smoking status: Former Smoker    Packs/day: 0.50    Years: 5.00    Types: Cigarettes    Quit date: 12/02/1966  . Smokeless tobacco: Never Used  . Alcohol use No  . Drug use: No  . Sexual activity: Not on file   Other Topics Concern  . Not on file   Social History Narrative  . No narrative on file  :  Pertinent items are noted in HPI.  Exam:As above Patient Vitals for the past 24 hrs:  BP Temp Temp src Pulse Resp SpO2  04/23/16 0625 - 98.9 F (37.2 C) Oral - - -  04/23/16 0500 (!) 95/51 - - 87 16 97 %  04/23/16 0430 (!) 97/50 - - 84 - 94 %  04/23/16 0400 (!) 95/47 - - 89 26 93 %  04/23/16 0300 (!) 100/47 - - 97 - 94 %  04/23/16 0252 - - - - - 100 %  04/23/16 0250 108/55 98.1 F (36.7 C) Oral 98 20 98 %       Recent Labs  04/23/16 0343  WBC 2.1*  HGB 6.7*  HCT 19.7*  PLT 28*    Recent Labs  04/23/16 0343  NA 136  K 3.1*  CL 100*  CO2 26  GLUCOSE 157*  BUN 24*  CREATININE 1.03*  CALCIUM 8.0*    Blood smear review:  None  Pathology: None     Assessment and Plan:  Kristina Meyer is a 75 year old white female with acute monocytic leukemia. Adding that she is responded. Her platelet count has responded very nicely.  She is to be due for a bone marrow test. We probably will have to set this up in a couple of weeks.  She is at her nadir with the like counts. It is not surprising that her platelet count is down and that her hemoglobin is down. She will be transfused 2 units.  We will see what her cultures show. I agree with the antibiotics that she has been given. I gives her pretty broad coverage. If she continues to have fevers, though may have to add an antifungal agent.  Typically however, fungal pneumonias tend to be in the upper lobe.  We will follow her along. I know that she will get fantastic care from all the staff upon 6 E. I very much appreciate everything that is  being done for her.  Lattie Haw, MD  Jeneen Rinks 4:7

## 2016-04-23 NOTE — Progress Notes (Signed)
Attempted to call to give report on pt to be transferred on 4W, secretary stated receiving RN still busy doing bedside reporting. Will attempt again.

## 2016-04-23 NOTE — Progress Notes (Signed)
PROGRESS NOTE    Kristina Meyer  [id number] DOB: 08/12/1940 DOA: 04/23/2016 PCP: Jobe Marker, MD   Brief Narrative: 75 y.o. 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  Assessment & Plan:   # Sepsis due to Left lung base pneumonia in immunocompromised patient: Fever, pna, hypotension. Unable to mount leukocytosis because of chemotherapy. -Continue cefepime and azithromycin IV -Follow-up urine Legionella and strep antigen -Pending culture result -Continue supportive care.  #  Pancytopenia (Kristina Meyer) due to chemotherapy: Receiving 2 units of red blood cell transfusion today. No sign of bleeding. Evaluated by oncologist. Monitor CBC.  #Hypotension: Blood pressure medicine on hold. On IV fluid. Monitor blood pressure.   # Hypokalemia: replete KCL. Mg acceptable. Monitor BMP.   DVT prophylaxis: SCD. No anticoagulation because of thrombocytopenia and increased bleeding risk Code Status: Full code Family Communication: Patient's son at bedside Disposition Plan: Likely discharge home in 2-3 days.  Consultants:   Hematology oncologist  Procedures: None Antimicrobials: Cefepime and azithromycin since November 27  Subjective: Patient was seen and examined at bedside. Reported weakness. Denied nausea vomiting or abdominal pain. Has mild shortness of breath and intermittent chronic chest pain, increased while coughing. Patient's son at bedside. Denied dizziness or lightheadedness.   Objective: Vitals:   04/23/16 1200 04/23/16 1239 04/23/16 1318 04/23/16 1430  BP: (!) 92/40 (!) 93/38 (!) 87/41 (!) 94/40  Pulse: 85 91 92 82  Resp:  20 18 20   Temp: 99.7 F (37.6 C) (!) 101.6 F (38.7 C) 97.9 F (36.6 C) 98.6 F (37 C)  TempSrc: Oral Oral Oral Oral  SpO2: 97% 98% 98% 100%    Intake/Output Summary (Last 24 hours) at 04/23/16 1506 Last data  filed at 04/23/16 1430  Gross per 24 hour  Intake             1078 ml  Output                0 ml  Net             1078 ml   There were no vitals filed for this visit.  Examination:  General exam: elder female looks ill, NAD  Respiratory system: bi-basal crackle, no wheeze Cardiovascular system: S1 & S2 heard, RRR.  No pedal edema. Gastrointestinal system: Abdomen is nondistended, soft and nontender. Normal bowel sounds heard. Central nervous system: Alert and oriented. No focal neurological deficits. Extremities: Symmetric 5 x 5 power. Skin: No rashes, lesions or ulcers Psychiatry: Judgement and insight appear normal. Mood & affect appropriate.     Data Reviewed: I have personally reviewed following labs and imaging studies  CBC:  Recent Labs Lab 04/23/16 0343  WBC 2.1*  NEUTROABS 1.8  HGB 6.7*  HCT 19.7*  MCV 97.0  PLT 28*   Basic Metabolic Panel:  Recent Labs Lab 04/23/16 0343  NA 136  K 3.1*  CL 100*  CO2 26  GLUCOSE 157*  BUN 24*  CREATININE 1.03*  CALCIUM 8.0*  MG 2.0   GFR: CrCl cannot be calculated (Unknown ideal weight.). Liver Function Tests:  Recent Labs Lab 04/23/16 0343  AST 19  ALT 17  ALKPHOS 74  BILITOT 1.4*  PROT 6.7  ALBUMIN 3.7   No results for input(s): LIPASE, AMYLASE in the last 168 hours. No results for input(s): AMMONIA in the last 168 hours. Coagulation Profile: No results for  input(s): INR, PROTIME in the last 168 hours. Cardiac Enzymes: No results for input(s): CKTOTAL, CKMB, CKMBINDEX, TROPONINI in the last 168 hours. BNP (last 3 results) No results for input(s): PROBNP in the last 8760 hours. HbA1C: No results for input(s): HGBA1C in the last 72 hours. CBG: No results for input(s): GLUCAP in the last 168 hours. Lipid Profile: No results for input(s): CHOL, HDL, LDLCALC, TRIG, CHOLHDL, LDLDIRECT in the last 72 hours. Thyroid Function Tests: No results for input(s): TSH, T4TOTAL, FREET4, T3FREE, THYROIDAB in the  last 72 hours. Anemia Panel: No results for input(s): VITAMINB12, FOLATE, FERRITIN, TIBC, IRON, RETICCTPCT in the last 72 hours. Sepsis Labs:  Recent Labs Lab 04/23/16 0358  LATICACIDVEN 0.82    No results found for this or any previous visit (from the past 240 hour(s)).       Radiology Studies: Dg Chest 2 View  Result Date: 04/23/2016 CLINICAL DATA:  Constant generalized body aches for 3 days. Vomiting, appetite change, weakness, fatigue, and fever. Mild cough. EXAM: CHEST  2 VIEW COMPARISON:  None. FINDINGS: Normal heart size and pulmonary vascularity. Consolidation in the left lung base behind the heart likely representing pneumonia. Right lung is clear. Power port type central venous catheter with tip over the low SVC region. No pneumothorax. Degenerative changes in the spine. IMPRESSION: Consolidation in the left lung base likely representing pneumonia. Electronically Signed   By: Lucienne Capers M.D.   On: 04/23/2016 03:28        Scheduled Meds: . aspirin EC  81 mg Oral Daily  . atorvastatin  10 mg Oral QHS  . azithromycin  500 mg Intravenous Q24H  . ceFEPime (MAXIPIME) IV  1 g Intravenous Q8H  . PARoxetine  20 mg Oral Daily  . potassium chloride  40 mEq Oral Daily   Continuous Infusions: . sodium chloride       LOS: 0 days    [place] Tanna Furry, MD [facility] Pager [phone]  If 7PM-7AM, please contact night-coverage [email or web address] Password [email or web address] 04/23/2016, 3:06 PM

## 2016-04-23 NOTE — ED Provider Notes (Signed)
[name] DEPT Provider Note   CSN: [id number] Arrival date & time: 04/23/16  [id number]   By signing my name below, I, Eunice Blase, attest that this documentation has been prepared under the direction and in the presence of Orpah Greek, MD. Electronically signed, Eunice Blase, ED Scribe. 04/23/16. 3:20 AM.   History    The history is provided by the patient and the spouse. No language interpreter was used.   HPI Comments: Kristina Meyer is a 75 y.o. female who presents to the Emergency Department complaining of constant generalized body aches onset 3 days ago. Pt reports associated emesis (1 time today), appetite change, weakness, fatigue, subjective fever (tMax 102.6, but none currently), pallor skin and mild cough. Pt states that she has had 4 chemotherapy treatments, and she was due for another treatment today that she did not attend. Per spouse, she was given 2 aspirin PTA. Pt denies diarrhea and abdominal pain.       Past Medical History:  Diagnosis Date  . 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 Anxiety   . Heart murmur    echo done 20 yrs ago  normal  . Hypertension   . PONV (postoperative nausea and vomiting)    req patch    Patient Active Problem List   Diagnosis Date Noted  . HCAP (healthcare-associated pneumonia) 04/23/2016  . Pancytopenia ([place]) 04/23/2016  . Hypokalemia 04/23/2016  . Dehydration 04/23/2016  . Anxiety disorder 04/04/2016  . Glucose intolerance (impaired glucose tolerance) 04/04/2016  . History of mitral valve prolapse 04/04/2016  . History of osteopenia 04/04/2016  . Hyperlipidemia 04/04/2016  . 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 Leukocytosis 12/21/2015  . Back pain 12/02/2013  . 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 Pseudophakia of both eyes 04/09/2011    Past Surgical History:  Procedure Laterality Date  . BREAST CYST EXCISION Left 1970  . BREAST  SURGERY Left 76   lump   . EYE SURGERY Bilateral 09   cataracts  . HEMORRHOID SURGERY  85  . IR GENERIC HISTORICAL  01/11/2016   IR US GUIDE VASC ACCESS RIGHT 01/11/2016 Jacqulynn Cadet, MD [facility]  . IR GENERIC HISTORICAL  01/11/2016   IR FLUORO GUIDE CV LINE RIGHT 01/11/2016 Jacqulynn Cadet, MD [facility]  . LUMBAR LAMINECTOMY/DECOMPRESSION MICRODISCECTOMY Right 12/02/2013   Procedure: L5-S1 RIGHT DISCECTOMY;  Surgeon: Melina Schools, MD;  Location: [facility];  Service: Orthopedics;  Laterality: Right;    OB History    No data available       Home Medications    Prior to Admission medications   Medication Sig Start Date End Date Taking? Authorizing Provider  ALPRAZolam (XANAX) 0.5 MG tablet TAKE 1 TABLET BY MOUTH 3 TIMES A DAY AS NEEDED Patient taking differently: TAKE 1 TABLET BY MOUTH 3 TIMES A DAY AS NEEDED for anxiety. 03/26/16  Yes Volanda Napoleon, MD  aspirin 81 MG tablet Take 81 mg by mouth daily.    Yes Historical Provider, MD  atorvastatin (LIPITOR) 20 MG tablet Take 10 mg by mouth at bedtime.   Yes Historical Provider, MD  Calcium-Magnesium-Vitamin D (CALCIUM 1200+D3 PO) Take 1 tablet by mouth daily.   Yes Historical Provider, MD  cholecalciferol (VITAMIN D) 1000 UNITS tablet Take 1,000 Units by mouth daily.   Yes Historical Provider, MD  hydrochlorothiazide (HYDRODIURIL) 25 MG tablet Take by mouth. 12/08/16  Yes Historical Provider, MD  lidocaine-prilocaine (EMLA) cream  Apply 1 application topically as needed. Place quarter sized amount of cream directly on portacath and cover with saran wrap at least 1 - 1 1/2 hours prior to procedure. 01/11/16  Yes Volanda Napoleon, MD  Multiple Vitamins-Minerals (MULTIVITAMIN ADULT) TABS Take 1 tablet by mouth daily.   Yes Historical Provider, MD  orphenadrine (NORFLEX) 100 MG tablet Take 1 tablet (100 mg total) by mouth at bedtime as needed for muscle spasms. 12/29/15  Yes Volanda Napoleon, MD  PARoxetine (PAXIL) 20 MG tablet Take 20 mg by  mouth daily.   Yes Historical Provider, MD  potassium chloride SA (K-DUR,KLOR-CON) 20 MEQ tablet Take 1 pill twice a day for 7 days, then 1 pill a day. 02/13/16  Yes Volanda Napoleon, MD  prochlorperazine (COMPAZINE) 5 MG tablet Take 1 tablet (5 mg total) by mouth every 6 (six) hours as needed for nausea or vomiting. 01/05/16  Yes Volanda Napoleon, MD  sulfamethoxazole-trimethoprim (BACTRIM DS) 800-160 MG per tablet Take 1 tablet by mouth daily.   Yes Historical Provider, MD    Family History Family History  Problem Relation Age of Onset  . Heart failure Mother   . Depression Mother   . Heart Problems Father   . Leukemia Cousin     Social History Social History  Substance Use Topics  . Smoking status: Former Smoker    Packs/day: 0.50    Years: 5.00    Types: Cigarettes    Quit date: 12/02/1966  . Smokeless tobacco: Never Used  . Alcohol use No     Allergies   Other   Review of Systems Review of Systems  Constitutional: Positive for appetite change, fatigue and fever.  Respiratory: Positive for cough. Shortness of breath: mild.   Gastrointestinal: Positive for vomiting. Negative for abdominal pain and diarrhea.  Skin: Positive for pallor.  Neurological: Positive for weakness.     Physical Exam Updated Vital Signs BP (!) 95/51   Pulse 87   Temp 98.1 F (36.7 C) (Oral)   Resp 16   SpO2 97%   Physical Exam  Constitutional: She is oriented to person, place, and time. She appears well-developed and well-nourished. No distress.  HENT:  Head: Normocephalic and atraumatic.  Right Ear: Hearing normal.  Left Ear: Hearing normal.  Nose: Nose normal.  Mouth/Throat: Oropharynx is clear and moist and mucous membranes are normal.  Eyes: Conjunctivae and EOM are normal. Pupils are equal, round, and reactive to light.  Neck: Normal range of motion. Neck supple.  Cardiovascular: Regular rhythm, S1 normal and S2 normal.  Exam reveals no gallop and no friction rub.   No murmur  heard. Pulmonary/Chest: Effort normal. No accessory muscle usage. Tachypnea noted. No respiratory distress. She has decreased breath sounds. She exhibits no tenderness.  Abdominal: Soft. Normal appearance and bowel sounds are normal. There is no hepatosplenomegaly. There is no tenderness. There is no rebound, no guarding, no tenderness at McBurney's point and negative Murphy's sign. No hernia.  Musculoskeletal: Normal range of motion.  Neurological: She is alert and oriented to person, place, and time. She has normal strength. No cranial nerve deficit or sensory deficit. Coordination normal. GCS eye subscore is 4. GCS verbal subscore is 5. GCS motor subscore is 6.  Skin: Skin is warm, dry and intact. No rash noted. No cyanosis. There is pallor.  Psychiatric: She has a normal mood and affect. Her speech is normal and behavior is normal. Thought content normal.  Nursing note and vitals reviewed.  ED Treatments / Results  DIAGNOSTIC STUDIES: Oxygen Saturation is 98% on RA, normal by my interpretation.    COORDINATION OF CARE: 3:20 AM Will order labs, blood work and imaging. Discussed treatment plan with pt at bedside and pt agreed to plan.  Labs (all labs ordered are listed, but only abnormal results are displayed) Labs Reviewed  CBC WITH DIFFERENTIAL/PLATELET - Abnormal; Notable for the following:       Result Value   WBC 2.1 (*)    RBC 2.03 (*)    Hemoglobin 6.7 (*)    HCT 19.7 (*)    RDW 19.1 (*)    Platelets 28 (*)    Lymphs Abs 0.3 (*)    Monocytes Absolute 0.0 (*)    All other components within normal limits  COMPREHENSIVE METABOLIC PANEL - Abnormal; Notable for the following:    Potassium 3.1 (*)    Chloride 100 (*)    Glucose, Bld 157 (*)    BUN 24 (*)    Creatinine, Ser 1.03 (*)    Calcium 8.0 (*)    Total Bilirubin 1.4 (*)    GFR calc non Af Amer 51 (*)    GFR calc Af Amer 59 (*)    All other components within normal limits  URINALYSIS, ROUTINE W REFLEX MICROSCOPIC  (NOT AT [facility]) - Abnormal; Notable for the following:    Color, Urine AMBER (*)    All other components within normal limits  URINE CULTURE  CULTURE, BLOOD (ROUTINE X 2)  CULTURE, BLOOD (ROUTINE X 2)  I-STAT CG4 LACTIC ACID, ED  TYPE AND SCREEN    EKG  EKG Interpretation None       Radiology Dg Chest 2 View  Result Date: 04/23/2016 CLINICAL DATA:  Constant generalized body aches for 3 days. Vomiting, appetite change, weakness, fatigue, and fever. Mild cough. EXAM: CHEST  2 VIEW COMPARISON:  None. FINDINGS: Normal heart size and pulmonary vascularity. Consolidation in the left lung base behind the heart likely representing pneumonia. Right lung is clear. Power port type central venous catheter with tip over the low SVC region. No pneumothorax. Degenerative changes in the spine. IMPRESSION: Consolidation in the left lung base likely representing pneumonia. Electronically Signed   By: Lucienne Capers M.D.   On: 04/23/2016 03:28    Procedures Procedures (including critical care time)  Medications Ordered in ED Medications  ceFEPIme (MAXIPIME) 2 g in dextrose 5 % 50 mL IVPB (not administered)  azithromycin (ZITHROMAX) 500 mg in dextrose 5 % 250 mL IVPB (not administered)     Initial Impression / Assessment and Plan / ED Course  I have reviewed the triage vital signs and the nursing notes.  Pertinent labs & imaging results that were available during my care of the patient were reviewed by me and considered in my medical decision making (see chart for details).  Clinical Course   Patient having fevers, chills and cough at home. She is currently being treated for leukemia, receiving chemotherapy. She reports a fever of 102 at home, but was afebrile at arrival after having taken aspirin at home. Patient appears pale and weak. She is anemic beyond her baseline, excluding pancytopenia. Reports 2.1 but she is not neutropenic. She was borderline neutropenic earlier in week, however. Chest  x-ray shows evidence of pneumonia. Patient will be admitted for IV antibiotic therapy for healthcare associated pneumonia.  Final Clinical Impressions(s) / ED Diagnoses   Final diagnoses:  HCAP (healthcare-associated pneumonia)    New Prescriptions New Prescriptions  No medications on file  I personally performed the services described in this documentation, which was scribed in my presence. The recorded information has been reviewed and is accurate.    Orpah Greek, MD 04/23/16 [phone]

## 2016-04-23 NOTE — ED Triage Notes (Signed)
Pt BIB [facility] from home for fever and generalized body aches. EMS reports husband got temp of 102 before EMS arrived. Patient c/o generalized weakness since noon yesterday. Patient has leukemia and is supposed to have treatment tomorrow. EMS reports one episode of emesis in route to hospital. 4 mg zofran given by EMS.

## 2016-04-23 NOTE — ED Notes (Signed)
Patient aware urine sample is needed.  

## 2016-04-23 NOTE — H&P (Signed)
History and Physical  Patient Name: Kristina Meyer     [id number]    DOB: 08/16/1940    DOA: 04/23/2016 PCP: Jobe Marker, MD  Oncology: Dr. Marin Olp    Patient coming from: Home  Chief Complaint: Fever, myalgias  HPI: Kristina Meyer is a 75 y.o. 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  The patient was in her usual state of health, got her Cytovene infusions last last Tuesday, and then over the last 3 or 4 days has developed progressive weakness and "just feeling awful". At first she thought she was just getting anemic again because of how tired she was over the weekend, but then tonight at 2AM she woke with fever to 102F, vomited, had worse malaise and chills and came to the ER.   She has had no dysuria or other urinary irritative symptoms.  She has had a new nonproductive cough this week.  No nose bleeds, bleeding from gums.  ED course: -Afebrile, heart rate 98, respirations and pulse ox normal, BP 108/55 -Na 136, K 3.1, Cr 1.03 (baseline 0.8-1)  -WBC 2.1K (ANC 1700), Hgb 6.7, platelets 28K -UA clear -Lactic acid 0.8 -CXR showed new left base opacity -Type and screen was obtained -She was given cefepime and azithromycin, blood cultures were obtained, IVF were administered and TRH were asked to admit for anemia and pneumonia     ROS: Review of Systems  Constitutional: Positive for chills, fever and malaise/fatigue.  Respiratory: Positive for cough. Negative for sputum production and shortness of breath.   Genitourinary: Negative for dysuria, frequency, hematuria and urgency.  Neurological: Positive for weakness.  All other systems reviewed and are negative.         Past Medical History:  Diagnosis Date  . 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 Anxiety   . Heart murmur    echo done 20 yrs ago  normal  . Hypertension   . PONV (postoperative nausea and vomiting)    req patch    Past  Surgical History:  Procedure Laterality Date  . BREAST CYST EXCISION Left 1970  . BREAST SURGERY Left 76   lump   . EYE SURGERY Bilateral 09   cataracts  . HEMORRHOID SURGERY  87  . IR GENERIC HISTORICAL  01/11/2016   IR US GUIDE VASC ACCESS RIGHT 01/11/2016 Jacqulynn Cadet, MD [facility]  . IR GENERIC HISTORICAL  01/11/2016   IR FLUORO GUIDE CV LINE RIGHT 01/11/2016 Jacqulynn Cadet, MD [facility]  . LUMBAR LAMINECTOMY/DECOMPRESSION MICRODISCECTOMY Right 12/02/2013   Procedure: L5-S1 RIGHT DISCECTOMY;  Surgeon: Melina Schools, MD;  Location: [facility];  Service: Orthopedics;  Laterality: Right;    Social History: Patient lives with her husband.  The patient walks unassisted at baseline.  She is a remote former smoker.  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  She is from [place] originally.  Has one son.    Allergies  Allergen Reactions  . Other Other (See Comments)    GENERAL Anesthesia, vomiting    Family history: family history includes Depression in her mother; Heart Problems in her father; Heart failure in her mother; Leukemia in her cousin.  Prior to Admission medications   Medication Sig Start Date End Date Taking? Authorizing Provider  ALPRAZolam (XANAX) 0.5 MG tablet TAKE 1 TABLET BY MOUTH 3 TIMES A DAY AS NEEDED Patient taking differently: TAKE 1 TABLET BY MOUTH 3  TIMES A DAY AS NEEDED for anxiety. 03/26/16  Yes Volanda Napoleon, MD  aspirin 81 MG tablet Take 81 mg by mouth daily.    Yes Historical Provider, MD  atorvastatin (LIPITOR) 20 MG tablet Take 10 mg by mouth at bedtime.   Yes Historical Provider, MD  Calcium-Magnesium-Vitamin D (CALCIUM 1200+D3 PO) Take 1 tablet by mouth daily.   Yes Historical Provider, MD  cholecalciferol (VITAMIN D) 1000 UNITS tablet Take 1,000 Units by mouth daily.   Yes Historical Provider, MD  hydrochlorothiazide (HYDRODIURIL) 25 MG tablet Take by mouth. 12/08/16  Yes Historical Provider, MD  lidocaine-prilocaine (EMLA) cream  Apply 1 application topically as needed. Place quarter sized amount of cream directly on portacath and cover with saran wrap at least 1 - 1 1/2 hours prior to procedure. 01/11/16  Yes Volanda Napoleon, MD  Multiple Vitamins-Minerals (MULTIVITAMIN ADULT) TABS Take 1 tablet by mouth daily.   Yes Historical Provider, MD  orphenadrine (NORFLEX) 100 MG tablet Take 1 tablet (100 mg total) by mouth at bedtime as needed for muscle spasms. 12/29/15  Yes Volanda Napoleon, MD  PARoxetine (PAXIL) 20 MG tablet Take 20 mg by mouth daily.   Yes Historical Provider, MD  potassium chloride SA (K-DUR,KLOR-CON) 20 MEQ tablet Take 1 pill twice a day for 7 days, then 1 pill a day. 02/13/16  Yes Volanda Napoleon, MD  prochlorperazine (COMPAZINE) 5 MG tablet Take 1 tablet (5 mg total) by mouth every 6 (six) hours as needed for nausea or vomiting. 01/05/16  Yes Volanda Napoleon, MD  sulfamethoxazole-trimethoprim (BACTRIM DS) 800-160 MG per tablet Take 1 tablet by mouth daily.   Yes Historical Provider, MD       Physical Exam: BP (!) 95/51   Pulse 87   Temp 98.1 F (36.7 C) (Oral)   Resp 16   SpO2 97%  General appearance: Well-developed, elderly adult female, alert and in no acute distress, tired, HOH.   Eyes: Anicteric, bluish sclera, conjunctiva pink, lids and lashes normal. PERRL.    ENT: No nasal deformity, discharge, epistaxis.  Hearing poor. OP moist without lesions.  No bleeding. Neck: No neck masses.  Trachea midline.  No thyromegaly/tenderness. Lymph: No cervical or supraclavicular lymphadenopathy. Skin: Warm and dry.  Pallor noted.  No jaundice.  No suspicious rashes or lesions. Cardiac: Tachycardic, regular, nl S1-S2, SEM, soft.  Capillary refill is brisk.  JVP normal.  No LE edema.  Radial and DP pulses 2+ and symmetric. Respiratory: Normal respiratory rate and rhythm.  CTAB without rales or wheezes.  Diminished focally in left base. Abdomen: Abdomen soft.  No TTP. No ascites, distension, hepatosplenomegaly.     MSK: No deformities or effusions.  No cyanosis or clubbing. Neuro: Cranial nerves normal.  Sensation intact to light touch. Speech is fluent.  Muscle strength normal.    Psych: Sensorium intact and responding to questions, attention normal.  Behavior appropriate.  Affect normal.  Judgment and insight appear normal.     Labs on Admission:  I have personally reviewed following labs and imaging studies: CBC:  Recent Labs Lab 04/16/16 1115 04/23/16 0343  WBC 1.8* 2.1*  NEUTROABS 0.7* 1.8  HGB 8.9* 6.7*  HCT 26.5* 19.7*  MCV 99 97.0  PLT 263 28*   Basic Metabolic Panel:  Recent Labs Lab 04/16/16 1115 04/23/16 0343  NA 140 136  K 3.5 3.1*  CL 102 100*  CO2 28 26  GLUCOSE 130* 157*  BUN 12 24*  CREATININE 0.8 1.03*  CALCIUM 9.3 8.0*   GFR: Estimated Creatinine Clearance: 39.6 mL/min (by C-G formula based on SCr of 1.03 mg/dL (H)).  Liver Function Tests:  Recent Labs Lab 04/16/16 1115 04/23/16 0343  AST 34 19  ALT 25 17  ALKPHOS 88* 74  BILITOT 1.00 1.4*  PROT 6.5 6.7  ALBUMIN 3.7 3.7   No results for input(s): LIPASE, AMYLASE in the last 168 hours. No results for input(s): AMMONIA in the last 168 hours. Coagulation Profile: No results for input(s): INR, PROTIME in the last 168 hours. Cardiac Enzymes: No results for input(s): CKTOTAL, CKMB, CKMBINDEX, TROPONINI in the last 168 hours. BNP (last 3 results) No results for input(s): PROBNP in the last 8760 hours. HbA1C: No results for input(s): HGBA1C in the last 72 hours. CBG: No results for input(s): GLUCAP in the last 168 hours. Lipid Profile: No results for input(s): CHOL, HDL, LDLCALC, TRIG, CHOLHDL, LDLDIRECT in the last 72 hours. Thyroid Function Tests: No results for input(s): TSH, T4TOTAL, FREET4, T3FREE, THYROIDAB in the last 72 hours. Anemia Panel: No results for input(s): VITAMINB12, FOLATE, FERRITIN, TIBC, IRON, RETICCTPCT in the last 72 hours. Sepsis Labs: Lactic acid normal Invalid  input(s): PROCALCITONIN, LACTICIDVEN No results found for this or any previous visit (from the past 240 hour(s)).       Radiological Exams on Admission: Personally reviewed CXR shows faint left base opacity: Dg Chest 2 View  Result Date: 04/23/2016 CLINICAL DATA:  Constant generalized body aches for 3 days. Vomiting, appetite change, weakness, fatigue, and fever. Mild cough. EXAM: CHEST  2 VIEW COMPARISON:  None. FINDINGS: Normal heart size and pulmonary vascularity. Consolidation in the left lung base behind the heart likely representing pneumonia. Right lung is clear. Power port type central venous catheter with tip over the low SVC region. No pneumothorax. Degenerative changes in the spine. IMPRESSION: Consolidation in the left lung base likely representing pneumonia. Electronically Signed   By: Lucienne Capers M.D.   On: 04/23/2016 03:28        Assessment/Plan  1. Pneumonia:  The ANC is >1000.   She was near neutropenic 1 week ago.  Given her relative immune suppression, will cover for pseudomonas initially.   Meets SIRS criteria, no sepsis syndrome clinically at admission. -Cefepime and azithromycin IV -IV fluids -Legionella and strep urine antigens -Follow blood cultures -Sputum culture and gram stain    2. HTN:  Soft BP at admission -Hold HCTZ -Continue aspirin and statin  3. Hypokalemia:  -Check mag -Oral K ordered  4. Anemia:  HGb was 10 g/dL on 11/13.   -Transfuse one unit now -Post-transfusion H/H  5. Thrombocytopenia:  No current bleeding. -Trend CBC  6. Other medications:  -Continue paroxetine -Continue alprazolam PRN -Hold Bactrim UTI PPx -Continue Norflex PRN         DVT prophylaxis: SCDs  Code Status: FULL  Family Communication: Husband at bedside.  CODE STATUS confirmed.  Disposition Plan: Anticipate IV fluids and IV antibiotics for pneumonia.  Transfuse and discuss with oncology today.   Consults called: None overnight, Inbasket FYI  sent to primary oncologist Admission status: OBS, med surg At the point of initial evaluation, it is my clinical opinion that admission for OBSERVATION is reasonable and necessary because the patient's presenting complaints in the context of their chronic conditions represent sufficient risk of deterioration or significant morbidity to constitute reasonable grounds for close observation in the hospital setting, but that the patient may be medically stable for discharge from the hospital within 24  to 48 hours.    Medical decision making: Patient seen at 5:46 AM on 04/23/2016.  The patient was discussed with Dr. Betsey Holiday.  What exists of the patient's chart was reviewed in depth and summarized above.  Clinical condition: heart rate slightly elevated but BP and mentation normal and fluids being given and respiratory status stable for medical floor.        Edwin Dada [facility] Pager [phone]

## 2016-04-23 NOTE — ED Notes (Signed)
Bed: [id number] Expected date:  Expected time:  Means of arrival:  Comments: 75 yo F/ Fever

## 2016-04-23 NOTE — ED Notes (Signed)
Dr.Pollina notified of lab values and order for Type and Screen given

## 2016-04-24 ENCOUNTER — Inpatient Hospital Stay (HOSPITAL_COMMUNITY): Payer: Medicare Other

## 2016-04-24 ENCOUNTER — Encounter (HOSPITAL_COMMUNITY): Payer: Self-pay

## 2016-04-24 DIAGNOSIS — A419 Sepsis, unspecified organism: Secondary | ICD-10-CM

## 2016-04-24 DIAGNOSIS — R6520 Severe sepsis without septic shock: Secondary | ICD-10-CM

## 2016-04-24 DIAGNOSIS — D61818 Other pancytopenia: Secondary | ICD-10-CM

## 2016-04-24 DIAGNOSIS — J189 Pneumonia, unspecified organism: Secondary | ICD-10-CM

## 2016-04-24 DIAGNOSIS — E860 Dehydration: Secondary | ICD-10-CM

## 2016-04-24 DIAGNOSIS — C9300 Acute monoblastic/monocytic leukemia, not having achieved remission: Secondary | ICD-10-CM

## 2016-04-24 LAB — COMPREHENSIVE METABOLIC PANEL
ALBUMIN: 3.1 g/dL — AB (ref 3.5–5.0)
ALT: 18 U/L (ref 14–54)
ANION GAP: 6 (ref 5–15)
AST: 23 U/L (ref 15–41)
Alkaline Phosphatase: 65 U/L (ref 38–126)
BILIRUBIN TOTAL: 1.8 mg/dL — AB (ref 0.3–1.2)
BUN: 31 mg/dL — AB (ref 6–20)
CHLORIDE: 106 mmol/L (ref 101–111)
CO2: 25 mmol/L (ref 22–32)
Calcium: 7.5 mg/dL — ABNORMAL LOW (ref 8.9–10.3)
Creatinine, Ser: 0.85 mg/dL (ref 0.44–1.00)
GFR calc Af Amer: >60 mL/min (ref >60)
GFR calc non Af Amer: >60 mL/min (ref >60)
GLUCOSE: 142 mg/dL — AB (ref 65–99)
POTASSIUM: 3.3 mmol/L — AB (ref 3.5–5.1)
SODIUM: 137 mmol/L (ref 135–145)
TOTAL PROTEIN: 6 g/dL — AB (ref 6.5–8.1)

## 2016-04-24 LAB — PROCALCITONIN: PROCALCITONIN: 3.7 ng/mL

## 2016-04-24 LAB — CBC
HEMATOCRIT: 25.5 % — AB (ref 36.0–46.0)
HEMATOCRIT: 25 % — AB (ref 36.0–46.0)
Hemoglobin: 8.9 g/dL — ABNORMAL LOW (ref 12.0–15.0)
Hemoglobin: 8.7 g/dL — ABNORMAL LOW (ref 12.0–15.0)
MCH: 31.2 pg (ref 26.0–34.0)
MCH: 32 pg (ref 26.0–34.0)
MCHC: 34.9 g/dL (ref 30.0–36.0)
MCHC: 34.8 g/dL (ref 30.0–36.0)
MCV: 89.5 fL (ref 78.0–100.0)
MCV: 91.9 fL (ref 78.0–100.0)
Platelets: 21 10*3/uL — CL (ref 150–400)
Platelets: 16 10*3/uL — CL (ref 150–400)
RBC: 2.85 MIL/uL — ABNORMAL LOW (ref 3.87–5.11)
RBC: 2.72 MIL/uL — ABNORMAL LOW (ref 3.87–5.11)
RDW: 19.2 % — AB (ref 11.5–15.5)
RDW: 19.6 % — AB (ref 11.5–15.5)
WBC: 3 10*3/uL — AB (ref 4.0–10.5)
WBC: 1.7 10*3/uL — AB (ref 4.0–10.5)

## 2016-04-24 LAB — URINE CULTURE: Culture: <10000 — AB

## 2016-04-24 LAB — RESPIRATORY PANEL BY PCR
Adenovirus: NOT DETECTED
BORDETELLA PERTUSSIS-RVPCR: NOT DETECTED
CHLAMYDOPHILA PNEUMONIAE-RVPPCR: NOT DETECTED
CORONAVIRUS 229E-RVPPCR: NOT DETECTED
CORONAVIRUS HKU1-RVPPCR: NOT DETECTED
Coronavirus NL63: NOT DETECTED
Coronavirus OC43: NOT DETECTED
Influenza A: NOT DETECTED
Influenza B: NOT DETECTED
Metapneumovirus: NOT DETECTED
Mycoplasma pneumoniae: NOT DETECTED
Parainfluenza Virus 1: NOT DETECTED
Parainfluenza Virus 2: NOT DETECTED
Parainfluenza Virus 3: NOT DETECTED
Parainfluenza Virus 4: NOT DETECTED
RHINOVIRUS / ENTEROVIRUS - RVPPCR: NOT DETECTED
Respiratory Syncytial Virus: NOT DETECTED

## 2016-04-24 LAB — INFLUENZA PANEL BY PCR (TYPE A & B)
INFLAPCR: NEGATIVE
INFLBPCR: NEGATIVE

## 2016-04-24 LAB — LACTIC ACID, PLASMA: LACTIC ACID, VENOUS: 1.1 mmol/L (ref 0.5–1.9)

## 2016-04-24 LAB — LEGIONELLA PNEUMOPHILA SEROGP 1 UR AG: L. PNEUMOPHILA SEROGP 1 UR AG: NEGATIVE

## 2016-04-24 LAB — MRSA PCR SCREENING: MRSA by PCR: NEGATIVE

## 2016-04-24 MED ORDER — SODIUM CHLORIDE 0.9% FLUSH
10.0000 mL | Freq: Two times a day (BID) | INTRAVENOUS | Status: DC
  Administered 2016-04-24 – 2016-04-29 (×8): 10 mL
  Administered 2016-04-29: 30 mL
  Administered 2016-04-30: 10 mL
  Administered 2016-04-30: 20 mL
  Administered 2016-05-01: 30 mL
  Administered 2016-05-01 – 2016-05-10 (×15): 10 mL
  Administered 2016-05-10 – 2016-05-11 (×2): 20 mL
  Administered 2016-05-11: 30 mL
  Administered 2016-05-12 (×2): 10 mL
  Administered 2016-05-13: 30 mL
  Administered 2016-05-13 – 2016-05-14 (×3): 10 mL

## 2016-04-24 MED ORDER — FILGRASTIM 480 MCG/1.6ML IJ SOLN
480.0000 ug | Freq: Every day | INTRAMUSCULAR | Status: DC
  Administered 2016-04-24 – 2016-04-25 (×2): 480 ug via SUBCUTANEOUS
  Filled 2016-04-24 (×5): qty 1.6

## 2016-04-24 MED ORDER — KETOROLAC TROMETHAMINE 15 MG/ML IJ SOLN: 15.0000 mg | Freq: Four times a day (QID) | INTRAMUSCULAR | Status: DC | PRN

## 2016-04-24 MED ORDER — VANCOMYCIN HCL IN DEXTROSE 1-5 GM/200ML-% IV SOLN
1000.0000 mg | Freq: Once | INTRAVENOUS | Status: AC
  Administered 2016-04-24: 1000 mg via INTRAVENOUS
  Filled 2016-04-24: qty 200

## 2016-04-24 MED ORDER — SODIUM CHLORIDE 0.9 % IJ SOLN
INTRAMUSCULAR | Status: AC
Start: 2016-04-24 — End: 2016-04-25
  Filled 2016-04-24: qty 50

## 2016-04-24 MED ORDER — IOPAMIDOL (ISOVUE-370) INJECTION 76%
INTRAVENOUS | Status: AC
Start: 2016-04-24 — End: 2016-04-25
  Filled 2016-04-24: qty 100

## 2016-04-24 MED ORDER — SODIUM CHLORIDE 0.9% FLUSH
10.0000 mL | INTRAVENOUS | Status: DC | PRN
  Administered 2016-05-14 – 2016-05-15 (×2): 10 mL
  Filled 2016-04-24 (×2): qty 40

## 2016-04-24 MED ORDER — SODIUM CHLORIDE 0.9 % IV SOLN
1.0000 g | Freq: Three times a day (TID) | INTRAVENOUS | Status: DC
  Administered 2016-04-24 – 2016-05-05 (×33): 1 g via INTRAVENOUS
  Filled 2016-04-24 (×36): qty 1

## 2016-04-24 MED ORDER — SODIUM CHLORIDE 0.9 % IV SOLN
INTRAVENOUS | Status: AC
  Administered 2016-04-24: 16:00:00 via INTRAVENOUS

## 2016-04-24 MED ORDER — VANCOMYCIN HCL 500 MG IV SOLR
500.0000 mg | Freq: Two times a day (BID) | INTRAVENOUS | Status: DC
  Administered 2016-04-25 – 2016-04-26 (×4): 500 mg via INTRAVENOUS
  Filled 2016-04-24 (×6): qty 500

## 2016-04-24 MED ORDER — DEXTROSE 5 % IV SOLN
2.0000 g | Freq: Three times a day (TID) | INTRAVENOUS | Status: DC
  Administered 2016-04-24: 2 g via INTRAVENOUS
  Filled 2016-04-24 (×2): qty 2

## 2016-04-24 MED ORDER — SODIUM CHLORIDE 0.9 % IV BOLUS (SEPSIS)
1000.0000 mL | Freq: Once | INTRAVENOUS | Status: AC
  Administered 2016-04-24: 1000 mL via INTRAVENOUS

## 2016-04-24 MED ORDER — IOPAMIDOL (ISOVUE-370) INJECTION 76%
100.0000 mL | Freq: Once | INTRAVENOUS | Status: AC | PRN
  Administered 2016-04-24: 100 mL via INTRAVENOUS

## 2016-04-24 MED ORDER — ORAL CARE MOUTH RINSE
15.0000 mL | Freq: Two times a day (BID) | OROMUCOSAL | Status: DC
  Administered 2016-04-25 – 2016-04-26 (×4): 15 mL via OROMUCOSAL

## 2016-04-24 NOTE — Progress Notes (Signed)
Pharmacy Antibiotic Note  Kristina Meyer is a 75 y.o. female admitted on 04/23/2016 with neutropenic fever.  Pharmacy has been consulted for vancomycin dosing.  Dr. Marin Olp has change her cefepime to carbapenem therapy.  His note states CXR with progression of LLL pneumonia thus reasons for changes in antibiotics.     Today, 04/24/2016:  WBC = 1.7 and decreasing - starting G-CSF  Renal: SCr WNL  Plan:  Vancomycin 1gm IV x 1 then 500mg  IV q12h  Meropenem dose currently appropriate for renal fx  Follow cultures and ability to narrow antibiotics  Check trough if remains on vancomycin > 72h  Height: 5\' 2"  (157.5 cm) Weight: 147 lb 4.8 oz (66.8 kg) IBW/kg (Calculated) : 50.1  Temp (24hrs), Avg:100.5 F (38.1 C), Min:98.6 F (37 C), Max:102.9 F (39.4 C)   Recent Labs Lab 04/23/16 0343 04/23/16 0358 04/23/16 1622 04/24/16 0523  WBC 2.1*  --  3.0* 1.7*  CREATININE 1.03*  --   --  0.85  LATICACIDVEN  --  0.82  --   --     Estimated Creatinine Clearance: 49.7 mL/min (by C-G formula based on SCr of 0.85 mg/dL).    Allergies  Allergen Reactions  . Other Other (See Comments)    GENERAL Anesthesia, vomiting    Antimicrobials this admission:  11/27  cefepime >> 11/28 11/28 Meropenem >> 11/27 azithromycin >>   Dose adjustments this admission:    Microbiology results:  11/27 BCx: sent 11/27 UCx: insignificant growth 11/27: Legionella antigen: 11/27: Strep antigen: neg  Thank you for allowing pharmacy to be a part of this patient's care.  Doreene Eland, PharmD, BCPS.   Pager: [phone] 04/24/2016 2:14 PM

## 2016-04-24 NOTE — Progress Notes (Signed)
Called patient's husband to inform him of patient transfer to SD unit and update on new room number.

## 2016-04-24 NOTE — Progress Notes (Signed)
Pt SAT's dropped to low 80's on RA. Pt put on 2 LPM O2 at 0445.

## 2016-04-24 NOTE — Consult Note (Signed)
PULMONARY / CRITICAL CARE MEDICINE   Name: Kristina Meyer MRN: [id number] DOB: 19-Mar-1941    ADMISSION DATE:  04/23/2016 CONSULTATION DATE:  04/24/16  REFERRING MD:  Dr Carolin Sicks, [place]: HCAP, neutropenic sepsis  HISTORY OF PRESENT ILLNESS:   75 yo woman with HTN and AML receiving decitabine, last dose 1 weeks ago. Reported progressive weakness, lethargy, fever, and malaise. Also developed a non-productive cough. Presented for eval that revealed LLL infiltrate. Flu screen, pneumococcal PNA, legionella PNA screens negative. ANC was low but > 1500. Hgb was 6.7, received PRBC. She started cefepime and azithro. All cx data so far negative. On 11/28 has continued to have fever, WBC dropping, has had some intermittent episodes of hypotension. She received IVF and abx were changed to vanco + imipenem. She has been progressively weak and now moves to SDU. PCCM consulted to eval   PAST MEDICAL HISTORY :  She  has a past medical history of AML M5 (acute monocytic leukemia) ([place]) (01/05/2016); Anxiety; Heart murmur; Hypertension; and PONV (postoperative nausea and vomiting).  PAST SURGICAL HISTORY: She  has a past surgical history that includes Breast surgery (Left, 76); Hemorrhoid surgery (87); Eye surgery (Bilateral, 09); Lumbar laminectomy/decompression microdiscectomy (Right, 12/02/2013); Breast cyst excision (Left, 1970); ir generic historical (01/11/2016); and ir generic historical (01/11/2016).  Allergies  Allergen Reactions  . Other Other (See Comments)    GENERAL Anesthesia, vomiting    No current facility-administered medications on file prior to encounter.    Current Outpatient Prescriptions on File Prior to Encounter  Medication Sig  . ALPRAZolam (XANAX) 0.5 MG tablet TAKE 1 TABLET BY MOUTH 3 TIMES A DAY AS NEEDED (Patient taking differently: TAKE 1 TABLET BY MOUTH 3 TIMES A DAY AS NEEDED for anxiety.)  . aspirin 81 MG tablet Take 81 mg by mouth daily.   Marland Kitchen atorvastatin  (LIPITOR) 20 MG tablet Take 10 mg by mouth at bedtime.  . Calcium-Magnesium-Vitamin D (CALCIUM 1200+D3 PO) Take 1 tablet by mouth daily.  . cholecalciferol (VITAMIN D) 1000 UNITS tablet Take 1,000 Units by mouth daily.  Derrill Memo ON 12/08/2016] hydrochlorothiazide (HYDRODIURIL) 25 MG tablet Take by mouth.  . lidocaine-prilocaine (EMLA) cream Apply 1 application topically as needed. Place quarter sized amount of cream directly on portacath and cover with saran wrap at least 1 - 1 1/2 hours prior to procedure.  . Multiple Vitamins-Minerals (MULTIVITAMIN ADULT) TABS Take 1 tablet by mouth daily.  . orphenadrine (NORFLEX) 100 MG tablet Take 1 tablet (100 mg total) by mouth at bedtime as needed for muscle spasms.  Marland Kitchen PARoxetine (PAXIL) 20 MG tablet Take 20 mg by mouth daily.  . potassium chloride SA (K-DUR,KLOR-CON) 20 MEQ tablet Take 1 pill twice a day for 7 days, then 1 pill a day.  . prochlorperazine (COMPAZINE) 5 MG tablet Take 1 tablet (5 mg total) by mouth every 6 (six) hours as needed for nausea or vomiting.  . sulfamethoxazole-trimethoprim (BACTRIM DS) 800-160 MG per tablet Take 1 tablet by mouth daily.    FAMILY HISTORY:  Her indicated that the status of her mother is unknown. She indicated that the status of her father is unknown. She indicated that the status of her cousin is unknown.    SOCIAL HISTORY: She  reports that she quit smoking about 49 years ago. Her smoking use included Cigarettes. She has a 2.50 pack-year smoking history. She has never used smokeless tobacco. She reports that she does not drink alcohol or use drugs.  REVIEW OF  SYSTEMS:   C/o some L chest discomfort w cough, no sputum. She has had fever, weakness, malaise, one episode of emesis.   SUBJECTIVE:  Feels a bit better  VITAL SIGNS: BP (!) 101/40 (BP Location: Left Arm)   Pulse (!) 105   Temp (!) 102.4 F (39.1 C) (Oral)   Resp 18   Ht 5\' 2"  (1.575 m)   Wt 66.8 kg (147 lb 4.8 oz)   SpO2 92%   BMI 26.94  kg/m   HEMODYNAMICS:    VENTILATOR SETTINGS:    INTAKE / OUTPUT: I/O last 3 completed shifts: In: 2518 [P.O.:480; I.V.:1260; Blood:678; IV Piggyback:100] Out: 350 [Urine:350]  PHYSICAL EXAMINATION: General:  Ill appearing elderly woman, NAD Neuro:  Awake, interacting appropriately, non-focal HEENT:  OP clear, PERRL Cardiovascular:  Tachycardic, regular Lungs:  B expiratory rhonchi, louder on the L Abdomen:  benign Musculoskeletal:  No edema Skin:  No rash  LABS:  BMET  Recent Labs Lab 04/23/16 0343 04/24/16 0523  NA 136 137  K 3.1* 3.3*  CL 100* 106  CO2 26 25  BUN 24* 31*  CREATININE 1.03* 0.85  GLUCOSE 157* 142*    Electrolytes  Recent Labs Lab 04/23/16 0343 04/24/16 0523  CALCIUM 8.0* 7.5*  MG 2.0  --     CBC  Recent Labs Lab 04/23/16 0343 04/23/16 1622 04/24/16 0523  WBC 2.1* 3.0* 1.7*  HGB 6.7* 8.9* 8.7*  HCT 19.7* 25.5* 25.0*  PLT 28* 21* 16*    Coag's No results for input(s): APTT, INR in the last 168 hours.  Sepsis Markers  Recent Labs Lab 04/23/16 0358  LATICACIDVEN 0.82    ABG No results for input(s): PHART, PCO2ART, PO2ART in the last 168 hours.  Liver Enzymes  Recent Labs Lab 04/23/16 0343 04/24/16 0523  AST 19 23  ALT 17 18  ALKPHOS 74 65  BILITOT 1.4* 1.8*  ALBUMIN 3.7 3.1*    Cardiac Enzymes No results for input(s): TROPONINI, PROBNP in the last 168 hours.  Glucose No results for input(s): GLUCAP in the last 168 hours.  Imaging Dg Chest 2 View  Result Date: 04/24/2016 CLINICAL DATA:  Followup pneumonia. Left-sided chest pain and fever. EXAM: CHEST  2 VIEW COMPARISON:  04/23/2016 FINDINGS: Power port appears unchanged. Right lung remains clear. There is progressive infiltrate throughout the left lower lobe with only mild volume loss. No acute bone finding. IMPRESSION: Left lower lobe pneumonia is progressive since yesterday's film. Electronically Signed   By: Nelson Chimes M.D.   On: 04/24/2016 09:08      STUDIES:  CT chest 11/28 >>   CULTURES: Blood 11/27 >>  Urine 11/27 >> insignificant growth Blood 11/28 >>  Urine 11/28 >>  Viral resp panel 11/28 >>   ANTIBIOTICS: Cefepime 11/27 > 11/28 azithro 11/27 >>  vanco 11/28 >>  meropenem 11/28 >>    SIGNIFICANT EVENTS:  LINES/TUBES: Port-s-cath >>   ASSESSMENT / PLAN:  PULMONARY A: Suspected LLL HCAP based on presentation and CXR Chest pain, consider L effusion or even PE P:   pulm hygiene Check CT-PA to better eval parenchyma, pleural space and to r/o PE given her chest pain Consider thora if L pleural fluid present; doubt based on CXR  CARDIOVASCULAR A:  Tachycardia, sinus Transient hypotension, improved w IVF P:  Telemetry Hold home HCTZ; careful w norflex, xanax Consider CVC placement to obtain CVP's if refractory hypotension. She does have adequate access for pressors if they become necessary  abx as below  Check lactate   RENAL A:   Mild acute renal insufficiency, improved w volume P:   Follow BMP and UOP  GASTROINTESTINAL A:   SUP Nutrition Nausea, improved P:   Defer pepcid for now given thrombocytopenia.   HEMATOLOGIC / ONCOLOGIC A:   AML receiving treatment, last 1 weeks PTA Thrombocytopenia Anemia s/p PRBC 11/27 Leukopenia, ANC on 11/27 > 1500 P:  S/p PRBC x 1 Follow CBC + diff Follow for evidence active bleeding  INFECTIOUS A:   Suspected LLL HCAP Severe sepsis without shock P:   Agree with broad abx, continue imipenem and vanco. Will add back azithro for atypical coverage, will narrow as able./  Check Pct Follow cx data  ENDOCRINE A:   No hx DM P:   Follow glucose on BMP  NEUROLOGIC A:   Anxiety / depression P:   RASS goal: 0 Continue home Paxil  Careful with xanax    FAMILY  - Updates: No family at bedside, plans discussed with the patient.   - Inter-disciplinary family meet or Palliative Care meeting due by:  12/5   Baltazar Apo, MD, PhD 04/24/2016,  4:15 PM [facility] [phone] or if no answer [phone]

## 2016-04-24 NOTE — Progress Notes (Signed)
Kristina Meyer had a little bit of a rough night. There was a little of a cough. She had a temperature of 1 and 2.9 yesterday.  She continues on the IV antibiotics. I think she is on Maxipime and Zithromax. Her influenza A titer was negative.  Her chest x-ray today showed progression of the left lower lobe pneumonia.  I will start her on Neupogen. She is neutropenic. I think we have to get her on Neupogen to try to stimulate her immune system.  I would not think that this is a fungal pneumonia. Fungal pneumonia typically is upper lobe.  Her white cell count was 1.7 today. Her hemoglobin was 8.7. Her platelet count was 16,000. There is no bleeding. As such, I don't think we have to transfuse her with platelets.  I'm not sure why her Port-A-Cath is not being used. We really need to use the Port-A-Cath.  She denies any count of chest pain. She's had no nausea or vomiting. Her appetite is still down a little bit.  On her physical exam, her temperature is 100.3. Her pulse is 105 area and her blood pressure is 96/44. Her oxygen saturation is 95%. Her lungs sound as if there is some congestion at the bases. Cardiac exam is tachycardic but regular. Abdomen is soft. Extremities shows no clubbing, cyanosis or edema. Neurological exam shows no focal neurological deficits.  I probably would change her antibiotics a little bit. I think Primaxin would be a good choice for her. I will try Primaxin instead of Maxipime. I think there might be some broader coverage with the Primaxin.  We may also want to consider vancomycin.  If she continues to spike, doing may have to consider getting pulmonary involved and possibly bronchoscopy and possibly adding antifungal coverage.  Her situation is somewhat tenuous. I'm glad that she was moved down to a monitored floor. Unfortunately, I does have a sense that things might get a little bit more critical before they get better.  I know she is trying her best. I know she is  getting fantastic care small the staff on 4 [address].  Lattie Haw, MD  Proverbs 3:5-6

## 2016-04-24 NOTE — Progress Notes (Signed)
Pharmacy - Brief Note (antibiotic adjustment)  Worsening WBC with fever overnight.  On cefepime for pneumonia.  CrCl ~38ml/min   Plan: Increase cefepime to 2gm IV q8h for neutropenic fever  Doreene Eland, PharmD, BCPS.   Pager: [phone] 04/24/2016 11:30 AM

## 2016-04-24 NOTE — Care Management Note (Signed)
Case Management Note  Patient Details  Name: Kristina Meyer MRN: [id number] Date of Birth: [birth year]  Subjective/Objective: 75 y/o f admitted w/HCAP. From home.                   Action/Plan:d/c plan home.   Expected Discharge Date:                  Expected Discharge Plan:  Home/Self Care  In-House Referral:     Discharge planning Services  CM Consult  Post Acute Care Choice:    Choice offered to:     DME Arranged:    DME Agency:     HH Arranged:    HH Agency:     Status of Service:  In process, will continue to follow  If discussed at Long Length of Stay Meetings, dates discussed:    Additional Comments:  Dessa Phi, RN 04/24/2016, 1:08 PM

## 2016-04-24 NOTE — Progress Notes (Signed)
Patient had 102.9 temp last night and continuing to cough. Per protocol, placed patient on droplet precautions and paged MD to obtain orders for influenza panel and respiratory panel.  Orders added.  Informed patient and family.  Will continue to monitor.

## 2016-04-24 NOTE — Progress Notes (Addendum)
PROGRESS NOTE    Landa Vastola  [id number] DOB: 11/08/1940 DOA: 04/23/2016 PCP: Jobe Marker, MD   Brief Narrative: 75 y.o. 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  Assessment & Plan:   # Sepsis due to Left lower lung pneumonia in immunocompromised patient (neutropenic fever): Fever, pna, hypotension. Unable to mount leukocytosis because of chemotherapy. -patient continue to spike with antibiotics and tylenol. Abx broadened to IV vancomycin, meropenem and azithromycin. Added toradol prn. -blood culture no growth from 11/27. Repeat cultures today.  -UrineLegionella, infleunza and strep antigen negative -on IVF. Monitor blood pressure.  -CXR with worsening pneumonia. -because of high fever, hypotension, worsening pneumonia on abx, immunusuppression, ? Need for bronchoscopy. I discussed with Dr. Lamonte Sakai from [facility] team for the consult. I will escalate patient's care to stepdown unit.  -Continue supportive care.  #  Pancytopenia ([place]) due to chemotherapy: Received 2 units of red blood cell transfusion on 1127. Hb 8.7 today.  -platelet count of 16, no sign of bleeding. Evaluated by Dr. Marin Olp from Hem/oncology. No plt transfusion. -neupogen order by oncologist. Monitor WBC.  #Hypotension: Continue to hold BP medications. Continue IVF. Monitor Bp. Will monitor in stepdown. PCCM will follow.    # Hypokalemia: on KCL 40 meq daily. Mg acceptable. Monitor BMP.   DVT prophylaxis: SCD. No anticoagulation because of thrombocytopenia and increased bleeding risk Code Status: Full code Family Communication: Patient's son and husband at bedside Disposition Plan: Now transferring to stepdown unit for close monitoring. Likely discharge home in 2-3 days depending on clinical improvement.  Consultants:   Hematology oncologist  Consulted critical care  today, Dr. Lamonte Sakai.  Procedures: None Antimicrobials: IV vanco, meropenem and azithromycin.  Subjective: Patient was seen and examined at bedside. Patient reported weakness, dry cough. She has fever chills. Denied chest pain, headache, dizziness. Reports nausea but no vomiting or abdominal pain. Patient's husband and son at bedside.   Objective: Vitals:   04/24/16 1135 04/24/16 1138 04/24/16 1200 04/24/16 1400  BP:    (!) 101/40  Pulse:    (!) 105  Resp:    18  Temp: (!) 102.7 F (39.3 C)  100.3 F (37.9 C) (!) 102.4 F (39.1 C)  TempSrc: Oral  Oral Oral  SpO2: (!) 81% 95%  92%  Weight:      Height:        Intake/Output Summary (Last 24 hours) at 04/24/16 1446 Last data filed at 04/24/16 1433  Gross per 24 hour  Intake             1640 ml  Output                0 ml  Net             1640 ml   Filed Weights   04/23/16 1030 04/23/16 2151  Weight: 61 kg (134 lb 7.7 oz) 66.8 kg (147 lb 4.8 oz)    Examination:  General exam: Elderly female lying on bed, looks ill. Respiratory system: Left basal crackle, no wheezing Cardiovascular system: Regular rate rhythm, S1 and S2 normal. No pedal edema. Gastrointestinal system: Bowel sound positive. Abdomen soft, nontender. Central nervous system: Alert, awake, oriented. No focal neurological deficit.Marland Kitchen Extremities: Symmetric 5 x 5 power. Unchanged Skin: No rashes, lesions or ulcers Psychiatry: Judgement and insight appear normal. Mood & affect appropriate.     Data  Reviewed: I have personally reviewed following labs and imaging studies  CBC:  Recent Labs Lab 04/23/16 0343 04/23/16 1622 04/24/16 0523  WBC 2.1* 3.0* 1.7*  NEUTROABS 1.8  --   --   HGB 6.7* 8.9* 8.7*  HCT 19.7* 25.5* 25.0*  MCV 97.0 89.5 91.9  PLT 28* 21* 16*   Basic Metabolic Panel:  Recent Labs Lab 04/23/16 0343 04/24/16 0523  NA 136 137  K 3.1* 3.3*  CL 100* 106  CO2 26 25  GLUCOSE 157* 142*  BUN 24* 31*  CREATININE 1.03* 0.85  CALCIUM 8.0*  7.5*  MG 2.0  --    GFR: Estimated Creatinine Clearance: 49.7 mL/min (by C-G formula based on SCr of 0.85 mg/dL). Liver Function Tests:  Recent Labs Lab 04/23/16 0343 04/24/16 0523  AST 19 23  ALT 17 18  ALKPHOS 74 65  BILITOT 1.4* 1.8*  PROT 6.7 6.0*  ALBUMIN 3.7 3.1*   No results for input(s): LIPASE, AMYLASE in the last 168 hours. No results for input(s): AMMONIA in the last 168 hours. Coagulation Profile: No results for input(s): INR, PROTIME in the last 168 hours. Cardiac Enzymes: No results for input(s): CKTOTAL, CKMB, CKMBINDEX, TROPONINI in the last 168 hours. BNP (last 3 results) No results for input(s): PROBNP in the last 8760 hours. HbA1C: No results for input(s): HGBA1C in the last 72 hours. CBG: No results for input(s): GLUCAP in the last 168 hours. Lipid Profile: No results for input(s): CHOL, HDL, LDLCALC, TRIG, CHOLHDL, LDLDIRECT in the last 72 hours. Thyroid Function Tests: No results for input(s): TSH, T4TOTAL, FREET4, T3FREE, THYROIDAB in the last 72 hours. Anemia Panel: No results for input(s): VITAMINB12, FOLATE, FERRITIN, TIBC, IRON, RETICCTPCT in the last 72 hours. Sepsis Labs:  Recent Labs Lab 04/23/16 0358  LATICACIDVEN 0.82    Recent Results (from the past 240 hour(s))  Culture, blood (Routine X 2) w Reflex to ID Panel     Status: None (Preliminary result)   Collection Time: 04/23/16  3:43 AM  Result Value Ref Range Status   Specimen Description BLOOD LEFT ANTECUBITAL  Final   Special Requests BOTTLES DRAWN AEROBIC AND ANAEROBIC 5ML  Final   Culture   Final    NO GROWTH 1 DAY Performed at [facility]    Report Status PENDING  Incomplete  Culture, blood (Routine X 2) w Reflex to ID Panel     Status: None (Preliminary result)   Collection Time: 04/23/16  3:50 AM  Result Value Ref Range Status   Specimen Description BLOOD RIGHT ANTECUBITAL  Final   Special Requests BOTTLES DRAWN AEROBIC AND ANAEROBIC 5ML  Final   Culture    Final    NO GROWTH 1 DAY Performed at [facility]    Report Status PENDING  Incomplete  Urine culture     Status: Abnormal   Collection Time: 04/23/16  4:19 AM  Result Value Ref Range Status   Specimen Description URINE, CLEAN CATCH  Final   Special Requests NONE  Final   Culture (A)  Final    <10,000 COLONIES/mL INSIGNIFICANT GROWTH Performed at [facility]    Report Status 04/24/2016 FINAL  Final         Radiology Studies: Dg Chest 2 View  Result Date: 04/24/2016 CLINICAL DATA:  Followup pneumonia. Left-sided chest pain and fever. EXAM: CHEST  2 VIEW COMPARISON:  04/23/2016 FINDINGS: Power port appears unchanged. Right lung remains clear. There is progressive infiltrate throughout the left lower lobe  with only mild volume loss. No acute bone finding. IMPRESSION: Left lower lobe pneumonia is progressive since yesterday's film. Electronically Signed   By: Nelson Chimes M.D.   On: 04/24/2016 09:08   Dg Chest 2 View  Result Date: 04/23/2016 CLINICAL DATA:  Constant generalized body aches for 3 days. Vomiting, appetite change, weakness, fatigue, and fever. Mild cough. EXAM: CHEST  2 VIEW COMPARISON:  None. FINDINGS: Normal heart size and pulmonary vascularity. Consolidation in the left lung base behind the heart likely representing pneumonia. Right lung is clear. Power port type central venous catheter with tip over the low SVC region. No pneumothorax. Degenerative changes in the spine. IMPRESSION: Consolidation in the left lung base likely representing pneumonia. Electronically Signed   By: Lucienne Capers M.D.   On: 04/23/2016 03:28        Scheduled Meds: . aspirin EC  81 mg Oral Daily  . atorvastatin  10 mg Oral QHS  . azithromycin  500 mg Intravenous Q24H  . filgrastim  480 mcg Subcutaneous Daily  . meropenem (MERREM) IV  1 g Intravenous Q8H  . PARoxetine  20 mg Oral Daily  . potassium chloride  40 mEq Oral Daily  . [START ON 04/25/2016] vancomycin  500  mg Intravenous Q12H  . vancomycin  1,000 mg Intravenous Once   Continuous Infusions: . sodium chloride       LOS: 1 day    Joseff Luckman Tanna Furry, MD [facility] Pager [phone]  If 7PM-7AM, please contact night-coverage [email or web address] Password [facility] 04/24/2016, 2:46 PM

## 2016-04-25 ENCOUNTER — Inpatient Hospital Stay (HOSPITAL_COMMUNITY): Payer: Medicare Other

## 2016-04-25 ENCOUNTER — Other Ambulatory Visit: Payer: Medicare Other

## 2016-04-25 ENCOUNTER — Ambulatory Visit: Payer: Medicare Other | Admitting: Family Medicine

## 2016-04-25 DIAGNOSIS — R6521 Severe sepsis with septic shock: Secondary | ICD-10-CM

## 2016-04-25 DIAGNOSIS — J9601 Acute respiratory failure with hypoxia: Secondary | ICD-10-CM

## 2016-04-25 DIAGNOSIS — G9340 Encephalopathy, unspecified: Secondary | ICD-10-CM

## 2016-04-25 DIAGNOSIS — Z9889 Other specified postprocedural states: Secondary | ICD-10-CM

## 2016-04-25 DIAGNOSIS — A419 Sepsis, unspecified organism: Secondary | ICD-10-CM

## 2016-04-25 LAB — BLOOD GAS, ARTERIAL
ACID-BASE DEFICIT: 1.2 mmol/L (ref 0.0–2.0)
Acid-base deficit: 1.6 mmol/L (ref 0.0–2.0)
Bicarbonate: 20.9 mmol/L (ref 20.0–28.0)
Bicarbonate: 21.9 mmol/L (ref 20.0–28.0)
DRAWN BY: 441261
DRAWN BY: 441261
FIO2: 50
FIO2: 50
MECHVT: 300 mL
O2 SAT: 93.4 %
O2 Saturation: 97.2 %
PCO2 ART: 34 mmHg (ref 32.0–48.0)
PEEP/CPAP: 8 cmH2O
PEEP: 8 cmH2O
Patient temperature: 39.8
Patient temperature: 98.6
RATE: 30 resp/min
RATE: 30 resp/min
VT: 400 mL
pCO2 arterial: 31.3 mmHg — ABNORMAL LOW (ref 32.0–48.0)
pH, Arterial: 7.419 (ref 7.350–7.450)
pH, Arterial: 7.458 — ABNORMAL HIGH (ref 7.350–7.450)
pO2, Arterial: 76.5 mmHg — ABNORMAL LOW (ref 83.0–108.0)
pO2, Arterial: 84.3 mmHg (ref 83.0–108.0)

## 2016-04-25 LAB — PROTEIN, TOTAL: Total Protein: 5.3 g/dL — ABNORMAL LOW (ref 6.5–8.1)

## 2016-04-25 LAB — BASIC METABOLIC PANEL
ANION GAP: 6 (ref 5–15)
BUN: 21 mg/dL — AB (ref 6–20)
CALCIUM: 7 mg/dL — AB (ref 8.9–10.3)
CO2: 21 mmol/L — AB (ref 22–32)
Chloride: 108 mmol/L (ref 101–111)
Creatinine, Ser: 0.74 mg/dL (ref 0.44–1.00)
GFR calc Af Amer: >60 mL/min (ref >60)
GFR calc non Af Amer: >60 mL/min (ref >60)
GLUCOSE: 145 mg/dL — AB (ref 65–99)
Potassium: 3.2 mmol/L — ABNORMAL LOW (ref 3.5–5.1)
Sodium: 135 mmol/L (ref 135–145)

## 2016-04-25 LAB — GLUCOSE, CAPILLARY: GLUCOSE-CAPILLARY: 128 mg/dL — AB (ref 65–99)

## 2016-04-25 LAB — CBC WITH DIFFERENTIAL/PLATELET
BASOS PCT: 0 %
Basophils Absolute: 0 10*3/uL (ref 0.0–0.1)
EOS PCT: 0 %
Eosinophils Absolute: 0 10*3/uL (ref 0.0–0.7)
HEMATOCRIT: 21.8 % — AB (ref 36.0–46.0)
HEMOGLOBIN: 7.5 g/dL — AB (ref 12.0–15.0)
LYMPHS PCT: 40 %
Lymphs Abs: 0.2 10*3/uL — ABNORMAL LOW (ref 0.7–4.0)
MCH: 31.9 pg (ref 26.0–34.0)
MCHC: 34.4 g/dL (ref 30.0–36.0)
MCV: 92.8 fL (ref 78.0–100.0)
MONOS PCT: 2 %
Monocytes Absolute: 0 10*3/uL — ABNORMAL LOW (ref 0.1–1.0)
NEUTROS ABS: 0.3 10*3/uL — AB (ref 1.7–7.7)
NEUTROS PCT: 58 %
Platelets: 15 10*3/uL — CL (ref 150–400)
RBC: 2.35 MIL/uL — ABNORMAL LOW (ref 3.87–5.11)
RDW: 19.2 % — ABNORMAL HIGH (ref 11.5–15.5)
WBC: 0.5 10*3/uL — CL (ref 4.0–10.5)

## 2016-04-25 LAB — BODY FLUID CELL COUNT WITH DIFFERENTIAL
Eos, Fluid: 0 %
LYMPHS FL: 15 %
MONOCYTE-MACROPHAGE-SEROUS FLUID: 2 % — AB (ref 50–90)
Neutrophil Count, Fluid: 83 % — ABNORMAL HIGH (ref 0–25)
Total Nucleated Cell Count, Fluid: 983 cu mm (ref 0–1000)

## 2016-04-25 LAB — PROTEIN, BODY FLUID

## 2016-04-25 LAB — LACTATE DEHYDROGENASE: LDH: 138 U/L (ref 98–192)

## 2016-04-25 LAB — GLUCOSE, SEROUS FLUID: GLUCOSE FL: 141 mg/dL

## 2016-04-25 LAB — CHOLESTEROL, TOTAL: CHOLESTEROL: 76 mg/dL (ref 0–200)

## 2016-04-25 LAB — LACTIC ACID, PLASMA: Lactic Acid, Venous: 0.9 mmol/L (ref 0.5–1.9)

## 2016-04-25 LAB — PROCALCITONIN: Procalcitonin: 3.65 ng/mL

## 2016-04-25 LAB — LACTATE DEHYDROGENASE, PLEURAL OR PERITONEAL FLUID: LD, Fluid: 253 U/L — ABNORMAL HIGH (ref 3–23)

## 2016-04-25 LAB — TRIGLYCERIDES: TRIGLYCERIDES: 90 mg/dL (ref <150)

## 2016-04-25 MED ORDER — ORAL CARE MOUTH RINSE
15.0000 mL | Freq: Four times a day (QID) | OROMUCOSAL | Status: DC
  Administered 2016-04-25 – 2016-05-10 (×60): 15 mL via OROMUCOSAL

## 2016-04-25 MED ORDER — SODIUM CHLORIDE 0.9 % IV SOLN
30.0000 meq | Freq: Once | INTRAVENOUS | Status: AC
  Administered 2016-04-25: 30 meq via INTRAVENOUS
  Filled 2016-04-25: qty 15

## 2016-04-25 MED ORDER — LORAZEPAM 2 MG/ML IJ SOLN
1.0000 mg | Freq: Once | INTRAMUSCULAR | Status: AC
  Administered 2016-04-25: 1 mg via INTRAVENOUS
  Filled 2016-04-25: qty 1

## 2016-04-25 MED ORDER — FENTANYL CITRATE (PF) 100 MCG/2ML IJ SOLN
50.0000 ug | INTRAMUSCULAR | Status: DC | PRN
  Administered 2016-04-26 – 2016-04-27 (×7): 50 ug via INTRAVENOUS
  Filled 2016-04-25 (×5): qty 2

## 2016-04-25 MED ORDER — MIDAZOLAM HCL 2 MG/2ML IJ SOLN
INTRAMUSCULAR | Status: AC
  Administered 2016-04-25: 2 mg
  Filled 2016-04-25: qty 2

## 2016-04-25 MED ORDER — PROPOFOL 1000 MG/100ML IV EMUL
0.0000 ug/kg/min | INTRAVENOUS | Status: DC
  Administered 2016-04-25 (×2): 25 ug/kg/min via INTRAVENOUS
  Administered 2016-04-25: 10 ug/kg/min via INTRAVENOUS
  Administered 2016-04-26 (×3): 35 ug/kg/min via INTRAVENOUS
  Administered 2016-04-27: 30 ug/kg/min via INTRAVENOUS
  Administered 2016-04-27: 35 ug/kg/min via INTRAVENOUS
  Filled 2016-04-25 (×8): qty 100

## 2016-04-25 MED ORDER — PHENYLEPHRINE HCL 10 MG/ML IJ SOLN
30.0000 ug/min | INTRAVENOUS | Status: DC
  Administered 2016-04-25: 45 ug/min via INTRAVENOUS
  Filled 2016-04-25: qty 1

## 2016-04-25 MED ORDER — SODIUM CHLORIDE 0.9 % IV SOLN
750.0000 mL | INTRAVENOUS | Status: DC | PRN
  Administered 2016-04-25 (×2): 750 mL via INTRAVENOUS

## 2016-04-25 MED ORDER — LORAZEPAM 2 MG/ML IJ SOLN: 0.5000 mg | Freq: Once | INTRAMUSCULAR | Status: DC

## 2016-04-25 MED ORDER — FUROSEMIDE 10 MG/ML IJ SOLN: 40.0000 mg | Freq: Three times a day (TID) | INTRAMUSCULAR | Status: DC

## 2016-04-25 MED ORDER — ETOMIDATE 2 MG/ML IV SOLN
20.0000 mg | Freq: Once | INTRAVENOUS | Status: AC
  Administered 2016-04-25: 20 mg via INTRAVENOUS

## 2016-04-25 MED ORDER — CHLORHEXIDINE GLUCONATE 0.12% ORAL RINSE (MEDLINE KIT)
15.0000 mL | Freq: Two times a day (BID) | OROMUCOSAL | Status: DC
  Administered 2016-04-25 – 2016-05-10 (×32): 15 mL via OROMUCOSAL

## 2016-04-25 MED ORDER — SODIUM CHLORIDE 0.9 % IV SOLN
100.0000 mg | INTRAVENOUS | Status: DC
  Administered 2016-04-26 – 2016-05-06 (×11): 100 mg via INTRAVENOUS
  Filled 2016-04-25 (×12): qty 100

## 2016-04-25 MED ORDER — SODIUM CHLORIDE 0.9 % IV SOLN
200.0000 mg | Freq: Once | INTRAVENOUS | Status: AC
  Administered 2016-04-25: 200 mg via INTRAVENOUS
  Filled 2016-04-25: qty 200

## 2016-04-25 MED ORDER — ROCURONIUM BROMIDE 50 MG/5ML IV SOLN
1.0000 mg/kg | Freq: Once | INTRAVENOUS | Status: AC
  Administered 2016-04-25: 50 mg via INTRAVENOUS

## 2016-04-25 MED ORDER — FAMOTIDINE IN NACL 20-0.9 MG/50ML-% IV SOLN
20.0000 mg | Freq: Two times a day (BID) | INTRAVENOUS | Status: DC
  Administered 2016-04-25 – 2016-05-12 (×35): 20 mg via INTRAVENOUS
  Filled 2016-04-25 (×35): qty 50

## 2016-04-25 MED ORDER — PHENYLEPHRINE HCL 10 MG/ML IJ SOLN
30.0000 ug/min | INTRAVENOUS | Status: DC
  Administered 2016-04-25 (×2): 95 ug/min via INTRAVENOUS
  Administered 2016-04-26: 105 ug/min via INTRAVENOUS
  Administered 2016-04-26: 115 ug/min via INTRAVENOUS
  Administered 2016-04-27: 70 ug/min via INTRAVENOUS
  Administered 2016-04-27: 100 ug/min via INTRAVENOUS
  Administered 2016-04-28: 70 ug/min via INTRAVENOUS
  Administered 2016-04-28: 45 ug/min via INTRAVENOUS
  Filled 2016-04-25 (×10): qty 4

## 2016-04-25 MED ORDER — SODIUM CHLORIDE 0.9 % IV SOLN
Freq: Once | INTRAVENOUS | Status: AC
  Administered 2016-04-25: 10:00:00 via INTRAVENOUS

## 2016-04-25 MED ORDER — FUROSEMIDE 10 MG/ML IJ SOLN
40.0000 mg | INTRAMUSCULAR | Status: AC
  Administered 2016-04-25: 40 mg via INTRAVENOUS

## 2016-04-25 MED ORDER — FENTANYL CITRATE (PF) 100 MCG/2ML IJ SOLN
50.0000 ug | INTRAMUSCULAR | Status: DC | PRN
  Administered 2016-04-25 – 2016-04-26 (×2): 50 ug via INTRAVENOUS
  Filled 2016-04-25 (×5): qty 2

## 2016-04-25 MED ORDER — FENTANYL CITRATE (PF) 100 MCG/2ML IJ SOLN
INTRAMUSCULAR | Status: AC
  Administered 2016-04-25: 100 ug
  Filled 2016-04-25: qty 2

## 2016-04-25 NOTE — Progress Notes (Signed)
PULMONARY / CRITICAL CARE MEDICINE   Name: Kristina Meyer MRN: [id number] DOB: [birth year]    ADMISSION DATE:  04/23/2016 CONSULTATION DATE:  04/24/16  REFERRING MD:  Dr Carolin Sicks, [facility]  REASON FOR CONSULT: HCAP, neutropenic sepsis SUBJECTIVE:  Working hard to breath   VITAL SIGNS: BP (!) 108/48   Pulse 99   Temp (!) 100.8 F (38.2 C)   Resp (!) 29   Ht 5\' 2"  (1.575 m)   Wt 147 lb 4.8 oz (66.8 kg)   SpO2 100%   BMI 26.94 kg/m   HEMODYNAMICS:    VENTILATOR SETTINGS: FiO2 (%):  [60 %-100 %] 60 %  INTAKE / OUTPUT: I/O last 3 completed shifts: In: 3283.3 [P.O.:240; I.V.:1943.3; IV Piggyback:1100] Out: 0   PHYSICAL EXAMINATION: General:  Ill appearing elderly woman, has some increase WOB  Neuro:  Awake, interacting appropriately, non-focal generalized weakness HEENT:  OP clear, PERRL, BIPAP in place  Cardiovascular:  Tachycardic, regular Lungs:  B expiratory rhonchi, decreased on left + accessory use  Abdomen:  Benign no OM + bowel sounds Musculoskeletal:  No edema Skin:  No rash or mottling   LABS:  BMET  Recent Labs Lab 04/23/16 0343 04/24/16 0523 04/25/16 0511  NA 136 137 135  K 3.1* 3.3* 3.2*  CL 100* 106 108  CO2 26 25 21*  BUN 24* 31* 21*  CREATININE 1.03* 0.85 0.74  GLUCOSE 157* 142* 145*    Electrolytes  Recent Labs Lab 04/23/16 0343 04/24/16 0523 04/25/16 0511  CALCIUM 8.0* 7.5* 7.0*  MG 2.0  --   --     CBC  Recent Labs Lab 04/23/16 1622 04/24/16 0523 04/25/16 0511  WBC 3.0* 1.7* 0.5*  HGB 8.9* 8.7* 7.5*  HCT 25.5* 25.0* 21.8*  PLT 21* 16* 15*    Coag's No results for input(s): APTT, INR in the last 168 hours.  Sepsis Markers  Recent Labs Lab 04/23/16 0358 04/24/16 1515 04/24/16 1519 04/25/16 0511  LATICACIDVEN 0.82  --  1.1 0.9  PROCALCITON  --  3.70  --  3.65    ABG No results for input(s): PHART, PCO2ART, PO2ART in the last 168 hours.  Liver Enzymes  Recent Labs Lab 04/23/16 0343 04/24/16 0523  AST 19 23   ALT 17 18  ALKPHOS 74 65  BILITOT 1.4* 1.8*  ALBUMIN 3.7 3.1*    Cardiac Enzymes No results for input(s): TROPONINI, PROBNP in the last 168 hours.  Glucose No results for input(s): GLUCAP in the last 168 hours.  Imaging Ct Angio Chest Pe W Or Wo Contrast  Result Date: 04/24/2016 CLINICAL DATA:  Chest pain EXAM: CT ANGIOGRAPHY CHEST WITH CONTRAST TECHNIQUE: Multidetector CT imaging of the chest was performed using the standard protocol during bolus administration of intravenous contrast. Multiplanar CT image reconstructions and MIPs were obtained to evaluate the vascular anatomy. CONTRAST:  100 mL Isovue 370 intravenous COMPARISON:  Chest x-ray 04/24/2016 FINDINGS: Cardiovascular: No definite filling defects within the central or segmental pulmonary arteries to suggest an acute embolus. Distal right lower lobe pulmonary arteries are obscured by respiratory motion artifact. There is no evidence for aortic dissection. There is atherosclerosis of the aorta. Mild coronary artery calcification. No significant pericardial effusion. Mediastinum/Nodes: Trachea and mainstem bronchi appear within normal limits. Small hypodense nodules within the thyroid gland. Mild air distention of the esophagus with questionable mid esophageal thickening. No significantly enlarged mediastinal lymph nodes. Lungs/Pleura: Moderate left-sided pleural effusion. Partial consolidation in the right lower lobe could reflect pneumonia. Dense left lower  lobe consolidation and partial consolidation in the lingula, may also reflect pneumonia. Mild right apical infiltrate or scar. No pneumothorax. Upper Abdomen: No acute abnormality. Musculoskeletal: Degenerative changes of the spine. Artifact creates spurious appearance of sternal fracture. Irregularity of left antero lateral ribs is suspected to be due to artifact rather than fracture. Review of the MIP images confirms the above findings. IMPRESSION: 1. No definite CT evidence for  acute pulmonary embolus or aortic dissection. 2. Moderate large left pleural effusion. Dense left lower lobe consolidation with partial consolidation in the lingula and right lower lobe which may represent pneumonia. 3. Mild mid esophageal thickening. Electronically Signed   By: Donavan Foil M.D.   On: 04/24/2016 22:01     STUDIES:  CT chest 11/28 >> 1. No definite CT evidence for acute pulmonary embolus or aortic dissection. 2. Moderate large left pleural effusion. Dense left lower lobe consolidation with partial consolidation in the lingula and right lower lobe which may represent pneumonia. 3. Mild mid esophageal thickening  CULTURES: Blood 11/27 >>  Urine 11/27 >> insignificant growth Blood 11/28 >>  Urine 11/28 >>  Viral resp panel 11/28 >>   ANTIBIOTICS: Cefepime 11/27 > 11/28 azithro 11/27 >>  vanco 11/28 >>  meropenem 11/28 >>    SIGNIFICANT EVENTS:  LINES/TUBES: Port-s-cath >>   ASSESSMENT / PLAN:  PULMONARY A: Acute hypoxic respiratory failure Left sided effusion (suspect parapneumonic) LLL HCAP based on presentation and CXR Chest pain P:   Cycle BIPAP w/ high flow High risk for intubation-->if intubated would get BAL Diagnostic/therpeutic thora today See Id sections   CARDIOVASCULAR A:  SIRS/sepsis (neutropenic) Transient hypotension, improved w IVF P:  Telemetry Hold home HCTZ; careful w norflex, xanax Consider CVC placement to obtain CVP's if refractory hypotension. She does have adequate access for pressors if they become necessary  abx as below   RENAL A:   Mild acute renal insufficiency, improved w volume Hypokalemia  P:   Replace K Follow BMP and UOP  GASTROINTESTINAL A:   SUP Nutrition Nausea, improved P:   Defer pepcid for now given thrombocytopenia.   HEMATOLOGIC / ONCOLOGIC A:   AML receiving treatment, last 1 weeks PTA Thrombocytopenia Anemia s/p PRBC 11/27 Leukopenia, ANC on 11/27 > 1500 P:  Transfuse platelets for  thoracentesis later this am  Follow CBC + diff Follow for evidence active bleeding  INFECTIOUS A:   Suspected LLL HCAP R/o parapneumonic effusion  Severe sepsis without shock P:   Agree with broad abx, continue imipenem, vanc and azith; will narrow as able. Follow cx data Will send pleural fluid for analysis as well  ENDOCRINE A:   No hx DM P:   Follow glucose on BMP  NEUROLOGIC A:   Anxiety / depression P:   RASS goal: 0 Continue home Paxil  Careful with xanax    FAMILY  - Updates: No family at bedside, plans discussed with the patient.   - Inter-disciplinary family meet or Palliative Care meeting due by:  12/5   Neutropenic sepsis in setting of dense LLL PNA. C/b worsening hypoxic respiratory failure, large left effusion & pancytopenia. She is on broad spec abx & NIPPV. Her wob is concerning but she seems to be holding her own currently. Immediate plan is to transfuse platelets and go ahead w/ diagnostic/therapeutic left thoracentesis. Other than that plan is supportive in nature and includes: cont abx, f/u culture data, replace lytes and watch for hemodynamic decline. I have spoken w/ her husband and son at  bedside. Explained current plan of care and also discussed my concern that things could still yet decline. She would not want prolonged life support but would be open to short term ventilation if there was hope that we could get her thru the pneumonia. She may get worse before she gets better.   My critical care time 45 minutes  [name] ACNP-BC [place] Pager # [phone] OR # [phone] if no answer

## 2016-04-25 NOTE — Procedures (Signed)
Intubation Procedure Note Kristina Meyer [id number] 10/16/1940  Procedure: Intubation Indications: Respiratory insufficiency  Procedure Details Consent: Unable to obtain consent because of emergent medical necessity. Time Out: Verified patient identification, verified procedure, site/side was marked, verified correct patient position, special equipment/implants available, medications/allergies/relevent history reviewed, required imaging and test results available.  Performed  Maximum sterile technique was used including cap, gloves, gown, hand hygiene and mask.  MAC 3 glidescope    Evaluation Hemodynamic Status: BP stable throughout; O2 sats: perssstnetly in 80s pre and peri intubation Patient's Current Condition: stable Complications: No apparent complications Patient did tolerate procedure well. Chest X-ray ordered to verify placement.  CXR: pending.   Kristina Meyer 04/25/2016

## 2016-04-25 NOTE — Progress Notes (Addendum)
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  Broad abx were started on admission for sepsis and PNA.     Today, 04/25/2016: - day #2 vancomycin, azithromycin and meropenem - starting Eraxis today for empiric coverage - Tmax 103.5, wbc low and trending down, ANC 0.3 (now on neupogen)  - scr stable (crcl~52) -  PCT 3.65   Plan: - continue Vancomycin  500mg  IV q12h - continue meropenem 1gm IV q8h - start Eraxis 200 mg IV x1, then 100 mg IV daily - f/u cultures, renal funct, clinical status  ___________________________  Height: 5\' 2"  (157.5 cm) Weight: 147 lb 4.8 oz (66.8 kg) IBW/kg (Calculated) : 50.1  Temp (24hrs), Avg:101.5 F (38.6 C), Min:99.5 F (37.5 C), Max:103.5 F (39.7 C)   Recent Labs Lab 04/23/16 0343 04/23/16 0358 04/23/16 1622 04/24/16 0523 04/24/16 1519 04/25/16 0511  WBC 2.1*  --  3.0* 1.7*  --  0.5*  CREATININE 1.03*  --   --  0.85  --  0.74  LATICACIDVEN  --  0.82  --   --  1.1 0.9    Estimated Creatinine Clearance: 52.8 mL/min (by C-G formula based on SCr of 0.74 mg/dL).    Allergies  Allergen Reactions  . Other Other (See Comments)    GENERAL Anesthesia, vomiting    Antimicrobials this admission:  11/27  cefepime >> 11/28 11/28 Meropenem >> 11/27 azithromycin >>  11/29 Eraxis (empiric)>>  Dose adjustments this admission:  n/a  Microbiology results:  11/27 BCx x2:  11/27 UCx: insignificant growth 11/27: Legionella antigen: neg 11/27: Strep antigen: neg 11/28 MRSA PCR (-) 11/28 bcx x2:   Thank you for allowing pharmacy to be a part of this patient's care.  Dia Sitter, PharmD, BCPS 04/25/2016 10:17 AM

## 2016-04-25 NOTE — Procedures (Signed)
Central Venous Catheter Insertion Procedure Note Kristina Meyer [id number] 1941-01-16  Procedure: Insertion of Central Venous Catheter Indications: Assessment of intravascular volume, Drug and/or fluid administration and Frequent blood sampling  Procedure Details Consent: Risks of procedure as well as the alternatives and risks of each were explained to the (patient/caregiver).  Consent for procedure obtained. Time Out: Verified patient identification, verified procedure, site/side was marked, verified correct patient position, special equipment/implants available, medications/allergies/relevent history reviewed, required imaging and test results available.  Performed Real time [place] was used to ID and cannulate the vessel   Maximum sterile technique was used including antiseptics, cap, gloves, gown, hand hygiene, mask and sheet. Skin prep: Chlorhexidine; local anesthetic administered A antimicrobial bonded/coated triple lumen catheter was placed in the left internal jugular vein using the Seldinger technique.  Evaluation Blood flow good Complications: No apparent complications Patient did tolerate procedure well. Chest X-ray ordered to verify placement.  CXR: pending.  Kristina Meyer 04/25/2016, 6:33 PM  Kristina Meyer ACNP-BC [place] Pager # [phone] OR # [phone] if no answer

## 2016-04-25 NOTE — Progress Notes (Signed)
Post intubation hypotension ->suspect r/t diprivan  Add neo for now Will likely place CVL If so will ck CVP to guide pressors  [name] ACNP-BC [place] Pager # [phone] OR # [phone] if no answer

## 2016-04-25 NOTE — Progress Notes (Signed)
Date:  April 25, 2016 Chart reviewed for concurrent status and case management needs. Will continue to follow patient progress.  Remains with tempo 102.3, wbc0.3 and bipap Discharge Planning: following for needs Expected discharge date: [id number] Kristina Meyer, [place], [place], [place]

## 2016-04-25 NOTE — Progress Notes (Signed)
Unfortunately, [name] is now down the ICU. She is on BiPAP. She is decompensating with respect to her pulmonary status.  We started her on Neupogen yesterday. Her white cell count is 0.5. This all has to be the effect from chemotherapy. She is at her nadir. We have to try to get her through this if we can.  I have to believe that she has responded to treatment for the leukemia.  Patient had a CT scan done yesterday. This was a CT angiogram done. This showed a moderately large left effusion. I agree with Dr. Ashok Cordia that a thoracentesis should be done.  Her hemoglobin is 7.5. We may have to consider transfusing her. I don't think she is bleeding. Her platelet count is 15,000. We might be able to hold off on a platelet transfusion.  Her renal function is doing okay.  So far her cultures are negative.  She is on Merrem and vancomycin. She also is on Zithromax. We may need to consider adding antifungal coverage. Her temperature is 103.5. Her blood pressure is 108/48. Pulse is 112. She is somewhat sedated.  This is a really tough situation. Hopefully, she will be elevated to pull through this. I think we get her white cell count back up, then her own immune system can fight this.  I appreciate everybody's help down in the ICU. I know that you all working real hard to try to help her get through this.  She is done so well up to now. Again, I have to believe that her leukemia is responding. I suppose that we may have to consider doing a bone marrow biopsy on her to see we'll count response we've had. I don't think that that is important at this point.  We will have to follow along very closely.  Lattie Haw, MD  Darlyn Chamber 17:14

## 2016-04-25 NOTE — Progress Notes (Signed)
PCCM Attending Note: Per nursing report patient became acutely more dyspneic and tachycardic after attempting to get up to bedside commode. EKG reviewed showing sinus tachycardia. Patient did receive 1 dose of IV Ativan to help with anxiety. Prior to acute decompensation patient was on 2 L/m via nasal cannula. Patient switched over to BiPAP for work of breathing and is now down to FiO2 0.7 with a steadily improving respiratory rate. Patient reports she feels her breathing is improving as well. Bedside RN to establish additional PIV access in case medications need to be administered. Continued close monitoring. Hopefully will be able to break off of BiPAP in a couple of hours as the patient recovers. Low threshold for intubation if needed. Recommend thoracentesis versus percutaneous chest tube this morning. Holding off at this time to avoid moving the patient and halt the progress she is making.  Sonia Baller Ashok Cordia, M.D. [facility] Pulmonary [facility] Critical [facility] Pager:  [phone] After 3pm or if no response, call [phone] 4:36 AM 04/25/16

## 2016-04-25 NOTE — Procedures (Signed)
Thoracentesis Procedure Note  Pre-operative Diagnosis: pleural effusion   Post-operative Diagnosis: same  Indications: evaluation of pleural fluid   Procedure Details  Consent: Informed consent was obtained. Risks of the procedure were discussed including: infection, bleeding, pain, pneumothorax.  Under sterile conditions the patient was positioned. Betadine solution and sterile drapes were utilized.  1% buffered lidocaine was used to anesthetize the  rib space which was identified via realtime [place]. Fluid was obtained without any difficulties and minimal blood loss.  A dressing was applied to the wound and wound care instructions were provided.   Findings 500 ml of bloody pleural fluid was obtained. A sample was sent to Pathology for cytogenetics, flow, and cell counts, as well as for infection analysis. There was still a rather large fluid identified via Ultrasound located posteriorly in the left chest.   Complications:  None; patient tolerated the procedure well.          Condition: stable  Plan A follow up chest x-ray was ordered. Bed Rest for 0 hours. Tylenol 650 mg. for pain.  [name] ACNP-BC [place] Pager # [phone] OR # [phone] if no answer

## 2016-04-25 NOTE — Progress Notes (Signed)
Initial Nutrition Assessment  DOCUMENTATION CODES:   Not applicable  INTERVENTION:  - If TF warranted/desired, recommend Jevity 1.2 @ 60 mL/hr. This regimen + kcal from starting Propofol rate will provide 1834 kcal, 80 grams of protein, and 1162 mL free water.  - RD will follow-up 11/30.  NUTRITION DIAGNOSIS:   Inadequate oral intake related to inability to eat as evidenced by NPO status.  GOAL:   Patient will meet greater than or equal to 90% of their needs  MONITOR:   Vent status, Weight trends, Labs, I & O's  REASON FOR ASSESSMENT:   Malnutrition Screening Tool, Ventilator  ASSESSMENT:   75 y.o. 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 The patient was in her usual state of health, got her Cytovene infusions last last Tuesday, and then over the last 3 or 4 days has developed progressive weakness and "just feeling awful". At first she thought she was just getting anemic again because of how tired she was over the weekend, but then tonight at 2AM she woke with fever to 102F, vomited, had worse malaise and chills and came to the ER.   She has had no dysuria or other urinary irritative symptoms.  She has had a new nonproductive cough this week.  No nose bleeds, bleeding from gums.  Pt seen for MST and new vent; pt was on BiPAP at time of RD visit earlier this afternoon and was intubated shortly after. BMI indicates overweight status, appropriate for age. Spoke with pt's husband at bedside at time of earlier visit. He states that pt began to decline from her usual state of health on Sunday (11/26) early AM or Monday (11/27) early AM. Prior to that, she had a good appetite and was eating well and she had not mentioned any taste changes/alterations while undergoing chemo. Pt's last chemo was 04/17/16.   Physical assessment shows no muscle or fat wasting at this time. Per chart review, weight stable 01/05/16-04/09/16  (135-137 lbs). Will use admission weight of 134 lbs (61 kg) to estimate nutrition needs as weight later in the day of admission is +5.8 kg from admission weight. Per rounds this AM, plan was for thoracentesis today; will monitor.   Patient is currently intubated on ventilator support with OGT in place.  MV: 12.1 L/min Temp (24hrs), Avg:101.5 F (38.6 C), Min:99.5 F (37.5 C), Max:103.5 F (39.7 C) Propofol: 4 ml/hr (106 kcal). --not yet started but confirmed starting rate with RN; rate may increase throughout the day.  BP: 98/49 and MAP: 65.  Medications reviewed; 20 mg IV Pepcid BID, 40 mg IV Lasix TID, PRN Zofran, 30 mEq IV KCl x1 dose today. Labs reviewed; K: 3.2 mmol/L, Ca: 7 mg/dL, no Mg lab today.   Drip: Propofol @ 10 mcg/kg/min.   Diet Order:  Diet NPO time specified  Skin:  Reviewed, no issues  Last BM:  11/26  Height:   Ht Readings from Last 1 Encounters:  04/23/16 5\' 2"  (1.575 m)    Weight:   Wt Readings from Last 1 Encounters:  04/23/16 147 lb 4.8 oz (66.8 kg)    Ideal Body Weight:  50 kg  BMI:  Body mass index is 26.94 kg/m.  Estimated Nutritional Needs:   Kcal:  [id number]  Protein:  73-92 grams (1.2-1.5 grams/kg)  Fluid:  1.8 L/day  EDUCATION NEEDS:   No education needs identified at this time    Jarome Matin, MS, RD,  LDN, CNSC Inpatient Clinical Dietitian Pager # [phone] After hours/weekend pager # [phone]

## 2016-04-26 ENCOUNTER — Inpatient Hospital Stay (HOSPITAL_COMMUNITY): Payer: Medicare Other

## 2016-04-26 LAB — COMPREHENSIVE METABOLIC PANEL
ALBUMIN: 2.4 g/dL — AB (ref 3.5–5.0)
ALK PHOS: 69 U/L (ref 38–126)
ALT: 13 U/L — ABNORMAL LOW (ref 14–54)
AST: 17 U/L (ref 15–41)
Anion gap: 7 (ref 5–15)
BILIRUBIN TOTAL: 1.3 mg/dL — AB (ref 0.3–1.2)
BUN: 19 mg/dL (ref 6–20)
CO2: 23 mmol/L (ref 22–32)
Calcium: 6.8 mg/dL — ABNORMAL LOW (ref 8.9–10.3)
Chloride: 108 mmol/L (ref 101–111)
Creatinine, Ser: 0.78 mg/dL (ref 0.44–1.00)
GFR calc Af Amer: >60 mL/min (ref >60)
GFR calc non Af Amer: >60 mL/min (ref >60)
GLUCOSE: 139 mg/dL — AB (ref 65–99)
POTASSIUM: 3 mmol/L — AB (ref 3.5–5.1)
Sodium: 138 mmol/L (ref 135–145)
TOTAL PROTEIN: 5.1 g/dL — AB (ref 6.5–8.1)

## 2016-04-26 LAB — GLUCOSE, CAPILLARY
GLUCOSE-CAPILLARY: 115 mg/dL — AB (ref 65–99)
Glucose-Capillary: 104 mg/dL — ABNORMAL HIGH (ref 65–99)
Glucose-Capillary: 123 mg/dL — ABNORMAL HIGH (ref 65–99)
Glucose-Capillary: 168 mg/dL — ABNORMAL HIGH (ref 65–99)

## 2016-04-26 LAB — PREPARE PLATELET PHERESIS
UNIT DIVISION: 0
UNIT DIVISION: 0
Unit division: 0

## 2016-04-26 LAB — CBC
HEMATOCRIT: 20.6 % — AB (ref 36.0–46.0)
HEMOGLOBIN: 7.2 g/dL — AB (ref 12.0–15.0)
MCH: 31.9 pg (ref 26.0–34.0)
MCHC: 35 g/dL (ref 30.0–36.0)
MCV: 91.2 fL (ref 78.0–100.0)
Platelets: 78 10*3/uL — ABNORMAL LOW (ref 150–400)
RBC: 2.26 MIL/uL — ABNORMAL LOW (ref 3.87–5.11)
RDW: 19.1 % — ABNORMAL HIGH (ref 11.5–15.5)
WBC: 0.5 10*3/uL — AB (ref 4.0–10.5)

## 2016-04-26 LAB — PROCALCITONIN: PROCALCITONIN: 5.22 ng/mL

## 2016-04-26 LAB — PH, BODY FLUID: pH, Body Fluid: 7.7

## 2016-04-26 LAB — PHOSPHORUS: Phosphorus: 1.4 mg/dL — ABNORMAL LOW (ref 2.5–4.6)

## 2016-04-26 LAB — PREPARE RBC (CROSSMATCH)

## 2016-04-26 LAB — MAGNESIUM: Magnesium: 1.7 mg/dL (ref 1.7–2.4)

## 2016-04-26 MED ORDER — PAROXETINE HCL 20 MG PO TABS
20.0000 mg | ORAL_TABLET | Freq: Every day | ORAL | Status: DC
  Administered 2016-04-26 – 2016-04-29 (×4): 20 mg
  Filled 2016-04-26 (×4): qty 1

## 2016-04-26 MED ORDER — ATORVASTATIN CALCIUM 10 MG PO TABS
10.0000 mg | ORAL_TABLET | Freq: Every day | ORAL | Status: DC
  Administered 2016-04-26 – 2016-05-15 (×19): 10 mg
  Filled 2016-04-26 (×19): qty 1

## 2016-04-26 MED ORDER — FUROSEMIDE 10 MG/ML IJ SOLN: 10.0000 mg | Freq: Once | INTRAMUSCULAR | Status: DC

## 2016-04-26 MED ORDER — POTASSIUM PHOSPHATES 15 MMOLE/5ML IV SOLN
30.0000 mmol | Freq: Once | INTRAVENOUS | Status: AC
  Administered 2016-04-26: 30 mmol via INTRAVENOUS
  Filled 2016-04-26: qty 10

## 2016-04-26 MED ORDER — SODIUM CHLORIDE 0.9 % IV SOLN
Freq: Once | INTRAVENOUS | Status: AC
  Administered 2016-04-26: 12:00:00 via INTRAVENOUS

## 2016-04-26 MED ORDER — FILGRASTIM 480 MCG/1.6ML IJ SOLN
780.0000 ug | Freq: Every day | INTRAMUSCULAR | Status: DC
  Administered 2016-04-26 – 2016-04-29 (×4): 780 ug via SUBCUTANEOUS
  Filled 2016-04-26 (×8): qty 3.2

## 2016-04-26 MED ORDER — MAGNESIUM SULFATE 2 GM/50ML IV SOLN
2.0000 g | Freq: Once | INTRAVENOUS | Status: AC
  Administered 2016-04-26: 2 g via INTRAVENOUS
  Filled 2016-04-26: qty 50

## 2016-04-26 MED ORDER — POTASSIUM CHLORIDE 20 MEQ/15ML (10%) PO SOLN
40.0000 meq | Freq: Once | ORAL | Status: AC
  Administered 2016-04-26: 40 meq
  Filled 2016-04-26: qty 30

## 2016-04-26 NOTE — Progress Notes (Signed)
Date:  April 26, 2016 Chart reviewed for concurrent status and case management needs. Will continue to follow patient progress. Intubated pm of [id number] Discharge Planning: following for needs Expected discharge date: [id number] Velva Harman, BSN, [place], [place]

## 2016-04-26 NOTE — Progress Notes (Signed)
Nutrition Follow-up  DOCUMENTATION CODES:   Not applicable  INTERVENTION:  - If TF warranted/desired, recommend Jevity 1.2 @ 25 mL/hr to increase by 10 mL every 6 hours to reach goal rate of Jevity 1.2 @ 55 mL/hr. At goal rate, Jevity 1.2 @ 55 mL/hr + kcal from current Propofol rate will provide 1795 kcal, 73 grams of protein, and 1065 mL free water.  - RD will follow-up 12/1  NUTRITION DIAGNOSIS:   Inadequate oral intake related to inability to eat as evidenced by NPO status. -ongoing  GOAL:   Patient will meet greater than or equal to 90% of their needs -unable to meet at this time.   MONITOR:   Vent status, Weight trends, Labs, I & O's  ASSESSMENT:   75 y.o. 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 The patient was in her usual state of health, got her Cytovene infusions last last Tuesday, and then over the last 3 or 4 days has developed progressive weakness and "just feeling awful". At first she thought she was just getting anemic again because of how tired she was over the weekend, but then tonight at 2AM she woke with fever to 102F, vomited, had worse malaise and chills and came to the ER.   She has had no dysuria or other urinary irritative symptoms.  She has had a new nonproductive cough this week.  No nose bleeds, bleeding from gums.  11/30 Pt with OGT in place; no plan for TF at this time as RN reports pt to go to CT later today. RD will follow-up tomorrow and monitor for nutrition-related plan at that time. No new weight since 11/27 and continue to use 61 kg to re-estimate kcal need this AM. Pt underwent thoracentesis yesterday PM with 500cc bloody pleural fluid removed at that time.   Patient is currently intubated on ventilator support MV: 9.3 L/min Temp (24hrs), Avg:102.4 F (39.1 C), Min:101.3 F (38.5 C), Max:103.6 F (39.8 C) Propofol: 8 ml/hr (211 kcal) BP: 106/43 and MAP: 64   Medications  reviewed; 20 mg IV Pepcid BID, 40 mg IV Lasix x1 dose yesterday, 2 g IV Mg sulfate x1 dose today, 30 mEq IV KCl x1 dose yesterday, 30 mmol KPhos x1 dose today Labs reviewed; CBGs: 104-168 mg/dL this AM, K: 3 mmol/L, Phos: 1.4 mg/dL, Ca: 6.8 mg/dL.  Drips: Propofol @ 12.3 mcg/kg/min, Neo @ 11.5 mcg/kg/hr.    11/29 - Pt was on BiPAP at time of RD visit earlier this afternoon and was intubated shortly after.  - Spoke with pt's husband at bedside at time of earlier visit.  - He states that pt began to decline from her usual state of health on Sunday (11/26) early AM or Monday (11/27) early AM.  - Prior to that, she had a good appetite and was eating well and she had not mentioned any taste changes/alterations while undergoing chemo.  - Pt's last chemo was 04/17/16.  - Physical assessment shows no muscle or fat wasting at this time.  - Per chart review, weight stable 01/05/16-04/09/16 (135-137 lbs).  - Will use admission weight of 134 lbs (61 kg) to estimate nutrition needs as weight later in the day of admission is +5.8 kg from admission weight.  - Per rounds this AM, plan was for thoracentesis today; will monitor.   Patient is currently intubated on ventilator support with OGT in place.  MV: 12.1 L/min Temp (24hrs), Avg:101.5 F (  38.6 C), Min:99.5 F (37.5 C), Max:103.5 F (39.7 C) Propofol: 4 ml/hr (106 kcal). --not yet started but confirmed starting rate with RN; rate may increase throughout the day.  BP: 98/49 and MAP: 65. Drip: Propofol @ 10 mcg/kg/min.   Diet Order:  Diet NPO time specified  Skin:  Reviewed, no issues  Last BM:  11/29  Height:   Ht Readings from Last 1 Encounters:  04/25/16 5\' 2"  (1.575 m)    Weight:   Wt Readings from Last 1 Encounters:  04/23/16 147 lb 4.8 oz (66.8 kg)    Ideal Body Weight:  50 kg  BMI:  Body mass index is 26.94 kg/m.  Estimated Nutritional Needs:   Kcal:  1735  Protein:  73-92 grams (1.2-1.5 grams/kg)  Fluid:  1.8  L/day  EDUCATION NEEDS:   No education needs identified at this time    Jarome Matin, MS, RD, LDN, CNSC Inpatient Clinical Dietitian Pager # [phone] After hours/weekend pager # [phone]

## 2016-04-26 NOTE — Progress Notes (Signed)
Patient ID: Kristina Meyer, female   DOB: 1941/03/08, 75 y.o.   MRN: [id number]    Chief Complaint: Patient was seen in consultation today for thoracostomy tube placement due to loculated left pleural effusion.   Referring Physician(s): Brand Males  Supervising Physician: Marybelle Killings  Patient Status: [facility] - [facility]  History of Present Illness: Kristina Meyer is a 75 y.o. female with a history of AML (receiving decitabine) and HTN who was referred to IR service for thoracostomy tube due to loculated left pleural, probably parapneumonic,effusion.She had a recent thoracentesis by CCM, yielding only 500cc with residual fluid remaining.  She is currently sedated and on ventilator. Febrile (103.5) with WBC of 0.5, Hgb 7.2, and platelets 78.  Past Medical History:  Diagnosis Date  . 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 Anxiety   . Heart murmur    echo done 20 yrs ago  normal  . Hypertension   . PONV (postoperative nausea and vomiting)    req patch    Past Surgical History:  Procedure Laterality Date  . BREAST CYST EXCISION Left 1970  . BREAST SURGERY Left 76   lump   . EYE SURGERY Bilateral 09   cataracts  . HEMORRHOID SURGERY  85  . IR GENERIC HISTORICAL  01/11/2016   IR US GUIDE VASC ACCESS RIGHT 01/11/2016 Jacqulynn Cadet, MD [facility]-INTERV RAD  . IR GENERIC HISTORICAL  01/11/2016   IR FLUORO GUIDE CV LINE RIGHT 01/11/2016 Jacqulynn Cadet, MD [facility]INTER[facility]  . LUMBAR LAMINECTOMY/DECOMPRESSION MICRODISCECTOMY Right 12/02/2013   Procedure: L5-S1 RIGHT DISCECTOMY;  Surgeon: Melina Schools, MD;  Location: [facility];  Service: Orthopedics;  Laterality: Right;    Allergies: Other  Medications: Prior to Admission medications   Medication Sig Start Date End Date Taking? Authorizing Provider  ALPRAZolam (XANAX) 0.5 MG tablet TAKE 1 TABLET BY MOUTH 3 TIMES A DAY AS NEEDED Patient taking differently: TAKE 1 TABLET BY MOUTH 3 TIMES A DAY AS NEEDED for anxiety. 03/26/16  Yes Volanda Napoleon,  MD  aspirin 81 MG tablet Take 81 mg by mouth daily.    Yes Historical Provider, MD  atorvastatin (LIPITOR) 20 MG tablet Take 10 mg by mouth at bedtime.   Yes Historical Provider, MD  Calcium-Magnesium-Vitamin D (CALCIUM 1200+D3 PO) Take 1 tablet by mouth daily.   Yes Historical Provider, MD  cholecalciferol (VITAMIN D) 1000 UNITS tablet Take 1,000 Units by mouth daily.   Yes Historical Provider, MD  hydrochlorothiazide (HYDRODIURIL) 25 MG tablet Take by mouth. 12/08/16  Yes Historical Provider, MD  lidocaine-prilocaine (EMLA) cream Apply 1 application topically as needed. Place quarter sized amount of cream directly on portacath and cover with saran wrap at least 1 - 1 1/2 hours prior to procedure. 01/11/16  Yes Volanda Napoleon, MD  Multiple Vitamins-Minerals (MULTIVITAMIN ADULT) TABS Take 1 tablet by mouth daily.   Yes Historical Provider, MD  orphenadrine (NORFLEX) 100 MG tablet Take 1 tablet (100 mg total) by mouth at bedtime as needed for muscle spasms. 12/29/15  Yes Volanda Napoleon, MD  PARoxetine (PAXIL) 20 MG tablet Take 20 mg by mouth daily.   Yes Historical Provider, MD  potassium chloride SA (K-DUR,KLOR-CON) 20 MEQ tablet Take 1 pill twice a day for 7 days, then 1 pill a day. 02/13/16  Yes Volanda Napoleon, MD  prochlorperazine (COMPAZINE) 5 MG tablet Take 1 tablet (5 mg total) by mouth every 6 (six) hours as needed for nausea or vomiting. 01/05/16  Yes Volanda Napoleon,  MD  sulfamethoxazole-trimethoprim (BACTRIM DS) 800-160 MG per tablet Take 1 tablet by mouth daily.   Yes Historical Provider, MD     Family History  Problem Relation Age of Onset  . Heart failure Mother   . Depression Mother   . Heart Problems Father   . Leukemia Cousin     Social History   Social History  . Marital status: Married    Spouse name: N/A  . Number of children: N/A  . Years of education: N/A   Social History Main Topics  . Smoking status: Former Smoker    Packs/day: 0.50    Years: 5.00    Types:  Cigarettes    Quit date: 12/02/1966  . Smokeless tobacco: Never Used  . Alcohol use No  . Drug use: No  . Sexual activity: Not Asked   Other Topics Concern  . None   Social History Narrative  . None    Review of Systems  Patient intubated and nonresponsive  Vital Signs: BP (!) 108/55   Pulse (!) 101   Temp (!) 102.6 F (39.2 C)   Resp (!) 21   Ht 5\' 2"  (1.575 m)   Wt 147 lb 4.8 oz (66.8 kg)   SpO2 100%   BMI 26.94 kg/m   Physical Exam Nonresponsive, sedated on ventilator. Orally intubated. CV: Tachy, regular. Resp: Decreased on left, scatt rhonchi.  Abdomen soft, nondistended. No lower extremity edema.   Mallampati Score:     Imaging: Dg Chest 2 View  Result Date: 04/24/2016 CLINICAL DATA:  Followup pneumonia. Left-sided chest pain and fever. EXAM: CHEST  2 VIEW COMPARISON:  04/23/2016 FINDINGS: Power port appears unchanged. Right lung remains clear. There is progressive infiltrate throughout the left lower lobe with only mild volume loss. No acute bone finding. IMPRESSION: Left lower lobe pneumonia is progressive since yesterday's film. Electronically Signed   By: Nelson Chimes M.D.   On: 04/24/2016 09:08   Dg Chest 2 View  Result Date: 04/23/2016 CLINICAL DATA:  Constant generalized body aches for 3 days. Vomiting, appetite change, weakness, fatigue, and fever. Mild cough. EXAM: CHEST  2 VIEW COMPARISON:  None. FINDINGS: Normal heart size and pulmonary vascularity. Consolidation in the left lung base behind the heart likely representing pneumonia. Right lung is clear. Power port type central venous catheter with tip over the low SVC region. No pneumothorax. Degenerative changes in the spine. IMPRESSION: Consolidation in the left lung base likely representing pneumonia. Electronically Signed   By: Lucienne Capers M.D.   On: 04/23/2016 03:28   Ct Angio Chest Pe W Or Wo Contrast  Result Date: 04/24/2016 CLINICAL DATA:  Chest pain EXAM: CT ANGIOGRAPHY CHEST WITH CONTRAST  TECHNIQUE: Multidetector CT imaging of the chest was performed using the standard protocol during bolus administration of intravenous contrast. Multiplanar CT image reconstructions and MIPs were obtained to evaluate the vascular anatomy. CONTRAST:  100 mL Isovue 370 intravenous COMPARISON:  Chest x-ray 04/24/2016 FINDINGS: Cardiovascular: No definite filling defects within the central or segmental pulmonary arteries to suggest an acute embolus. Distal right lower lobe pulmonary arteries are obscured by respiratory motion artifact. There is no evidence for aortic dissection. There is atherosclerosis of the aorta. Mild coronary artery calcification. No significant pericardial effusion. Mediastinum/Nodes: Trachea and mainstem bronchi appear within normal limits. Small hypodense nodules within the thyroid gland. Mild air distention of the esophagus with questionable mid esophageal thickening. No significantly enlarged mediastinal lymph nodes. Lungs/Pleura: Moderate left-sided pleural effusion. Partial consolidation in the right  lower lobe could reflect pneumonia. Dense left lower lobe consolidation and partial consolidation in the lingula, may also reflect pneumonia. Mild right apical infiltrate or scar. No pneumothorax. Upper Abdomen: No acute abnormality. Musculoskeletal: Degenerative changes of the spine. Artifact creates spurious appearance of sternal fracture. Irregularity of left antero lateral ribs is suspected to be due to artifact rather than fracture. Review of the MIP images confirms the above findings. IMPRESSION: 1. No definite CT evidence for acute pulmonary embolus or aortic dissection. 2. Moderate large left pleural effusion. Dense left lower lobe consolidation with partial consolidation in the lingula and right lower lobe which may represent pneumonia. 3. Mild mid esophageal thickening. Electronically Signed   By: Donavan Foil M.D.   On: 04/24/2016 22:01   Dg Chest Port 1 View  Result Date:  04/26/2016 CLINICAL DATA:  Sepsis, pneumonia, and acute respiratory failure EXAM: PORTABLE CHEST 1 VIEW COMPARISON:  Portable chest x-ray of April 25, 2016 FINDINGS: The lungs are reasonably well inflated. Confluent alveolar opacity is present in the right mid upper lung and appears stable. There is persistent increased density in the left mid and lower lung with obscuration of the hemidiaphragm. The retrocardiac region remains dense. The heart is not enlarged. The pulmonary vascularity is not engorged. The endotracheal tube tip lies 2.8 cm above the carina. The esophagogastric tube tip in proximal port project below the GE junction. The power port catheter tip and the left internal jugular venous catheter tip project over the midportion of the superior vena cava. IMPRESSION: Persistent bilateral pneumonia. Small left pleural effusion, stable. No late pneumothorax. The support tubes are in reasonable position. Electronically Signed   By: David  Martinique M.D.   On: 04/26/2016 07:10   Dg Chest Port 1 View  Result Date: 04/25/2016 CLINICAL DATA:  Central line placement, status post left thoracentesis EXAM: PORTABLE CHEST 1 VIEW COMPARISON:  04/25/2016 FINDINGS: Cardiac shadow is stable. An endotracheal tube, left jugular central line and right-sided chest wall port are seen in satisfactory position. No pneumothorax is noted. Decreased density is noted on the left consistent with the recent thoracentesis. Persistent lower lobe consolidation is noted. Again no pneumothorax is seen. Patchy infiltrate is seen in the right lung stable from the prior study. Nasogastric catheter is seen within the stomach. IMPRESSION: No pneumothorax following central line placement. Reduction in left-sided pleural effusion with persistent bilateral infiltrates. Electronically Signed   By: Inez Catalina M.D.   On: 04/25/2016 18:59   Portable Chest Xray  Addendum Date: 04/25/2016   ADDENDUM REPORT: 04/25/2016 13:41 ADDENDUM: ET tube  repositioning discussed by telephone with Nurse Reuben Likes on 04/25/2016 at 13:39 . Electronically Signed   By: Genevie Sereena M.D.   On: 04/25/2016 13:41   Result Date: 04/25/2016 CLINICAL DATA:  75 year old female intubated. Enteric tube placement. Initial encounter. EXAM: PORTABLE CHEST 1 VIEW COMPARISON:  Portable abdomen from today reported separately. Chest CTA 04/24/2016. FINDINGS: Portable AP semi upright view at at 1251 hours. Endotracheal tube tip is just inside the right mainstem bronchus. Enteric tube courses to the abdomen, see comparison. Stable right chest porta cath, accessed. Widespread left mid and lower lung dense opacification seem to represent a combination of dense consolidation and pleural effusion on the CTA yesterday. Similar patchy peribronchial consolidation in the right lower lobe was present, although there is new right upper lobe peribronchial consolidation. No right pleural effusion identified. Stable cardiac size and mediastinal contours. IMPRESSION: 1. Endotracheal tube tip just inside the right mainstem bronchus. Retract 2-3  cm for more optimal placement. 2. Severe left lung pneumonia with pleural effusion as depicted by CTA yesterday. Progressive right lung involvement since that time. No right pleural effusion identified. 3. Enteric tube courses to the abdomen, see portable abdomen comparison from today reported separately. Electronically Signed: By: Genevie Gimena M.D. On: 04/25/2016 13:33   Dg Abd Portable 1v  Result Date: 04/25/2016 CLINICAL DATA:  75 year old female OG tube placement. Initial encounter. EXAM: PORTABLE ABDOMEN - 1 VIEW COMPARISON:  Chest CTA 04/24/2016. FINDINGS: Portable AP supine view at 1306 hours. Enteric tube placed and courses into the left upper quadrant ultimately terminating just across midline at the lower lumbar level. The side hole is in the left abdomen. This is likely within a ptotic stomach. Non obstructed bowel gas pattern. Vascular calcifications in the  abdomen and pelvis. Dense retrocardiac opacity persists. No definite pneumoperitoneum on this supine view. IMPRESSION: Enteric tube in place with side hole at the level of the gastric body. Suspected gastroptosis. Electronically Signed   By: Genevie Pablo M.D.   On: 04/25/2016 13:30    Labs:  CBC:  Recent Labs  04/23/16 1622 04/24/16 0523 04/25/16 0511 04/26/16 0349  WBC 3.0* 1.7* 0.5* 0.5*  HGB 8.9* 8.7* 7.5* 7.2*  HCT 25.5* 25.0* 21.8* 20.6*  PLT 21* 16* 15* 78*    COAGS:  Recent Labs  01/11/16 1225  INR 1.20    BMP:  Recent Labs  04/23/16 0343 04/24/16 0523 04/25/16 0511 04/26/16 0349  NA 136 137 135 138  K 3.1* 3.3* 3.2* 3.0*  CL 100* 106 108 108  CO2 26 25 21* 23  GLUCOSE 157* 142* 145* 139*  BUN 24* 31* 21* 19  CALCIUM 8.0* 7.5* 7.0* 6.8*  CREATININE 1.03* 0.85 0.74 0.78  GFRNONAA 51* >60 >60 >60  GFRAA 59* >60 >60 >60    LIVER FUNCTION TESTS:  Recent Labs  04/16/16 1115 04/23/16 0343 04/24/16 0523 04/25/16 1834 04/26/16 0349  BILITOT 1.00 1.4* 1.8*  --  1.3*  AST 34 19 23  --  17  ALT 25 17 18   --  13*  ALKPHOS 88* 74 65  --  69  PROT 6.5 6.7 6.0* 5.3* 5.1*  ALBUMIN 3.7 3.7 3.1*  --  2.4*    TUMOR MARKERS: No results for input(s): AFPTM, CEA, CA199, CHROMGRNA in the last 8760 hours.  Assessment and Plan: Kristina Meyer is a 75 y.o. female with a history of AML (receiving decitabine) and HTN who was referred to IR service for thoracostomy tube due to loculated left pleural, probably parapneumonic,effusion.She had a recent thoracentesis by CCM, yielding only 500cc with residual fluid remaining.  She is currently sedated and on ventilator. Febrile (103.5) with WBC of 0.5, Hgb 7.2, and platelets 78. Risks and benefits discussed with the patient's family including bleeding, infection, damage to adjacent structures,  and sepsis.All of their questions were answered, patient's family  agreeable to proceed.Consent signed and in chart. Procedure scheduled for  this afternoon.       Thank you for this interesting consult.  I greatly enjoyed meeting Kristina Meyer and look forward to participating in their care.  A copy of this report was sent to the requesting provider on this date.  Electronically Signed: D. Rowe Robert 04/26/2016, 3:56 PM   I spent a total of 25 minutes in face to face in clinical consultation, greater than 50% of which was counseling/coordinating care for thoracostomy tube placement.

## 2016-04-26 NOTE — Procedures (Signed)
L chest tube 12 Fr Serosanguinous fluid. No comp/EBL

## 2016-04-26 NOTE — Progress Notes (Signed)
Ms. Opie is now intubated. This happened yesterday afternoon. She also is on pressor support.  Her chest x-ray this morning showed bilateral pneumonia. It does not look like this was any worseShe is on multiple antibiotics. She is on antifungal. I appreciate pharmacy's help with this. Her cultures so far are negative. I do not know if any bronchial secretions were obtained when she was intubated.  We have her on Neupogen to try help with her white cell count. Her white cell count is still 0.5. Her hemoglobin is dropping. It is 7.2. I suspect though she probably will need to be transfused.  Her platelet count is 78,000. I suppose she was given platelets yesterday. It looks like she may have been  given 3 units.  She is sedated now.  Her potassium is 3.0. Her renal function is still okay. Her creatinine is 0.78. Albumin is 2.4. Her pro-calcitonin has gone up to 5.22.  She still is in the throes of her nadir. I will increase the Neupogen to try to get her white cell count up a little bit more.  I spoke to her husband yesterday by phone. I told him the serious situation that she was in. I told him that there was a possibility that she may not pull through this. This is going to be all about getting her white cell count back to try to help her immune system fight this pneumonia.  She is on broad coverage. I cannot think of any additional antibiotic to give. I don't think she is an antiviral. Her influenza titers are negative.  Her temperature is 102 this morning. Her blood pressure is 102/42.  I know that she is getting outstanding care by everybody down the ICU. I know that they are working their best.  She has a central line and now.  I will go ahead and order 2 units of blood. This may help her a little bit. It may help support her blood pressure a little bit.  Lattie Haw, MD Proverbs 3:7-8

## 2016-04-26 NOTE — Progress Notes (Signed)
eLink Physician-Brief Progress Note Patient Name: Kristina Meyer DOB: 01/16/1941 MRN: [id number]   Date of Service  04/26/2016  HPI/Events of Note  Hypokalemia, hypophos, moderately low mag  eICU Interventions  Potassium, phos, and mag replaced     Intervention Category Intermediate Interventions: Electrolyte abnormality - evaluation and management  DETERDING,ELIZABETH 04/26/2016, 4:57 AM

## 2016-04-26 NOTE — Progress Notes (Signed)
PULMONARY / CRITICAL CARE MEDICINE   Name: Kristina Meyer MRN: [id number] DOB: 1940-09-30    ADMISSION DATE:  04/23/2016 CONSULTATION DATE:  04/24/16  REFERRING MD:  Dr Carolin Sicks, TRH  REASON FOR CONSULT: HCAP, neutropenic sepsis SUBJECTIVE:  Sedated on vent   VITAL SIGNS: BP (!) 102/42   Pulse 94   Temp (!) 102 F (38.9 C)   Resp (!) 23   Ht 5\' 2"  (1.575 m)   Wt 147 lb 4.8 oz (66.8 kg)   SpO2 100%   BMI 26.94 kg/m   HEMODYNAMICS: CVP:  [3 mmHg-12 mmHg] 8 mmHg  VENTILATOR SETTINGS: Vent Mode: PRVC FiO2 (%):  [40 %-60 %] 40 % Set Rate:  [24 bmp-30 bmp] 24 bmp Vt Set:  [300 mL-400 mL] 300 mL PEEP:  [5 cmH20-8 cmH20] 8 cmH20 Plateau Pressure:  [11 cmH20-23 cmH20] 19 cmH20  INTAKE / OUTPUT: I/O last 3 completed shifts: In: 5713.3 [I.V.:3919.2; Blood:549.2; NG/GT:30; IV Piggyback:1215] Out: 2890 [Urine:2340; Emesis/NG output:50; Other:500]  PHYSICAL EXAMINATION: General:  Ill appearing elderly woman, sedated on vent  Neuro:  Sedated on vent, non-focal generalized weakness HEENT:  OP clear, PERRL, orally intubated   Cardiovascular:  Tachycardic, regular Lungs:   decreased on left + equal chest rise  Abdomen:  Benign no OM + bowel sounds Musculoskeletal:  No edema Skin:  No rash or mottling   LABS:  BMET  Recent Labs Lab 04/24/16 0523 04/25/16 0511 04/26/16 0349  NA 137 135 138  K 3.3* 3.2* 3.0*  CL 106 108 108  CO2 25 21* 23  BUN 31* 21* 19  CREATININE 0.85 0.74 0.78  GLUCOSE 142* 145* 139*    Electrolytes  Recent Labs Lab 04/23/16 0343 04/24/16 0523 04/25/16 0511 04/26/16 0349  CALCIUM 8.0* 7.5* 7.0* 6.8*  MG 2.0  --   --  1.7  PHOS  --   --   --  1.4*    CBC  Recent Labs Lab 04/24/16 0523 04/25/16 0511 04/26/16 0349  WBC 1.7* 0.5* 0.5*  HGB 8.7* 7.5* 7.2*  HCT 25.0* 21.8* 20.6*  PLT 16* 15* 78*    Coag's No results for input(s): APTT, INR in the last 168 hours.  Sepsis Markers  Recent Labs Lab 04/23/16 0358 04/24/16 1515  04/24/16 1519 04/25/16 0511 04/26/16 0349  LATICACIDVEN 0.82  --  1.1 0.9  --   PROCALCITON  --  3.70  --  3.65 5.22    ABG  Recent Labs Lab 04/25/16 1511 04/25/16 1710  PHART 7.419 7.458*  PCO2ART 34.0 31.3*  PO2ART 76.5* 84.3    Liver Enzymes  Recent Labs Lab 04/23/16 0343 04/24/16 0523 04/26/16 0349  AST 19 23 17   ALT 17 18 13*  ALKPHOS 74 65 69  BILITOT 1.4* 1.8* 1.3*  ALBUMIN 3.7 3.1* 2.4*    Cardiac Enzymes No results for input(s): TROPONINI, PROBNP in the last 168 hours.  Glucose  Recent Labs Lab 04/25/16 2044 04/26/16 0003 04/26/16 0319 04/26/16 0805  GLUCAP 128* 104* 115* 168*    Imaging Dg Chest Port 1 View  Result Date: 04/26/2016 CLINICAL DATA:  Sepsis, pneumonia, and acute respiratory failure EXAM: PORTABLE CHEST 1 VIEW COMPARISON:  Portable chest x-ray of April 25, 2016 FINDINGS: The lungs are reasonably well inflated. Confluent alveolar opacity is present in the right mid upper lung and appears stable. There is persistent increased density in the left mid and lower lung with obscuration of the hemidiaphragm. The retrocardiac region remains dense. The heart is not enlarged.  The pulmonary vascularity is not engorged. The endotracheal tube tip lies 2.8 cm above the carina. The esophagogastric tube tip in proximal port project below the GE junction. The power port catheter tip and the left internal jugular venous catheter tip project over the midportion of the superior vena cava. IMPRESSION: Persistent bilateral pneumonia. Small left pleural effusion, stable. No late pneumothorax. The support tubes are in reasonable position. Electronically Signed   By: David  Martinique M.D.   On: 04/26/2016 07:10   Dg Chest Port 1 View  Result Date: 04/25/2016 CLINICAL DATA:  Central line placement, status post left thoracentesis EXAM: PORTABLE CHEST 1 VIEW COMPARISON:  04/25/2016 FINDINGS: Cardiac shadow is stable. An endotracheal tube, left jugular central line  and right-sided chest wall port are seen in satisfactory position. No pneumothorax is noted. Decreased density is noted on the left consistent with the recent thoracentesis. Persistent lower lobe consolidation is noted. Again no pneumothorax is seen. Patchy infiltrate is seen in the right lung stable from the prior study. Nasogastric catheter is seen within the stomach. IMPRESSION: No pneumothorax following central line placement. Reduction in left-sided pleural effusion with persistent bilateral infiltrates. Electronically Signed   By: Inez Catalina M.D.   On: 04/25/2016 18:59   Portable Chest Xray  Addendum Date: 04/25/2016   ADDENDUM REPORT: 04/25/2016 13:41 ADDENDUM: ET tube repositioning discussed by telephone with Nurse Reuben Likes on 04/25/2016 at 13:39 . Electronically Signed   By: Genevie Cayle M.D.   On: 04/25/2016 13:41   Result Date: 04/25/2016 CLINICAL DATA:  75 year old female intubated. Enteric tube placement. Initial encounter. EXAM: PORTABLE CHEST 1 VIEW COMPARISON:  Portable abdomen from today reported separately. Chest CTA 04/24/2016. FINDINGS: Portable AP semi upright view at at 1251 hours. Endotracheal tube tip is just inside the right mainstem bronchus. Enteric tube courses to the abdomen, see comparison. Stable right chest porta cath, accessed. Widespread left mid and lower lung dense opacification seem to represent a combination of dense consolidation and pleural effusion on the CTA yesterday. Similar patchy peribronchial consolidation in the right lower lobe was present, although there is new right upper lobe peribronchial consolidation. No right pleural effusion identified. Stable cardiac size and mediastinal contours. IMPRESSION: 1. Endotracheal tube tip just inside the right mainstem bronchus. Retract 2-3 cm for more optimal placement. 2. Severe left lung pneumonia with pleural effusion as depicted by CTA yesterday. Progressive right lung involvement since that time. No right pleural effusion  identified. 3. Enteric tube courses to the abdomen, see portable abdomen comparison from today reported separately. Electronically Signed: By: Genevie Carrin M.D. On: 04/25/2016 13:33   Dg Abd Portable 1v  Result Date: 04/25/2016 CLINICAL DATA:  75 year old female OG tube placement. Initial encounter. EXAM: PORTABLE ABDOMEN - 1 VIEW COMPARISON:  Chest CTA 04/24/2016. FINDINGS: Portable AP supine view at 1306 hours. Enteric tube placed and courses into the left upper quadrant ultimately terminating just across midline at the lower lumbar level. The side hole is in the left abdomen. This is likely within a ptotic stomach. Non obstructed bowel gas pattern. Vascular calcifications in the abdomen and pelvis. Dense retrocardiac opacity persists. No definite pneumoperitoneum on this supine view. IMPRESSION: Enteric tube in place with side hole at the level of the gastric body. Suspected gastroptosis. Electronically Signed   By: Genevie Oniyah M.D.   On: 04/25/2016 13:30   Persistent left sided effusion    STUDIES:  CT chest 11/28 >> 1. No definite CT evidence for acute pulmonary embolus or aortic dissection.  2. Moderate large left pleural effusion. Dense left lower lobe consolidation with partial consolidation in the lingula and right lower lobe which may represent pneumonia. 3. Mild mid esophageal thickening  CULTURES: Blood 11/27 >>  Urine 11/27 >> insignificant growth Blood 11/28 >>  Urine 11/28 >>  Viral resp panel 11/28 >>  Pleural fluid 11/29>>>  ANTIBIOTICS: Cefepime 11/27 > 11/28 azithro 11/27 >>  vanco 11/28 >>  meropenem 11/28 >>  anidulafungin 11/30>>>   SIGNIFICANT EVENTS:  LINES/TUBES: Port-s-cath >>   ASSESSMENT / PLAN:  PULMONARY A: Acute hypoxic respiratory failure Complicated left effusion (exudate) LLL HCAP based on presentation and CXR Chest pain P:   Cont full vent support  IR consult for CT guided CT See ID section  Cont supportive care  F/u pleural  cytology  CARDIOVASCULAR A:  SIRS/sepsis (neutropenic) Transient hypotension, improved w IVF P:  Telemetry Hold home HCTZ; careful w norflex, xanax cvp goal 8-12 abx as below   RENAL A:   Mild acute renal insufficiency, improved w volume Hypokalemia  P:   Replace K Follow BMP and UOP Avoid hypotension   GASTROINTESTINAL A:   SUP Nutrition Nausea, improved P:   H2B for SUP Cont tubefeeds   HEMATOLOGIC / ONCOLOGIC A:   AML receiving treatment, 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 Can probably stop anidulafungin soon Follow cx data Needs CT guided CT   ENDOCRINE A:   No hx DM P:   Follow glucose on BMP  NEUROLOGIC A:   Anxiety / depression P:   RASS goal: -1 to -2 Continue home Paxil     FAMILY  - Updates: No family at bedside, plans discussed with the patient.   - Inter-disciplinary family meet or Palliative Care meeting due by:  12/5   Neutropenic sepsis in setting of dense LLL PNA. C/b worsening hypoxic respiratory failure, complicated left effusion & pancytopenia. She is on broad spec abx now intubated, and s/p thoracentesis on 11/29 which was exudative by lights criteria. She is still pressor dependent but I suspect this is diprivan related. Her post-thora Ultrasound did show residual posterior fluid collection c/w loculation so she will need CT guided drainage. I do not think she is a candidate for VATS. Actually looking a little better so in addition to CT plan is supportive.   Also- family asking about Vitamin C (IV high dosing). This was based on a 60 minutes show they recorded. We have very  limited experience w/ this in this hospital. The once time we did it resulted in life threatening bradycardia. I am not comfortable w/ this option given it has no randomized controlled studies to support it. Would only consider it if we were not having any improvement  My critical care time: 40 minutes  [name] ACNP-BC [place] Pager # [phone] OR # [phone] if no answer

## 2016-04-27 ENCOUNTER — Inpatient Hospital Stay (HOSPITAL_COMMUNITY): Payer: Medicare Other

## 2016-04-27 DIAGNOSIS — D72819 Decreased white blood cell count, unspecified: Secondary | ICD-10-CM

## 2016-04-27 DIAGNOSIS — D649 Anemia, unspecified: Secondary | ICD-10-CM

## 2016-04-27 DIAGNOSIS — I10 Essential (primary) hypertension: Secondary | ICD-10-CM

## 2016-04-27 DIAGNOSIS — J90 Pleural effusion, not elsewhere classified: Secondary | ICD-10-CM

## 2016-04-27 DIAGNOSIS — D709 Neutropenia, unspecified: Secondary | ICD-10-CM

## 2016-04-27 LAB — TYPE AND SCREEN
BLOOD PRODUCT EXPIRATION DATE: 201712052359
BLOOD PRODUCT EXPIRATION DATE: 201712062359
BLOOD PRODUCT EXPIRATION DATE: 201712092359
Blood Product Expiration Date: 201712052359
ISSUE DATE / TIME: 201711270900
ISSUE DATE / TIME: 201711301140
ISSUE DATE / TIME: 201711271204
UNIT TYPE AND RH: 6200
UNIT TYPE AND RH: 6200
Unit Type and Rh: 6200
Unit Type and Rh: 6200

## 2016-04-27 LAB — COMPREHENSIVE METABOLIC PANEL
ALBUMIN: 2 g/dL — AB (ref 3.5–5.0)
ALT: 12 U/L — AB (ref 14–54)
AST: 17 U/L (ref 15–41)
Alkaline Phosphatase: 70 U/L (ref 38–126)
Anion gap: 5 (ref 5–15)
BUN: 14 mg/dL (ref 6–20)
CHLORIDE: 105 mmol/L (ref 101–111)
CO2: 24 mmol/L (ref 22–32)
CREATININE: 0.57 mg/dL (ref 0.44–1.00)
Calcium: 6.5 mg/dL — ABNORMAL LOW (ref 8.9–10.3)
GFR calc Af Amer: >60 mL/min (ref >60)
GFR calc non Af Amer: >60 mL/min (ref >60)
GLUCOSE: 131 mg/dL — AB (ref 65–99)
POTASSIUM: 3.6 mmol/L (ref 3.5–5.1)
Sodium: 134 mmol/L — ABNORMAL LOW (ref 135–145)
Total Bilirubin: 1.5 mg/dL — ABNORMAL HIGH (ref 0.3–1.2)
Total Protein: 4.9 g/dL — ABNORMAL LOW (ref 6.5–8.1)

## 2016-04-27 LAB — BLOOD GAS, ARTERIAL
Acid-base deficit: 1.4 mmol/L (ref 0.0–2.0)
Bicarbonate: 21.8 mmol/L (ref 20.0–28.0)
DRAWN BY: 308601
FIO2: 40
MECHVT: 300 mL
O2 SAT: 96 %
PATIENT TEMPERATURE: 39.6
PCO2 ART: 36.7 mmHg (ref 32.0–48.0)
PEEP: 8 cmH2O
PH ART: 7.405 (ref 7.350–7.450)
PO2 ART: 90.6 mmHg (ref 83.0–108.0)
RATE: 24 resp/min

## 2016-04-27 LAB — GLUCOSE, CAPILLARY: Glucose-Capillary: 133 mg/dL — ABNORMAL HIGH (ref 65–99)

## 2016-04-27 LAB — CBC
HEMATOCRIT: 23.4 % — AB (ref 36.0–46.0)
Hemoglobin: 8.2 g/dL — ABNORMAL LOW (ref 12.0–15.0)
MCH: 32 pg (ref 26.0–34.0)
MCHC: 35 g/dL (ref 30.0–36.0)
MCV: 91.4 fL (ref 78.0–100.0)
PLATELETS: 48 10*3/uL — AB (ref 150–400)
RBC: 2.56 MIL/uL — AB (ref 3.87–5.11)
RDW: 19.6 % — AB (ref 11.5–15.5)
WBC: 0.5 10*3/uL — AB (ref 4.0–10.5)

## 2016-04-27 LAB — PHOSPHORUS: PHOSPHORUS: 1.6 mg/dL — AB (ref 2.5–4.6)

## 2016-04-27 LAB — MAGNESIUM: Magnesium: 2 mg/dL (ref 1.7–2.4)

## 2016-04-27 LAB — CHOLESTEROL, BODY FLUID: Cholesterol, Fluid: 39 mg/dL

## 2016-04-27 MED ORDER — DEXTROSE 5 % IV SOLN
500.0000 mg | INTRAVENOUS | Status: DC
  Administered 2016-04-27: 500 mg via INTRAVENOUS
  Filled 2016-04-27: qty 500

## 2016-04-27 MED ORDER — VANCOMYCIN HCL 500 MG IV SOLR
500.0000 mg | Freq: Two times a day (BID) | INTRAVENOUS | Status: DC
  Administered 2016-04-27 (×2): 500 mg via INTRAVENOUS
  Filled 2016-04-27 (×3): qty 500

## 2016-04-27 MED ORDER — FENTANYL CITRATE (PF) 100 MCG/2ML IJ SOLN
50.0000 ug | INTRAMUSCULAR | Status: AC | PRN
  Administered 2016-04-27 – 2016-05-01 (×3): 50 ug via INTRAVENOUS
  Filled 2016-04-27 (×2): qty 2

## 2016-04-27 MED ORDER — DEXMEDETOMIDINE HCL IN NACL 200 MCG/50ML IV SOLN
0.0000 ug/kg/h | INTRAVENOUS | Status: AC
  Administered 2016-04-27 (×2): 0.6 ug/kg/h via INTRAVENOUS
  Administered 2016-04-27: 0.4 ug/kg/h via INTRAVENOUS
  Administered 2016-04-28: 0.3 ug/kg/h via INTRAVENOUS
  Filled 2016-04-27 (×5): qty 50

## 2016-04-27 MED ORDER — SODIUM PHOSPHATES 45 MMOLE/15ML IV SOLN
30.0000 mmol | Freq: Once | INTRAVENOUS | Status: AC
  Administered 2016-04-27: 30 mmol via INTRAVENOUS
  Filled 2016-04-27: qty 10

## 2016-04-27 MED ORDER — FENTANYL CITRATE (PF) 100 MCG/2ML IJ SOLN
50.0000 ug | INTRAMUSCULAR | Status: DC | PRN
  Administered 2016-04-27 – 2016-05-01 (×11): 50 ug via INTRAVENOUS
  Filled 2016-04-27 (×13): qty 2

## 2016-04-27 NOTE — Progress Notes (Signed)
PULMONARY / CRITICAL CARE MEDICINE   Name: Kristina Meyer MRN: [id number] DOB: 03-15-1941    ADMISSION DATE:  04/23/2016 CONSULTATION DATE:  04/24/16  REFERRING MD:  Dr Carolin Sicks, Anton Ruiz: HCAP, neutropenic sepsis SUBJECTIVE:  Sedated on vent   VITAL SIGNS: BP (!) 141/54 (BP Location: Left Arm)   Pulse (!) 130   Temp (!) 103.8 F (39.9 C) (Core (Comment))   Resp (!) 39   Ht 5\' 2"  (1.575 m)   Wt 147 lb 4.8 oz (66.8 kg)   SpO2 99%   BMI 26.94 kg/m   HEMODYNAMICS:    VENTILATOR SETTINGS: Vent Mode: PRVC FiO2 (%):  [40 %] 40 % Set Rate:  [24 bmp] 24 bmp Vt Set:  [300 mL] 300 mL PEEP:  [5 cmH20-8 cmH20] 5 cmH20 Plateau Pressure:  [11 cmH20-19 cmH20] 17 cmH20  INTAKE / OUTPUT: I/O last 3 completed shifts: In: 6268.9 [I.V.:4392.7; Blood:356.3; NG/GT:30; IV Piggyback:1490] Out: [id number] [Urine:1520; Emesis/NG output:50; Chest Tube:150]  PHYSICAL EXAMINATION: General:  Ill appearing elderly woman, sedated on vent  Neuro:  Sedated on vent, non-focal generalized weakness HEENT:  OP clear, PERRL, orally intubated   Cardiovascular:  Tachycardic, regular Lungs:    + equal chest rise decreased bilateral. Left chest tube w/ serous sang drainage Abdomen:  Benign no OM + bowel sounds Musculoskeletal:  No edema Skin:  No rash or mottling   LABS:  BMET  Recent Labs Lab 04/25/16 0511 04/26/16 0349 04/27/16 0453  NA 135 138 134*  K 3.2* 3.0* 3.6  CL 108 108 105  CO2 21* 23 24  BUN 21* 19 14  CREATININE 0.74 0.78 0.57  GLUCOSE 145* 139* 131*    Electrolytes  Recent Labs Lab 04/23/16 0343  04/25/16 0511 04/26/16 0349 04/27/16 0453  CALCIUM 8.0*  < > 7.0* 6.8* 6.5*  MG 2.0  --   --  1.7 2.0  PHOS  --   --   --  1.4* 1.6*  < > = values in this interval not displayed.  CBC  Recent Labs Lab 04/25/16 0511 04/26/16 0349 04/27/16 0453  WBC 0.5* 0.5* 0.5*  HGB 7.5* 7.2* 8.2*  HCT 21.8* 20.6* 23.4*  PLT 15* 78* 48*    Coag's No results for  input(s): APTT, INR in the last 168 hours.  Sepsis Markers  Recent Labs Lab 04/23/16 0358 04/24/16 1515 04/24/16 1519 04/25/16 0511 04/26/16 0349  LATICACIDVEN 0.82  --  1.1 0.9  --   PROCALCITON  --  3.70  --  3.65 5.22    ABG  Recent Labs Lab 04/25/16 1511 04/25/16 1710 04/27/16 0308  PHART 7.419 7.458* 7.405  PCO2ART 34.0 31.3* 36.7  PO2ART 76.5* 84.3 90.6    Liver Enzymes  Recent Labs Lab 04/24/16 0523 04/26/16 0349 04/27/16 0453  AST 23 17 17   ALT 18 13* 12*  ALKPHOS 65 69 70  BILITOT 1.8* 1.3* 1.5*  ALBUMIN 3.1* 2.4* 2.0*    Cardiac Enzymes No results for input(s): TROPONINI, PROBNP in the last 168 hours.  Glucose  Recent Labs Lab 04/25/16 2044 04/26/16 0003 04/26/16 0319 04/26/16 0805 04/26/16 1122  GLUCAP 128* 104* 115* 168* 123*    Imaging Ct Image Guided Fluid Drain By Catheter  Result Date: 04/27/2016 INDICATION: Left pleural effusion EXAM: CT IMAGE GUIDED FLUID DRAIN BY CATHETER MEDICATIONS: The patient is currently admitted to the hospital and receiving intravenous antibiotics. The antibiotics were administered within an appropriate time frame prior to the initiation of the procedure.  ANESTHESIA/SEDATION: None. COMPLICATIONS: None immediate. PROCEDURE: Informed written consent was obtained from the patient after a thorough discussion of the procedural risks, benefits and alternatives. All questions were addressed. Maximal Sterile Barrier Technique was utilized including caps, mask, sterile gowns, sterile gloves, sterile drape, hand hygiene and skin antiseptic. A timeout was performed prior to the initiation of the procedure. In the right decubitus position, the posterior left thorax was prepped and draped in a sterile fashion. Under CT guidance, an 18 gauge needle was advanced into the left pleural effusion and removed over an Amplatz wire. Twelve [place] dilator followed by a 12 [place] drain were inserted. The drain was looped, string fixed,  then sewn to the skin. Serosanguineous fluid was aspirated. FINDINGS: Imaging confirms placement of a left 77 French thoracostomy tube. IMPRESSION: Successful left 12 French thoracostomy. Electronically Signed   By: Marybelle Killings M.D.   On: 04/27/2016 07:42  much improved aeration on CXR   STUDIES:  CT chest 11/28 >> 1. No definite CT evidence for acute pulmonary embolus or aortic dissection. 2. Moderate large left pleural effusion. Dense left lower lobe consolidation with partial consolidation in the lingula and right lower lobe which may represent pneumonia. 3. Mild mid esophageal thickening  CULTURES: Blood 11/27 >>  Urine 11/27 >> insignificant growth Blood 11/28 >>  Urine 11/28 >>  Viral resp panel 11/28 >> neg Pleural fluid 11/29>>>no orgs  But abundant wbc  ANTIBIOTICS: Cefepime 11/27 > 11/28 azithro 11/27 >>  vanco 11/28 >>  meropenem 11/28 >>  anidulafungin 11/30>>>   SIGNIFICANT EVENTS: 11/29 thora. Incomplete drainage of left pleural space 11/30 Chest tube placed.  12/1 weaning.   LINES/TUBES: Port-s-cath >>  OETT 11/29>>> Left IJ CVL 11/29>>>   ASSESSMENT / PLAN:  PULMONARY A: Acute hypoxic respiratory failure Complicated left effusion (exudate)-->SP CT guided CT 11/30 LLL HCAP based on presentation and CXR Chest pain P:   Cont full vent support  Monitor CT output  Start PSV; transition to precedex See ID section  Cont supportive care  F/u pleural cytology  CARDIOVASCULAR A:  SIRS/sepsis (neutropenic) Transient hypotension, improved w IVF P:  Telemetry Hold home HCTZ; careful w norflex, xanax cvp goal 8-12 abx as below   RENAL A:   Mild acute renal insufficiency, improved w volume Hypokalemia  Hypophosphatemia  P:   Follow BMP and UOP Avoid hypotension  Receiving PO4  GASTROINTESTINAL A:   SUP Nutrition Nausea, improved P:   H2B for SUP Cont tubefeeds   HEMATOLOGIC / ONCOLOGIC A:   AML receiving treatment, 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 Will continue imipenem, vanc and azith. Will stop azith at day 5. Can probably stop anidulafungin soon (will d/w team ).  Follow cx data Cont CT drainage of left pleural space   ENDOCRINE A:   No hx DM P:   Follow glucose on BMP  NEUROLOGIC A:   Anxiety / depression P:   RASS goal: -1 to -2 Continue home Paxil     FAMILY  - Updates: No family at bedside, plans discussed with the patient.   - Inter-disciplinary family meet or Palliative Care meeting due by:  12/5   Neutropenic sepsis  in setting of dense LLL PNA. C/b worsening hypoxic respiratory failure, complicated left effusion & pancytopenia. She is on broad spec abx now intubated, and s/p thoracentesis on 11/29 which was exudative by lights criteria. She is still pressor dependent but I suspect this is diprivan related. Her post-thora Ultrasound did show residual posterior fluid collection c/w loculation so she now has a Chest tube. Her CXR is much improved. She is now weaning, will transition to precedex. Have asked Dr Titus Mould to eval current abx regimen and weigh in. I updated her husband at bedside.   My critical care time: 30 minutes  [name] ACNP-BC [place], [place] Pager # [phone] OR # [phone] if no answer   STAFF NOTE: I, Merrie Roof, MD FACP have personally reviewed patient's available data, including medical history, events of note, physical examination and test results as part of my evaluation. I have discussed with resident/NP and other care providers such as pharmacist, RN and RRT. In addition, I personally evaluated patient and elicited key  findings of: sedated , not fc, transitioning off prop to precedex, improved rt base on pcxr overall and remains culture neg, s/p pigtail chest tube placement, this is a unilateral process in setting of immunosuppression, highly unlikley atypical, dc azithro, in am if cultures remain neg would consider dc antifungals and vanc, likley just need to use empiric mdr/ antipseudomanl cobverage for 10 days course, if remains culture neg, ABg reviewed, keep same MV on vent, wean today PS 14 required, keep ct to suction,pcxr in am , I updated family in room, neuopgen maintain, start lasix to slight neg balance The patient is critically ill with multiple organ systems failure and requires high complexity decision making for assessment and support, frequent evaluation and titration of therapies, application of advanced monitoring technologies and extensive interpretation of multiple databases.   Critical Care Time devoted to patient care services described in this note is 35 Minutes. This time reflects time of care of this signee: Merrie Roof, MD FACP. This critical care time does not reflect procedure time, or teaching time or supervisory time of PA/NP/Med student/Med Resident etc but could involve care discussion time. Rest per NP/medical resident whose note is outlined above and that I agree with   Lavon Paganini. Titus Mould, MD, [place] Pgr: [facility] 04/27/2016 11:44 AM

## 2016-04-27 NOTE — Progress Notes (Signed)
[facility] Progress Note Patient Name: Kristina Meyer DOB: May 29, 1940 MRN: [id number]   Date of Service  04/27/2016  HPI/Events of Note  Hypophos  eICU Interventions  Phos replaced     Intervention Category Intermediate Interventions: Electrolyte abnormality - evaluation and management  DETERDING,ELIZABETH 04/27/2016, 5:36 AM

## 2016-04-27 NOTE — Progress Notes (Signed)
Patient ID: Kristina Meyer, female   DOB: 1940-10-04, 75 y.o.   MRN: [id number]    Referring Physician(s): Brand Males  Supervising Physician: Daryll Brod  Patient Status: [facility] - In-pt  Chief Complaint: Loculated left pleural effusion  Subjective: Patient on vent and sedated.    Allergies: Other  Medications: Prior to Admission medications   Medication Sig Start Date End Date Taking? Authorizing Provider  ALPRAZolam (XANAX) 0.5 MG tablet TAKE 1 TABLET BY MOUTH 3 TIMES A DAY AS NEEDED Patient taking differently: TAKE 1 TABLET BY MOUTH 3 TIMES A DAY AS NEEDED for anxiety. 03/26/16  Yes Volanda Napoleon, MD  aspirin 81 MG tablet Take 81 mg by mouth daily.    Yes Historical Provider, MD  atorvastatin (LIPITOR) 20 MG tablet Take 10 mg by mouth at bedtime.   Yes Historical Provider, MD  Calcium-Magnesium-Vitamin D (CALCIUM 1200+D3 PO) Take 1 tablet by mouth daily.   Yes Historical Provider, MD  cholecalciferol (VITAMIN D) 1000 UNITS tablet Take 1,000 Units by mouth daily.   Yes Historical Provider, MD  hydrochlorothiazide (HYDRODIURIL) 25 MG tablet Take by mouth. 12/08/16  Yes Historical Provider, MD  lidocaine-prilocaine (EMLA) cream Apply 1 application topically as needed. Place quarter sized amount of cream directly on portacath and cover with saran wrap at least 1 - 1 1/2 hours prior to procedure. 01/11/16  Yes Volanda Napoleon, MD  Multiple Vitamins-Minerals (MULTIVITAMIN ADULT) TABS Take 1 tablet by mouth daily.   Yes Historical Provider, MD  orphenadrine (NORFLEX) 100 MG tablet Take 1 tablet (100 mg total) by mouth at bedtime as needed for muscle spasms. 12/29/15  Yes Volanda Napoleon, MD  PARoxetine (PAXIL) 20 MG tablet Take 20 mg by mouth daily.   Yes Historical Provider, MD  potassium chloride SA (K-DUR,KLOR-CON) 20 MEQ tablet Take 1 pill twice a day for 7 days, then 1 pill a day. 02/13/16  Yes Volanda Napoleon, MD  prochlorperazine (COMPAZINE) 5 MG tablet Take 1 tablet (5 mg total) by  mouth every 6 (six) hours as needed for nausea or vomiting. 01/05/16  Yes Volanda Napoleon, MD  sulfamethoxazole-trimethoprim (BACTRIM DS) 800-160 MG per tablet Take 1 tablet by mouth daily.   Yes Historical Provider, MD    Vital Signs: BP (!) 131/40   Pulse (!) 122   Temp (!) 102.9 F (39.4 C)   Resp (!) 27   Ht 5\' 2"  (1.575 m)   Wt 147 lb 4.8 oz (66.8 kg)   SpO2 99%   BMI 26.94 kg/m   Physical Exam: Chest: left chest tube in place. Drain site is c/d/i.  Output has been serosang in nature.  Total of 200cc since placement.  No air leak.  Breath sounds are coarse  Imaging: Dg Chest 2 View  Result Date: 04/24/2016 CLINICAL DATA:  Followup pneumonia. Left-sided chest pain and fever. EXAM: CHEST  2 VIEW COMPARISON:  04/23/2016 FINDINGS: Power port appears unchanged. Right lung remains clear. There is progressive infiltrate throughout the left lower lobe with only mild volume loss. No acute bone finding. IMPRESSION: Left lower lobe pneumonia is progressive since yesterday's film. Electronically Signed   By: Nelson Chimes M.D.   On: 04/24/2016 09:08   Ct Angio Chest Pe W Or Wo Contrast  Result Date: 04/24/2016 CLINICAL DATA:  Chest pain EXAM: CT ANGIOGRAPHY CHEST WITH CONTRAST TECHNIQUE: Multidetector CT imaging of the chest was performed using the standard protocol during bolus administration of intravenous contrast. Multiplanar CT image reconstructions and MIPs  were obtained to evaluate the vascular anatomy. CONTRAST:  100 mL Isovue 370 intravenous COMPARISON:  Chest x-ray 04/24/2016 FINDINGS: Cardiovascular: No definite filling defects within the central or segmental pulmonary arteries to suggest an acute embolus. Distal right lower lobe pulmonary arteries are obscured by respiratory motion artifact. There is no evidence for aortic dissection. There is atherosclerosis of the aorta. Mild coronary artery calcification. No significant pericardial effusion. Mediastinum/Nodes: Trachea and mainstem  bronchi appear within normal limits. Small hypodense nodules within the thyroid gland. Mild air distention of the esophagus with questionable mid esophageal thickening. No significantly enlarged mediastinal lymph nodes. Lungs/Pleura: Moderate left-sided pleural effusion. Partial consolidation in the right lower lobe could reflect pneumonia. Dense left lower lobe consolidation and partial consolidation in the lingula, may also reflect pneumonia. Mild right apical infiltrate or scar. No pneumothorax. Upper Abdomen: No acute abnormality. Musculoskeletal: Degenerative changes of the spine. Artifact creates spurious appearance of sternal fracture. Irregularity of left antero lateral ribs is suspected to be due to artifact rather than fracture. Review of the MIP images confirms the above findings. IMPRESSION: 1. No definite CT evidence for acute pulmonary embolus or aortic dissection. 2. Moderate large left pleural effusion. Dense left lower lobe consolidation with partial consolidation in the lingula and right lower lobe which may represent pneumonia. 3. Mild mid esophageal thickening. Electronically Signed   By: Donavan Foil M.D.   On: 04/24/2016 22:01   Dg Chest Port 1 View  Result Date: 04/26/2016 CLINICAL DATA:  Sepsis, pneumonia, and acute respiratory failure EXAM: PORTABLE CHEST 1 VIEW COMPARISON:  Portable chest x-ray of April 25, 2016 FINDINGS: The lungs are reasonably well inflated. Confluent alveolar opacity is present in the right mid upper lung and appears stable. There is persistent increased density in the left mid and lower lung with obscuration of the hemidiaphragm. The retrocardiac region remains dense. The heart is not enlarged. The pulmonary vascularity is not engorged. The endotracheal tube tip lies 2.8 cm above the carina. The esophagogastric tube tip in proximal port project below the GE junction. The power port catheter tip and the left internal jugular venous catheter tip project over the  midportion of the superior vena cava. IMPRESSION: Persistent bilateral pneumonia. Small left pleural effusion, stable. No late pneumothorax. The support tubes are in reasonable position. Electronically Signed   By: David  Martinique M.D.   On: 04/26/2016 07:10   Dg Chest Port 1 View  Result Date: 04/25/2016 CLINICAL DATA:  Central line placement, status post left thoracentesis EXAM: PORTABLE CHEST 1 VIEW COMPARISON:  04/25/2016 FINDINGS: Cardiac shadow is stable. An endotracheal tube, left jugular central line and right-sided chest wall port are seen in satisfactory position. No pneumothorax is noted. Decreased density is noted on the left consistent with the recent thoracentesis. Persistent lower lobe consolidation is noted. Again no pneumothorax is seen. Patchy infiltrate is seen in the right lung stable from the prior study. Nasogastric catheter is seen within the stomach. IMPRESSION: No pneumothorax following central line placement. Reduction in left-sided pleural effusion with persistent bilateral infiltrates. Electronically Signed   By: Inez Catalina M.D.   On: 04/25/2016 18:59   Portable Chest Xray  Addendum Date: 04/25/2016   ADDENDUM REPORT: 04/25/2016 13:41 ADDENDUM: ET tube repositioning discussed by telephone with Nurse Reuben Likes on 04/25/2016 at 13:39 . Electronically Signed   By: Genevie Sarina M.D.   On: 04/25/2016 13:41   Result Date: 04/25/2016 CLINICAL DATA:  75 year old female intubated. Enteric tube placement. Initial encounter. EXAM: PORTABLE CHEST 1  VIEW COMPARISON:  Portable abdomen from today reported separately. Chest CTA 04/24/2016. FINDINGS: Portable AP semi upright view at at 1251 hours. Endotracheal tube tip is just inside the right mainstem bronchus. Enteric tube courses to the abdomen, see comparison. Stable right chest porta cath, accessed. Widespread left mid and lower lung dense opacification seem to represent a combination of dense consolidation and pleural effusion on the CTA  yesterday. Similar patchy peribronchial consolidation in the right lower lobe was present, although there is new right upper lobe peribronchial consolidation. No right pleural effusion identified. Stable cardiac size and mediastinal contours. IMPRESSION: 1. Endotracheal tube tip just inside the right mainstem bronchus. Retract 2-3 cm for more optimal placement. 2. Severe left lung pneumonia with pleural effusion as depicted by CTA yesterday. Progressive right lung involvement since that time. No right pleural effusion identified. 3. Enteric tube courses to the abdomen, see portable abdomen comparison from today reported separately. Electronically Signed: By: Genevie Aayla M.D. On: 04/25/2016 13:33   Dg Abd Portable 1v  Result Date: 04/25/2016 CLINICAL DATA:  75 year old female OG tube placement. Initial encounter. EXAM: PORTABLE ABDOMEN - 1 VIEW COMPARISON:  Chest CTA 04/24/2016. FINDINGS: Portable AP supine view at 1306 hours. Enteric tube placed and courses into the left upper quadrant ultimately terminating just across midline at the lower lumbar level. The side hole is in the left abdomen. This is likely within a ptotic stomach. Non obstructed bowel gas pattern. Vascular calcifications in the abdomen and pelvis. Dense retrocardiac opacity persists. No definite pneumoperitoneum on this supine view. IMPRESSION: Enteric tube in place with side hole at the level of the gastric body. Suspected gastroptosis. Electronically Signed   By: Genevie Tarisha M.D.   On: 04/25/2016 13:30   Ct Image Guided Fluid Drain By Catheter  Result Date: 04/27/2016 INDICATION: Left pleural effusion EXAM: CT IMAGE GUIDED FLUID DRAIN BY CATHETER MEDICATIONS: The patient is currently admitted to the hospital and receiving intravenous antibiotics. The antibiotics were administered within an appropriate time frame prior to the initiation of the procedure. ANESTHESIA/SEDATION: None. COMPLICATIONS: None immediate. PROCEDURE: Informed written consent  was obtained from the patient after a thorough discussion of the procedural risks, benefits and alternatives. All questions were addressed. Maximal Sterile Barrier Technique was utilized including caps, mask, sterile gowns, sterile gloves, sterile drape, hand hygiene and skin antiseptic. A timeout was performed prior to the initiation of the procedure. In the right decubitus position, the posterior left thorax was prepped and draped in a sterile fashion. Under CT guidance, an 18 gauge needle was advanced into the left pleural effusion and removed over an Amplatz wire. Twelve [place] dilator followed by a 12 [place] drain were inserted. The drain was looped, string fixed, then sewn to the skin. Serosanguineous fluid was aspirated. FINDINGS: Imaging confirms placement of a left 39 French thoracostomy tube. IMPRESSION: Successful left 12 French thoracostomy. Electronically Signed   By: Marybelle Killings M.D.   On: 04/27/2016 07:42    Labs:  CBC:  Recent Labs  04/24/16 0523 04/25/16 0511 04/26/16 0349 04/27/16 0453  WBC 1.7* 0.5* 0.5* 0.5*  HGB 8.7* 7.5* 7.2* 8.2*  HCT 25.0* 21.8* 20.6* 23.4*  PLT 16* 15* 78* 48*    COAGS:  Recent Labs  01/11/16 1225  INR 1.20    BMP:  Recent Labs  04/24/16 0523 04/25/16 0511 04/26/16 0349 04/27/16 0453  NA 137 135 138 134*  K 3.3* 3.2* 3.0* 3.6  CL 106 108 108 105  CO2 25 21* 23  24  GLUCOSE 142* 145* 139* 131*  BUN 31* 21* 19 14  CALCIUM 7.5* 7.0* 6.8* 6.5*  CREATININE 0.85 0.74 0.78 0.57  GFRNONAA >60 >60 >60 >60  GFRAA >60 >60 >60 >60    LIVER FUNCTION TESTS:  Recent Labs  04/23/16 0343 04/24/16 0523 04/25/16 1834 04/26/16 0349 04/27/16 0453  BILITOT 1.4* 1.8*  --  1.3* 1.5*  AST 19 23  --  17 17  ALT 17 18  --  13* 12*  ALKPHOS 74 65  --  69 70  PROT 6.7 6.0* 5.3* 5.1* 4.9*  ALBUMIN 3.7 3.1*  --  2.4* 2.0*    Assessment and Plan: 1. Loculated left pleural effusion, s/p drain placement on 11/30 - Hoss -patient intubated and  sedated.  Some serosang output since drain placement yesterday -cont drain placement for now and follow.  Still on suction.   Electronically Signed: Henreitta Cea 04/27/2016, 9:27 AM   I spent a total of 15 Minutes at the the patient's bedside AND on the patient's hospital floor or unit, greater than 50% of which was counseling/coordinating care for loculated left pleural effusion

## 2016-04-27 NOTE — Progress Notes (Signed)
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  Broad abx were started on admission for sepsis and PNA.     Today, 04/27/2016: - day #4 vancomycin and meropenem; day #5 azithromycin, day #3 Eraxis -  Remains febrile, PCT rising, WBC / ANC low (on neupogen) - scr stable (crcl~52)  Plan: - continue Vancomycin  500mg  IV q12h --> vancomycin dose was note given at 0600 this morning (verified with 3rd shift RN), recheduled dose to be given now.  If to continue with vancomycin, pharmacy will plan on checking level on 12/2. - continue meropenem 1gm IV q8h and azithromycin 500 mg IV q24h - continue Eraxis 100 mg IV daily - f/u cultures, renal funct, clinical status  ___________________________  Height: 5\' 2"  (157.5 cm) Weight: 147 lb 4.8 oz (66.8 kg) IBW/kg (Calculated) : 50.1  Temp (24hrs), Avg:103 F (39.4 C), Min:102.2 F (39 C), Max:103.8 F (39.9 C)   Recent Labs Lab 04/23/16 0343 04/23/16 0358 04/23/16 1622 04/24/16 0523 04/24/16 1519 04/25/16 0511 04/26/16 0349 04/27/16 0453  WBC 2.1*  --  3.0* 1.7*  --  0.5* 0.5* 0.5*  CREATININE 1.03*  --   --  0.85  --  0.74 0.78 0.57  LATICACIDVEN  --  0.82  --   --  1.1 0.9  --   --     Estimated Creatinine Clearance: 52.8 mL/min (by C-G formula based on SCr of 0.57 mg/dL).    Allergies  Allergen Reactions  . Other Other (See Comments)    GENERAL Anesthesia, vomiting    Antimicrobials this admission:  11/27  cefepime >> 11/28 11/28 Vancomycin >> 11/28 Meropenem >> 11/27 azithromycin >>  11/29 Eraxis (empiric)>>  Dose adjustments this admission:  n/a  Microbiology results:  11/27 BCx x2: NGTD 11/27 UCx: insignificant growth 11/27: Legionella antigen: neg 11/27: Strep antigen: neg 11/28 MRSA PCR (-) 11/28 bcx x2: NGTD 11/29 Pleural fluid: pending, no organisms seen 11/29 TA: pending, no organisms seen   Thank you for  allowing pharmacy to be a part of this patient's care.  Dia Sitter, PharmD, BCPS 04/27/2016 9:34 AM

## 2016-04-27 NOTE — Progress Notes (Signed)
Nutrition Follow-up  INTERVENTION:   If tube feeding desired for patient, please consult RD.  Tube Feeding Recommendations: Initiate Jevity 1.2 @ 25 mL/hr to increase by 10 mL every 6 hours to reach goal rate of Jevity 1.2 @ 65 mL/hr. At goal rate, Jevity 1.2 @ 65 mL/hr will provide 1872 kcal, 87 grams of protein, and 1258 mL free water.   RD will continue to monitor for plan  NUTRITION DIAGNOSIS:   Inadequate oral intake related to inability to eat as evidenced by NPO status.  Ongoing.  GOAL:   Patient will meet greater than or equal to 90% of their needs  Not meeting.  MONITOR:   Vent status, Weight trends, Labs, I & O's  ASSESSMENT:   75 y.o. 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 The patient was in her usual state of health, got her Cytovene infusions last last Tuesday, and then over the last 3 or 4 days has developed progressive weakness and "just feeling awful". At first she thought she was just getting anemic again because of how tired she was over the weekend, but then tonight at 2AM she woke with fever to 102F, vomited, had worse malaise and chills and came to the ER.   She has had no dysuria or other urinary irritative symptoms.  She has had a new nonproductive cough this week.  No nose bleeds, bleeding from gums.  RD spoke with RN, pt not expected to extubate and no plans for TF as of right now. If TF is desired, please consult RD. Tube feeding recommendations provided above.   Patient is currently intubated on ventilator support MV: 9.6 L/min Temp (24hrs), Avg:103 F (39.4 C), Min:102 F (38.9 C), Max:103.8 F (39.9 C)  Propofol: stopped today  Medications: IV Sodium Phosphate once Labs reviewed: Low Na, Phos Mg/K WNL  Diet Order:  Diet NPO time specified  Skin:  Reviewed, no issues  Last BM:  11/30  Height:   Ht Readings from Last 1 Encounters:  04/25/16 5\' 2"  (1.575 m)     Weight:   Wt Readings from Last 1 Encounters:  04/23/16 147 lb 4.8 oz (66.8 kg)    Ideal Body Weight:  50 kg  BMI:  Body mass index is 26.94 kg/m.  Estimated Nutritional Needs:   Kcal:  1760  Protein:  90-100g  Fluid:  1.8 L/day  EDUCATION NEEDS:   No education needs identified at this time  Clayton Bibles, MS, RD, LDN Pager: [phone] After Hours Pager: [phone]

## 2016-04-27 NOTE — Progress Notes (Signed)
Kristina Meyer is still intubated. She now has a chest tube in the left side. This was to try to expand the left lung.  Her blood pressures are doing much better. She is on pressor support. Hopefully this can be tapered down.  Her white cell count is still low. It is still 0.5. I must say that she is at her nadir. I don't think that her white cell count has remained this low for this long in the past. If so, she really obviously had no problems. She is on a good dose of Neupogen.  Her CBC shows a white cell count 0.5. Hemoglobin 8.2. Platelet count 48,000.  She remains on full antibiotic coverage. So far, her cultures have been negative. The pleural fluid has been negative with respect to infection.  She still spiking temperatures. She is on full antibiotic support. She is on antifungal coverage. I just don't see that we need to add a antiviral coverage.  I know the staff is doing a fantastic job with her in the ICU. They are very attentive.  While she is in the hospital, when her blood counts recover, I will probably get another bone marrow test done. I really need to see where she stands with her leukemia. She was supposed to have one done after this cycle of treatment anyway. I think this will really he was a good idea as to what the future holds.  I would have to believe that she is responding. Her white cell count has improved dramatically. Her platelet count has also improved. We really have not had to transfuse her that much.  There is nothing different on her physical exam.  We just need to wait for her white cell to recover. I have to believe that this will happen over the weekend.  Lattie Haw, MD  [place] 14:14

## 2016-04-28 ENCOUNTER — Inpatient Hospital Stay (HOSPITAL_COMMUNITY): Payer: Medicare Other

## 2016-04-28 LAB — CULTURE, BLOOD (ROUTINE X 2)
CULTURE: NO GROWTH
Culture: NO GROWTH

## 2016-04-28 LAB — MAGNESIUM
MAGNESIUM: 2 mg/dL (ref 1.7–2.4)
MAGNESIUM: 2.1 mg/dL (ref 1.7–2.4)
MAGNESIUM: 2 mg/dL (ref 1.7–2.4)

## 2016-04-28 LAB — COMPREHENSIVE METABOLIC PANEL
ALBUMIN: 2 g/dL — AB (ref 3.5–5.0)
ALT: 11 U/L — AB (ref 14–54)
AST: 19 U/L (ref 15–41)
Alkaline Phosphatase: 77 U/L (ref 38–126)
Anion gap: 6 (ref 5–15)
BILIRUBIN TOTAL: 1.4 mg/dL — AB (ref 0.3–1.2)
BUN: 16 mg/dL (ref 6–20)
CHLORIDE: 105 mmol/L (ref 101–111)
CO2: 23 mmol/L (ref 22–32)
Calcium: 6.9 mg/dL — ABNORMAL LOW (ref 8.9–10.3)
Creatinine, Ser: 0.54 mg/dL (ref 0.44–1.00)
GFR calc Af Amer: >60 mL/min (ref >60)
GFR calc non Af Amer: >60 mL/min (ref >60)
GLUCOSE: 130 mg/dL — AB (ref 65–99)
POTASSIUM: 3.4 mmol/L — AB (ref 3.5–5.1)
Sodium: 134 mmol/L — ABNORMAL LOW (ref 135–145)
TOTAL PROTEIN: 4.9 g/dL — AB (ref 6.5–8.1)

## 2016-04-28 LAB — GLUCOSE, CAPILLARY
Glucose-Capillary: 149 mg/dL — ABNORMAL HIGH (ref 65–99)
Glucose-Capillary: 152 mg/dL — ABNORMAL HIGH (ref 65–99)

## 2016-04-28 LAB — CULTURE, RESPIRATORY: CULTURE: NO GROWTH

## 2016-04-28 LAB — BODY FLUID CULTURE: Culture: NO GROWTH

## 2016-04-28 LAB — CBC WITH DIFFERENTIAL/PLATELET
Basophils Absolute: 0 10*3/uL (ref 0.0–0.1)
Basophils Relative: 0 %
EOS PCT: 0 %
Eosinophils Absolute: 0 10*3/uL (ref 0.0–0.7)
HEMATOCRIT: 23.4 % — AB (ref 36.0–46.0)
Hemoglobin: 8.1 g/dL — ABNORMAL LOW (ref 12.0–15.0)
LYMPHS ABS: 0.4 10*3/uL — AB (ref 0.7–4.0)
Lymphocytes Relative: 77 %
MCH: 31.5 pg (ref 26.0–34.0)
MCHC: 34.6 g/dL (ref 30.0–36.0)
MCV: 91.1 fL (ref 78.0–100.0)
MONOS PCT: 11 %
Monocytes Absolute: 0 10*3/uL — ABNORMAL LOW (ref 0.1–1.0)
NEUTROS ABS: 0 10*3/uL — AB (ref 1.7–7.7)
Neutrophils Relative %: 12 %
Platelets: 53 10*3/uL — ABNORMAL LOW (ref 150–400)
RBC: 2.57 MIL/uL — ABNORMAL LOW (ref 3.87–5.11)
RDW: 18.9 % — AB (ref 11.5–15.5)
WBC: 0.4 10*3/uL — CL (ref 4.0–10.5)

## 2016-04-28 LAB — CULTURE, RESPIRATORY W GRAM STAIN

## 2016-04-28 LAB — PHOSPHORUS
PHOSPHORUS: 2.1 mg/dL — AB (ref 2.5–4.6)
PHOSPHORUS: 2.5 mg/dL (ref 2.5–4.6)
Phosphorus: 1.6 mg/dL — ABNORMAL LOW (ref 2.5–4.6)

## 2016-04-28 LAB — TRIGLYCERIDES: Triglycerides: 82 mg/dL (ref <150)

## 2016-04-28 LAB — VANCOMYCIN, TROUGH: VANCOMYCIN TR: 7 ug/mL — AB (ref 15–20)

## 2016-04-28 MED ORDER — PRO-STAT SUGAR FREE PO LIQD
30.0000 mL | Freq: Two times a day (BID) | ORAL | Status: DC
  Administered 2016-04-28 – 2016-04-29 (×3): 30 mL
  Filled 2016-04-28 (×3): qty 30

## 2016-04-28 MED ORDER — VANCOMYCIN HCL 500 MG IV SOLR
500.0000 mg | Freq: Three times a day (TID) | INTRAVENOUS | Status: DC
  Administered 2016-04-28 – 2016-04-29 (×4): 500 mg via INTRAVENOUS
  Filled 2016-04-28 (×6): qty 500

## 2016-04-28 MED ORDER — JEVITY 1.2 CAL PO LIQD
1000.0000 mL | ORAL | Status: DC
  Administered 2016-04-28: 16:00:00
  Administered 2016-04-29 (×2): 1000 mL

## 2016-04-28 MED ORDER — DEXTROSE 5 % IV SOLN
10.0000 mg/kg | Freq: Three times a day (TID) | INTRAVENOUS | Status: DC
  Administered 2016-04-28 – 2016-05-07 (×28): 670 mg via INTRAVENOUS
  Filled 2016-04-28 (×28): qty 13.4

## 2016-04-28 MED ORDER — DEXTROSE 5 % IV SOLN
40.0000 meq | Freq: Once | INTRAVENOUS | Status: AC
  Administered 2016-04-28: 40 meq via INTRAVENOUS
  Filled 2016-04-28: qty 9.09

## 2016-04-28 MED ORDER — VITAL HIGH PROTEIN PO LIQD
1000.0000 mL | ORAL | Status: DC
  Filled 2016-04-28: qty 1000

## 2016-04-28 NOTE — Progress Notes (Addendum)
PULMONARY / CRITICAL CARE MEDICINE   Name: Kristina Meyer MRN: [id number] DOB: 01/25/1941    ADMISSION DATE:  04/23/2016 CONSULTATION DATE:  04/24/16  REFERRING MD:  Dr Carolin Sicks, [place]: HCAP, neutropenic sepsis  SUBJECTIVE:  Sedated on vent  Remains febrile. Cultures are (-). Not really waking up last 24 hrs (on lower precedex this am)   VITAL SIGNS: BP (!) 109/43   Pulse 82   Temp 100.2 F (37.9 C)   Resp 20   Ht 5\' 2"  (1.575 m)   Wt 66.8 kg (147 lb 4.8 oz)   SpO2 100%   BMI 26.94 kg/m   HEMODYNAMICS:    VENTILATOR SETTINGS: Vent Mode: PRVC FiO2 (%):  [35 %-40 %] 35 % Set Rate:  [20 bmp] 20 bmp Vt Set:  [300 mL] 300 mL PEEP:  [5 cmH20-8 cmH20] 8 cmH20 Pressure Support:  [14 cmH20] 14 cmH20 Plateau Pressure:  [15 cmH20-19 cmH20] 15 cmH20  INTAKE / OUTPUT: I/O last 3 completed shifts: In: 3502.4 [I.V.:1683.4; IV [id number] Out: 2212 [Urine:1225; Emesis/NG output:350; Drains:345; [place]; Chest Tube:290]  PHYSICAL EXAMINATION: General:  Ill appearing elderly woman, sedated on vent  Neuro:  Sedated on vent, non-focal generalized weakness HEENT:  OP clear, PERRL, orally intubated   Cardiovascular:  Tachycardic, regular Lungs:    + equal chest rise decreased bilateral. Left chest tube w/ serous sang drainage. Crackles bibasilar.  Abdomen:  Benign no OM + bowel sounds Musculoskeletal:  Gr 1 edema.  Skin:  No rash or mottling   LABS:  BMET  Recent Labs Lab 04/26/16 0349 04/27/16 0453 04/28/16 0416  NA 138 134* 134*  K 3.0* 3.6 3.4*  CL 108 105 105  CO2 23 24 23   BUN 19 14 16   CREATININE 0.78 0.57 0.54  GLUCOSE 139* 131* 130*    Electrolytes  Recent Labs Lab 04/26/16 0349 04/27/16 0453 04/28/16 0416  CALCIUM 6.8* 6.5* 6.9*  MG 1.7 2.0 2.0  PHOS 1.4* 1.6* 1.6*    CBC  Recent Labs Lab 04/26/16 0349 04/27/16 0453 04/28/16 0416  WBC 0.5* 0.5* 0.4*  HGB 7.2* 8.2* 8.1*  HCT 20.6* 23.4* 23.4*  PLT 78* 48* 53*     Coag's No results for input(s): APTT, INR in the last 168 hours.  Sepsis Markers  Recent Labs Lab 04/23/16 0358 04/24/16 1515 04/24/16 1519 04/25/16 0511 04/26/16 0349  LATICACIDVEN 0.82  --  1.1 0.9  --   PROCALCITON  --  3.70  --  3.65 5.22    ABG  Recent Labs Lab 04/25/16 1511 04/25/16 1710 04/27/16 0308  PHART 7.419 7.458* 7.405  PCO2ART 34.0 31.3* 36.7  PO2ART 76.5* 84.3 90.6    Liver Enzymes  Recent Labs Lab 04/26/16 0349 04/27/16 0453 04/28/16 0416  AST 17 17 19   ALT 13* 12* 11*  ALKPHOS 69 70 77  BILITOT 1.3* 1.5* 1.4*  ALBUMIN 2.4* 2.0* 2.0*    Cardiac Enzymes No results for input(s): TROPONINI, PROBNP in the last 168 hours.  Glucose  Recent Labs Lab 04/25/16 2044 04/26/16 0003 04/26/16 0319 04/26/16 0805 04/26/16 1122 04/27/16 2259  GLUCAP 128* 104* 115* 168* 123* 133*    Imaging Dg Chest Port 1 View  Result Date: 04/28/2016 CLINICAL DATA:  Leukemia. EXAM: PORTABLE CHEST 1 VIEW COMPARISON:  April 27, 2016 FINDINGS: Stable support apparatus. No pneumothorax. Increasing infiltrate in the right upper lung and left base, worsened in the interval. No other interval changes. IMPRESSION: Worsening right upper lobe and left lower  lobe infiltrates. Electronically Signed   By: Dorise Bullion III M.D   On: 04/28/2016 07:25  much improved aeration on CXR   STUDIES:  CT chest 11/28 >> 1. No definite CT evidence for acute pulmonary embolus or aortic dissection. 2. Moderate large left pleural effusion. Dense left lower lobe consolidation with partial consolidation in the lingula and right lower lobe which may represent pneumonia. 3. Mild mid esophageal thickening  CULTURES: Blood 11/27 >>  Urine 11/27 >> insignificant growth Blood 11/28 >>  Urine 11/28 >>  Viral resp panel 11/28 >> neg Pleural fluid 11/29>>>no orgs  But abundant wbc  ANTIBIOTICS: Cefepime 11/27 > 11/28 azithro 11/27 >> 12/2 vanco 11/28 >>  meropenem 11/28 >>   anidulafungin 11/30>>> Acyclovir 12/1 >>    SIGNIFICANT EVENTS: 11/29 thora. Incomplete drainage of left pleural space 11/30 Chest tube placed.  12/1 weaning.   LINES/TUBES: Port-s-cath >>  OETT 11/29>>> Left IJ CVL 11/29>>>   ASSESSMENT / PLAN:  PULMONARY A: Acute hypoxic respiratory failure 2/2 Bilateral PNA/HCAP  in the ICH +/- Pulm edema Complicated left effusion (exudate)-->SP CT guided CT 11/30. Pleural cytology (-) for CA Chest pain P:   Cont full vent support  Monitor CT output  PST when awake.  Not waking up last 24 hrs, precedex being tapered off.  See ID section  Cont supportive care   CARDIOVASCULAR A:  SIRS/sepsis (neutropenic) Transient hypotension, improved w IVF Concern for pulm edema P:  Telemetry Hold home HCTZ; careful w norflex, xanax cvp goal 8-12 abx as below  Likely will need diuresis once off pressors. Holding off on diuresis 2/2 acyclovir as well. Check echo.   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 Martha Clan.   INFECTIOUS A:   Suspected LLL HCAP Complicated right pleural effusion  Severe sepsis without shock P:   She's neutropenic but all cultures to date are negative. Will continue imipenem, vanc and eraxis for now.  Acyclovir started 12/2.  Cont CT drainage of left pleural space  May need Dx bronch if cultures remain (-) to help with deescalation of abx.  Neosynephrine being weaned off.   ENDOCRINE A:   No hx DM P:   Follow  glucose on BMP  NEUROLOGIC A:   Anxiety / depression P:   RASS goal: -1 to -2 Continue home Paxil  Pt not waking up last 24 hrs >> try to wean off precedex.  PRN versed and fentanyl. May need cranial ct scan in am if not awake by then.     FAMILY  - Updates: No family at bedside 12/2.   - Inter-disciplinary family meet or Palliative Care meeting due by:  12/5   Critical care time with this pt today : 35 minutes.    Monica Becton, MD 04/28/2016, 12:14 PM [facility] Pager [phone] After 3 pm or if no answer, call [phone]

## 2016-04-28 NOTE — Progress Notes (Signed)
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  Broad abx were started on admission for sepsis and PNA.      Today, 04/28/2016: - day #5 vancomycin and meropenem; day #4 Eraxis; adding acyclovir (for broad empiric coverage) per Dr. Marin Olp - 12/2 CXR: Worsening right upper lobe and left lower lobe infiltrates. - Tmax 103.5, PCT rising on 11/30, WBC / ANC low - scr stable (crcl~53) - vancomycin trough level now back subtherapeutic at 7 (goal 15-20)  Plan: - acyclovir 670 mg IV q8h (~10 mg/kg/dose) - Increase vancomycin dose to 500 mg IV q8h - continue meropenem 1gm IV q8h - continue Eraxis 100 mg IV daily - f/u cultures, renal funct, clinical status  ___________________________  Height: 5\' 2"  (157.5 cm) Weight: 147 lb 4.8 oz (66.8 kg) IBW/kg (Calculated) : 50.1  Temp (24hrs), Avg:102.7 F (39.3 C), Min:101.3 F (38.5 C), Max:103.8 F (39.9 C)   Recent Labs Lab 04/23/16 0358  04/24/16 0523 04/24/16 1519 04/25/16 0511 04/26/16 0349 04/27/16 0453 04/28/16 0416  WBC  --   < > 1.7*  --  0.5* 0.5* 0.5* 0.4*  CREATININE  --   --  0.85  --  0.74 0.78 0.57 0.54  LATICACIDVEN 0.82  --   --  1.1 0.9  --   --   --   < > = values in this interval not displayed.  Estimated Creatinine Clearance: 52.8 mL/min (by C-G formula based on SCr of 0.54 mg/dL).    Allergies  Allergen Reactions  . Other Other (See Comments)    GENERAL Anesthesia, vomiting    Antimicrobials this admission:  11/27  cefepime >> 11/28 11/28 Vancomycin >> 11/28 Meropenem >> 11/27 azithromycin >> 12/01 11/29 Eraxis (empiric)>> 12/02 acyclovir>>  Dose adjustments this admission:  12/2 VT at 0915 =  7 (500 mg q12h)  Microbiology results:  11/27 BCx x2: NGTD 11/27 UCx: insignificant growth 11/27: Legionella antigen: neg 11/27: Strep antigen: neg 11/28 MRSA PCR (-) 11/28 bcx x2: NGTD 11/29 Pleural fluid:  pending, no organisms seen 11/29 TA: pending, no organisms seen FINAL   Thank you for allowing pharmacy to be a part of this patient's care.  Dia Sitter, PharmD, BCPS 04/28/2016 8:58 AM

## 2016-04-28 NOTE — Progress Notes (Signed)
Mrs. Navarrette still has marked leukopenia and neutropenia. Hopefully, her white cell count started coming up. Her platelet count is come up a little bit. This typically is a sign of impending bone marrow function.  She still is having temperature spikes. This morning she was 103.5. Her blood pressure is doing better. The pressor support is coming down.  So far, cultures are negative.  She is on fairly broad-spectrum antibiotics. I will go ahead and add an antiviral. Will have pharmacy dose acyclovir.  Hopefully she'll start some tube feeds.  Her labs look okay. Her creatinine is 0.54.  Her hemoglobin is 8.1.  I don't think she needs to be transfused.  She is starting to get some monocytes on her peripheral smear. Again this should indicate her bone marrow is beginning to work. I think once her bone marrow function begins to improve, then the temperatures will go down and she will improve.  Her chest x-ray today shows worsening infiltrates. Her oxygen saturation looks good. I hope that she is not developing ARDS.  She's had no obvious bleeding.  She still has the chest tube in the left side.  On her physical exam, I really cannot find anything that is different.  I know that she is getting outstanding care from everybody down the ICU. I hope that her white cell count started to improve and she can help fight this pneumonia. Hopefully, she is not developing ARDS.  We will continue do pretty hard for her. My staff in the office is praying real hard.  Lattie Haw, MD  2 Corinthians 12:9-10

## 2016-04-29 ENCOUNTER — Inpatient Hospital Stay (HOSPITAL_COMMUNITY): Payer: Medicare Other

## 2016-04-29 DIAGNOSIS — R0600 Dyspnea, unspecified: Secondary | ICD-10-CM

## 2016-04-29 LAB — CBC WITH DIFFERENTIAL/PLATELET
BASOS PCT: 0 %
Basophils Absolute: 0 10*3/uL (ref 0.0–0.1)
EOS ABS: 0 10*3/uL (ref 0.0–0.7)
EOS PCT: 0 %
HCT: 24.9 % — ABNORMAL LOW (ref 36.0–46.0)
HEMOGLOBIN: 8.6 g/dL — AB (ref 12.0–15.0)
Lymphocytes Relative: 77 %
Lymphs Abs: 0.3 10*3/uL — ABNORMAL LOW (ref 0.7–4.0)
MCH: 31 pg (ref 26.0–34.0)
MCHC: 34.5 g/dL (ref 30.0–36.0)
MCV: 89.9 fL (ref 78.0–100.0)
MONO ABS: 0 10*3/uL — AB (ref 0.1–1.0)
Monocytes Relative: 2 %
NEUTROS ABS: 0.1 10*3/uL — AB (ref 1.7–7.7)
NEUTROS PCT: 21 %
PLATELETS: 79 10*3/uL — AB (ref 150–400)
RBC: 2.77 MIL/uL — ABNORMAL LOW (ref 3.87–5.11)
RDW: 18.7 % — ABNORMAL HIGH (ref 11.5–15.5)
WBC: 0.4 10*3/uL — CL (ref 4.0–10.5)

## 2016-04-29 LAB — BASIC METABOLIC PANEL
Anion gap: 7 (ref 5–15)
Anion gap: 7 (ref 5–15)
BUN: 24 mg/dL — AB (ref 6–20)
BUN: 22 mg/dL — AB (ref 6–20)
CALCIUM: 7.1 mg/dL — AB (ref 8.9–10.3)
CHLORIDE: 106 mmol/L (ref 101–111)
CHLORIDE: 105 mmol/L (ref 101–111)
CO2: 22 mmol/L (ref 22–32)
CO2: 24 mmol/L (ref 22–32)
CREATININE: 0.5 mg/dL (ref 0.44–1.00)
CREATININE: 0.48 mg/dL (ref 0.44–1.00)
Calcium: 7.3 mg/dL — ABNORMAL LOW (ref 8.9–10.3)
GFR calc Af Amer: >60 mL/min (ref >60)
GFR calc non Af Amer: >60 mL/min (ref >60)
GLUCOSE: 164 mg/dL — AB (ref 65–99)
Glucose, Bld: 150 mg/dL — ABNORMAL HIGH (ref 65–99)
POTASSIUM: 3.4 mmol/L — AB (ref 3.5–5.1)
Potassium: 3 mmol/L — ABNORMAL LOW (ref 3.5–5.1)
SODIUM: 136 mmol/L (ref 135–145)
Sodium: 135 mmol/L (ref 135–145)

## 2016-04-29 LAB — ECHOCARDIOGRAM COMPLETE
HEIGHTINCHES: 62 in
WEIGHTICAEL: 2553.81 [oz_av]

## 2016-04-29 LAB — CULTURE, BLOOD (ROUTINE X 2)
Culture: NO GROWTH
Culture: NO GROWTH

## 2016-04-29 LAB — PHOSPHORUS
PHOSPHORUS: 2.9 mg/dL (ref 2.5–4.6)
Phosphorus: 2.1 mg/dL — ABNORMAL LOW (ref 2.5–4.6)

## 2016-04-29 LAB — MAGNESIUM
MAGNESIUM: 2.1 mg/dL (ref 1.7–2.4)
MAGNESIUM: 1.9 mg/dL (ref 1.7–2.4)

## 2016-04-29 LAB — VANCOMYCIN, TROUGH: VANCOMYCIN TR: 10 ug/mL — AB (ref 15–20)

## 2016-04-29 MED ORDER — FUROSEMIDE 10 MG/ML IJ SOLN
20.0000 mg | Freq: Once | INTRAMUSCULAR | Status: AC
  Administered 2016-04-29: 20 mg via INTRAVENOUS
  Filled 2016-04-29: qty 2

## 2016-04-29 MED ORDER — INSULIN ASPART 100 UNIT/ML ~~LOC~~ SOLN
0.0000 [IU] | SUBCUTANEOUS | Status: DC
  Administered 2016-04-29 (×2): 2 [IU] via SUBCUTANEOUS
  Administered 2016-04-29 (×2): 3 [IU] via SUBCUTANEOUS
  Administered 2016-04-30 (×2): 2 [IU] via SUBCUTANEOUS
  Administered 2016-04-30: 3 [IU] via SUBCUTANEOUS
  Administered 2016-04-30 – 2016-05-01 (×3): 2 [IU] via SUBCUTANEOUS
  Administered 2016-05-01: 1 [IU] via SUBCUTANEOUS
  Administered 2016-05-01 – 2016-05-06 (×15): 2 [IU] via SUBCUTANEOUS
  Administered 2016-05-06: 3 [IU] via SUBCUTANEOUS
  Administered 2016-05-07 (×2): 2 [IU] via SUBCUTANEOUS
  Administered 2016-05-07: 3 [IU] via SUBCUTANEOUS
  Administered 2016-05-07 – 2016-05-08 (×3): 2 [IU] via SUBCUTANEOUS
  Administered 2016-05-08: 3 [IU] via SUBCUTANEOUS
  Administered 2016-05-09: 2 [IU] via SUBCUTANEOUS
  Administered 2016-05-09: 3 [IU] via SUBCUTANEOUS
  Administered 2016-05-09 – 2016-05-10 (×5): 2 [IU] via SUBCUTANEOUS

## 2016-04-29 MED ORDER — VANCOMYCIN HCL IN DEXTROSE 750-5 MG/150ML-% IV SOLN
750.0000 mg | Freq: Three times a day (TID) | INTRAVENOUS | Status: DC
  Administered 2016-04-29 – 2016-05-05 (×17): 750 mg via INTRAVENOUS
  Filled 2016-04-29 (×18): qty 150

## 2016-04-29 MED ORDER — POTASSIUM PHOSPHATES 15 MMOLE/5ML IV SOLN
40.0000 meq | Freq: Once | INTRAVENOUS | Status: AC
  Administered 2016-04-29: 40 meq via INTRAVENOUS
  Filled 2016-04-29: qty 9.09

## 2016-04-29 NOTE — Progress Notes (Signed)
Chaplain visit the result of a referral from staff.  Kristina Meyer is in the last stages of cancer. Her blood count is down and it has been decided to make her a DNR. This was an emotional decision on both family and patient. All are frightened for the possible outcome of this hospital visit.  Family are practicing Christians and attend a local [employer]. They request regular spiritual care visits if possible.  Page chaplain if Kristina Parrot or her family need or request spiritual care.  Kariem Wolfson, DMin Chaplain

## 2016-04-29 NOTE — Progress Notes (Signed)
  Echocardiogram 2D Echocardiogram has been performed.  Kristina Meyer 04/29/2016, 8:55 AM

## 2016-04-29 NOTE — Progress Notes (Signed)
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  Broad abx were started on admission for sepsis and PNA.      Today, 04/29/2016: - day #6 vancomycin and meropenem; day #5 Eraxis; day #2 acyclovir (for broad empiric coverage) - 12/2 CXR: Worsening right upper lobe and left lower lobe infiltrates. - Tmax 103.8, PCT rising on 11/30, WBC / ANC low - scr stable (crcl~53)  Plan: - continueacyclovir 670 mg IV q8h (~10 mg/kg/dose) - continue vancomycin dose to 500 mg IV q8h --> will check level this afternoon to assess current regimen - continue meropenem 1gm IV q8h - continue Eraxis 100 mg IV daily - f/u cultures, renal funct, clinical status  ___________________________  Height: 5\' 2"  (157.5 cm) Weight: 159 lb 9.8 oz (72.4 kg) IBW/kg (Calculated) : 50.1  Temp (24hrs), Avg:100.8 F (38.2 C), Min:99.3 F (37.4 C), Max:102.6 F (39.2 C)   Recent Labs Lab 04/23/16 0358  04/24/16 1519 04/25/16 0511 04/26/16 0349 04/27/16 0453 04/28/16 0416 04/28/16 0915 04/29/16 0447  WBC  --   < >  --  0.5* 0.5* 0.5* 0.4*  --  0.4*  CREATININE  --   < >  --  0.74 0.78 0.57 0.54  --  0.50  LATICACIDVEN 0.82  --  1.1 0.9  --   --   --   --   --   VANCOTROUGH  --   --   --   --   --   --   --  7*  --   < > = values in this interval not displayed.  Estimated Creatinine Clearance: 54.9 mL/min (by C-G formula based on SCr of 0.5 mg/dL).    Allergies  Allergen Reactions  . Other Other (See Comments)    GENERAL Anesthesia, vomiting    Antimicrobials this admission:  11/27  cefepime >> 11/28 11/28 Vancomycin >> 11/28 Meropenem >> 11/27 azithromycin >> 12/01 11/29 Eraxis (empiric)>> 12/2 acyclovir (empiric)>>  Dose adjustments this admission:  12/2 VT at 0915 =  7 (500 mg q12h) --> increased to 500 mg q8h  Microbiology results:  11/27 BCx x2: neg FINAL 11/27 UCx: insignificant growth 11/27:  Legionella antigen: neg 11/27: Strep antigen: neg 11/28 MRSA PCR (-) 11/28 bcx x2: NGTD 11/29 Pleural fluid: pending 11/29 TA: neg FINAL   Thank you for allowing pharmacy to be a part of this patient's care.  Dia Sitter, PharmD, BCPS 04/29/2016 11:29 AM

## 2016-04-29 NOTE — Progress Notes (Signed)
Brief pharmacy note: for full details see Anh Pham's note from earlier today  Assessment and Plan:  Vanc trough =10 (subtherapeutic)  All doses given on time  Scr stable  Increase vancomycin to 750mg  IV q8h  Check vanc trough before 5th dose  Dolly Rias RPh 04/29/2016, 7:12 PM Pager [phone]

## 2016-04-29 NOTE — Progress Notes (Signed)
Kristina Meyer is still intubated. She is off pressors. Her platelet count is coming up. It is now 79,000. Her white cell count is still 0.4. I have her on Neupogen. Hopefully, this will start to working get her white cell count where she can have a better immune system.  Her hemoglobin is 8.6. Again, have to believe that she is beginning to respond to the Neupogen and get her immune system back. I hope that she'll be able to handle this.  She still is on multiple antibiotics. There's been no chest x-ray today.  She is not that responsive. I think she is off sedation.  She now has to feeds going.  Her renal function is okay. Her potassium was 3.4.  Again, cultures were all negative.  Hopefully, she will be able to pull out of this. I'm just surprised that it is taking the white cell count so long to recover.  I probably will set her up with a bone marrow test early next week so we can assess her leukemia status. I think this will be incredibly important so that we can gauge how aggressive that we need to be.  She's still having some temperatures. I really think that once her white cell count begins to recover, her temperatures will go down.  There is nothing new on her physical exam. She is still making urine. I don't see any bruises. She's had no bleeding. I'm glad that she is getting tube feeds.  As always, the staff in the ICU are doing a fantastic job taking care of her.  Lattie Haw, MD  [place] 3:1

## 2016-04-29 NOTE — Progress Notes (Signed)
eLink Physician-Brief Progress Note Patient Name: Zamara Quiroga DOB: [birth year] MRN: [id number]   Date of Service  04/29/2016  HPI/Events of Note  Episode of emesis.  Large in amount.  No change in vital signs.    eICU Interventions  Hold tube feeds for 2 hours. Then restart     Intervention Category Minor Interventions: OtherMauri Brooklyn, Mamie Nick 04/29/2016, 4:29 PM

## 2016-04-29 NOTE — Progress Notes (Signed)
Gastric residual checked per verbal order of ELink MD.  ~120 mL of residual was pulled back. MD made aware.   Orders to start tube feed at 25 mL/hr provided and executed.   Will continue to monitor.

## 2016-04-29 NOTE — Progress Notes (Signed)
Nutrition Follow-up  INTERVENTION:   Continue to advance Jevity 1.2 @ 55 mL/hr by 10 mL every 6 hours to reach goal rate of Jevity 1.2 @ 65 mL/hr.At goal rate, Jevity 1.2 @ 65 mL/hr will provide 1872 kcal, 87 grams of protein, and 1258 mL free water.   Will d/c 30 ml Prostat BID.  RD will continue to monitor  NUTRITION DIAGNOSIS:   Inadequate oral intake related to inability to eat as evidenced by NPO status.  Ongoing.  GOAL:   Patient will meet greater than or equal to 90% of their needs  Progressing.  MONITOR:   Vent status, Labs, Weight trends, TF tolerance, I & O's  ASSESSMENT:   75 y.o. 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 The patient was in her usual state of health, got her Cytovene infusions last last Tuesday, and then over the last 3 or 4 days has developed progressive weakness and "just feeling awful". At first she thought she was just getting anemic again because of how tired she was over the weekend, but then tonight at 2AM she woke with fever to 102F, vomited, had worse malaise and chills and came to the ER.   She has had no dysuria or other urinary irritative symptoms.  She has had a new nonproductive cough this week.  No nose bleeds, bleeding from gums.  RD consulted to manage TF. Jevity 1.2 was initiated over the weekend. Jevity 1.2 now infusing at 55 ml/hr and will meet goal rate of 65 ml/hr today. Pt has been receiving 30 ml Prostat BID. Will d/c now that tube feeding is reaching goal rate. Pt tolerating. Pt's weight is now +25 lb since admission 11/27.  Patient is currently intubated on ventilator support MV: 10.6 L/min Temp (24hrs), Avg:100.9 F (38.3 C), Min:99.3 F (37.4 C), Max:102.7 F (39.3 C)  Propofol: none  Medications reviewed. Labs reviewed: Low K, Phos Mg WNL  Diet Order:  Diet NPO time specified  Skin:  Reviewed, no issues  Last BM:  12/2  Height:   Ht  Readings from Last 1 Encounters:  04/25/16 5\' 2"  (1.575 m)    Weight:   Wt Readings from Last 1 Encounters:  04/29/16 159 lb 9.8 oz (72.4 kg)    Ideal Body Weight:  50 kg  BMI:  Body mass index is 29.19 kg/m.  Estimated Nutritional Needs:   Kcal:  1708  Protein:  90-100g  Fluid:  1.7L/day  EDUCATION NEEDS:   No education needs identified at this time  Clayton Bibles, MS, RD, LDN Pager: [phone] After Hours Pager: [phone]

## 2016-04-29 NOTE — Progress Notes (Addendum)
Pt had a vomiting occurrence while staff was changing her bed and performing hygiene.   Pt's tube feed were on hold, the head of bed was put flat to pull the pt up in bed and to change her bed pad.  Upon being turned the pt had a vomiting episode. The head of the bed was immediately placed upright, and the pt's mouth was suctioned out.  O2 sats remained at 100% following the occurrence.  OG tube was place to low wall suction.  MD was notified of the occurrence.  Orders to hold the tube feeds for a couple of hours were provided, and to check residual after the hours had passed.  Once observed that pt would remain stable, staff finished cleaning her up and repositioning her in bed.  Tube feeds currently off, OG flushed with 30 cc of air and clamped.  Will check residual and will continue to monitor.

## 2016-04-29 NOTE — Progress Notes (Addendum)
PULMONARY / CRITICAL CARE MEDICINE   Name: Kristina Meyer MRN: [id number] DOB: February 27, 1941    ADMISSION DATE:  04/23/2016 CONSULTATION DATE:  04/24/16  REFERRING MD:  Dr Carolin Sicks, [place]: HCAP, neutropenic sepsis  SUBJECTIVE:  Off sedation since 12nn 12/2. Not waking up. Off neosynephrine drip Remains febrile but better. Cultures are (-).   VITAL SIGNS: BP (!) 147/63   Pulse (!) 123   Temp (!) 101.1 F (38.4 C)   Resp (!) 34   Ht 5\' 2"  (1.575 m)   Wt 72.4 kg (159 lb 9.8 oz)   SpO2 100%   BMI 29.19 kg/m   HEMODYNAMICS:    VENTILATOR SETTINGS: Vent Mode: PRVC FiO2 (%):  [35 %] 35 % Set Rate:  [20 bmp] 20 bmp Vt Set:  [300 mL] 300 mL PEEP:  [8 cmH20] 8 cmH20 Pressure Support:  [8 cmH20] 8 cmH20 Plateau Pressure:  [16 cmH20] 16 cmH20  INTAKE / OUTPUT: I/O last 3 completed shifts: In: 3148.1 [I.V.:720.7; NG/GT:498.1; IV Piggyback:1929.3] Out: 2704 [id number]; Emesis/NG output:775; Drains:465; Chest Tube:10]  PHYSICAL EXAMINATION: General:  Ill appearing elderly woman, sedated on vent  Neuro:  Sedated on vent, non-focal generalized weakness HEENT:  OP clear, PERRL, orally intubated   Cardiovascular:  Tachycardic, regular Lungs:    + equal chest rise decreased bilateral. Left chest tube w/ serous sang drainage. Crackles bibasilar.  Abdomen:  Benign no OM + bowel sounds Musculoskeletal:  Gr 2 edema.  Skin:  No rash or mottling   LABS:  BMET  Recent Labs Lab 04/27/16 0453 04/28/16 0416 04/29/16 0447  NA 134* 134* 135  K 3.6 3.4* 3.4*  CL 105 105 106  CO2 24 23 22   BUN 14 16 24*  CREATININE 0.57 0.54 0.50  GLUCOSE 131* 130* 164*    Electrolytes  Recent Labs Lab 04/27/16 0453 04/28/16 0416 04/28/16 1255 04/28/16 1702 04/29/16 0447  CALCIUM 6.5* 6.9*  --   --  7.3*  MG 2.0 2.0 2.1 2.0 2.1  PHOS 1.6* 1.6* 2.1* 2.5 2.1*    CBC  Recent Labs Lab 04/27/16 0453 04/28/16 0416 04/29/16 0447  WBC 0.5* 0.4* 0.4*  HGB 8.2* 8.1*  8.6*  HCT 23.4* 23.4* 24.9*  PLT 48* 53* 79*    Coag's No results for input(s): APTT, INR in the last 168 hours.  Sepsis Markers  Recent Labs Lab 04/23/16 0358 04/24/16 1515 04/24/16 1519 04/25/16 0511 04/26/16 0349  LATICACIDVEN 0.82  --  1.1 0.9  --   PROCALCITON  --  3.70  --  3.65 5.22    ABG  Recent Labs Lab 04/25/16 1511 04/25/16 1710 04/27/16 0308  PHART 7.419 7.458* 7.405  PCO2ART 34.0 31.3* 36.7  PO2ART 76.5* 84.3 90.6    Liver Enzymes  Recent Labs Lab 04/26/16 0349 04/27/16 0453 04/28/16 0416  AST 17 17 19   ALT 13* 12* 11*  ALKPHOS 69 70 77  BILITOT 1.3* 1.5* 1.4*  ALBUMIN 2.4* 2.0* 2.0*    Cardiac Enzymes No results for input(s): TROPONINI, PROBNP in the last 168 hours.  Glucose  Recent Labs Lab 04/26/16 0319 04/26/16 0805 04/26/16 1122 04/27/16 2259 04/28/16 1929 04/28/16 2341  GLUCAP 115* 168* 123* 133* 149* 152*    Imaging No results found.much improved aeration on CXR   STUDIES:  CT chest 11/28 >> No definite CT evidence for acute pulmonary embolus or aortic Dissection.  Moderate large left pleural effusion. Dense left lower lobe consolidation with partial consolidation in the lingula and  right lower lobe which may represent pneumonia.  Mild mid esophageal thickening\  Echo 12/3 > EF 35%, hypokinesis (diffuse)  CULTURES: Blood 11/27 >>  Urine 11/27 >> insignificant growth Blood 11/28 >>  Urine 11/28 >>  Viral resp panel 11/28 >> neg Pleural fluid 11/29>>>no orgs  But abundant wbc  ANTIBIOTICS: Cefepime 11/27 > 11/28 azithro 11/27 >> 12/2 vanco 11/28 >>  meropenem 11/28 >>  anidulafungin 11/30>>> Acyclovir 12/1 >>    SIGNIFICANT EVENTS: 11/29 thora. Incomplete drainage of left pleural space 11/30 Chest tube placed.  12/1 weaning.   LINES/TUBES: Port-s-cath >>  OETT 11/29>>> Left IJ CVL 11/29>>>   ASSESSMENT / PLAN:  PULMONARY A: Acute hypoxic respiratory failure 2/2 Bilateral PNA/HCAP  in the ICH +/-  Pulm edema/Diffuse Hypokinesis Complicated left effusion (exudate)-->SP CT guided CT 11/30. Pleural cytology (-) for CA Chest pain P:   Cont full vent support  Monitor CT output  PST when awake.  Not waking up last 24 hrs. Off sedation since 12/2.  May need cranial ct scan or MRI in 1-2 days if she does not wake up.  See ID section  Cont supportive care   CARDIOVASCULAR A:  SIRS/sepsis (neutropenic) Transient hypotension, improved w IVF CHE EF 35%, with acute pulm edema and diffuse hypokinesis P:  Telemetry Hold home HCTZ; careful w norflex, xanax cvp goal 8-12 abx as below  Gentle diuresis (getting acyclovir which can potentially worsen renal fxn) Plan to get her better from infection and potentially consult cards re: echo findings.   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 Will continue imipenem, vanc and eraxis, acyclovir for now.   Cont CT drainage of left pleural space  May need Dx bronch if cultures remain (-) to help with deescalation of abx.  May need cranial CT scan if not waking up in 1-2 days Neosynephrine weaned off 12/2   ENDOCRINE  A:   No hx DM P:   Follow glucose on BMP  NEUROLOGIC A:   Anxiety / depression P:   RASS goal: -1 to -2 Will hold off on paxil 12/3 Pt not waking up last 24 hrs, Precedex drip discontinued on 12/2. May need cranial ct scan or brain MRI if she does  not wake up in 1-2 days.  PRN versed and fentanyl.    FAMILY  - Updates: No family at bedside 12/2. Updated husband and son at bedside on 12/3.  I extensively discussed with then over all condition and prognosis.  They have decided to make pt DNR.   - Inter-disciplinary family meet or Palliative Care meeting due by:  12/5   Critical care time with this pt today : 35 minutes.    Monica Becton, MD 04/29/2016, 10:51 AM [facility] Pager [phone] After 3 pm or if no answer, call [phone]

## 2016-04-30 ENCOUNTER — Inpatient Hospital Stay (HOSPITAL_COMMUNITY): Payer: Medicare Other

## 2016-04-30 LAB — CBC WITH DIFFERENTIAL/PLATELET
BASOS PCT: 0 %
Basophils Absolute: 0 10*3/uL (ref 0.0–0.1)
EOS PCT: 0 %
Eosinophils Absolute: 0 10*3/uL (ref 0.0–0.7)
HEMATOCRIT: 23.7 % — AB (ref 36.0–46.0)
Hemoglobin: 8.1 g/dL — ABNORMAL LOW (ref 12.0–15.0)
Lymphocytes Relative: 80 %
Lymphs Abs: 0.4 10*3/uL — ABNORMAL LOW (ref 0.7–4.0)
MCH: 31.5 pg (ref 26.0–34.0)
MCHC: 34.2 g/dL (ref 30.0–36.0)
MCV: 92.2 fL (ref 78.0–100.0)
MONOS PCT: 11 %
Monocytes Absolute: 0 10*3/uL — ABNORMAL LOW (ref 0.1–1.0)
NEUTROS PCT: 9 %
Neutro Abs: 0 10*3/uL — ABNORMAL LOW (ref 1.7–7.7)
Platelets: 119 10*3/uL — ABNORMAL LOW (ref 150–400)
RBC: 2.57 MIL/uL — AB (ref 3.87–5.11)
RDW: 18.9 % — ABNORMAL HIGH (ref 11.5–15.5)
WBC: 0.4 10*3/uL — AB (ref 4.0–10.5)

## 2016-04-30 LAB — GLUCOSE, CAPILLARY
GLUCOSE-CAPILLARY: 148 mg/dL — AB (ref 65–99)
GLUCOSE-CAPILLARY: 171 mg/dL — AB (ref 65–99)
GLUCOSE-CAPILLARY: 186 mg/dL — AB (ref 65–99)
GLUCOSE-CAPILLARY: 99 mg/dL (ref 65–99)
GLUCOSE-CAPILLARY: 148 mg/dL — AB (ref 65–99)
GLUCOSE-CAPILLARY: 160 mg/dL — AB (ref 65–99)
GLUCOSE-CAPILLARY: 119 mg/dL — AB (ref 65–99)
Glucose-Capillary: 171 mg/dL — ABNORMAL HIGH (ref 65–99)
Glucose-Capillary: 129 mg/dL — ABNORMAL HIGH (ref 65–99)
Glucose-Capillary: 123 mg/dL — ABNORMAL HIGH (ref 65–99)
Glucose-Capillary: 133 mg/dL — ABNORMAL HIGH (ref 65–99)
Glucose-Capillary: 128 mg/dL — ABNORMAL HIGH (ref 65–99)

## 2016-04-30 LAB — BASIC METABOLIC PANEL
ANION GAP: 5 (ref 5–15)
ANION GAP: 6 (ref 5–15)
BUN: 22 mg/dL — AB (ref 6–20)
BUN: 22 mg/dL — AB (ref 6–20)
CHLORIDE: 105 mmol/L (ref 101–111)
CHLORIDE: 106 mmol/L (ref 101–111)
CO2: 28 mmol/L (ref 22–32)
CO2: 29 mmol/L (ref 22–32)
Calcium: 7.4 mg/dL — ABNORMAL LOW (ref 8.9–10.3)
Calcium: 7.5 mg/dL — ABNORMAL LOW (ref 8.9–10.3)
Creatinine, Ser: 0.41 mg/dL — ABNORMAL LOW (ref 0.44–1.00)
Creatinine, Ser: 0.45 mg/dL (ref 0.44–1.00)
GFR calc Af Amer: >60 mL/min (ref >60)
GFR calc Af Amer: >60 mL/min (ref >60)
GFR calc non Af Amer: >60 mL/min (ref >60)
GFR calc non Af Amer: >60 mL/min (ref >60)
GLUCOSE: 148 mg/dL — AB (ref 65–99)
GLUCOSE: 131 mg/dL — AB (ref 65–99)
POTASSIUM: 3 mmol/L — AB (ref 3.5–5.1)
POTASSIUM: 3.6 mmol/L (ref 3.5–5.1)
Sodium: 138 mmol/L (ref 135–145)
Sodium: 141 mmol/L (ref 135–145)

## 2016-04-30 LAB — MAGNESIUM: Magnesium: 1.8 mg/dL (ref 1.7–2.4)

## 2016-04-30 LAB — PHOSPHORUS: Phosphorus: 1.9 mg/dL — ABNORMAL LOW (ref 2.5–4.6)

## 2016-04-30 MED ORDER — PRO-STAT SUGAR FREE PO LIQD
30.0000 mL | Freq: Two times a day (BID) | ORAL | Status: DC
  Administered 2016-04-30 – 2016-05-01 (×2): 30 mL
  Filled 2016-04-30 (×2): qty 30

## 2016-04-30 MED ORDER — FILGRASTIM 480 MCG/1.6ML IJ SOLN
960.0000 ug | Freq: Every day | INTRAMUSCULAR | Status: DC
  Administered 2016-04-30 – 2016-05-05 (×6): 960 ug via SUBCUTANEOUS
  Filled 2016-04-30 (×12): qty 3.2

## 2016-04-30 MED ORDER — ONDANSETRON HCL 4 MG/2ML IJ SOLN
INTRAMUSCULAR | Status: AC
  Filled 2016-04-30: qty 2

## 2016-04-30 MED ORDER — POTASSIUM CHLORIDE 20 MEQ/15ML (10%) PO SOLN
30.0000 meq | ORAL | Status: AC
  Administered 2016-04-30 (×2): 30 meq
  Filled 2016-04-30 (×2): qty 30

## 2016-04-30 MED ORDER — MIDAZOLAM HCL 2 MG/2ML IJ SOLN
INTRAMUSCULAR | Status: AC
  Administered 2016-04-30: 2 mg
  Filled 2016-04-30: qty 2

## 2016-04-30 MED ORDER — FUROSEMIDE 10 MG/ML IJ SOLN
20.0000 mg | Freq: Every day | INTRAMUSCULAR | Status: DC
Start: 2016-04-30 — End: 2016-05-01
  Administered 2016-04-30: 20 mg via INTRAVENOUS
  Filled 2016-04-30: qty 2

## 2016-04-30 MED ORDER — ONDANSETRON HCL 4 MG/2ML IJ SOLN
4.0000 mg | Freq: Four times a day (QID) | INTRAMUSCULAR | Status: DC | PRN
  Administered 2016-04-30 – 2016-05-10 (×3): 4 mg via INTRAVENOUS
  Filled 2016-04-30 (×2): qty 2

## 2016-04-30 MED ORDER — JEVITY 1.2 CAL PO LIQD: 1000.0000 mL | ORAL | Status: DC

## 2016-04-30 MED ORDER — MIDAZOLAM HCL 2 MG/2ML IJ SOLN: 2.0000 mg | Freq: Once | INTRAMUSCULAR | Status: DC

## 2016-04-30 NOTE — Progress Notes (Addendum)
PULMONARY / CRITICAL CARE MEDICINE   Name: Kristina Meyer MRN: [id number] DOB: 1941-04-26    ADMISSION DATE:  04/23/2016 CONSULTATION DATE:  04/24/16  REFERRING MD:  Dr Carolin Sicks, [place]: HCAP, neutropenic sepsis  SUBJECTIVE:  Some purposeful movements per RN this am. Opened eyes and wiggled toes for me.  Had vomiting of TF > TF held then restarted today.  (+) BM last night.  Still febrile    VITAL SIGNS: BP (!) 137/47   Pulse (!) 109   Temp 100.2 F (37.9 C)   Resp (!) 22   Ht 5\' 2"  (1.575 m)   Wt 69.1 kg (152 lb 5.4 oz)   SpO2 100%   BMI 27.86 kg/m   HEMODYNAMICS:    VENTILATOR SETTINGS: Vent Mode: PSV FiO2 (%):  [35 %] 35 % Set Rate:  [20 bmp] 20 bmp Vt Set:  [300 mL] 300 mL PEEP:  [8 cmH20] 8 cmH20 Pressure Support:  [5 cmH20] 5 cmH20 Plateau Pressure:  [16 cmH20-20 cmH20] 17 cmH20  INTAKE / OUTPUT: I/O last 3 completed shifts: In: 3475.8 [I.V.:149; NG/GT:1197.5; IV Piggyback:2129.3] Out: [id number] [Urine:2680; Emesis/NG output:125; Chest Tube:230]  PHYSICAL EXAMINATION: General:  Ill appearing elderly woman, sedated on vent. Follows simple Neuro:  Sedated on vent, non-focal generalized weakness HEENT:  OP clear, PERRL, orally intubated   Cardiovascular:  Tachycardic, regular Lungs:    + equal chest rise decreased bilateral. Left chest tube w/ serous sang drainage. Crackles bibasilar.  Abdomen:  Benign no OM + bowel sounds Musculoskeletal:  Gr 2 edema.  Skin:  No rash or mottling   LABS:  BMET  Recent Labs Lab 04/29/16 0447 04/29/16 1821 04/30/16 0425  NA 135 136 138  K 3.4* 3.0* 3.0*  CL 106 105 105  CO2 22 24 28   BUN 24* 22* 22*  CREATININE 0.50 0.48 0.41*  GLUCOSE 164* 150* 148*    Electrolytes  Recent Labs Lab 04/28/16 1702 04/29/16 0447 04/29/16 1821 04/30/16 0425  CALCIUM  --  7.3* 7.1* 7.4*  MG 2.0 2.1 1.9  --   PHOS 2.5 2.1* 2.9  --     CBC  Recent Labs Lab 04/28/16 0416 04/29/16 0447 04/30/16 0425  WBC  0.4* 0.4* 0.4*  HGB 8.1* 8.6* 8.1*  HCT 23.4* 24.9* 23.7*  PLT 53* 79* 119*    Coag's No results for input(s): APTT, INR in the last 168 hours.  Sepsis Markers  Recent Labs Lab 04/24/16 1515 04/24/16 1519 04/25/16 0511 04/26/16 0349  LATICACIDVEN  --  1.1 0.9  --   PROCALCITON 3.70  --  3.65 5.22    ABG  Recent Labs Lab 04/25/16 1511 04/25/16 1710 04/27/16 0308  PHART 7.419 7.458* 7.405  PCO2ART 34.0 31.3* 36.7  PO2ART 76.5* 84.3 90.6    Liver Enzymes  Recent Labs Lab 04/26/16 0349 04/27/16 0453 04/28/16 0416  AST 17 17 19   ALT 13* 12* 11*  ALKPHOS 69 70 77  BILITOT 1.3* 1.5* 1.4*  ALBUMIN 2.4* 2.0* 2.0*    Cardiac Enzymes No results for input(s): TROPONINI, PROBNP in the last 168 hours.  Glucose  Recent Labs Lab 04/26/16 0319 04/26/16 0805 04/26/16 1122 04/27/16 2259 04/28/16 1929 04/28/16 2341  GLUCAP 115* 168* 123* 133* 149* 152*    Imaging Dg Chest Port 1 View  Result Date: 04/30/2016 CLINICAL DATA:  75 year old hypertensive female with respiratory failure. Subsequent encounter. EXAM: PORTABLE CHEST 1 VIEW COMPARISON:  04/28/2016 FINDINGS: Drain left lower thoracic region projecting over the  diaphragm. No pneumothorax. Endotracheal tube tip 3.5 cm above the carina. Right central line tip mid to distal superior vena cava level. Nasogastric tube courses below the diaphragm. Tip is not included on the present exam. Consolidation peripheral aspect right upper lobe and right lung apex. Left base atelectasis versus infiltrate, possibly with residual small pleural effusion. Mild pulmonary vascular congestion. Top-normal heart size. IMPRESSION: Persistent consolidation right upper lobe and left base. Drain left lower thorax. Electronically Signed   By: Genia Del M.D.   On: 04/30/2016 06:41  much improved aeration on CXR   STUDIES:  CT chest 11/28 >> No definite CT evidence for acute pulmonary embolus or aortic Dissection.  Moderate large left  pleural effusion. Dense left lower lobe consolidation with partial consolidation in the lingula and right lower lobe which may represent pneumonia.  Mild mid esophageal thickening\  Echo 12/3 > EF 35%, hypokinesis (diffuse)  CULTURES: Blood 11/27 >>  (-) Urine 11/27 >> insignificant growth Blood 11/28 >> (-) Urine 11/28 >> (-) Viral resp panel 11/28 >> neg Pleural fluid 11/29>>>no orgs  But abundant wbc  ANTIBIOTICS: Cefepime 11/27 > 11/28 azithro 11/27 >> 12/2 vanco 11/28 >>  meropenem 11/28 >>  anidulafungin 11/30>>> Acyclovir 12/1 >>    SIGNIFICANT EVENTS: 11/29 thora. Incomplete drainage of left pleural space 11/30 Chest tube placed.  12/1 weaning.   LINES/TUBES: Port-s-cath >>  OETT 11/29>>> Left IJ CVL 11/29>>>   ASSESSMENT / PLAN:  PULMONARY A: Acute hypoxic respiratory failure 2/2 Bilateral PNA/HCAP  in the ICH +/- Pulm edema/Diffuse Hypokinesis Complicated left effusion (exudate)-->SP CT guided CT 11/30. Pleural cytology (-) for CA Chest pain P:   Cont full vent support  Monitor CT output  PST daily. Slowly waking up. Anticipate long wean as pt slow to wake up.  See ID section  Cont supportive care  Will need diagnostic bronchoscopy today to get more samples. Explained to husband and he agrees and consents to bronchoscopy.   CARDIOVASCULAR A:  SIRS/sepsis (neutropenic) Transient hypotension, improved w IVF CHE EF 35%, with acute pulm edema and diffuse hypokinesis/demand ischemia P:  Telemetry Gentle diuresis (getting acyclovir which can potentially worsen renal fxn) with lasix 20 mg IV daily Plan to get her better from infection and potentially consult cards re: echo findings.   RENAL A:   Mild acute renal insufficiency, improved w volume P:   Follow BMP and UOP replet lytes Gentle diuresis with lasix 20 IV daily  GASTROINTESTINAL A:   SUP Nutrition Vomiting on 12/3 P:   H2B for SUP Cont tubefeeds. Had BM after vomiting so should be  able to tolerate TF.   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 Cultures (-) RUL and LLL HCAP Complicated left pleural effusion  Severe sepsis without shock P:   She's neutropenic but all cultures to date are negative. Will continue meropenem, vanc and eraxis, acyclovir for now.  Will need Dx bronch today to facilitate with deescalating abx.  Will keep very broad spectrum 2/2 low ANC,  Cont CT drainage of left pleural space  Neosynephrine weaned off 12/2   ENDOCRINE  A:   No hx DM P:   Follow glucose on BMP  NEUROLOGIC A:   Anxiety /  depression Slowly waking up P:   RASS goal: -1 to -2 Paxil held off on 12/3  2/2 decreased sensorium PRN versed and fentanyl.    FAMILY  - Updates: Pt is a full DNR.  Husband and son undated at bedside on 12/4.   - Inter-disciplinary family meet or Palliative Care meeting due by:  12/5   Critical care time with this pt today : 35 minutes.    Monica Becton, MD 04/30/2016, 9:01 AM [facility] Pager [phone] After 3 pm or if no answer, call [phone]

## 2016-04-30 NOTE — Procedures (Signed)
Bronchoscopy Procedure Note Kristina Meyer [id number] [birth year]  Procedure: Bronchoscopy Indications: Obtain specimens for culture and/or other diagnostic studies   Pt with febrile neutropenia.  Need samples.   Procedure Details Consent: Risks of procedure as well as the alternatives and risks of each were explained to the (patient/caregiver).  Consent for procedure obtained. Time Out: Verified patient identification, verified procedure, site/side was marked, verified correct patient position, special equipment/implants available, medications/allergies/relevent history reviewed, required imaging and test results available.  Performed  In preparation for procedure, patient was given 100% FiO2 and bronchoscope lubricated. Sedation: Benzodiazepines > pt received 2 mg Versed IV total for the procedure.   Airway entered and the following bronchi were examined: RUL, RML, RLL, LUL, LLL and Bronchi.   Distal trachea was erythematous and inflammed. Some white plaques adherent into distal trachea, more in 3-6 o'clock position.  Friable mucosa.  RUL mucosa was erythematous and inflammed and friable and had white plaques adherent to the mucosa. Very friable. RML and RLL was less inflammed.  LUL/lingula mucosa was mildly inflammed. LLL mucosa was inflammed and erythematous and friable.  Purulent secretions over at LLL. (-) endobronchial lesions seen.   BAL done over at the LLL with 60 mls saline with more than 50% return.  This was done 2x.  BAL done over at the RUL with 60 mls saline with more than 50% return.   Procedures performed: Brushings performed at RUL.  Bronchoscope removed.  , Patient placed back on 100% FiO2 at conclusion of procedure.    Evaluation Hemodynamic Status: BP stable throughout; O2 sats: stable throughout Patient's Current Condition: stable Specimens:  Sent serosanguinous fluid Complications: No apparent complications Patient did tolerate procedure well.   Will send  samples  for culture and cytology. Will send everything, including AFB.  Since we are sending for AFB, need to order PTB precaution.  Plan d/w nurse.   [place] 04/30/2016

## 2016-04-30 NOTE — Progress Notes (Signed)
Nutrition Follow-up  DOCUMENTATION CODES:   Not applicable  INTERVENTION:  - When TF is able to be restarted: 30 mL Prostat BID and Jevity 1.2 @ 30 mL/hr. Advance TF by 10 mL every 8 hours to reach goal rate of Jevity 1.2 @ 50 mL/hr. - RD will follow-up 12/5.  NUTRITION DIAGNOSIS:   Inadequate oral intake related to inability to eat as evidenced by NPO status. -ongoing  GOAL:   Patient will meet greater than or equal to 90% of their needs -unmet with TF on hold, rate prior to hold.  MONITOR:   TF tolerance, Vent status, Weight trends, Labs, I & O's  ASSESSMENT:   75 y.o. 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 The patient was in her usual state of health, got her Cytovene infusions last last Tuesday, and then over the last 3 or 4 days has developed progressive weakness and "just feeling awful". At first she thought she was just getting anemic again because of how tired she was over the weekend, but then tonight at 2AM she woke with fever to 102F, vomited, had worse malaise and chills and came to the ER.   She has had no dysuria or other urinary irritative symptoms.  She has had a new nonproductive cough this week.  No nose bleeds, bleeding from gums.  12/4 Pt remains intubated with OGT in place. Note from 1630 yesterday states episode of vomiting and TF was placed on hold. Gastric residual checked at ~1645 and TF was restarted at that time: Jevity 1.2 @ 25 mL/hr. TF was placed on hold this AM for bronchoscopy today, per RN report. RN reports prior to TF hold, pt was receiving Jevity 1.2 @ 35 mL/hr (1008 kcal, 47 grams of protein, and 678 mL free water). Estimated kcal need updated this AM and continues to be based on weight from 11/27 (61 kg) as weight +8.1 kg since that time. Goal for TF at this time: Jevity 1.2 @ 50 mL/hr with 30 mL Prostat BID which will provide 1640 kcal (104% estimated kcal need), 103 grams of  protein, and 968 mL free water.  Patient is currently intubated on ventilator support MV: 9.7 L/min Temp (24hrs), Avg:100.9 F (38.3 C), Min:99.5 F (37.5 C), Max:101.8 F (38.8 C) Propofol: none BP: 114/44 and MAP: 67  Medications reviewed; 20 mg IV Pepcid BID, 20 mg IV Lasix/day, sliding scale Novolog, 30 mEq KCl per OGT x2 doses today, 40 mEq IV KPhos x1 dose yesterday.  Labs reviewed; K: 3 mmol/L, BUN: 22 mg/dL, creatinine: 0.41 mg/dL, Ca: 70.4 mg/dL.     12/3 - RD consulted to manage TF.  - Jevity 1.2 was initiated over the weekend. Jevity 1.2 now infusing at 55 ml/hr and will meet goal rate of 65 ml/hr today. - Pt has been receiving 30 ml Prostat BID. Will d/c now that tube feeding is reaching goal rate. - Pt tolerating current TF regimen.  - Pt's weight is now +25 lb since admission 11/27.  Patient is currently intubated on ventilator support MV: 10.6 L/min Temp (24hrs), Avg:100.9 F (38.3 C), Min:99.3 F (37.4 C), Max:102.7 F (39.3 C) Propofol: none   12/1 - RD spoke with RN, pt not expected to extubate and no plans for TF as of right now.  - If TF is desired, please consult RD.   Patient is currently intubated on ventilator support MV: 9.6 L/min Temp (24hrs), Avg:103 F (39.4  C), Min:102 F (38.9 C), Max:103.8 F (39.9 C) Propofol: stopped today    Diet Order:  Diet NPO time specified  Skin:  Reviewed, no issues  Last BM:  12/3  Height:   Ht Readings from Last 1 Encounters:  04/25/16 5\' 2"  (1.575 m)    Weight:   Wt Readings from Last 1 Encounters:  04/30/16 152 lb 5.4 oz (69.1 kg)    Ideal Body Weight:  50 kg  BMI:  Body mass index is 27.86 kg/m.  Estimated Nutritional Needs:   Kcal:  [id number]  Protein:  90-100g  Fluid:  1.7L/day  EDUCATION NEEDS:   No education needs identified at this time    Jarome Matin, MS, RD, LDN, CNSC Inpatient Clinical Dietitian Pager # [phone] After hours/weekend pager # [phone]

## 2016-04-30 NOTE — Progress Notes (Signed)
Surprisingly enough Ms. Tauzin still has neutropenia. I'm just troubled that her white cell count is still so low. I will increase dose of Neupogen to 960 g.  It does not look like her temperature spikes are as high. Hopefully, this is a indicator for some marrow recovery and constitution of her immune system.  Her platelet count looks good. This is coming up well. As such, I have to believe that her total bone marrow function will improve.  So far, cultures are still negative.  Her hemoglobin is 8.1. She is getting close from my opinion that she may need to be transfused. Again, her blood pressure is doing much better. I think she is off pressors.  Her renal function is still nice and stable. Her potassium is on the low side.  I spoke to her husband yesterday. I gave him an update. I really think this all comes down to her own immune system coming back. I would light to believe that this will happen in the next couple days.  There is no real change on physical exam. Her heart rate is quite high at 126. This might be a reflection of her anemia. Again, her blood pressure is doing okay so she may not need to be transfused.  I know that she is trying hard. I hope that once her own immune system comes back, that she will be able to pull through.  I know the staff in the ICU having doing a incredible job tending to her needs and also helping out her family. Her family is incredibly appreciative of the extra effort that is being shown to them.  Lattie Haw, MD  Oswaldo Milian 41:10

## 2016-04-30 NOTE — Progress Notes (Signed)
Patient ID: Kristina Meyer, female   DOB: [birth year], 75 y.o.   MRN: [id number]    Referring Physician(s): Brand Males  Supervising Physician: Sandi Mariscal  Patient Status: [facility] - In-pt  Chief Complaint: Left loculated pleural effusion  Subjective: Patient still intubated and will being getting a bronchoscopy this afternoon.  Allergies: Other  Medications: Prior to Admission medications   Medication Sig Start Date End Date Taking? Authorizing Provider  ALPRAZolam (XANAX) 0.5 MG tablet TAKE 1 TABLET BY MOUTH 3 TIMES A DAY AS NEEDED Patient taking differently: TAKE 1 TABLET BY MOUTH 3 TIMES A DAY AS NEEDED for anxiety. 03/26/16  Yes Volanda Napoleon, MD  aspirin 81 MG tablet Take 81 mg by mouth daily.    Yes Historical Provider, MD  atorvastatin (LIPITOR) 20 MG tablet Take 10 mg by mouth at bedtime.   Yes Historical Provider, MD  Calcium-Magnesium-Vitamin D (CALCIUM 1200+D3 PO) Take 1 tablet by mouth daily.   Yes Historical Provider, MD  cholecalciferol (VITAMIN D) 1000 UNITS tablet Take 1,000 Units by mouth daily.   Yes Historical Provider, MD  hydrochlorothiazide (HYDRODIURIL) 25 MG tablet Take by mouth. 12/08/16  Yes Historical Provider, MD  lidocaine-prilocaine (EMLA) cream Apply 1 application topically as needed. Place quarter sized amount of cream directly on portacath and cover with saran wrap at least 1 - 1 1/2 hours prior to procedure. 01/11/16  Yes Volanda Napoleon, MD  Multiple Vitamins-Minerals (MULTIVITAMIN ADULT) TABS Take 1 tablet by mouth daily.   Yes Historical Provider, MD  orphenadrine (NORFLEX) 100 MG tablet Take 1 tablet (100 mg total) by mouth at bedtime as needed for muscle spasms. 12/29/15  Yes Volanda Napoleon, MD  PARoxetine (PAXIL) 20 MG tablet Take 20 mg by mouth daily.   Yes Historical Provider, MD  potassium chloride SA (K-DUR,KLOR-CON) 20 MEQ tablet Take 1 pill twice a day for 7 days, then 1 pill a day. 02/13/16  Yes Volanda Napoleon, MD  prochlorperazine (COMPAZINE)  5 MG tablet Take 1 tablet (5 mg total) by mouth every 6 (six) hours as needed for nausea or vomiting. 01/05/16  Yes Volanda Napoleon, MD  sulfamethoxazole-trimethoprim (BACTRIM DS) 800-160 MG per tablet Take 1 tablet by mouth daily.   Yes Historical Provider, MD    Vital Signs: BP (!) 99/45   Pulse (!) 118   Temp 98.2 F (36.8 C)   Resp 20   Ht 5\' 2"  (1.575 m)   Wt 152 lb 5.4 oz (69.1 kg)   SpO2 100%   BMI 27.86 kg/m   Physical Exam: Lungs/chest: bilateral crackles.  Chest tube in place with some serosang output. 220cc out yesterday.  Imaging: Dg Chest Port 1 View  Result Date: 04/30/2016 CLINICAL DATA:  75 year old hypertensive female with respiratory failure. Subsequent encounter. EXAM: PORTABLE CHEST 1 VIEW COMPARISON:  04/28/2016 FINDINGS: Drain left lower thoracic region projecting over the diaphragm. No pneumothorax. Endotracheal tube tip 3.5 cm above the carina. Right central line tip mid to distal superior vena cava level. Nasogastric tube courses below the diaphragm. Tip is not included on the present exam. Consolidation peripheral aspect right upper lobe and right lung apex. Left base atelectasis versus infiltrate, possibly with residual small pleural effusion. Mild pulmonary vascular congestion. Top-normal heart size. IMPRESSION: Persistent consolidation right upper lobe and left base. Drain left lower thorax. Electronically Signed   By: Genia Del M.D.   On: 04/30/2016 06:41   Dg Chest Port 1 View  Result Date: 04/28/2016 CLINICAL DATA:  Leukemia. EXAM: PORTABLE CHEST 1 VIEW COMPARISON:  April 27, 2016 FINDINGS: Stable support apparatus. No pneumothorax. Increasing infiltrate in the right upper lung and left base, worsened in the interval. No other interval changes. IMPRESSION: Worsening right upper lobe and left lower lobe infiltrates. Electronically Signed   By: Dorise Bullion III M.D   On: 04/28/2016 07:25   Dg Chest Port 1 View  Result Date: 04/27/2016 CLINICAL DATA:   75 year old female status post CT-guided left chest tube placement yesterday for pleural effusion. Initial encounter. EXAM: PORTABLE CHEST 1 VIEW COMPARISON:  04/26/2016 and earlier. FINDINGS: Portable AP semi upright view at 0904 hours. Stable endotracheal tube tip. Enteric tube side hole is at the level of the stomach. Stable left IJ central line and right chest porta cath (accessed). Pigtail left lower pleural drain is in place with regressed veiling opacity and mildly improved ventilation at the left lung base. Patchy and confluent right upper lobe and perihilar opacity persists. No pneumothorax. No areas of worsening ventilation. IMPRESSION: 1. Left pleural drain with decreased pleural effusion and mildly improved left lung base ventilation. No pneumothorax. 2.  Otherwise stable lines and tubes. 3. Stable right upper lobe and bilateral perihilar pneumonia. Electronically Signed   By: Genevie Kiki M.D.   On: 04/27/2016 09:35   Ct Image Guided Fluid Drain By Catheter  Result Date: 04/27/2016 INDICATION: Left pleural effusion EXAM: CT IMAGE GUIDED FLUID DRAIN BY CATHETER MEDICATIONS: The patient is currently admitted to the hospital and receiving intravenous antibiotics. The antibiotics were administered within an appropriate time frame prior to the initiation of the procedure. ANESTHESIA/SEDATION: None. COMPLICATIONS: None immediate. PROCEDURE: Informed written consent was obtained from the patient after a thorough discussion of the procedural risks, benefits and alternatives. All questions were addressed. Maximal Sterile Barrier Technique was utilized including caps, mask, sterile gowns, sterile gloves, sterile drape, hand hygiene and skin antiseptic. A timeout was performed prior to the initiation of the procedure. In the right decubitus position, the posterior left thorax was prepped and draped in a sterile fashion. Under CT guidance, an 18 gauge needle was advanced into the left pleural effusion and removed  over an Amplatz wire. Twelve [place] dilator followed by a 12 [place] drain were inserted. The drain was looped, string fixed, then sewn to the skin. Serosanguineous fluid was aspirated. FINDINGS: Imaging confirms placement of a left 39 French thoracostomy tube. IMPRESSION: Successful left 12 French thoracostomy. Electronically Signed   By: Marybelle Killings M.D.   On: 04/27/2016 07:42    Labs:  CBC:  Recent Labs  04/27/16 0453 04/28/16 0416 04/29/16 0447 04/30/16 0425  WBC 0.5* 0.4* 0.4* 0.4*  HGB 8.2* 8.1* 8.6* 8.1*  HCT 23.4* 23.4* 24.9* 23.7*  PLT 48* 53* 79* 119*    COAGS:  Recent Labs  01/11/16 1225  INR 1.20    BMP:  Recent Labs  04/28/16 0416 04/29/16 0447 04/29/16 1821 04/30/16 0425  NA 134* 135 136 138  K 3.4* 3.4* 3.0* 3.0*  CL 105 106 105 105  CO2 23 22 24 28   GLUCOSE 130* 164* 150* 148*  BUN 16 24* 22* 22*  CALCIUM 6.9* 7.3* 7.1* 7.4*  CREATININE 0.54 0.50 0.48 0.41*  GFRNONAA >60 >60 >60 >60  GFRAA >60 >60 >60 >60    LIVER FUNCTION TESTS:  Recent Labs  04/24/16 0523 04/25/16 1834 04/26/16 0349 04/27/16 0453 04/28/16 0416  BILITOT 1.8*  --  1.3* 1.5* 1.4*  AST 23  --  17 17 19  ALT 18  --  13* 12* 11*  ALKPHOS 65  --  69 70 77  PROT 6.0* 5.3* 5.1* 4.9* 4.9*  ALBUMIN 3.1*  --  2.4* 2.0* 2.0*    Assessment and Plan: 1. Loculated left pleural effusion, s/p chest tube placement on 11/30 -cont with chest tube, per CCM -bronch today -cont suction for now as output up in the 200cc range yesterday.   Electronically Signed: Henreitta Cea 04/30/2016, 3:15 PM   I spent a total of 15 Minutes at the the patient's bedside AND on the patient's hospital floor or unit, greater than 50% of which was counseling/coordinating care for loculated left pleural effusion

## 2016-04-30 NOTE — Procedures (Signed)
Bedside Bronchoscopy Procedure Note Kristina Meyer [id number] 02/11/1941  Procedure: Bronchoscopy Indications: Obtain specimens for culture and/or other diagnostic studies  Procedure Details: ET Tube Size: 7.5 ET Tube secured at lip (cm):24 Bite block in place: Yes In preparation for procedure, Patient hyper-oxygenated with 100 % FiO2 Airway entered and the following bronchi were examined: RUL, RML, RLL, LUL, LLL and Bronchi.   Bronchoscope removed.  , Patient remained on 100% FiO2 throughout procedure.    Evaluation BP (!) 99/45   Pulse (!) 118   Temp 98.2 F (36.8 C)   Resp 20   Ht 5\' 2"  (1.575 m)   Wt 152 lb 5.4 oz (69.1 kg)   SpO2 100%   BMI 27.86 kg/m  Breath Sounds:Rhonch O2 sats: stable throughout Patient's Current Condition: stable Specimens:  Sent washings & brushings Complications: No apparent complications Patient did tolerate procedure well.   Kathie Dike 04/30/2016, 2:06 PM

## 2016-04-30 NOTE — Progress Notes (Signed)
[facility] ADULT ICU REPLACEMENT PROTOCOL FOR AM LAB REPLACEMENT ONLY  The patient does apply for the [facility] Adult ICU Electrolyte Replacment Protocol based on the criteria listed below:   1. Is GFR >/= 40 ml/min? Yes.    Patient's GFR today is >60 2. Is urine output >/= 0.5 ml/kg/hr for the last 6 hours? Yes.   Patient's UOP is 0.8 ml/kg/hr 3. Is BUN < 60 mg/dL? Yes.    Patient's BUN today is 22 4. Abnormal electrolyte(s): K+3.0 5. Ordered repletion with: Protocol 6. If a panic level lab has been reported, has the CCM MD in charge been notified? No..   Physician:  [place], [place] 04/30/2016 5:25 AM

## 2016-04-30 NOTE — Progress Notes (Signed)
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  Broad abx were started on admission for sepsis and PNA.      Today, 04/30/2016: - day #7 vancomycin and meropenem; day #6 Eraxis; day #3 acyclovir (for broad empiric coverage) - 12/4 CXR: Persistent consolidation right upper lobe and left base - Remains febrile.  PCT rising on 11/30. ANC 0.0 (neupogen dose increased).  - Scr stable (crcl~53)  Plan: - continue acyclovir 670 mg IV q8h (~10 mg/kg/dose) - continue vancomycin dose 750mg  IV q8h Recheck VT at Css tomorrow AM - continue meropenem 1gm IV q8h - continue Eraxis 100 mg IV daily - f/u cultures, renal funct, clinical status  ___________________________  Height: 5\' 2"  (157.5 cm) Weight: 152 lb 5.4 oz (69.1 kg) IBW/kg (Calculated) : 50.1  Temp (24hrs), Avg:101 F (38.3 C), Min:99.7 F (37.6 C), Max:101.8 F (38.8 C)   Recent Labs Lab 04/24/16 1519 04/25/16 0511 04/26/16 0349 04/27/16 0453 04/28/16 0416 04/28/16 0915 04/29/16 0447 04/29/16 1821 04/30/16 0425  WBC  --  0.5* 0.5* 0.5* 0.4*  --  0.4*  --  0.4*  CREATININE  --  0.74 0.78 0.57 0.54  --  0.50 0.48 0.41*  LATICACIDVEN 1.1 0.9  --   --   --   --   --   --   --   VANCOTROUGH  --   --   --   --   --  7*  --  10*  --     Estimated Creatinine Clearance: 53.6 mL/min (by C-G formula based on SCr of 0.41 mg/dL (L)).    Allergies  Allergen Reactions  . Other Other (See Comments)    GENERAL Anesthesia, vomiting    Antimicrobials this admission:  11/27  cefepime >> 11/28 11/28 Vancomycin >> 11/28 Meropenem >> 11/27 azithromycin >> 12/01 11/29 Eraxis (empiric)>> 12/2 acyclovir (empiric)>>  Dose adjustments this admission:  12/2 VT at 0915 =  7 (500 mg q12h) --> increased to 500 mg q8h  Microbiology results:  11/27 BCx x2: NGF 11/27 UCx: insignificant growth 11/27: Legionella antigen: neg 11/27: Strep  antigen: neg 11/28 MRSA PCR (-) 11/28 bcx x2: NGF 11/29 Pleural fluid: NGF 11/29 TA: neg FINAL   Thank you for allowing pharmacy to be a part of this patient's care.  Ralene Bathe, PharmD, BCPS 04/30/2016, 8:38 AM  Pager: [phone]

## 2016-05-01 LAB — PREPARE RBC (CROSSMATCH)

## 2016-05-01 LAB — CBC WITH DIFFERENTIAL/PLATELET
BASOS PCT: 0 %
Basophils Absolute: 0 10*3/uL (ref 0.0–0.1)
EOS PCT: 0 %
Eosinophils Absolute: 0 10*3/uL (ref 0.0–0.7)
HEMATOCRIT: 21.5 % — AB (ref 36.0–46.0)
Hemoglobin: 7.5 g/dL — ABNORMAL LOW (ref 12.0–15.0)
LYMPHS ABS: 0.4 10*3/uL — AB (ref 0.7–4.0)
Lymphocytes Relative: 89 %
MCH: 32.1 pg (ref 26.0–34.0)
MCHC: 34.9 g/dL (ref 30.0–36.0)
MCV: 91.9 fL (ref 78.0–100.0)
MONO ABS: 0 10*3/uL — AB (ref 0.1–1.0)
Monocytes Relative: 0 %
NEUTROS ABS: 0.1 10*3/uL — AB (ref 1.7–7.7)
Neutrophils Relative %: 11 %
PLATELETS: 165 10*3/uL (ref 150–400)
RBC: 2.34 MIL/uL — ABNORMAL LOW (ref 3.87–5.11)
RDW: 18.9 % — AB (ref 11.5–15.5)
WBC: 0.5 10*3/uL — CL (ref 4.0–10.5)

## 2016-05-01 LAB — BASIC METABOLIC PANEL
ANION GAP: 4 — AB (ref 5–15)
Anion gap: 8 (ref 5–15)
BUN: 24 mg/dL — AB (ref 6–20)
BUN: 25 mg/dL — ABNORMAL HIGH (ref 6–20)
CALCIUM: 7.5 mg/dL — AB (ref 8.9–10.3)
CO2: 28 mmol/L (ref 22–32)
CO2: 30 mmol/L (ref 22–32)
Calcium: 7.3 mg/dL — ABNORMAL LOW (ref 8.9–10.3)
Chloride: 105 mmol/L (ref 101–111)
Chloride: 104 mmol/L (ref 101–111)
Creatinine, Ser: 0.47 mg/dL (ref 0.44–1.00)
Creatinine, Ser: 0.42 mg/dL — ABNORMAL LOW (ref 0.44–1.00)
GFR calc Af Amer: >60 mL/min (ref >60)
GFR calc Af Amer: >60 mL/min (ref >60)
GFR calc non Af Amer: >60 mL/min (ref >60)
GLUCOSE: 121 mg/dL — AB (ref 65–99)
GLUCOSE: 158 mg/dL — AB (ref 65–99)
POTASSIUM: 3.8 mmol/L (ref 3.5–5.1)
Potassium: 3.6 mmol/L (ref 3.5–5.1)
Sodium: 141 mmol/L (ref 135–145)
Sodium: 138 mmol/L (ref 135–145)

## 2016-05-01 LAB — ACID FAST SMEAR (AFB)
ACID FAST SMEAR - AFSCU2: NEGATIVE
ACID FAST SMEAR - AFSCU2: NEGATIVE

## 2016-05-01 LAB — ACID FAST SMEAR (AFB, MYCOBACTERIA)

## 2016-05-01 LAB — VANCOMYCIN, TROUGH: Vancomycin Tr: 17 ug/mL (ref 15–20)

## 2016-05-01 LAB — GLUCOSE, CAPILLARY
GLUCOSE-CAPILLARY: 128 mg/dL — AB (ref 65–99)
Glucose-Capillary: 106 mg/dL — ABNORMAL HIGH (ref 65–99)
Glucose-Capillary: 121 mg/dL — ABNORMAL HIGH (ref 65–99)
Glucose-Capillary: 132 mg/dL — ABNORMAL HIGH (ref 65–99)
Glucose-Capillary: 136 mg/dL — ABNORMAL HIGH (ref 65–99)
Glucose-Capillary: 93 mg/dL (ref 65–99)

## 2016-05-01 LAB — MAGNESIUM: Magnesium: 1.9 mg/dL (ref 1.7–2.4)

## 2016-05-01 LAB — PHOSPHORUS
Phosphorus: 2.1 mg/dL — ABNORMAL LOW (ref 2.5–4.6)
Phosphorus: 3 mg/dL (ref 2.5–4.6)

## 2016-05-01 MED ORDER — VITAL AF 1.2 CAL PO LIQD
1000.0000 mL | ORAL | Status: DC
  Administered 2016-05-01 – 2016-05-02 (×2): 1000 mL
  Filled 2016-05-01 (×3): qty 1000

## 2016-05-01 MED ORDER — MIDAZOLAM HCL 2 MG/2ML IJ SOLN
1.0000 mg | INTRAMUSCULAR | Status: DC | PRN
  Administered 2016-05-01 – 2016-05-10 (×19): 1 mg via INTRAVENOUS
  Filled 2016-05-01 (×21): qty 2

## 2016-05-01 MED ORDER — HEPARIN SOD (PORK) LOCK FLUSH 100 UNIT/ML IV SOLN
500.0000 [IU] | INTRAVENOUS | Status: AC | PRN
  Administered 2016-05-01: 500 [IU]

## 2016-05-01 MED ORDER — POTASSIUM CHLORIDE 2 MEQ/ML IV SOLN
30.0000 meq | Freq: Once | INTRAVENOUS | Status: AC
  Administered 2016-05-01: 30 meq via INTRAVENOUS
  Filled 2016-05-01: qty 15

## 2016-05-01 MED ORDER — POTASSIUM PHOSPHATES 15 MMOLE/5ML IV SOLN
30.0000 mmol | Freq: Once | INTRAVENOUS | Status: AC
  Administered 2016-05-01: 30 mmol via INTRAVENOUS
  Filled 2016-05-01: qty 10

## 2016-05-01 MED ORDER — FENTANYL CITRATE (PF) 100 MCG/2ML IJ SOLN
INTRAMUSCULAR | Status: AC
  Filled 2016-05-01: qty 2

## 2016-05-01 MED ORDER — FENTANYL CITRATE (PF) 100 MCG/2ML IJ SOLN
50.0000 ug | INTRAMUSCULAR | Status: DC | PRN
  Administered 2016-05-01 – 2016-05-02 (×5): 50 ug via INTRAVENOUS
  Filled 2016-05-01 (×4): qty 2

## 2016-05-01 MED ORDER — FUROSEMIDE 10 MG/ML IJ SOLN
20.0000 mg | Freq: Two times a day (BID) | INTRAMUSCULAR | Status: DC
  Administered 2016-05-01 – 2016-05-02 (×3): 20 mg via INTRAVENOUS
  Filled 2016-05-01 (×4): qty 2

## 2016-05-01 NOTE — Progress Notes (Signed)
Nutrition Follow-up  DOCUMENTATION CODES:   Not applicable  INTERVENTION:  - Will order Vital 1.2 @ 15 mL/hr which will provide 432 kcal, 27 grams of protein, and 292 mL free water. (communicated this with RN) - RD will follow-up 12/6.  NUTRITION DIAGNOSIS:   Inadequate oral intake related to inability to eat as evidenced by NPO status. -ongoing  GOAL:   Patient will meet greater than or equal to 90% of their needs -unable to meet with TF currently on hold.   MONITOR:   TF tolerance, Vent status, Weight trends, Labs, I & O's  ASSESSMENT:   75 y.o. 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 The patient was in her usual state of health, got her Cytovene infusions last last Tuesday, and then over the last 3 or 4 days has developed progressive weakness and "just feeling awful". At first she thought she was just getting anemic again because of how tired she was over the weekend, but then tonight at 2AM she woke with fever to 102F, vomited, had worse malaise and chills and came to the ER.   She has had no dysuria or other urinary irritative symptoms.  She has had a new nonproductive cough this week.  No nose bleeds, bleeding from gums.  12/5 Bronchoscopy performed yesterday with associated note at 1403. Per Dr. Antonieta Pert note this AM, possible repeat bone marrow testing later this week. Pt remains intubated with OGT in place. Dr. Corrie Dandy' note from this AM states plan to provide trickle rate TF with ongoing vomiting. Pt has been having BMs and last BM was yesterday. Spoke with Dr. Corrie Dandy and plan is for TF at 10-20 mL and to hold at this rate for today; will check again tomorrow.  Based on current estimated nutrition needs, goal rate for TF will be: Vital 1.2 @ 55 mL/hr which will provide 1584 kcal (102% estimated kcal need), 99 grams of protein, and 1070 mL free water. When TF able to be advanced above trickle rate, will  advance slowly to continue to monitor tolerance and because pt has been several days without goal rate of TF. Weight +7.7 kg since admission; continue to use admission weight of 61 kg to estimate nutrition needs.  Patient is currently intubated on ventilator support MV: 9.2 L/min Temp (24hrs), Avg:99.9 F (37.7 C), Min:97.7 F (36.5 C), Max:101.7 F (38.7 C) Propofol: none BP: 124/46 and MAP: 72  Medications reviewed; 20 mg IV Pepcid BID, 20 mg IV Lasix BID, sliding scale Novolog, PRN IV Zofran, 30 mEq IV KPhos x1 dose today. Labs reviewed; CBGs: 106 and 121 mg/dL this AM, BUN: 24 mg/dL, Ca: 7.5 mg/dL, Phos: 2.1 mg/dL.   12/4 - Pt remains intubated with OGT in place.  - Note from 1630 yesterday states episode of vomiting and TF was placed on hold.  - Gastric residual checked at ~1645 and TF was restarted at that time: Jevity 1.2 @ 25 mL/hr. - TF was placed on hold this AM for bronchoscopy today, per RN report.  - RN reports prior to TF hold, pt was receiving Jevity 1.2 @ 35 mL/hr (1008 kcal, 47 grams of protein, and 678 mL free water).  - Estimated kcal need updated this AM and continues to be based on weight from 11/27 (61 kg) as weight +8.1 kg since that time.  - Goal for TF at this time: Jevity 1.2 @ 50 mL/hr with 30 mL  Prostat BID which will provide 1640 kcal (104% estimated kcal need), 103 grams of protein, and 968 mL free water.  Patient is currently intubated on ventilator support MV: 9.7 L/min Temp (24hrs), Avg:100.9 F (38.3 C), Min:99.5 F (37.5 C), Max:101.8 F (38.8 C) Propofol: none BP: 114/44 and MAP: 67   12/3 - RD consulted to manage TF.  - Jevity 1.2 was initiated over the weekend. Jevity 1.2 now infusing at 55 ml/hr and will meet goal rate of 65 ml/hr today. - Pt has been receiving 30 ml Prostat BID. Will d/c now that tube feeding is reaching goal rate. - Pt tolerating current TF regimen.  - Pt's weight is now +25 lb since admission 11/27.  Patient is  currently intubated on ventilator support MV: 10.6L/min Temp (24hrs), Avg:100.9 F (38.3 C), Min:99.3 F (37.4 C), Max:102.7 F (39.3 C) Propofol: none   Diet Order:  Diet NPO time specified  Skin:  Reviewed, no issues  Last BM:  12/4  Height:   Ht Readings from Last 1 Encounters:  04/25/16 5\' 2"  (1.575 m)    Weight:   Wt Readings from Last 1 Encounters:  05/01/16 151 lb 7.3 oz (68.7 kg)    Ideal Body Weight:  50 kg  BMI:  Body mass index is 27.7 kg/m.  Estimated Nutritional Needs:   Kcal:  [id number]  Protein:  90-100g  Fluid:  1.7L/day  EDUCATION NEEDS:   No education needs identified at this time    Jarome Matin, MS, RD, LDN, CNSC Inpatient Clinical Dietitian Pager # [phone] After hours/weekend pager # [phone]

## 2016-05-01 NOTE — Progress Notes (Signed)
[facility] ADULT ICU REPLACEMENT PROTOCOL FOR AM LAB REPLACEMENT ONLY  The patient does apply for the [facility] Adult ICU Electrolyte Replacment Protocol based on the criteria listed below:   1. Is GFR >/= 40 ml/min? Yes.    Patient's GFR today is > 2. Is urine output >/= 0.5 ml/kg/hr for the last 6 hours? Yes.   Patient's UOP is 0.80 ml/kg/hr 3. Is BUN < 60 mg/dL? Yes.    Patient's BUN today is 24 4. Abnormal electrolyte(s):Potassium 3.6 5. Ordered repletion with: Potassium per protocol 6. If a panic level lab has been reported, has the CCM MD in charge been notified? No..   Physician:    Conley Canal P 05/01/2016 6:06 AM

## 2016-05-01 NOTE — Progress Notes (Signed)
eLink Physician-Brief Progress Note Patient Name: Kristina Meyer DOB: 03/02/1941 MRN: [id number]   Date of Service  05/01/2016  HPI/Events of Note  Agitation  eICU Interventions  Doubled fentanyl frequency Versed 1 mg IV q2 hours PRN     Intervention Category Major Interventions: Other:  YACOUB,WESAM 05/01/2016, 3:23 PM

## 2016-05-01 NOTE — Progress Notes (Signed)
Kristina Meyer is now in isolation. She had a bronchoscopy yesterday. She is on isolation as part of the potential TB exposure protocol. I don't think she has tuberculosis as the source of this pneumonia. However, I certainly understand the reason for the isolation.  Of note, her temperature spikes are coming down. Her MAXIMUM TEMPERATURE yesterday was 101.3. Currently it is 100.8.  Her white cell count is 0.5. Hopefully, this is a sign of her marrow recovering. Her platelet count is 165,000.  Hemoglobin is down to 7.5. She needs to be transfused. I would give her 1 unit of blood. Her renal function is still stable. She is getting tube feeds. The tube feeds are on hold right now. Her potassium is 3.6.  She still has the chest tube in the left side.  Her chest x-ray yesterday showed no change. Again, hopefully, this is a sign that her own immune system might be coming back.  On her physical exam, her heart rate is about 138. Her blood pressure is 138/75. She is off pressor support. This might be from her being anemic and was some low-grade temperature. Her lungs sound pretty good. There might be some decrease over on the left side. Cardiac exam is tachycardic but regular. Abdomen is soft. Bowel sounds are slightly decreased. Extremities shows some edema in upper or lower extremities.  She is on full antibiotic coverage. I have increased her Neupogen. Again, I have to believe that her bone marrow function is starting to come back.  I probably would plan for another bone marrow test on her later on this week.  As always, the staff in the ICU or doing a fantastic job in caring for her and her family. Your compassion clearly is evident!!!!  Jimmye Norman, MD  Romans 9:15

## 2016-05-01 NOTE — Progress Notes (Signed)
Brief Pharmacy note:  Vancomycin   VT=17 mg/L on 750mg  IV q8h  At desired goal of 15-20 mg/L  Plan: Continue Vancomycin 750mg  IV q8h F/u SCr/additional levels as needed  Thanks Dorrene German 05/01/2016 4:22 AM

## 2016-05-01 NOTE — Progress Notes (Signed)
PULMONARY / CRITICAL CARE MEDICINE   Name: Kristina Meyer MRN: [id number] DOB: June 17, 1940    ADMISSION DATE:  04/23/2016 CONSULTATION DATE:  04/24/16  REFERRING MD:  Dr Carolin Sicks, [place]: HCAP, neutropenic sepsis  SUBJECTIVE:  No issues over night.  Some vomiting this am. Had BM 12/4.  Awake, follows commands.  Still febrile.  Getting transfused.    VITAL SIGNS: BP (!) 120/42   Pulse (!) 125   Temp (!) 101.7 F (38.7 C) (Core (Comment)) Comment (Src): foley  Resp (!) 31   Ht 5\' 2"  (1.575 m)   Wt 68.7 kg (151 lb 7.3 oz)   SpO2 100%   BMI 27.70 kg/m   HEMODYNAMICS:    VENTILATOR SETTINGS: Vent Mode: PRVC FiO2 (%):  [35 %-40 %] 40 % Set Rate:  [20 bmp] 20 bmp Vt Set:  [300 mL] 300 mL PEEP:  [5 cmH20-8 cmH20] 5 cmH20 Pressure Support:  [5 cmH20] 5 cmH20 Plateau Pressure:  [15 cmH20-17 cmH20] 15 cmH20  INTAKE / OUTPUT: I/O last 3 completed shifts: In: 2210.2 [I.V.:90; NG/GT:400; IV Piggyback:1720.2] Out: 2380 [Urine:2190; Emesis/NG output:20; Chest Tube:170]  PHYSICAL EXAMINATION: General:  Ill appearing elderly woman, sedated on vent. Follows simple commands.  Neuro:  Sedated on vent, non-focal generalized weakness HEENT:  OP clear, PERRL, orally intubated   Cardiovascular:  Tachycardic, regular Lungs:    + equal chest rise decreased bilateral. Left chest tube w/ serous sang drainage. Crackles bibasilar.  Abdomen:  Benign no OM + bowel sounds Musculoskeletal:  Gr 2 edema. More edema in BUE.  Skin:  No rash or mottling   LABS:  BMET  Recent Labs Lab 04/30/16 0425 04/30/16 1600 05/01/16 0347  NA 138 141 141  K 3.0* 3.6 3.6  CL 105 106 105  CO2 28 29 28   BUN 22* 22* 24*  CREATININE 0.41* 0.45 0.47  GLUCOSE 148* 131* 121*    Electrolytes  Recent Labs Lab 04/29/16 1821 04/30/16 0425 04/30/16 0948 04/30/16 1600 05/01/16 0347  CALCIUM 7.1* 7.4*  --  7.5* 7.5*  MG 1.9  --  1.8  --  1.9  PHOS 2.9  --  1.9*  --  2.1*     CBC  Recent Labs Lab 04/29/16 0447 04/30/16 0425 05/01/16 0347  WBC 0.4* 0.4* 0.5*  HGB 8.6* 8.1* 7.5*  HCT 24.9* 23.7* 21.5*  PLT 79* 119* 165    Coag's No results for input(s): APTT, INR in the last 168 hours.  Sepsis Markers  Recent Labs Lab 04/24/16 1515 04/24/16 1519 04/25/16 0511 04/26/16 0349  LATICACIDVEN  --  1.1 0.9  --   PROCALCITON 3.70  --  3.65 5.22    ABG  Recent Labs Lab 04/25/16 1511 04/25/16 1710 04/27/16 0308  PHART 7.419 7.458* 7.405  PCO2ART 34.0 31.3* 36.7  PO2ART 76.5* 84.3 90.6    Liver Enzymes  Recent Labs Lab 04/26/16 0349 04/27/16 0453 04/28/16 0416  AST 17 17 19   ALT 13* 12* 11*  ALKPHOS 69 70 77  BILITOT 1.3* 1.5* 1.4*  ALBUMIN 2.4* 2.0* 2.0*    Cardiac Enzymes No results for input(s): TROPONINI, PROBNP in the last 168 hours.  Glucose  Recent Labs Lab 04/30/16 1252 04/30/16 1632 04/30/16 1938 04/30/16 2309 05/01/16 0347 05/01/16 0731  GLUCAP 160* 133* 128* 119* 106* 121*    Imaging No results found.much improved aeration on CXR   STUDIES:  CT chest 11/28 >> No definite CT evidence for acute pulmonary embolus or aortic Dissection.  Moderate large left pleural effusion. Dense left lower lobe consolidation with partial consolidation in the lingula and right lower lobe which may represent pneumonia.  Mild mid esophageal thickening\  Echo 12/3 > EF 35%, hypokinesis (diffuse)  CULTURES: Blood 11/27 >>  (-) Urine 11/27 >> insignificant growth Blood 11/28 >> (-) Urine 11/28 >> (-) Viral resp panel 11/28 >> neg Pleural fluid 11/29>>>no orgs  But abundant wbc BAL sent for everything 12/4 >   ANTIBIOTICS: Cefepime 11/27 > 11/28 azithro 11/27 >> 12/2 vanco 11/28 >>  meropenem 11/28 >>  anidulafungin 11/30>>> Acyclovir 12/1 >>    SIGNIFICANT EVENTS: 11/29 thora. Incomplete drainage of left pleural space 11/30 Chest tube placed.  12/4 Bronch  LINES/TUBES: Port-s-cath >>  OETT 11/29>>> Left IJ  CVL 11/29>>>   ASSESSMENT / PLAN:  PULMONARY A: Acute hypoxic respiratory failure 2/2 Bilateral PNA/HCAP  in the ICH +/- Pulm edema/Diffuse Hypokinesis. 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  PTB being R/O as well. Possible recent exposure to a neighbor with Latent PTB Complicated left effusion (exudate)-->SP CT guided CT 11/30. Pleural cytology (-) for CA Chest pain P:   Cont full vent support. Not ready for weaning.  As mentioned, husband siad they have a neighbor who was treated for latent PTB 5 mos ago. PTB being R/O. Sent AFB x 2 from bronch on 12/4 and will send another sputum afb today per ID nurse recommendation.  All cultures (-) so far.  Monitor CT output  PST daily. Slowly waking up. Anticipate long wean as pt slow to wake up.  See ID section  Cont supportive care  Will increase diuresis to lasix 20 iv q12.  Not too agresive as pt is on acyclovir.   CARDIOVASCULAR A:  SIRS/sepsis (neutropenic) Transient hypotension, improved w IVF CHE EF 35%, with acute pulm edema and diffuse hypokinesis/demand ischemia P:  Telemetry Gentle diuresis (getting acyclovir which can potentially worsen renal fxn) with lasix 20 mg IV BID Plan to get her better from infection and potentially consult cards re: echo findings.   RENAL A:   Mild acute renal insufficiency, improved w volume P:   Follow BMP and UOP replet lytes Gentle diuresis with lasix 20 IV BID  GASTROINTESTINAL A:   SUP Nutrition Vomiting on 12/3 P:   H2B for SUP Cont tubefeeds. Will do trickle feed given episodes of vomiting.  If she vomits again, plan to hold off on TF.   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   INFECTIOUS A:    Febrile neutropenia, persistently neutropenic despite broad spectrum abx. Cultures (-) RUL and LLL HCAP Concern for Opportunistic infection given persistent neutropenia and AML and recent chemo R/O PTB as well given recent possible exposure to a neighbor with latent PTB Complicated left pleural effusion  Severe sepsis without shock P:   She's neutropenic but all cultures to date are negative. Will continue meropenem, vanc and eraxis, acyclovir for now.  F/U on BAL cultures.  Keep on PTB isolation.  Cont CT drainage of left pleural space  Neosynephrine weaned off 12/2. May resume if BP drops.   ENDOCRINE  A:   No hx DM P:   Follow glucose on BMP  NEUROLOGIC A:   Anxiety / depression Slowly waking up  P:   RASS goal: -1 to -2 Paxil held off on 12/3  2/2 decreased sensorium PRN versed and fentanyl.    FAMILY  - Updates: Pt is a full DNR.  Husband and son undated at bedside on 12/5.   - Inter-disciplinary family meet or Palliative Care meeting due by:  12/5   Critical care time with this pt today : 30 minutes.    Monica Becton, MD 05/01/2016, 10:50 AM [facility] Pager [phone] After 3 pm or if no answer, call [phone]

## 2016-05-02 ENCOUNTER — Other Ambulatory Visit: Payer: Medicare Other

## 2016-05-02 ENCOUNTER — Inpatient Hospital Stay (HOSPITAL_COMMUNITY): Payer: Medicare Other

## 2016-05-02 LAB — CBC WITH DIFFERENTIAL/PLATELET
BASOS ABS: 0 10*3/uL (ref 0.0–0.1)
Basophils Relative: 0 %
EOS ABS: 0 10*3/uL (ref 0.0–0.7)
Eosinophils Relative: 0 %
HEMATOCRIT: 23.5 % — AB (ref 36.0–46.0)
Hemoglobin: 7.9 g/dL — ABNORMAL LOW (ref 12.0–15.0)
LYMPHS ABS: 0.6 10*3/uL — AB (ref 0.7–4.0)
Lymphocytes Relative: 85 %
MCH: 31.1 pg (ref 26.0–34.0)
MCHC: 33.6 g/dL (ref 30.0–36.0)
MCV: 92.5 fL (ref 78.0–100.0)
MONO ABS: 0 10*3/uL — AB (ref 0.1–1.0)
Monocytes Relative: 7 %
NEUTROS ABS: 0.1 10*3/uL — AB (ref 1.7–7.7)
Neutrophils Relative %: 8 %
PLATELETS: 230 10*3/uL (ref 150–400)
RBC: 2.54 MIL/uL — AB (ref 3.87–5.11)
RDW: 18.4 % — AB (ref 11.5–15.5)
WBC: 0.7 10*3/uL — AB (ref 4.0–10.5)

## 2016-05-02 LAB — GLUCOSE, CAPILLARY
GLUCOSE-CAPILLARY: 118 mg/dL — AB (ref 65–99)
GLUCOSE-CAPILLARY: 111 mg/dL — AB (ref 65–99)
GLUCOSE-CAPILLARY: 120 mg/dL — AB (ref 65–99)
Glucose-Capillary: 115 mg/dL — ABNORMAL HIGH (ref 65–99)
Glucose-Capillary: 118 mg/dL — ABNORMAL HIGH (ref 65–99)
Glucose-Capillary: 118 mg/dL — ABNORMAL HIGH (ref 65–99)

## 2016-05-02 LAB — TYPE AND SCREEN
Blood Product Expiration Date: 201712142359
ISSUE DATE / TIME: 201712051027
UNIT TYPE AND RH: 6200

## 2016-05-02 LAB — BASIC METABOLIC PANEL
ANION GAP: 5 (ref 5–15)
BUN: 21 mg/dL — ABNORMAL HIGH (ref 6–20)
CALCIUM: 6.9 mg/dL — AB (ref 8.9–10.3)
CO2: 32 mmol/L (ref 22–32)
CREATININE: 0.5 mg/dL (ref 0.44–1.00)
Chloride: 102 mmol/L (ref 101–111)
Glucose, Bld: 137 mg/dL — ABNORMAL HIGH (ref 65–99)
Potassium: 3.5 mmol/L (ref 3.5–5.1)
SODIUM: 139 mmol/L (ref 135–145)

## 2016-05-02 LAB — MAGNESIUM: Magnesium: 1.6 mg/dL — ABNORMAL LOW (ref 1.7–2.4)

## 2016-05-02 LAB — PHOSPHORUS: PHOSPHORUS: 2.7 mg/dL (ref 2.5–4.6)

## 2016-05-02 LAB — POTASSIUM: POTASSIUM: 3.8 mmol/L (ref 3.5–5.1)

## 2016-05-02 LAB — ACID FAST SMEAR (AFB): ACID FAST SMEAR - AFSCU2: NEGATIVE

## 2016-05-02 LAB — ACID FAST SMEAR (AFB, MYCOBACTERIA)

## 2016-05-02 MED ORDER — POTASSIUM CHLORIDE 20 MEQ/15ML (10%) PO SOLN
40.0000 meq | Freq: Once | ORAL | Status: AC
  Administered 2016-05-02: 40 meq
  Filled 2016-05-02: qty 30

## 2016-05-02 MED ORDER — FENTANYL CITRATE (PF) 100 MCG/2ML IJ SOLN
50.0000 ug | Freq: Once | INTRAMUSCULAR | Status: AC
  Administered 2016-05-02: 50 ug via INTRAVENOUS

## 2016-05-02 MED ORDER — SODIUM CHLORIDE 0.9 % IV SOLN
25.0000 ug/h | INTRAVENOUS | Status: DC
  Administered 2016-05-02: 100 ug/h via INTRAVENOUS
  Administered 2016-05-03: 175 ug/h via INTRAVENOUS
  Administered 2016-05-03: 100 ug/h via INTRAVENOUS
  Administered 2016-05-04: 200 ug/h via INTRAVENOUS
  Administered 2016-05-05: 50 ug/h via INTRAVENOUS
  Administered 2016-05-07: 100 ug/h via INTRAVENOUS
  Administered 2016-05-10: 75 ug/h via INTRAVENOUS
  Filled 2016-05-02 (×8): qty 50

## 2016-05-02 MED ORDER — MAGNESIUM SULFATE 2 GM/50ML IV SOLN
2.0000 g | Freq: Once | INTRAVENOUS | Status: AC
  Administered 2016-05-02: 2 g via INTRAVENOUS
  Filled 2016-05-02: qty 50

## 2016-05-02 MED ORDER — FENTANYL BOLUS VIA INFUSION
25.0000 ug | INTRAVENOUS | Status: DC | PRN
  Administered 2016-05-02 – 2016-05-09 (×20): 25 ug via INTRAVENOUS
  Filled 2016-05-02: qty 25

## 2016-05-02 NOTE — Progress Notes (Signed)
Kristina Meyer white cell count started to come back now. It is 0.7. This should be good sign for bone marrow recovery. Her platelet count is 230,000. She got 1 unit of blood cells yesterday. Her hemoglobin is 7.9.  It looks like she has some cellulitis on the right arm. This is in the forearm. Looks like this may be where she had an IV.  She still is on the ventilator. She is off pressors. She is still on full antibiotic support.  She had a chest x-ray done today. Everything looks somewhat stable. She has a bilateral pneumonia. There may be some atelectasis. She does have the left chest tube in place.  She does have the feeding tube in. She is on tube feeds at 15 mL an hour.  So far, all of her cultures have been negative.  Hopefully, as her white cell count continues to increase, we can cut back on some of the antibiotics.  She still is having some temperature spikes. Hopefully, the trend is slowly downward.  She still has some tachycardia. Her blood pressure is doing okay.  On her physical exam, she has more edema in her arms and legs. Again, it looks like she has some cellulitis down in the right forearm.  Hopefully, she will be Odo pull out of this. I know that her white cell counts been down quite a while. She still on Neupogen. I'll continue Neupogen until her [place] gets above 1000.  As always, the staff in the ICU are doing a fantastic job with her.  Lattie Haw, MD  [place] 21:4

## 2016-05-02 NOTE — Progress Notes (Signed)
Date:  May 02, 2016 Chart reviewed for concurrent status and case management needs. Will continue to follow patient progress. Remains on full vent support/temp101.2 Discharge Planning: following for needs Expected discharge date: [id number] Velva Harman, BSN, [place], [place]

## 2016-05-02 NOTE — Progress Notes (Signed)
Nutrition Follow-up  DOCUMENTATION CODES:   Not applicable  INTERVENTION:  - Continue Vital 1.2 @ 15 mL/hr.  - RD will follow-up 12/7.  NUTRITION DIAGNOSIS:   Inadequate oral intake related to inability to eat as evidenced by NPO status. -ongoing  GOAL:   Patient will meet greater than or equal to 90% of their needs -unable to meet with trickle TF rate.   MONITOR:   TF tolerance, Vent status, Weight trends, Labs, I & O's  ASSESSMENT:   75 y.o. 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 The patient was in her usual state of health, got her Cytovene infusions last last Tuesday, and then over the last 3 or 4 days has developed progressive weakness and "just feeling awful". At first she thought she was just getting anemic again because of how tired she was over the weekend, but then tonight at 2AM she woke with fever to 102F, vomited, had worse malaise and chills and came to the ER.   She has had no dysuria or other urinary irritative symptoms.  She has had a new nonproductive cough this week.  No nose bleeds, bleeding from gums.  12/6 Pt continues with OGT and is receiving Vital 1.2 @ 15 mL/hr which is providing 432 kcal, 27 grams of protein, and 292 mL free water. Spoke with Dr. Corrie Dandy earlier this AM and plan is to continue at this rate until tomorrow AM. RN reports pt had a BM yesterday and has had no further vomiting, to her knowledge, since re-start of TF yesterday afternoon. Dr. Corrie Dandy' note from this AM states pt is not yet ready to trial vent weaning. Pt is +9 kg since admission; will continue to monitor weight trends.  Patient is currently intubated on ventilator support MV: 11 L/min Temp (24hrs), Avg:100.3 F (37.9 C), Min:99.1 F (37.3 C), Max:101.1 F (38.4 C) BP: 144/61 and MAP: 88.  Medications reviewed; 20 mg IV Pepcid BID, 20 mg IV Lasix BID, sliding scale Novolog, 2 g IV Mg sulfate x1 dose  today, PRN IV Zofran, 40 mEq KCl per OGT x1 dose today, 30 mmol IV KPhos x1 dose yesterday.  Labs reviewed; CBGs: 111 and 118 mg/dL this AM, BUN: 21 mg/dL, Ca: 6.9 mg/dL, Mg: 1.6 mg/dL. K and Phos are low end of normal.    12/5 - Bronchoscopy performed yesterday with associated note at 1403.  - Per Dr. Antonieta Pert note this AM, possible repeat bone marrow testing later this week.  - Dr. Corrie Dandy' note from this AM states plan to provide trickle rate TF with ongoing vomiting.  - Pt has been having BMs and last BM was yesterday.  - Spoke with Dr. Corrie Dandy and plan is for TF at 10-20 mL and to hold at this rate for today (will order rate of 15 mL/hr given goal rate). - Based on current estimated nutrition needs, goal rate for TF will be: Vital 1.2 @ 55 mL/hr which will provide 1584 kcal (102% estimated kcal need), 99 grams of protein, and 1070 mL free water.  - When TF able to be advanced above trickle rate, will advance slowly to continue to monitor tolerance and because pt has been several days without goal rate of TF.  - Weight +7.7 kg since admission; continue to use admission weight of 61 kg to estimate nutrition needs.  Patient is currently intubated on ventilator support MV: 9.2 L/min Temp (24hrs), Avg:99.9  F (37.7 C), Min:97.7 F (36.5 C), Max:101.7 F (38.7 C) Propofol: none BP: 124/46 and MAP: 72   12/4 - Pt remains intubated with OGT in place.  - Note from 1630 yesterday states episode of vomiting and TF was placed on hold.  - Gastric residual checked at ~1645 and TF was restarted at that time: Jevity 1.2 @ 25 mL/hr. - TF was placed on hold this AM for bronchoscopy today, per RN report.  - RN reports prior to TF hold, pt was receiving Jevity 1.2 @ 35 mL/hr (1008 kcal, 47 grams of protein, and 678 mL free water).  - Estimated kcal need updated this AM and continues to be based on weight from 11/27 (61 kg) as weight +8.1 kg since that time.  - Goal for TF at this time:Jevity  1.2 @ 50 mL/hr with 30 mL Prostat BID which will provide 1640 kcal (104% estimated kcal need), 103 grams of protein, and 968 mL free water.  Patient is currently intubated on ventilator support MV: 9.7L/min Temp (24hrs), Avg:100.9 F (38.3 C), Min:99.5 F (37.5 C), Max:101.8 F (38.8 C) Propofol: none BP: 114/44 and MAP: 67   Diet Order:  Diet NPO time specified  Skin:  Reviewed, no issues  Last BM:  12/5  Height:   Ht Readings from Last 1 Encounters:  04/25/16 5\' 2"  (1.575 m)    Weight:   Wt Readings from Last 1 Encounters:  05/02/16 154 lb 5.2 oz (70 kg)    Ideal Body Weight:  50 kg  BMI:  Body mass index is 28.23 kg/m.  Estimated Nutritional Needs:   Kcal:  [id number]  Protein:  90-100g  Fluid:  1.7L/day  EDUCATION NEEDS:   No education needs identified at this time    Jarome Matin, MS, RD, LDN, CNSC Inpatient Clinical Dietitian Pager # [phone] After hours/weekend pager # [phone]

## 2016-05-02 NOTE — Progress Notes (Signed)
PULMONARY / CRITICAL CARE MEDICINE   Name: Kristina Meyer MRN: [id number] DOB: 01-Apr-1941    ADMISSION DATE:  04/23/2016 CONSULTATION DATE:  04/24/16  REFERRING MD:  Dr Carolin Sicks, [place]: HCAP, neutropenic sepsis  SUBJECTIVE:  Got agitated overnight.  Comfortable now on fentanyl drip.  Tolerating trickle feed. (+) BM.     VITAL SIGNS: BP (!) 130/52 (BP Location: Left Arm)   Pulse (!) 116   Temp (!) 100.8 F (38.2 C) (Core (Comment))   Resp (!) 31   Ht 5\' 2"  (1.575 m)   Wt 70 kg (154 lb 5.2 oz)   SpO2 100%   BMI 28.23 kg/m   HEMODYNAMICS:    VENTILATOR SETTINGS: Vent Mode: PRVC FiO2 (%):  [30 %-40 %] 30 % Set Rate:  [20 bmp] 20 bmp Vt Set:  [300 mL] 300 mL PEEP:  [5 cmH20-8 cmH20] 8 cmH20 Plateau Pressure:  [15 cmH20-21 cmH20] 15 cmH20  INTAKE / OUTPUT: I/O last 3 completed shifts: In: 3004.6 [I.V.:140; Blood:310.8; NG/GT:310.3; IV Piggyback:2243.6] Out: [id number] [Urine:2505; Emesis/NG output:120; Chest Tube:550]  PHYSICAL EXAMINATION: General:  Ill appearing elderly woman, sedated on vent. Follows simple commands.  Neuro:  Sedated on vent, non-focal generalized weakness HEENT:  OP clear, PERRL, orally intubated   Cardiovascular:  Tachycardic, regular Lungs:    + equal chest rise decreased bilateral. Left chest tube w/ serous sang drainage. Crackles bibasilar.  Abdomen:  Benign no OM + bowel sounds Musculoskeletal:  Gr 2 edema. More edema in BUE.  Skin:  No rash or mottling   LABS:  BMET  Recent Labs Lab 05/01/16 0347 05/01/16 1600 05/02/16 0403  NA 141 138 139  K 3.6 3.8 3.5  CL 105 104 102  CO2 28 30 32  BUN 24* 25* 21*  CREATININE 0.47 0.42* 0.50  GLUCOSE 121* 158* 137*    Electrolytes  Recent Labs Lab 04/30/16 0948  05/01/16 0347 05/01/16 1600 05/02/16 0403  CALCIUM  --   < > 7.5* 7.3* 6.9*  MG 1.8  --  1.9  --  1.6*  PHOS 1.9*  --  2.1* 3.0 2.7  < > = values in this interval not displayed.  CBC  Recent Labs Lab  04/30/16 0425 05/01/16 0347 05/02/16 0403  WBC 0.4* 0.5* 0.7*  HGB 8.1* 7.5* 7.9*  HCT 23.7* 21.5* 23.5*  PLT 119* 165 230    Coag's No results for input(s): APTT, INR in the last 168 hours.  Sepsis Markers  Recent Labs Lab 04/26/16 0349  PROCALCITON 5.22    ABG  Recent Labs Lab 04/25/16 1511 04/25/16 1710 04/27/16 0308  PHART 7.419 7.458* 7.405  PCO2ART 34.0 31.3* 36.7  PO2ART 76.5* 84.3 90.6    Liver Enzymes  Recent Labs Lab 04/26/16 0349 04/27/16 0453 04/28/16 0416  AST 17 17 19   ALT 13* 12* 11*  ALKPHOS 69 70 77  BILITOT 1.3* 1.5* 1.4*  ALBUMIN 2.4* 2.0* 2.0*    Cardiac Enzymes No results for input(s): TROPONINI, PROBNP in the last 168 hours.  Glucose  Recent Labs Lab 05/01/16 1215 05/01/16 1609 05/01/16 1950 05/01/16 2328 05/02/16 0341 05/02/16 0731  GLUCAP 132* 136* 128* 93 118* 111*    Imaging Dg Chest Port 1 View  Result Date: 05/02/2016 CLINICAL DATA:  Respiratory failure EXAM: PORTABLE CHEST 1 VIEW COMPARISON:  Two days ago FINDINGS: Endotracheal tube tip between the clavicular heads and carina. Bilateral central line, porta catheter on the right, with tips at the SVC level. An orogastric  tube reaches the stomach. Left thoracic drain present. Patchy bilateral lung opacity, most dense in the right upper and left lower lobes. There is new increased hazy density over the bases that is not entirely explained by artifact external to the lower chest. No generalized Kerley line. No pneumothorax. Normal heart size. IMPRESSION: 1. Stable positioning of tubes and lines. 2. Bilateral pneumonia. Increased density over the lower chest may be progression or superimposed atelectasis. Electronically Signed   By: Monte Fantasia M.D.   On: 05/02/2016 07:28  much improved aeration on CXR   STUDIES:  CT chest 11/28 >> No definite CT evidence for acute pulmonary embolus or aortic Dissection.  Moderate large left pleural effusion. Dense left lower lobe  consolidation with partial consolidation in the lingula and right lower lobe which may represent pneumonia.  Mild mid esophageal thickening\  Echo 12/3 > EF 35%, hypokinesis (diffuse)  CULTURES: Blood 11/27 >>  (-) Urine 11/27 >> insignificant growth Blood 11/28 >> (-) Urine 11/28 >> (-) Viral resp panel 11/28 >> neg Pleural fluid 11/29>>>no orgs  But abundant wbc BAL sent for everything 12/4 >   ANTIBIOTICS: Cefepime 11/27 > 11/28 azithro 11/27 >> 12/2 vanco 11/28 >>  meropenem 11/28 >>  anidulafungin 11/30>>> Acyclovir 12/1 >>    SIGNIFICANT EVENTS: 11/29 thora. Incomplete drainage of left pleural space 11/30 Chest tube placed.  12/4 Bronch  LINES/TUBES: Port-s-cath >>  OETT 11/29>>> Left IJ CVL 11/29>>>   ASSESSMENT / PLAN:  PULMONARY A: Acute hypoxic respiratory failure 2/2 Bilateral PNA/HCAP  in the ICH +/- Pulm edema/Diffuse Hypokinesis. 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  PTB being R/O as well. Possible recent exposure to a neighbor with Latent PTB Complicated left effusion (exudate)-->SP CT guided CT 11/30. Pleural cytology (-) for CA Chest pain P:   Cont full vent support. Not ready for weaning.  As mentioned, husband siad they have a neighbor who was treated for latent PTB 5 mos ago. PTB being R/O. Sent AFB x 2 from bronch on 12/4 and sent sputum AFB on 12/5 per ID nurse recommendation.  Bronch BAL are (-) x 2 so most likely we cen d/c PTB isolation.  All cultures (-) so far.  Monitor CT output  PST daily. Slowly waking up. Anticipate long wean as pt slow to wake up.  See ID section  Cont supportive care  Cont diuresis as tolerated. Gentle as pt is on acyclovir.    CARDIOVASCULAR A:  SIRS/sepsis (neutropenic) Transient hypotension, improved w IVF CHE EF 35%, with acute pulm edema and diffuse hypokinesis/demand ischemia P:  Telemetry Gentle diuresis (getting acyclovir which can potentially worsen  renal fxn) with lasix 20 mg IV BID Plan to get her better from infection and potentially consult cards re: echo findings.   RENAL A:   Mild acute renal insufficiency, improved w volume P:   Follow BMP and UOP replet lytes Gentle diuresis with lasix 20 IV BID  GASTROINTESTINAL A:   SUP Nutrition Vomiting on 12/3 P:   H2B for SUP Cont tubefeeds. Will do trickle feed given episodes of vomiting.  If she vomits again, plan to hold off on TF.   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  Cultures (-) RUL and LLL HCAP Concern for Opportunistic infection given persistent neutropenia and AML and recent chemo R/O PTB as well given recent possible exposure to a neighbor with latent PTB Complicated left pleural effusion  Severe sepsis without shock P:   She's neutropenic but all cultures to date are negative. Will continue meropenem, vanc and eraxis, acyclovir for now.  BAL cultures (-) so far.  Keep on PTB isolation. BAL AFB (-) x 2.  1 sample sent on 12/5 [pending. If (-), plan to d/c PTB isolation Cont CT drainage of left pleural space  Neosynephrine weaned off 12/2. May resume if BP drops.   ENDOCRINE  A:   No hx DM P:   Follow glucose on BMP  NEUROLOGIC A:   Anxiety / depression Slowly waking up P:   RASS goal: -1 to -2 Paxil held off on 12/3  2/2 decreased sensorium Fentanyl drip. PRN versed.     FAMILY  - Updates: Pt is a full DNR.  Husband and son undated at bedside on 12/5. Husband updated on 12/6.   - Inter-disciplinary family meet or Palliative Care meeting due by:  12/8   Critical care time with this pt today : 30 minutes.    Monica Becton, MD 05/02/2016, 10:59  AM [facility] Pager [phone] After 3 pm or if no answer, call [phone]

## 2016-05-02 NOTE — Progress Notes (Signed)
Patient ID: Kristina Meyer, female   DOB: Feb 25, 1941, 75 y.o.   MRN: [id number]    Referring Physician(s): Dr. Brand Males  Supervising Physician: Daryll Brod  Patient Status: [facility] - In-pt  Chief Complaint: Complex left pleural effusion  Subjective: Patient arouses more on the vent today and is starting to wake up some.  Allergies: Other  Medications: Prior to Admission medications   Medication Sig Start Date End Date Taking? Authorizing Provider  ALPRAZolam (XANAX) 0.5 MG tablet TAKE 1 TABLET BY MOUTH 3 TIMES A DAY AS NEEDED Patient taking differently: TAKE 1 TABLET BY MOUTH 3 TIMES A DAY AS NEEDED for anxiety. 03/26/16  Yes Volanda Napoleon, MD  aspirin 81 MG tablet Take 81 mg by mouth daily.    Yes Historical Provider, MD  atorvastatin (LIPITOR) 20 MG tablet Take 10 mg by mouth at bedtime.   Yes Historical Provider, MD  Calcium-Magnesium-Vitamin D (CALCIUM 1200+D3 PO) Take 1 tablet by mouth daily.   Yes Historical Provider, MD  cholecalciferol (VITAMIN D) 1000 UNITS tablet Take 1,000 Units by mouth daily.   Yes Historical Provider, MD  hydrochlorothiazide (HYDRODIURIL) 25 MG tablet Take by mouth. 12/08/16  Yes Historical Provider, MD  lidocaine-prilocaine (EMLA) cream Apply 1 application topically as needed. Place quarter sized amount of cream directly on portacath and cover with saran wrap at least 1 - 1 1/2 hours prior to procedure. 01/11/16  Yes Volanda Napoleon, MD  Multiple Vitamins-Minerals (MULTIVITAMIN ADULT) TABS Take 1 tablet by mouth daily.   Yes Historical Provider, MD  orphenadrine (NORFLEX) 100 MG tablet Take 1 tablet (100 mg total) by mouth at bedtime as needed for muscle spasms. 12/29/15  Yes Volanda Napoleon, MD  PARoxetine (PAXIL) 20 MG tablet Take 20 mg by mouth daily.   Yes Historical Provider, MD  potassium chloride SA (K-DUR,KLOR-CON) 20 MEQ tablet Take 1 pill twice a day for 7 days, then 1 pill a day. 02/13/16  Yes Volanda Napoleon, MD  prochlorperazine (COMPAZINE) 5  MG tablet Take 1 tablet (5 mg total) by mouth every 6 (six) hours as needed for nausea or vomiting. 01/05/16  Yes Volanda Napoleon, MD  sulfamethoxazole-trimethoprim (BACTRIM DS) 800-160 MG per tablet Take 1 tablet by mouth daily.   Yes Historical Provider, MD    Vital Signs: BP (!) 130/52 (BP Location: Left Arm)   Pulse (!) 116   Temp (!) 100.8 F (38.2 C) (Core (Comment))   Resp (!) 31   Ht 5\' 2"  (1.575 m)   Wt 154 lb 5.2 oz (70 kg)   SpO2 100%   BMI 28.23 kg/m   Physical Exam: Chest/lungs: left chest tube in place with increase in serosang output to 550cc yesterday.  Drain site is c/d/i.  Lungs are coarse  Imaging: Dg Chest Port 1 View  Result Date: 05/02/2016 CLINICAL DATA:  Respiratory failure EXAM: PORTABLE CHEST 1 VIEW COMPARISON:  Two days ago FINDINGS: Endotracheal tube tip between the clavicular heads and carina. Bilateral central line, porta catheter on the right, with tips at the SVC level. An orogastric tube reaches the stomach. Left thoracic drain present. Patchy bilateral lung opacity, most dense in the right upper and left lower lobes. There is new increased hazy density over the bases that is not entirely explained by artifact external to the lower chest. No generalized Kerley line. No pneumothorax. Normal heart size. IMPRESSION: 1. Stable positioning of tubes and lines. 2. Bilateral pneumonia. Increased density over the lower chest may be progression  or superimposed atelectasis. Electronically Signed   By: Monte Fantasia M.D.   On: 05/02/2016 07:28   Dg Chest Port 1 View  Result Date: 04/30/2016 CLINICAL DATA:  75 year old hypertensive female with respiratory failure. Subsequent encounter. EXAM: PORTABLE CHEST 1 VIEW COMPARISON:  04/28/2016 FINDINGS: Drain left lower thoracic region projecting over the diaphragm. No pneumothorax. Endotracheal tube tip 3.5 cm above the carina. Right central line tip mid to distal superior vena cava level. Nasogastric tube courses below the  diaphragm. Tip is not included on the present exam. Consolidation peripheral aspect right upper lobe and right lung apex. Left base atelectasis versus infiltrate, possibly with residual small pleural effusion. Mild pulmonary vascular congestion. Top-normal heart size. IMPRESSION: Persistent consolidation right upper lobe and left base. Drain left lower thorax. Electronically Signed   By: Genia Del M.D.   On: 04/30/2016 06:41    Labs:  CBC:  Recent Labs  04/29/16 0447 04/30/16 0425 05/01/16 0347 05/02/16 0403  WBC 0.4* 0.4* 0.5* 0.7*  HGB 8.6* 8.1* 7.5* 7.9*  HCT 24.9* 23.7* 21.5* 23.5*  PLT 79* 119* 165 230    COAGS:  Recent Labs  01/11/16 1225  INR 1.20    BMP:  Recent Labs  04/30/16 1600 05/01/16 0347 05/01/16 1600 05/02/16 0403  NA 141 141 138 139  K 3.6 3.6 3.8 3.5  CL 106 105 104 102  CO2 29 28 30  32  GLUCOSE 131* 121* 158* 137*  BUN 22* 24* 25* 21*  CALCIUM 7.5* 7.5* 7.3* 6.9*  CREATININE 0.45 0.47 0.42* 0.50  GFRNONAA >60 >60 >60 >60  GFRAA >60 >60 >60 >60    LIVER FUNCTION TESTS:  Recent Labs  04/24/16 0523 04/25/16 1834 04/26/16 0349 04/27/16 0453 04/28/16 0416  BILITOT 1.8*  --  1.3* 1.5* 1.4*  AST 23  --  17 17 19   ALT 18  --  13* 12* 11*  ALKPHOS 65  --  69 70 77  PROT 6.0* 5.3* 5.1* 4.9* 4.9*  ALBUMIN 3.1*  --  2.4* 2.0* 2.0*    Assessment and Plan: 1. S/p left chest tube placement on 11/30 -increase in output likely secondary to overall diuresis.  It remains serosang in nature.  Cont on suction until output decreases -will follow along.  Electronically Signed: Henreitta Cea 05/02/2016, 10:37 AM   I spent a total of 15 Minutes at the the patient's bedside AND on the patient's hospital floor or unit, greater than 50% of which was counseling/coordinating care for left complex pleural effusion

## 2016-05-03 LAB — BASIC METABOLIC PANEL
ANION GAP: 4 — AB (ref 5–15)
BUN: 21 mg/dL — ABNORMAL HIGH (ref 6–20)
CALCIUM: 7.3 mg/dL — AB (ref 8.9–10.3)
CHLORIDE: 100 mmol/L — AB (ref 101–111)
CO2: 35 mmol/L — AB (ref 22–32)
Creatinine, Ser: 0.47 mg/dL (ref 0.44–1.00)
GFR calc non Af Amer: >60 mL/min (ref >60)
Glucose, Bld: 137 mg/dL — ABNORMAL HIGH (ref 65–99)
POTASSIUM: 3.3 mmol/L — AB (ref 3.5–5.1)
Sodium: 139 mmol/L (ref 135–145)

## 2016-05-03 LAB — GLUCOSE, CAPILLARY
GLUCOSE-CAPILLARY: 127 mg/dL — AB (ref 65–99)
Glucose-Capillary: 93 mg/dL (ref 65–99)
Glucose-Capillary: 117 mg/dL — ABNORMAL HIGH (ref 65–99)
Glucose-Capillary: 120 mg/dL — ABNORMAL HIGH (ref 65–99)
Glucose-Capillary: 107 mg/dL — ABNORMAL HIGH (ref 65–99)
Glucose-Capillary: 122 mg/dL — ABNORMAL HIGH (ref 65–99)

## 2016-05-03 LAB — CBC WITH DIFFERENTIAL/PLATELET
BASOS ABS: 0 10*3/uL (ref 0.0–0.1)
Basophils Relative: 0 %
EOS PCT: 0 %
Eosinophils Absolute: 0 10*3/uL (ref 0.0–0.7)
HEMATOCRIT: 23.3 % — AB (ref 36.0–46.0)
HEMOGLOBIN: 7.6 g/dL — AB (ref 12.0–15.0)
LYMPHS PCT: 73 %
Lymphs Abs: 0.5 10*3/uL — ABNORMAL LOW (ref 0.7–4.0)
MCH: 30.9 pg (ref 26.0–34.0)
MCHC: 32.6 g/dL (ref 30.0–36.0)
MCV: 94.7 fL (ref 78.0–100.0)
MONOS PCT: 3 %
Monocytes Absolute: 0 10*3/uL — ABNORMAL LOW (ref 0.1–1.0)
Neutro Abs: 0.2 10*3/uL — ABNORMAL LOW (ref 1.7–7.7)
Neutrophils Relative %: 24 %
Platelets: 267 10*3/uL (ref 150–400)
RBC: 2.46 MIL/uL — AB (ref 3.87–5.11)
RDW: 18.3 % — ABNORMAL HIGH (ref 11.5–15.5)
WBC: 0.7 10*3/uL — AB (ref 4.0–10.5)

## 2016-05-03 LAB — PHOSPHORUS: PHOSPHORUS: 3.2 mg/dL (ref 2.5–4.6)

## 2016-05-03 LAB — CULTURE, RESPIRATORY W GRAM STAIN
Culture: NO GROWTH
Culture: NORMAL

## 2016-05-03 LAB — CULTURE, RESPIRATORY

## 2016-05-03 LAB — MAGNESIUM: Magnesium: 2.1 mg/dL (ref 1.7–2.4)

## 2016-05-03 MED ORDER — POTASSIUM CHLORIDE 20 MEQ/15ML (10%) PO SOLN
20.0000 meq | ORAL | Status: AC
  Administered 2016-05-03 (×2): 20 meq
  Filled 2016-05-03 (×2): qty 15

## 2016-05-03 MED ORDER — VITAL AF 1.2 CAL PO LIQD
1000.0000 mL | ORAL | Status: DC
  Administered 2016-05-03: 1000 mL
  Filled 2016-05-03 (×2): qty 1000

## 2016-05-03 MED ORDER — FUROSEMIDE 10 MG/ML IJ SOLN: 20.0000 mg | Freq: Every day | INTRAMUSCULAR | Status: DC

## 2016-05-03 MED ORDER — PRO-STAT SUGAR FREE PO LIQD
30.0000 mL | Freq: Two times a day (BID) | ORAL | Status: DC
  Administered 2016-05-03 – 2016-05-10 (×15): 30 mL
  Filled 2016-05-03 (×15): qty 30

## 2016-05-03 MED ORDER — SODIUM CHLORIDE 0.9 % IV BOLUS (SEPSIS): 500.0000 mL | Freq: Once | INTRAVENOUS | Status: DC

## 2016-05-03 NOTE — Progress Notes (Signed)
[facility] ADULT ICU REPLACEMENT PROTOCOL FOR AM LAB REPLACEMENT ONLY  The patient does apply for the [facility] Adult ICU Electrolyte Replacment Protocol based on the criteria listed below:   1. Is GFR >/= 40 ml/min? Yes.    Patient's GFR today is >60 2. Is urine output >/= 0.5 ml/kg/hr for the last 6 hours? Yes.   Patient's UOP is 1.41 ml/kg/hr 3. Is BUN < 60 mg/dL? Yes.    Patient's BUN today is 21 4. Abnormal electrolyte(s) Potassium 3.3 5. Ordered repletion with: Potassium per protocol 6. If a panic level lab has been reported, has the CCM MD in charge been notified? No..   Physician:    Conley Canal P 05/03/2016 4:41 AM

## 2016-05-03 NOTE — Progress Notes (Signed)
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  Broad abx were started on admission for sepsis and PNA.     Day #11 antibiotics D#10 vancomycin, meropenem D#9 anidulafungin D#6 acyclovir  Today, 05/03/2016: - remains febrile - WBC = 0.7 (ANC = 200).  Oncology has on high-dose Filgrastim - Renal: SCr WNL, good UOP with loop diuretic  Plan:  continue acyclovir 670 mg IV q8h (~10 mg/kg/dose)  continue vancomycin dose 750mg  IV q8h  Recheck trough in am due to q8h dosing in elderly patient.  Rule out accumulation.  72h from previous level that was therapeutic on current dose  continue meropenem 1gm IV q8h  continue Eraxis 100 mg IV daily  f/u cultures, renal funct, clinical status  Consider consult infectious diseases as deemed appropriate  ___________________________  Height: 5\' 2"  (157.5 cm) Weight: 151 lb 14.4 oz (68.9 kg) IBW/kg (Calculated) : 50.1  Temp (24hrs), Avg:100.7 F (38.2 C), Min:99.3 F (37.4 C), Max:101.3 F (38.5 C)   Recent Labs Lab 04/29/16 0447 04/29/16 1821 04/30/16 0425 04/30/16 1600 05/01/16 0323 05/01/16 0347 05/01/16 1600 05/02/16 0403 05/03/16 0325  WBC 0.4*  --  0.4*  --   --  0.5*  --  0.7* 0.7*  CREATININE 0.50 0.48 0.41* 0.45  --  0.47 0.42* 0.50 0.47  VANCOTROUGH  --  10*  --   --  17  --   --   --   --     Estimated Creatinine Clearance: 53.6 mL/min (by C-G formula based on SCr of 0.47 mg/dL).    Allergies  Allergen Reactions  . Other Other (See Comments)    GENERAL Anesthesia, vomiting    Antimicrobials this admission:  11/27  cefepime >> 11/28 11/28 Vancomycin >> 11/28 Meropenem >> 11/27 azithromycin >> 12/01 11/29 Eraxis (empiric)>> 12/2 acyclovir (empiric)>>  Dose adjustments this admission:  12/2 VT at 0915 =  7 (500 mg q12h) --> increased to 500 mg q8h 12/3 VT at 1821= 10 (500mg  q8h) >>increased to 750mg  q8h 12/5  VT @ 0330 = 17 (750mg  q8h)  Microbiology results:  11/27 BCx x2: neg FINAL 11/27 UCx: insignificant growth 11/27: Legionella antigen: neg 11/27: Strep antigen: neg 11/28 MRSA PCR (-) 11/28 bcx x2: NGF 11/29 Pleural fluid: NGF 11/29 TA: neg FINAL -------------------------------------------- 12/4 AFB from BAL: smear neg, cx pending 12/4 Fungus cx from BAL: sent 12/5 AFB (trach asp): smear = neg, culture pending 12/4 BAL: ngtd  Thank you for allowing pharmacy to be a part of this patient's care.  Doreene Eland, PharmD, BCPS.   Pager: [phone] 05/03/2016 7:46 AM

## 2016-05-03 NOTE — Progress Notes (Signed)
eLink Physician-Brief Progress Note Patient Name: Meisha Guster DOB: [birth year] MRN: [id number]   Date of Service  05/03/2016  HPI/Events of Note  Hypotension - BP = 98/52.   eICU Interventions  Will order: 1. Bolus with 0.9 NaCl 500 mL IV over 30 minutes now.  2. Monitor CVP. 3. Hold 10 AM Lasix dose until re-evaluated by rounding team.      Intervention Category Intermediate Interventions: Hypotension - evaluation and management  Musette Kisamore Eugene 05/03/2016, 6:07 AM

## 2016-05-03 NOTE — Progress Notes (Signed)
PULMONARY / CRITICAL CARE MEDICINE   Name: Kristina Meyer MRN: [id number] DOB: [birth year]    ADMISSION DATE:  04/23/2016 CONSULTATION DATE:  04/24/16  REFERRING MD:  Dr Carolin Sicks, [place]: HCAP, neutropenic fever / sepsis  SUBJECTIVE:   RN reports pt more alert, following commands, weaning on PSV.  AFB negative x3 / off isolation.  Temp curve improved, WBC stable.  CT with ~ 200 ml out in last 24 hours.  Received 500 ml fluid bolus last night for soft bp.    VITAL SIGNS: BP (!) 121/47   Pulse 91   Temp 99.3 F (37.4 C) (Core (Comment)) Comment (Src): bladder  Resp 12   Ht 5\' 2"  (1.575 m)   Wt 151 lb 14.4 oz (68.9 kg)   SpO2 100%   BMI 27.78 kg/m   HEMODYNAMICS: CVP:  [10 mmHg-13 mmHg] 13 mmHg  VENTILATOR SETTINGS: Vent Mode: PSV;CPAP FiO2 (%):  [30 %] 30 % Set Rate:  [20 bmp] 20 bmp Vt Set:  [300 mL] 300 mL PEEP:  [8 cmH20] 8 cmH20 Pressure Support:  [5 cmH20] 5 cmH20 Plateau Pressure:  [15 cmH20-17 cmH20] 17 cmH20  INTAKE / OUTPUT: I/O last 3 completed shifts: In: 3176.4 [I.V.:311.2; Other:255; NG/GT:590; IV Piggyback:2020.2] Out: [id number] [Urine:3835; Chest Tube:716]  PHYSICAL EXAMINATION: General:  Ill appearing elderly woman, sedated on vent.  Neuro:  Sedated on vent, generalized weakness but follows simple commands.  HEENT:  OP clear, PERRL, orally intubated   Cardiovascular:  s1s2, regular Lungs: even/non-labored, lungs bilaterally coarse with rhonchi, left chest tube w/ clear yellow drainage.   Abdomen:  Soft, non-tender, bsx4 positive Musculoskeletal:  Gr 2 edema. More edema in BUE.  Skin:  No rash or mottling.  RUE with area of erythema/warmth  LABS:  BMET  Recent Labs Lab 05/01/16 1600 05/02/16 0403 05/02/16 1455 05/03/16 0325  NA 138 139  --  139  K 3.8 3.5 3.8 3.3*  CL 104 102  --  100*  CO2 30 32  --  35*  BUN 25* 21*  --  21*  CREATININE 0.42* 0.50  --  0.47  GLUCOSE 158* 137*  --  137*    Electrolytes  Recent Labs Lab  05/01/16 0347 05/01/16 1600 05/02/16 0403 05/03/16 0325  CALCIUM 7.5* 7.3* 6.9* 7.3*  MG 1.9  --  1.6* 2.1  PHOS 2.1* 3.0 2.7 3.2    CBC  Recent Labs Lab 05/01/16 0347 05/02/16 0403 05/03/16 0325  WBC 0.5* 0.7* 0.7*  HGB 7.5* 7.9* 7.6*  HCT 21.5* 23.5* 23.3*  PLT 165 230 267    Coag's No results for input(s): APTT, INR in the last 168 hours.  Sepsis Markers No results for input(s): LATICACIDVEN, PROCALCITON, O2SATVEN in the last 168 hours.  ABG  Recent Labs Lab 04/27/16 0308  PHART 7.405  PCO2ART 36.7  PO2ART 90.6    Liver Enzymes  Recent Labs Lab 04/27/16 0453 04/28/16 0416  AST 17 19  ALT 12* 11*  ALKPHOS 70 77  BILITOT 1.5* 1.4*  ALBUMIN 2.0* 2.0*    Cardiac Enzymes No results for input(s): TROPONINI, PROBNP in the last 168 hours.  Glucose  Recent Labs Lab 05/02/16 1116 05/02/16 1653 05/02/16 2136 05/02/16 2337 05/03/16 0348 05/03/16 0750  GLUCAP 120* 118* 118* 115* 127* 93    Imaging No results found.much improved aeration on CXR   STUDIES:  CT chest 11/28 >> No definite CT evidence for acute pulmonary embolus or aortic Dissection.  Moderate large left  pleural effusion. Dense left lower lobe consolidation with partial consolidation in the lingula and right lower lobe which may represent pneumonia.  Mild mid esophageal thickening\  Echo 12/3 > EF 35%, hypokinesis (diffuse)  CULTURES: Blood 11/27 >>  (-) Urine 11/27 >> insignificant growth Blood 11/28 >> (-) Urine 11/28 >> (-) Viral resp panel 11/28 >> neg Pleural fluid 11/29 >> no orgs, abundant wbc BAL 12/4  AFB >> neg  RVP >>   Fungal >>   Culture >> few wbc >>      ANTIBIOTICS: Cefepime 11/27 > 11/28 azithro 11/27 >> 12/2 vanco 11/28 >>  meropenem 11/28 >>  anidulafungin 11/30 >> Acyclovir 12/1 >>    SIGNIFICANT EVENTS: 11/29  thora. Incomplete drainage of left pleural space 11/30  Chest tube placed.  12/04  Bronch 12/07  PSV weaning    LINES/TUBES: Port-s-cath >>  OETT 11/29 >> Left IJ CVL 11/29 >>   ASSESSMENT / PLAN:  PULMONARY A: Acute hypoxic respiratory failure 2/2 Bilateral PNA/HCAP in the ICH +/- Pulm edema/Diffuse Hypokinesis. Concern for Opportunistic Infection given neutropenia, AML and recent chemotherapy.  PTB - ruled out, possible recent exposure to a neighbor with latent PTB Complicated left effusion (exudate)-->SP CT guided CT 11/30. Pleural cytology (-) for CA Chest pain P:   Cont full vent support.  PSV weaning as tolerated   Monitor CT output   See ID section  Cont supportive care  Continue gentle diuresis, 20 mg QD > pt is on acyclovir   CARDIOVASCULAR A:  SIRS/sepsis - neutropenic Transient hypotension, improved w IVF CHE EF 35%, with acute pulm edema and diffuse hypokinesis/demand ischemia P:  Telemetry Gentle diuresis (getting acyclovir which can potentially worsen renal fxn) with lasix 20 mg IV QD Plan to get her better from infection and potentially consult cards re: echo findings.   RENAL A:   Mild acute renal insufficiency, improved w volume P:   Follow BMP and UOP Replace electrolytes as indicated  Reduce lasix to 20 mg QD with soft pressure overnight 12/7  GASTROINTESTINAL A:   SUP Nutrition Vomiting - on 12/3  P:   H2B for SUP Cont tubefeeds. Will do trickle feed given episodes of vomiting.  If she vomits again, plan to hold off on TF.  Consider rate increase in am 12/8  HEMATOLOGIC / ONCOLOGIC A:   AML receiving treatment, last 1 week PTA Febrile Neutropenia - persistent Thrombocytopenia - improved Anemia - s/p PRBC 11/27, 12/5 P:  Monitor for bleeding  Follow CBC + diff Cont neupogen, dose increased on 12/4 Consider restart pharmacologic DVT prophylaxis   INFECTIOUS A:   Febrile neutropenia - persistently neutropenic despite broad spectrum abx. Cultures (-) RUL and LLL HCAP Rule out opportunistic infection - given persistent neutropenia and AML  and recent chemo PTB Ruled Out - recent possible exposure to a neighbor with latent PTB Complicated left pleural effusion  Severe sepsis - resolved shock, neo weaned off 12/2 ? RUE Cellulitis - R lower forearm P:   She's neutropenic but all cultures to date are negative. Will continue meropenem, vanc and eraxis, acyclovir for now.  Continue abx until afebrile and ANC > 500 Cont CT drainage of left pleural space  Warm compress to RUE  ENDOCRINE A:   No hx DM P:   Follow glucose on BMP  NEUROLOGIC A:   Anxiety / depression Acute Encephalopathy - mental status slowly improving P:   RASS goal: 0 to -1 Paxil held off on 12/3 with decreased  sensorium Fentanyl drip.  PRN versed.     FAMILY  - Updates: Pt is a full DNR.  Son undated at bedside on 12/7.  - Inter-disciplinary family meet or Palliative Care meeting due by:  12/8   CC Time:  30 minutes.   Noe Gens, NP-C [facility] Pgr: [phone] or if no answer [phone] 05/03/2016, 8:47 AM  ATTENDING NOTE / ATTESTATION NOTE :   I have discussed the case with the resident/APP  Noe Gens NP  I agree with the resident/APP's  history, physical examination, assessment, and plans.    I have edited the above note and modified it according to our agreed history, physical examination, assessment and plan.   Briefly, patient with AML, recent chemotherapy, presenting with acute hypoxemic respiratory failure secondary to pneumonia in immunocompromised host. She remains to have febrile neutropenia despite getting Neupogen, broad-spectrum antibiotics. Slowly improving. Last 24 hours, blood pressure dropped related to diuresis. Patient seen, examined. Vital signs stable. Fever trend is better. Arousable.. Sedated. Good air entry both lung fields. Crackles at the bases. Tachycardic. Soft abdomen. Benign. Edema in both upper extremities. Labs reviewed. WBC still 0.7. ANC is very low. Cultures all negative so far. AFB  negative 2 for broch/BAL  and negative times one per trach aspirate.  Assessment: Febrile neutropenia. Severe.  Acute hypoxemic respiratory failure secondary to pneumonia in immunocompromised host, pulmonary edema.  AML  Plan : As above Continue ventilatory support. Not ready for weaning. Continue broad-spectrum antimicrobial with vancomycin, meropenem, acyclovir, Eraxis. We'll consider de-escalating in the next couple days. Decrease Lasix to daily from twice a day. If blood pressure drops, suggest starting Neo-Synephrine peripherally. Continue when necessary sedation. Continue trickle feeds. We'll try to increase feeds from 15-25 ML's an hour. If she has recurrence of vomiting, we'll decrease rate again.   I have spent 30  minutes of critical care time with this patient today.  Family :Family updated at length today.  Son updated at bedside.  Monica Becton, MD 05/03/2016, 11:19 AM [place] Pager [phone] After 3 pm or if no answer, call [phone]

## 2016-05-03 NOTE — Progress Notes (Signed)
Nutrition Follow-up  DOCUMENTATION CODES:   Not applicable  INTERVENTION:  - Will increase to Vital 1.2 @ 25 mL/hr which will provide 720 kcal (50% estimated kcal need), 45 grams of protein (50% minimum estimated protein need), and 487 mL free water. - Will order 30 mL Prostat BID which will provide an additional 200 kcal and 30 grams of protein (pt will then meet 83% minimum estimated protein need).  - RD will follow-up 12/8.  NUTRITION DIAGNOSIS:   Inadequate oral intake related to inability to eat as evidenced by NPO status. -ongoing  GOAL:   Patient will meet greater than or equal to 90% of their needs -unmet with current TF rate.   MONITOR:   TF tolerance, Vent status, Weight trends, Labs, I & O's  ASSESSMENT:   75 y.o. 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 The patient was in her usual state of health, got her Cytovene infusions last last Tuesday, and then over the last 3 or 4 days has developed progressive weakness and "just feeling awful". At first she thought she was just getting anemic again because of how tired she was over the weekend, but then tonight at 2AM she woke with fever to 102F, vomited, had worse malaise and chills and came to the ER.   She has had no dysuria or other urinary irritative symptoms.  She has had a new nonproductive cough this week.  No nose bleeds, bleeding from gums.  12/7 Pt with OGT and continues with Vital 1.2 @ 15 mL/hr. Talked with Dr. Corrie Dandy and plan to increase to Vital 1.2 @ 25 mL/hr at this time. Communicated plan with RN. Estimated kcal need updated this AM based on current Tmax and Ve. Based on this, updated goal for TF is: Vital 1.2 @ 50 mL/hr which will provide 1440 kcal, 90 grams of protein, and 973 mL free water.   Weight -1.1 kg from yesterday but is +8.9 kg from admission. Will continue to monitor weight trends closely. Pt's last BM was 12/5 and RN confirms no  emesis yesterday, overnight, or this AM.   Patient is currently intubated on ventilator support MV: 6.9 L/min Temp (24hrs), Avg:100.4 F (38 C), Min:98.8 F (37.1 C), Max:101.3 F (38.5 C) BP: 123/43 and MAP: 68.  Medications reviewed; 20 mg IV Pepcid BID, 20 mg IV Lasix/day starting tomorrow, sliding scale Novolog, 2 g IV Mg sulfate x1 dose yesterday, PRN IV Zofran, 20 mEq KCl per OGT x2 doses today, 40 mEq KCl per OGT x1 dose yesterday. Labs reviewed; CBGs: 93 and 127 mg/dL this AM, K: 3.3 mmol/L, Cl: 100 mmol/L, BUN: 21 mg/dL, Ca: 7.3 mg/dL.  Drip: Fentanyl @ 25 mcg/hr.     12/6 - Pt continues with OGT and is receiving Vital 1.2 @ 15 mL/hr which is providing 432 kcal, 27 grams of protein, and 292 mL free water.  - Spoke with Dr. Corrie Dandy earlier this AM and plan is to continue at this rate until tomorrow AM. - RN reports pt had a BM yesterday and has had no further vomiting, to her knowledge, since re-start of TF yesterday afternoon.  - Dr. Corrie Dandy' note from this AM states pt is not yet ready to trial vent weaning.  - Pt is +9 kg since admission; will continue to monitor weight trends.  Patient is currently intubated on ventilator support MV: 11 L/min Temp (24hrs), Avg:100.3 F (37.9 C), Min:99.1  F (37.3 C), Max:101.1 F (38.4 C) BP: 144/61 and MAP: 88.   12/5 - Bronchoscopy performed yesterday with associated note at 1403.  - Per Dr. Antonieta Pert note this AM, possible repeat bone marrow testing later this week.  - Dr. Corrie Dandy' note from this AM states plan to provide trickle rate TF with ongoing vomiting.  - Pt has been having BMs and last BM was yesterday.  - Spoke with Dr. Corrie Dandy and plan is for TF at 10-20 mL and to hold at this rate for today (will order rate of 15 mL/hr given goal rate). - Based on current estimated nutrition needs, goal rate for TF will [id number] 1.2 @ 55 mL/hr which will provide 1584 kcal (102% estimated kcal need), 99 grams of protein, and 1070 mL  free water.  - When TF able to be advanced above trickle rate, will advance slowly to continue to monitor tolerance and because pt has been several days without goal rate of TF.  - Weight +7.7 kg since admission; continue to use admission weight of 61 kg to estimate nutrition needs.  Patient is currently intubated on ventilator support MV: 9.2L/min Temp (24hrs), Avg:99.9 F (37.7 C), Min:97.7 F (36.5 C), Max:101.7 F (38.7 C) Propofol: none BP: 124/46 and MAP: 72    Diet Order:  Diet NPO time specified  Skin:  Reviewed, no issues  Last BM:  12/5  Height:   Ht Readings from Last 1 Encounters:  04/25/16 5\' 2"  (1.575 m)    Weight:   Wt Readings from Last 1 Encounters:  05/03/16 151 lb 14.4 oz (68.9 kg)    Ideal Body Weight:  50 kg  BMI:  Body mass index is 27.78 kg/m.  Estimated Nutritional Needs:   Kcal:  [id number]  Protein:  90-100g  Fluid:  1.7L/day  EDUCATION NEEDS:   No education needs identified at this time    Jarome Matin, MS, RD, LDN, CNSC Inpatient Clinical Dietitian Pager # [phone] After hours/weekend pager # [phone]

## 2016-05-03 NOTE — Progress Notes (Signed)
Surprisingly, her white cell count did not go any further. It is still 0.7. Her platelet count is doing quite well. Her hemoglobin is 7.6.  She still is on the ventilator. Her metabolic panel looks okay. She continues on her tube feeds.  She is still on broad-spectrum antibiotic coverage. She has the cellulitis in the right forearm which looks about the same. I still don't think she has any positive cultures.  She continues on the Neupogen. I have to believe that her white cell count will continue to improve.  Her temperature spikes still are not that high. I the MAXIMUM TEMPERATURE yesterday was 101.3. It is currently 100.6. To me, I think this will tell us how her immune system is doing.  I still think that a bone marrow test is probably going to be indicated. 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 I really would like to wait until we seem better white cell recovery.  She continues to try hard. I know that she is sedated.  The nurses are doing a fantastic job. They really are helping out and making the situation as tolerable as possible for her family. I know her family is very thankful and grateful for the extra time that the nurses take.   There really is no change on her physical exam. The cellulitis in the right forearm may look alone bit more prominent. She may have some more swelling in the extremities.   We will continue to follow along and try to help provide some support.   Lattie Haw, MD  Psalm 46:1

## 2016-05-04 ENCOUNTER — Inpatient Hospital Stay (HOSPITAL_COMMUNITY): Payer: Medicare Other

## 2016-05-04 DIAGNOSIS — G9340 Encephalopathy, unspecified: Secondary | ICD-10-CM

## 2016-05-04 DIAGNOSIS — L03113 Cellulitis of right upper limb: Secondary | ICD-10-CM

## 2016-05-04 DIAGNOSIS — J9601 Acute respiratory failure with hypoxia: Secondary | ICD-10-CM

## 2016-05-04 LAB — BASIC METABOLIC PANEL
ANION GAP: 5 (ref 5–15)
Anion gap: 4 — ABNORMAL LOW (ref 5–15)
BUN: 24 mg/dL — AB (ref 6–20)
BUN: 22 mg/dL — ABNORMAL HIGH (ref 6–20)
CALCIUM: 7.6 mg/dL — AB (ref 8.9–10.3)
CALCIUM: 7.7 mg/dL — AB (ref 8.9–10.3)
CO2: 33 mmol/L — AB (ref 22–32)
CO2: 33 mmol/L — AB (ref 22–32)
CREATININE: 0.37 mg/dL — AB (ref 0.44–1.00)
Chloride: 103 mmol/L (ref 101–111)
Chloride: 100 mmol/L — ABNORMAL LOW (ref 101–111)
Creatinine, Ser: 0.31 mg/dL — ABNORMAL LOW (ref 0.44–1.00)
GFR calc non Af Amer: >60 mL/min (ref >60)
GLUCOSE: 128 mg/dL — AB (ref 65–99)
Glucose, Bld: 135 mg/dL — ABNORMAL HIGH (ref 65–99)
Potassium: 3.7 mmol/L (ref 3.5–5.1)
Potassium: 3.5 mmol/L (ref 3.5–5.1)
Sodium: 140 mmol/L (ref 135–145)
Sodium: 138 mmol/L (ref 135–145)

## 2016-05-04 LAB — MAGNESIUM: MAGNESIUM: 2.1 mg/dL (ref 1.7–2.4)

## 2016-05-04 LAB — GLUCOSE, CAPILLARY
GLUCOSE-CAPILLARY: 121 mg/dL — AB (ref 65–99)
GLUCOSE-CAPILLARY: 98 mg/dL (ref 65–99)
GLUCOSE-CAPILLARY: 132 mg/dL — AB (ref 65–99)
Glucose-Capillary: 123 mg/dL — ABNORMAL HIGH (ref 65–99)
Glucose-Capillary: 123 mg/dL — ABNORMAL HIGH (ref 65–99)
Glucose-Capillary: 129 mg/dL — ABNORMAL HIGH (ref 65–99)

## 2016-05-04 LAB — CBC
HCT: 22.3 % — ABNORMAL LOW (ref 36.0–46.0)
Hemoglobin: 7.2 g/dL — ABNORMAL LOW (ref 12.0–15.0)
MCH: 31.9 pg (ref 26.0–34.0)
MCHC: 32.3 g/dL (ref 30.0–36.0)
MCV: 98.7 fL (ref 78.0–100.0)
PLATELETS: 256 10*3/uL (ref 150–400)
RBC: 2.26 MIL/uL — ABNORMAL LOW (ref 3.87–5.11)
RDW: 18.4 % — AB (ref 11.5–15.5)
WBC: 0.9 10*3/uL — CL (ref 4.0–10.5)

## 2016-05-04 LAB — VANCOMYCIN, TROUGH: VANCOMYCIN TR: 19 ug/mL (ref 15–20)

## 2016-05-04 LAB — PHOSPHORUS: Phosphorus: 3.3 mg/dL (ref 2.5–4.6)

## 2016-05-04 MED ORDER — FUROSEMIDE 10 MG/ML IJ SOLN
20.0000 mg | Freq: Once | INTRAMUSCULAR | Status: AC
  Administered 2016-05-04: 20 mg via INTRAVENOUS
  Filled 2016-05-04: qty 2

## 2016-05-04 MED ORDER — VITAL AF 1.2 CAL PO LIQD
1000.0000 mL | ORAL | Status: DC
  Administered 2016-05-04 – 2016-05-07 (×4): 1000 mL
  Filled 2016-05-04 (×5): qty 1000

## 2016-05-04 MED ORDER — FUROSEMIDE 10 MG/ML IJ SOLN: 20.0000 mg | Freq: Every day | INTRAMUSCULAR | Status: DC

## 2016-05-04 NOTE — Consult Note (Signed)
WOC consulted for cellulitis right arm Discussed with bedside nurse Erythema noted right arm for several days by the nurse, this has been marked.  No topical care recommended for cellulitis at this time.  Should this patient develop open weeping wounds would recommend xeroform gauze for the antibacteria/drying effects. Will add PRN order for this should staff notice a change.  Discussed with bedside nurse if this could be a IV infiltrate site, she does not think so. She reports that patient has a port that was accessed and now has a central line.  If IV infiltration suspected would suggest consultation with IV team nurse for recommendation for S/S to monitor for related to which IV fluid or medication may have been an issue.   Re consult if needed, will not follow at this time. Thanks  Onyx Edgley [employer], RN,CWOCN, CNS [phone])

## 2016-05-04 NOTE — Progress Notes (Signed)
PULMONARY / CRITICAL CARE MEDICINE   Name: Kristina Meyer MRN: [id number] DOB: 09-24-1940    ADMISSION DATE:  04/23/2016 CONSULTATION DATE:  04/24/16  REFERRING MD:  Dr Carolin Sicks, [place]: HCAP, neutropenic sepsis  SUBJECTIVE:  Required IVF for low bp. Lasix held    VITAL SIGNS: BP (!) 141/42   Pulse 90   Temp 99 F (37.2 C)   Resp 12   Ht 5\' 2"  (1.575 m)   Wt 156 lb 1.4 oz (70.8 kg)   SpO2 98%   BMI 28.55 kg/m   HEMODYNAMICS: CVP:  [9 mmHg-12 mmHg] 10 mmHg  VENTILATOR SETTINGS: Vent Mode: PSV;CPAP FiO2 (%):  [30 %] 30 % Set Rate:  [16 bmp] 16 bmp Vt Set:  [300 mL] 300 mL PEEP:  [8 cmH20] 8 cmH20 Pressure Support:  [5 cmH20] 5 cmH20 Plateau Pressure:  [13 cmH20-15 cmH20] 15 cmH20  INTAKE / OUTPUT: I/O last 3 completed shifts: In: 3362.9 [I.V.:534.8; Other:100; NG/GT:644.5; IV Piggyback:2083.6] Out: 2751 [Urine:2105; Emesis/NG output:320; Chest Tube:326]  PHYSICAL EXAMINATION: General:  Ill appearing elderly woman, sedated on vent. Follows simple commands.  Neuro:  Sedated on vent, non-focal generalized weakness HEENT:  OP clear, PERRL, orally intubated   Cardiovascular:  Tachycardic, regular Lungs:    + equal chest rise decreased bilateral. Left chest tube w/ serous sang drainage 190 cc in 24 hrs. Crackles bibasilar.  Abdomen:  Benign no OM + bowel sounds Musculoskeletal:  Gr 2 edema. More edema in BUE.  Skin:  No rash or mottling   LABS:  BMET  Recent Labs Lab 05/02/16 0403 05/02/16 1455 05/03/16 0325 05/04/16 0308  NA 139  --  139 140  K 3.5 3.8 3.3* 3.7  CL 102  --  100* 103  CO2 32  --  35* 33*  BUN 21*  --  21* 24*  CREATININE 0.50  --  0.47 0.37*  GLUCOSE 137*  --  137* 128*    Electrolytes  Recent Labs Lab 05/02/16 0403 05/03/16 0325 05/04/16 0308  CALCIUM 6.9* 7.3* 7.6*  MG 1.6* 2.1 2.1  PHOS 2.7 3.2 3.3    CBC  Recent Labs Lab 05/02/16 0403 05/03/16 0325 05/04/16 0308  WBC 0.7* 0.7* 0.9*  HGB 7.9* 7.6*  7.2*  HCT 23.5* 23.3* 22.3*  PLT 230 267 256    Coag's No results for input(s): APTT, INR in the last 168 hours.  Sepsis Markers No results for input(s): LATICACIDVEN, PROCALCITON, O2SATVEN in the last 168 hours.  ABG No results for input(s): PHART, PCO2ART, PO2ART in the last 168 hours.  Liver Enzymes  Recent Labs Lab 04/28/16 0416  AST 19  ALT 11*  ALKPHOS 77  BILITOT 1.4*  ALBUMIN 2.0*    Cardiac Enzymes No results for input(s): TROPONINI, PROBNP in the last 168 hours.  Glucose  Recent Labs Lab 05/03/16 1134 05/03/16 1538 05/03/16 1942 05/03/16 2319 05/04/16 0311 05/04/16 0731  GLUCAP 117* 120* 107* 122* 123* 121*    Imaging No results found.much improved aeration on CXR   STUDIES:  CT chest 11/28 >> No definite CT evidence for acute pulmonary embolus or aortic Dissection.  Moderate large left pleural effusion. Dense left lower lobe consolidation with partial consolidation in the lingula and right lower lobe which may represent pneumonia.  Mild mid esophageal thickening\  Echo 12/3 > EF 35%, hypokinesis (diffuse)  CULTURES: Blood 11/27 >>  (-) Urine 11/27 >> insignificant growth Blood 11/28 >> (-) Urine 11/28 >> (-) Viral resp panel  11/28 >> neg Pleural fluid 11/29>>>no orgs  But abundant wbc BAL sent for everything 12/4 >   ANTIBIOTICS: Cefepime 11/27 > 11/28 azithro 11/27 >> 12/2 vanco 11/28 >>  meropenem 11/28 >>  anidulafungin 11/30>>> Acyclovir 12/1 >>    SIGNIFICANT EVENTS: 11/29 thora. Incomplete drainage of left pleural space 11/30 Chest tube placed.  12/4 Bronch  LINES/TUBES: Port-s-cath >>  OETT 11/29>>> Left IJ CVL 11/29>>>   ASSESSMENT / PLAN:  PULMONARY A: Acute hypoxic respiratory failure 2/2 Bilateral PNA/HCAP  in the ICH +/- Pulm edema/Diffuse Hypokinesis. 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  PTB being R/O as well. Possible recent exposure to a neighbor with  Latent PTB Complicated left effusion (exudate)-->SP CT guided CT 11/30. Pleural cytology (-) for CA Chest pain P:   Cont full vent support. Not ready for weaning.  As mentioned, husband siad they have a neighbor who was treated for latent PTB 5 mos ago. PTB being R/O. Sent AFB x 2 from bronch on 12/4 and sent sputum AFB on 12/5 per ID nurse recommendation.  Bronch BAL are (-) x 2 so most likely we cen d/c PTB isolation.  All cultures (-) so far.  Monitor CT output  PST daily. Slowly waking up. Anticipate long wean as pt slow to wake up.  See ID section  Cont supportive care  Cont diuresis as tolerated. Gentle as pt is on acyclovir.  ABG on 12/8 was reassuring. Cont present settings.   CARDIOVASCULAR A:  SIRS/sepsis (neutropenic) Transient hypotension, improved w IVF CHE EF 35%, with acute pulm edema and diffuse hypokinesis/demand ischemia P:  Telemetry Gentle diuresis (getting acyclovir which can potentially worsen renal fxn) with lasix 20 IV daily (BP dropped with BID dose) Plan to get her better from infection and potentially consult cards re: echo findings.   RENAL Lab Results  Component Value Date   CREATININE 0.37 (L) 05/04/2016   CREATININE 0.47 05/03/2016   CREATININE 0.50 05/02/2016   CREATININE 0.8 04/16/2016   CREATININE 0.8 04/09/2016   CREATININE 0.7 04/04/2016   CREATININE 0.8 02/08/2016   CREATININE 1.4 (H) 12/21/2015    Recent Labs Lab 05/02/16 1455 05/03/16 0325 05/04/16 0308  K 3.8 3.3* 3.7     A:   Mild acute renal insufficiency, improved w volume P:   Follow BMP and UOP replet lytes Gentle diuresis with lasix 20 daily  GASTROINTESTINAL A:   SUP Nutrition Vomiting on 12/3 P:   H2B for SUP Cont tubefeeds. Will do trickle feed given episodes of vomiting.  If she vomits again, plan to hold off on TF.  Slowly increasing TF by 10 mls daily (now at 35 mls/hr)  HEMATOLOGIC / ONCOLOGIC  Recent Labs  05/03/16 0325 05/04/16 0308  HGB 7.6*  7.2*    A:   AML receiving treatment, last 1 week PTA Persistent Febrile Neutropenia Thrombocytopenia, resolved Anemia s/p PRBC 11/27, 12/5 P:  No overt bleeding Follow CBC + diff Cont neupogen, dose increased on 12/4    INFECTIOUS A:   Febrile neutropenia, persistently neutropenic despite broad spectrum abx. Cultures (-) RUL and LLL HCAP Concern for Opportunistic infection given persistent neutropenia and AML and recent chemo R/O PTB as well given recent possible exposure to a neighbor with latent PTB Complicated left pleural effusion  Severe sepsis without shock P:   She's neutropenic but all cultures to date are negative. Will continue meropenem, vanc and eraxis, acyclovir for now.  BAL cultures (-) so far.  Cont CT drainage of left pleural space  Neosynephrine weaned off 12/2. May resume if BP drops  ENDOCRINE CBG (last 3)   Recent Labs  05/03/16 2319 05/04/16 0311 05/04/16 0731  GLUCAP 122* 123* 121*     A:   No hx DM P:   Follow glucose on BMP  NEUROLOGIC A:   Anxiety / depression Slowly waking up P:   RASS goal: -1 to -2 Paxil held off on 12/3  2/2 decreased sensorium Fentanyl drip. PRN versed.     FAMILY  - Updates: Pt is a full DNR.  Husband and son undated at bedside on 12/5. Husband updated on 12/6.   - Inter-disciplinary family meet or Palliative Care meeting due by:  12/8   Critical care time with this pt today : 30 minutes.    Richardson Landry Minor ACNP Maryanna Shape PCCM Pager [phone] till 3 pm If no answer page [phone] 05/04/2016, 8:16 AM   ATTENDING NOTE / ATTESTATION NOTE :   I have discussed the case with the resident/APP  Richardson Landry Minor NP  I agree with the resident/APP's  history, physical examination, assessment, and plans.    I have edited the above note and modified it according to our agreed history, physical examination, assessment and plan.   Briefly, patient with AML, recent chemotherapy, presenting with acute hypoxemic  respiratory failure secondary to pneumonia in immunocompromised host. She remains to have febrile neutropenia despite getting Neupogen, broad-spectrum antibiotics. Slowly improving. First time doing PST and she was tachypneic, tachycardic but lasted at least an hour.   Patient seen, examined. Vital signs stable. Fever trend is better. Arousable.. Sedated. Good air entry both lung fields. Crackles at the bases. Tachycardic. Soft abdomen. Benign. Edema in both upper extremities. She has erythema over the R dorsal forearm, new last 24 hrs. (-) purulent d/c.   Labs reviewed. WBC now 0.9. ANC is very low. Cultures all negative so far. AFB negative 2 for broch/BAL  and negative times one per trach aspirate.  Assessment: Febrile neutropenia. Severe.  Acute hypoxemic respiratory failure secondary to pneumonia in immunocompromised host, pulmonary edema.  AML  Plan : As above 1. Continue ventilatory support. PST but doubt we can extubate soon.  2. Continue broad-spectrum antimicrobial with vancomycin, meropenem, acyclovir, Eraxis. I have kept her on broad-spectrum antimicrobial coverage because of severe neutropenia. Keeping an eye on her creatinine as she is on acyclovir. All her cultures have been negative. 3. Gentle diuresis as she has volume overload. Lasix daily. Her blood pressure dropped with twice a day Lasix. 4. If blood pressure drops, suggest starting Neo-Synephrine peripherally. 5. Continue fentanyl drip and versed pushes.  6. Continue tubefeeds. She had vomiting on higher rate of tube feeds. We're slowly increasing her tube feed rate. We increased it to 35 ML's an hour today from 25 ML's yesterday. So far, she is tolerating tube feeds. If she vomits, need to decrease her rate. 7. Full DNR.  If she worsens clinically, suggest speaking with her husband. He is reasonable. He is ready to make her comfort care if she clinically deteriorates.    I have spent 30  minutes of critical care time  with this patient today.  Family :Family updated at length today.  Spoke to husband and son.    Monica Becton, MD 05/04/2016, 11:40 AM [facility] Pager [phone] After 3 pm or if no answer, call [phone]

## 2016-05-04 NOTE — Progress Notes (Signed)
Pharmacy Antibiotic Note  Kristina Meyer is a 75 y.o. female admitted on 04/23/2016 with Neutropenic fever.  Pharmacy has been consulted for Vancomycin dosing.  12/8 vancomycin level is at goal.  Plan: This patient's current antibiotics will be continued without adjustments.  Height: 5\' 2"  (157.5 cm) Weight: 156 lb 1.4 oz (70.8 kg) IBW/kg (Calculated) : 50.1  Temp (24hrs), Avg:99.8 F (37.7 C), Min:98.8 F (37.1 C), Max:101.3 F (38.5 C)   Recent Labs Lab 04/30/16 0425  05/01/16 0323 05/01/16 0347 05/01/16 1600 05/02/16 0403 05/03/16 0325 05/04/16 0308  WBC 0.4*  --   --  0.5*  --  0.7* 0.7* 0.9*  CREATININE 0.41*  < >  --  0.47 0.42* 0.50 0.47 0.37*  VANCOTROUGH  --   --  17  --   --   --   --  19  < > = values in this interval not displayed.  Estimated Creatinine Clearance: 54.3 mL/min (by C-G formula based on SCr of 0.37 mg/dL (L)).    Allergies  Allergen Reactions  . Other Other (See Comments)    GENERAL Anesthesia, vomiting    Antimicrobials this admission: 11/27 cefepime >>11/28 11/28 Vancomycin >> 11/28 Meropenem >> 11/27 azithromycin >> 12/01 11/29 Eraxis (empiric)>> 12/2 acyclovir (empiric)>>  Dose adjustments this admission: 12/2 VT at 0915 = 7 (500 mg q12h) -->increased to 500 mg q8h 12/3 VT at 1821= 10 (500mg  q8h) >>increased to 750mg  q8h 12/5 VT @ 0330 = 17 (750mg  q8h) 12/8 VT @ 0300 = 19 (750mg  iv q8hr)  Microbiology results: 11/27 BCx x2: neg FINAL 11/27 UCx: insignificant growth 11/27: Legionella antigen: neg 11/27: Strep antigen: neg 11/28 MRSA PCR (-) 11/28 bcx x2: NGF 11/29 Pleural fluid: NGF 11/29 TA: neg FINAL -------------------------------------------- 12/4 AFB from BAL: smear neg, cx pending 12/4 Fungus cx from BAL: sent 12/5 AFB (trach asp): smear = neg, culture pending 12/4 BAL: ngtd  Thank you for allowing pharmacy to be a part of this patient's care.  Kristina Meyer 05/04/2016 4:30 AM

## 2016-05-04 NOTE — Progress Notes (Signed)
  Date:  May 04, 2016 Chart reviewed for concurrent status and case management needs. Will continue to follow patient progress. Failed vent wean this am, iv sedation and remains on full vent support. Discharge Planning: following for needs Expected discharge date: [id number] Velva Harman, BSN, [place], [place]

## 2016-05-04 NOTE — Progress Notes (Signed)
Nutrition Follow-up  DOCUMENTATION CODES:   Not applicable  INTERVENTION:  - Will increase TF regimen: Vital 1.2 @ 35 mL/hr with 30 mL Prostat BID. This regimen provides 1208 kcal (85.5% estimated kcal need), 93 grams of protein (100% minimum estimated protein need), and 681 mL free water.   - Free water flush, per MD/NP, if desired. - RD will follow-up 12/10.  NUTRITION DIAGNOSIS:   Inadequate oral intake related to inability to eat as evidenced by NPO status. -ongoing  GOAL:   Patient will meet greater than or equal to 90% of their needs -unmet for kcal with updated TF regimen.  MONITOR:   TF tolerance, Vent status, Weight trends, Labs, I & O's  ASSESSMENT:   75 y.o. 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 The patient was in her usual state of health, got her Cytovene infusions last last Tuesday, and then over the last 3 or 4 days has developed progressive weakness and "just feeling awful". At first she thought she was just getting anemic again because of how tired she was over the weekend, but then tonight at 2AM she woke with fever to 102F, vomited, had worse malaise and chills and came to the ER.   She has had no dysuria or other urinary irritative symptoms.  She has had a new nonproductive cough this week.  No nose bleeds, bleeding from gums.  12/8 Pt continues with OGT and is currently receiving Vital 1.2 @ 25 mL/hr with 30 mL Prostat BID. Spoke with RN who reports no noted signs of intolerance this shift and no report of this from night shift. Confirmed with RN that pt had a "smear" BM early this AM and that she had no emesis overnight or this AM.   Spoke with Richardson Landry, PCCM NP, on the phone who states okay to advance TF rate today. Will change regimen as outlined above and communicated this with RN. Per chart review, weight +1.9 kg from yesterday and +9.8 kg from admission. Continue to use admission weight  (61 kg) in estimating nutrition needs.  Patient is currently intubated on ventilator support MV: 5.9 L/min Temp (24hrs), Avg:99.7 F (37.6 C), Min:98.6 F (37 C), Max:101.3 F (38.5 C) BP: 154/52 and MAP: 79  Medications reviewed; 20 mg IV Pepcid BID, sliding scale Novolog, PRN IV Zofran. Labs reviewed; CBGs: 121 and 123 mg/dL, BUN: 24 mg/dL, creatinine: 0.37 mg/dL, Ca: 7.6 mg/dL.  Drip: Fentanyl @ 50 mcg/hr at time of RD visit this AM.     12/7 - Pt with OGT and continues with Vital 1.2 @ 15 mL/hr.  - Talked with Dr. Corrie Dandy and plan to increase to Vital 1.2 @ 25 mL/hr at this time.  - Estimated kcal need updated this AM based on current Tmax and Ve.  - Based on this, updated goal for TF is: Vital 1.2 @ 50 mL/hr which will provide 1440 kcal, 90 grams of protein, and 973 mL free water.  - Weight -1.1 kg from yesterday but is +8.9 kg from admission.  - Pt's last BM was 12/5 and RN confirms no emesis yesterday, overnight, or this AM.   Patient is currently intubated on ventilator support MV: 6.9 L/min Temp (24hrs), Avg:100.4 F (38 C), Min:98.8 F (37.1 C), Max:101.3 F (38.5 C) BP: 123/43 and MAP: 68. Drip: Fentanyl @ 25 mcg/hr.    12/6 - Pt continues with OGT and is receiving Vital 1.2 @ 15  mL/hr which is providing 432 kcal, 27 grams of protein, and 292 mL free water.  - Spoke with Dr. Corrie Dandy earlier this AM and plan is to continue at this rate until tomorrow AM. - RN reports pt had a BM yesterday and has had no further vomiting, to her knowledge, since re-start of TF yesterday afternoon.  - Dr. Corrie Dandy' note from this AM states pt is not yet ready to trial vent weaning.  - Pt is +9 kg since admission; will continue to monitor weight trends.  Patient is currently intubated on ventilator support MV: 11L/min Temp (24hrs), Avg:100.3 F (37.9 C), Min:99.1 F (37.3 C), Max:101.1 F (38.4 C) BP: 144/61 and MAP: 88.    Diet Order:  Diet NPO time specified  Skin:   Reviewed, no issues  Last BM:  12/5; smear 12/8 AM  Height:   Ht Readings from Last 1 Encounters:  04/25/16 5\' 2"  (1.575 m)    Weight:   Wt Readings from Last 1 Encounters:  05/04/16 156 lb 1.4 oz (70.8 kg)    Ideal Body Weight:  50 kg  BMI:  Body mass index is 28.55 kg/m.  Estimated Nutritional Needs:   Kcal:  [id number]  Protein:  90-100g  Fluid:  1.7L/day  EDUCATION NEEDS:   No education needs identified at this time    Jarome Matin, MS, RD, LDN, CNSC Inpatient Clinical Dietitian Pager # [phone] After hours/weekend pager # [phone]

## 2016-05-04 NOTE — Progress Notes (Signed)
Ms. Crews is still on the ventilator. It sounds like they tried to wean her down. Does not size that she did well with this.  Her chest x-ray today showed stable pulmonary infiltrates.  Her white cell count has come up a little bit more. It is now 0.9. She is on Neupogen. Hopefully, we will see continuing to get better. Her platelet count is stable at 256,000. Her hemoglobin is 7.2. She may need to be transfused.  Her potassium is 3.5. Her creatinine is 0.31. She is getting tube feeds. I think these are helping.  The cellulitis in the right forearm looks a little bit more discrete. It looks little more red. She is on broad-spectrum antibiotics.  I know that everybody is trying real hard to get her back to her regular state of health. She has been intubated now for probably 2 weeks. Her white cell count certainly has taken his time coming back.  I think that we have to a bone marrow test on her next week. I want to try to get her white cell count elevated better and see how her marrow looks at that point.  There really is no obvious change in her physical exam from my point of view. Again she has that cellulitis in the distal right forearm.  The staff in the ICU on doing a really great job in trying to pull her through this area did I hope that she can pull through. I just worry that since she has been on the ventilator so long that it might be tough for her to bounce back in having another strength to be extubated.  Lattie Haw, MD  [place] 6:7-8

## 2016-05-05 ENCOUNTER — Inpatient Hospital Stay (HOSPITAL_COMMUNITY): Payer: Medicare Other

## 2016-05-05 DIAGNOSIS — G9340 Encephalopathy, unspecified: Secondary | ICD-10-CM

## 2016-05-05 LAB — BASIC METABOLIC PANEL
ANION GAP: 4 — AB (ref 5–15)
BUN: 22 mg/dL — ABNORMAL HIGH (ref 6–20)
CALCIUM: 7.8 mg/dL — AB (ref 8.9–10.3)
CO2: 36 mmol/L — AB (ref 22–32)
Chloride: 101 mmol/L (ref 101–111)
Glucose, Bld: 147 mg/dL — ABNORMAL HIGH (ref 65–99)
Potassium: 3.6 mmol/L (ref 3.5–5.1)
SODIUM: 141 mmol/L (ref 135–145)

## 2016-05-05 LAB — GLUCOSE, CAPILLARY
GLUCOSE-CAPILLARY: 102 mg/dL — AB (ref 65–99)
GLUCOSE-CAPILLARY: 133 mg/dL — AB (ref 65–99)
Glucose-Capillary: 145 mg/dL — ABNORMAL HIGH (ref 65–99)
Glucose-Capillary: 122 mg/dL — ABNORMAL HIGH (ref 65–99)
Glucose-Capillary: 120 mg/dL — ABNORMAL HIGH (ref 65–99)
Glucose-Capillary: 139 mg/dL — ABNORMAL HIGH (ref 65–99)

## 2016-05-05 LAB — CBC
HCT: 22.1 % — ABNORMAL LOW (ref 36.0–46.0)
Hemoglobin: 7.1 g/dL — ABNORMAL LOW (ref 12.0–15.0)
MCH: 32 pg (ref 26.0–34.0)
MCHC: 32.1 g/dL (ref 30.0–36.0)
MCV: 99.5 fL (ref 78.0–100.0)
PLATELETS: 220 10*3/uL (ref 150–400)
RBC: 2.22 MIL/uL — ABNORMAL LOW (ref 3.87–5.11)
RDW: 18 % — AB (ref 11.5–15.5)
WBC: 1.4 10*3/uL — AB (ref 4.0–10.5)

## 2016-05-05 LAB — PHOSPHORUS: PHOSPHORUS: 3 mg/dL (ref 2.5–4.6)

## 2016-05-05 LAB — MAGNESIUM: MAGNESIUM: 1.8 mg/dL (ref 1.7–2.4)

## 2016-05-05 MED ORDER — SODIUM CHLORIDE 0.9 % IV BOLUS (SEPSIS)
500.0000 mL | Freq: Once | INTRAVENOUS | Status: AC
  Administered 2016-05-05: 500 mL via INTRAVENOUS

## 2016-05-05 MED ORDER — MAGNESIUM SULFATE 2 GM/50ML IV SOLN
2.0000 g | Freq: Once | INTRAVENOUS | Status: AC
  Administered 2016-05-05: 2 g via INTRAVENOUS
  Filled 2016-05-05: qty 50

## 2016-05-05 MED ORDER — ALBUMIN HUMAN 25 % IV SOLN
12.5000 g | Freq: Once | INTRAVENOUS | Status: AC
  Administered 2016-05-05: 12.5 g via INTRAVENOUS
  Filled 2016-05-05: qty 50

## 2016-05-05 MED ORDER — POTASSIUM CHLORIDE 20 MEQ/15ML (10%) PO SOLN
40.0000 meq | Freq: Once | ORAL | Status: AC
  Administered 2016-05-05: 40 meq
  Filled 2016-05-05: qty 30

## 2016-05-05 MED ORDER — DEXMEDETOMIDINE HCL IN NACL 200 MCG/50ML IV SOLN
0.4000 ug/kg/h | INTRAVENOUS | Status: DC
  Administered 2016-05-05 (×3): 1 ug/kg/h via INTRAVENOUS
  Administered 2016-05-05: 0.4 ug/kg/h via INTRAVENOUS
  Administered 2016-05-05: 0.6 ug/kg/h via INTRAVENOUS
  Administered 2016-05-06: 0.8 ug/kg/h via INTRAVENOUS
  Administered 2016-05-06 (×6): 1 ug/kg/h via INTRAVENOUS
  Administered 2016-05-06 (×2): 0.8 ug/kg/h via INTRAVENOUS
  Administered 2016-05-06 – 2016-05-07 (×2): 1 ug/kg/h via INTRAVENOUS
  Administered 2016-05-07: 1.2 ug/kg/h via INTRAVENOUS
  Administered 2016-05-07: 1.188 ug/kg/h via INTRAVENOUS
  Administered 2016-05-07: 1.2 ug/kg/h via INTRAVENOUS
  Administered 2016-05-07: 1 ug/kg/h via INTRAVENOUS
  Administered 2016-05-07 (×2): 1.2 ug/kg/h via INTRAVENOUS
  Administered 2016-05-08: 0.99 ug/kg/h via INTRAVENOUS
  Administered 2016-05-08 (×2): 1.2 ug/kg/h via INTRAVENOUS
  Administered 2016-05-08: 1 ug/kg/h via INTRAVENOUS
  Administered 2016-05-08 (×7): 1.2 ug/kg/h via INTRAVENOUS
  Administered 2016-05-09: 0.8 ug/kg/h via INTRAVENOUS
  Administered 2016-05-09 (×3): 1.2 ug/kg/h via INTRAVENOUS
  Administered 2016-05-09: 1 ug/kg/h via INTRAVENOUS
  Administered 2016-05-09 (×2): 0.8 ug/kg/h via INTRAVENOUS
  Administered 2016-05-09: 1.2 ug/kg/h via INTRAVENOUS
  Administered 2016-05-09: 1 ug/kg/h via INTRAVENOUS
  Administered 2016-05-10: 0.8 ug/kg/h via INTRAVENOUS
  Administered 2016-05-10: 1 ug/kg/h via INTRAVENOUS
  Filled 2016-05-05 (×47): qty 50

## 2016-05-05 MED ORDER — FUROSEMIDE 10 MG/ML IJ SOLN
40.0000 mg | Freq: Three times a day (TID) | INTRAMUSCULAR | Status: DC
  Administered 2016-05-05: 40 mg via INTRAVENOUS
  Filled 2016-05-05: qty 4

## 2016-05-05 NOTE — Progress Notes (Signed)
[facility] Progress Note Patient Name: Kristina Meyer DOB: 04-Jul-1940 MRN: [id number]   Date of Service  05/05/2016  HPI/Events of Note  Hypotension - BP = 82/38. Last CVP = 7.  eICU Interventions  Will order: 1. D/C Lasix. 2. 25% albumin 12.5 gm IV now.  3. 0.9 NaCl 500 mL IV over 30 minutes now.      Intervention Category Major Interventions: Hypotension - evaluation and management  Sommer,Steven Eugene 05/05/2016, 6:30 PM

## 2016-05-05 NOTE — Progress Notes (Signed)
Patient with significant erythema and edema to her right hand and forearm. RN from previous shift marked edges. Dr. Corrie Dandy looked at the area and placed a consult for WOC. [place] nurse spoke with RN--see WOC consult note. After looking back through documentation it was noted that pt did have a previous IV to that site. IV team nurse consulted to look at site. Site is being elevated and monitored for changes.

## 2016-05-05 NOTE — Progress Notes (Signed)
PULMONARY / CRITICAL CARE MEDICINE   Name: Kristina Meyer MRN: [id number] DOB: 04-Aug-1940    ADMISSION DATE:  04/23/2016 CONSULTATION DATE:  04/24/16  REFERRING MD:  Dr Carolin Sicks, [place]: HCAP, neutropenic sepsis   LINES/TUBES: Port-s-cath >>  OETT 11/29>>> Left IJ CVL 11/29>>>   SIGNIFICANT EVENTS: 11/29 thora. Incomplete drainage of left pleural space 11/30 Chest tube placed.  12/4 Bronch 12/8  - Required IVF for low bp. Lasix held    SUBJECTIVE/OVERNIGHT/INTERVAL HX 12/9 - new Rt distal forearm redness x few days. Not on pressors. Failed SBT yesterday after 3h with agitation /desaturation /tacycardia. Currently on fent 50 and doing sbt and followng commands with calm. Fever coming down   VITAL SIGNS: BP (!) 147/63 (BP Location: Left Arm)   Pulse (!) 105   Temp 98.8 F (37.1 C) (Core (Comment)) Comment (Src): foley  Resp (!) 9   Ht 5\' 2"  (1.575 m)   Wt 72.7 kg (160 lb 4.4 oz)   SpO2 99%   BMI 29.31 kg/m   HEMODYNAMICS: CVP:  [7 mmHg-14 mmHg] 7 mmHg  VENTILATOR SETTINGS: Vent Mode: PSV;CPAP FiO2 (%):  [30 %-60 %] 40 % Set Rate:  [16 bmp] 16 bmp Vt Set:  [300 mL] 300 mL PEEP:  [8 cmH20] 8 cmH20 Pressure Support:  [10 cmH20] 10 cmH20 Plateau Pressure:  [14 cmH20-20 cmH20] 17 cmH20  INTAKE / OUTPUT: I/O last 3 completed shifts: In: 3587.8 [I.V.:637.8; NG/GT:1179.8; IV Piggyback:1770.2] Out: [id number] [Urine:1725; Emesis/NG output:320; Chest Tube:490]  PHYSICAL EXAMINATION: General:  Ill appearing elderly woman, sedated on vent. Follows simple commands.  Neuro:  Sedated on ven fent 6mcg, non-focal generalized weakness. Di d lfit her head off bed to command HEENT:  OP clear, PERRL, orally intubated   Cardiovascular:  Tachycardic, regular Lungs:    + equal chest rise decreased bilateral. Left chest tube w/ serous sang drainage  Crackles bibasilar. Sync with vent on PSV Abdomen:  Benign no OM + bowel sounds Musculoskeletal:  Gr 2 edema. More edema in  BUE.  Skin:  No rash or mottling   LABS: PULMONARY  Recent Labs Lab 05/04/16 0935  PHART 7.408  PCO2ART 54.4*  PO2ART 79.3*  HCO3 33.6*  O2SAT 95.8    CBC  Recent Labs Lab 05/03/16 0325 05/04/16 0308 05/05/16 0511  HGB 7.6* 7.2* 7.1*  HCT 23.3* 22.3* 22.1*  WBC 0.7* 0.9* 1.4*  PLT 267 256 220    COAGULATION No results for input(s): INR in the last 168 hours.  CARDIAC  No results for input(s): TROPONINI in the last 168 hours. No results for input(s): PROBNP in the last 168 hours.   CHEMISTRY  Recent Labs Lab 05/01/16 0347 05/01/16 1600 05/02/16 0403  05/03/16 0325 05/04/16 0308 05/04/16 1648 05/05/16 0511  NA 141 138 139  --  139 140 138 141  K 3.6 3.8 3.5  < > 3.3* 3.7 3.5 3.6  CL 105 104 102  --  100* 103 100* 101  CO2 28 30 32  --  35* 33* 33* 36*  GLUCOSE 121* 158* 137*  --  137* 128* 135* 147*  BUN 24* 25* 21*  --  21* 24* 22* 22*  CREATININE 0.47 0.42* 0.50  --  0.47 0.37* 0.31* <0.30*  CALCIUM 7.5* 7.3* 6.9*  --  7.3* 7.6* 7.7* 7.8*  MG 1.9  --  1.6*  --  2.1 2.1  --  1.8  PHOS 2.1* 3.0 2.7  --  3.2 3.3  --  3.0  < > = values in this interval not displayed. CrCl cannot be calculated (This lab value cannot be used to calculate CrCl because it is not a number: <0.30).   LIVER No results for input(s): AST, ALT, ALKPHOS, BILITOT, PROT, ALBUMIN, INR in the last 168 hours.   INFECTIOUS No results for input(s): LATICACIDVEN, PROCALCITON in the last 168 hours.   ENDOCRINE CBG (last 3)   Recent Labs  05/04/16 2304 05/05/16 0350 05/05/16 0746  GLUCAP 129* 145* 102*         IMAGING x48h  - image(s) personally visualized  -   highlighted in bold Dg Chest Port 1 View  Result Date: 05/05/2016 CLINICAL DATA:  Follow-up examination for acute respiratory failure. EXAM: PORTABLE CHEST 1 VIEW COMPARISON:  Prior radiograph from 05/04/2016. FINDINGS: Right-sided Port-A-Cath in place, stable. Left IJ approach central venous catheter is  unchanged with tip overlying the distal SVC. Enteric tube courses in the the abdomen. Side hole projects just distal to the GE junction. Pigtail drainage catheter in place over the left lung base. Cardiac and mediastinal silhouettes are stable in size and contour, and remain within normal limits. Aortic atherosclerosis noted. Lungs normally inflated. Veiling opacity overlying the right lung base favored to largely reflect effusion, similar to previous. Perihilar vascular congestion similar to previous. Patchy bibasilar opacities are slightly were the stent, with increased right upper lobe opacity as well. No pneumothorax. Osseous structures unchanged. IMPRESSION: 1. Slight interval worsening in bilateral pulmonary infiltrates as compared to 05/04/16. 2. Veiling opacity overlying the right lung base, compatible with pleural effusion. 3. Left-sided chest tube remains in place.  No pneumothorax. Electronically Signed   By: Jeannine Boga M.D.   On: 05/05/2016 06:09   Dg Chest Port 1 View  Result Date: 05/04/2016 CLINICAL DATA:  Acute respiratory failure. EXAM: PORTABLE CHEST 1 VIEW COMPARISON:  05/02/2016.  04/30/2016. FINDINGS: Endotracheal tube noted 1.7 cm above the carina. PowerPort catheter and left IJ line in stable position. NG tube in stable position. PowerPort catheter in stable position. Left chest tube in stable position. No pneumothorax. Heart size stable. Persistent bilateral pulmonary infiltrates and/or edema. Small right pleural effusion. No pneumothorax . IMPRESSION: 1. Lines and tubes including left chest tube in stable position. No pneumothorax. 2. Distant bilateral pulmonary infiltrates and/or edema without interim change. Small right pleural effusion . Electronically Signed   By: Marcello Moores  Register   On: 05/04/2016 08:29         STUDIES:  CT chest 11/28 >> No definite CT evidence for acute pulmonary embolus or aortic Dissection.  Moderate large left pleural effusion. Dense left lower  lobe consolidation with partial consolidation in the lingula and right lower lobe which may represent pneumonia.  Mild mid esophageal thickening\  Echo 12/3 > EF 35%, hypokinesis (diffuse)    ASSESSMENT / PLAN:  PULMONARY A: Acute hypoxic respiratory failure 2/2 Bilateral PNA/HCAP  in the ICH +/- Pulm edema/Diffuse Hypokinesis. 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  PTB being R/O as well. Possible recent exposure to a neighbor with Latent PTB Complicated left effusion (exudate)-->SP CT guided CT 11/30. Pleural cytology (-) for CA Chest pain\   - doing sbt but failed yessterday due to neuromuscular weakness, pulm infiltrates and agitation  P:   PSV as tolerated anticippate slow wean for extubation   CARDIOVASCULAR A:  SIRS/sepsis (neutropenic) Transient hypotension, improved w IVF CHE EF 35%, with acute pulm edema and diffuse hypokinesis/demand ischemia   -  not on pressors since 12/22. Getting lasix. On 05/05/2016 + 10L balance P:  Telemetry Lasix aggressive  Monitor creat (lasix, acyclovir, will dc vanc) Plan to get her better from infection and potentially consult cards re: echo findings.   RENAL Lab Results  Component Value Date   CREATININE <0.30 (L) 05/05/2016   CREATININE 0.31 (L) 05/04/2016   CREATININE 0.37 (L) 05/04/2016   CREATININE 0.8 04/16/2016   CREATININE 0.8 04/09/2016   CREATININE 0.7 04/04/2016   CREATININE 0.8 02/08/2016   CREATININE 1.4 (H) 12/21/2015    Recent Labs Lab 05/04/16 0308 05/04/16 1648 05/05/16 0511  K 3.7 3.5 3.6     A:   Mild acute renal insufficiency,resolved   - on 05/05/2016 - has low K and low mag  P:   Replete mag and k Follow BMP and UOP replet lytes  GASTROINTESTINAL A:   SUP Nutrition Vomiting on 12/3 P:   H2B for SUP Cont tubefeeds. Will do trickle feed given episodes of vomiting.  If she vomits again, plan to hold off on TF.  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 Persistent Febrile Neutropenia Thrombocytopenia, resolved Anemia s/p PRBC 11/27, 12/5  No overt bleeding  P Follow CBC + diff Cont neupogen, dose increased on 12/4    INFECTIOUS  CULTURES: Blood 11/27 >>  (-) Urine 11/27 >> insignificant growth Blood 11/28 >> (-) Urine 11/28 >> (-) Viral resp panel 11/28 >> neg Pleural fluid 11/29>>>no orgs  But abundant wbc BAL sent for everything 12/4 >   Recent Results (from the past 240 hour(s))  Culture, respiratory (NON-Expectorated)     Status: None   Collection Time: 04/25/16 12:54 PM  Result Value Ref Range Status   Specimen Description TRACHEAL ASPIRATE  Final   Special Requests NONE  Final   Gram Stain   Final    FEW WBC PRESENT,BOTH PMN AND MONONUCLEAR NO ORGANISMS SEEN    Culture   Final    NO GROWTH 2 DAYS Performed at [facility]    Report Status 04/28/2016 FINAL  Final  Body fluid culture     Status: None   Collection Time: 04/25/16  6:30 PM  Result Value Ref Range Status   Specimen Description PLEURAL FLUID  Final   Special Requests NONE  Final   Gram Stain   Final    ABUNDANT WBC PRESENT, PREDOMINANTLY PMN NO ORGANISMS SEEN    Culture   Final    NO GROWTH 3 DAYS Performed at [facility]    Report Status 04/28/2016 FINAL  Final  Culture, respiratory (NON-Expectorated)     Status: None   Collection Time: 04/30/16  1:45 PM  Result Value Ref Range Status   Specimen Description BRONCHIAL ALVEOLAR LAVAGE LLL  Final   Special Requests Immunocompromised  Final   Gram Stain   Final    FEW WBC PRESENT,BOTH PMN AND MONONUCLEAR NO ORGANISMS SEEN    Culture   Final    Consistent with normal respiratory flora. Performed at [facility]    Report Status 05/03/2016 FINAL  Final  Acid Fast Smear (AFB)     Status: None    Collection Time: 04/30/16  1:45 PM  Result Value Ref Range Status   AFB Specimen Processing Concentration  Final   Acid  Fast Smear Negative  Final    Comment: (NOTE) Performed At: [facility] [place], [place] [id number] Lindon Romp MD [id number]    Source (AFB) BRONCHIAL ALVEOLAR LAVAGE  Final    Comment: LLL  Fungus Culture With Stain     Status: None (Preliminary result)   Collection Time: 04/30/16  1:45 PM  Result Value Ref Range Status   Fungus Stain Final report  Final    Comment: (NOTE) Performed At: [facility] [place], [place] [id number] Lindon Romp MD [id number]    Fungus (Mycology) Culture PENDING  Incomplete   Fungal Source BRONCHIAL ALVEOLAR LAVAGE  Final    Comment: LLL  Fungus Culture Result     Status: None   Collection Time: 04/30/16  1:45 PM  Result Value Ref Range Status   Result 1 Comment  Final    Comment: (NOTE) KOH/Calcofluor preparation:  no fungus observed. Performed At: [facility] [place], [place] [id number] Lindon Romp MD [id number]   Culture, respiratory (NON-Expectorated)     Status: None   Collection Time: 04/30/16  1:50 PM  Result Value Ref Range Status   Specimen Description BRONCHIAL ALVEOLAR LAVAGE RUL  Final   Special Requests Immunocompromised  Final   Gram Stain   Final    FEW WBC PRESENT, PREDOMINANTLY MONONUCLEAR NO ORGANISMS SEEN    Culture   Final    NO GROWTH 2 DAYS Performed at [facility]    Report Status 05/03/2016 FINAL  Final  Acid Fast Smear (AFB)     Status: None   Collection Time: 04/30/16  1:50 PM  Result Value Ref Range Status   AFB Specimen Processing Concentration  Final   Acid Fast Smear Negative  Final    Comment: (NOTE) Performed At: [facility] [place], [place] [id number] Lindon Romp MD [id number]    Source (AFB) BRONCHIAL ALVEOLAR LAVAGE  Final    Comment: RUL   Fungus Culture With Stain     Status: None (Preliminary result)   Collection Time: 04/30/16  1:50 PM  Result Value Ref Range Status   Fungus Stain Final report  Final    Comment: (NOTE) Performed At: [facility] [place], [place] [id number] Lindon Romp MD [id number]    Fungus (Mycology) Culture PENDING  Incomplete   Fungal Source BRONCHIAL ALVEOLAR LAVAGE  Final    Comment: RUL  Fungus Culture Result     Status: None   Collection Time: 04/30/16  1:50 PM  Result Value Ref Range Status   Result 1 Comment  Final    Comment: (NOTE) KOH/Calcofluor preparation:  no fungus observed. Performed At: [facility] [place], [place] [id number] Lindon Romp MD [id number]   Acid Fast Smear (AFB)     Status: None   Collection Time: 05/01/16 11:34 AM  Result Value Ref Range Status   AFB Specimen Processing Concentration  Final   Acid Fast Smear Negative  Final    Comment: (NOTE) Performed At: [facility] [place], [place] [id number] Lindon Romp MD [id number]    Source (AFB) TRACHEAL ASPIRATE  Final     A:   Febrile neutropenia, persistently neutropenic despite broad spectrum abx. Cultures (-) RUL and LLL HCAP Concern for Opportunistic infection given persistent neutropenia and AML and recent chemo R/O PTB as well given recent possible exposure to a neighbor with latent PTB Complicated  left pleural effusion  Severe sepsis without shock P:    ANTIBIOTICS: Cefepime 11/27 > 11/28 azithro 11/27 >> 12/2 vanco 11/28 >> 12/9 meropenem 11/28 >>12/9  anidulafungin 11/30>>> Acyclovir 12/1 >>  She's neutropenic but all cultures to date are negative. Will continue and eraxis, acyclovir for now. DC antibacteria   ENDOCRINE CBG (last 3)   Recent Labs  05/04/16 2304 05/05/16 0350 05/05/16 0746  GLUCAP 129* 145* 102*     A:   No hx DM P:   Follow glucose on BMP  NEUROLOGIC A:   Anxiety  / depression Slowly waking up  - follows commands but gets agitated when off sedation  P:   RASS goal: -1 to -2 Paxil held off on 12/3  2/2 decreased sensorium Fentanyl drip. PRN versed.  Start precedex gtt   FAMILY  - Updates: Pt is a l DNR.  Husband and son undated at bedside on 12/5. Husband updated on 12/6. None at bedside 05/05/2016 . -  Unclear if pressors ok but possibly should be  - Inter-disciplinary family meet or Palliative Care meeting due by:  12/8    The patient is critically ill with multiple organ systems failure and requires high complexity decision making for assessment and support, frequent evaluation and titration of therapies, application of advanced monitoring technologies and extensive interpretation of multiple databases.   Critical Care Time devoted to patient care services described in this note is  30  Minutes. This time reflects time of care of this signee Dr Brand Males. This critical care time does not reflect procedure time, or teaching time or supervisory time of PA/NP/Med student/Med Resident etc but could involve care discussion time    Dr. Brand Males, M.D., [facility].C.P Pulmonary and Critical Care Medicine Staff Physician [facility] Pager: [phone], If no answer or between  15:00h - 7:00h: call [phone]  05/05/2016 9:11 AM

## 2016-05-06 ENCOUNTER — Inpatient Hospital Stay (HOSPITAL_COMMUNITY): Payer: Medicare Other

## 2016-05-06 LAB — PREPARE RBC (CROSSMATCH)

## 2016-05-06 LAB — GLUCOSE, CAPILLARY
GLUCOSE-CAPILLARY: 144 mg/dL — AB (ref 65–99)
GLUCOSE-CAPILLARY: 131 mg/dL — AB (ref 65–99)
GLUCOSE-CAPILLARY: 145 mg/dL — AB (ref 65–99)
GLUCOSE-CAPILLARY: 117 mg/dL — AB (ref 65–99)
Glucose-Capillary: 116 mg/dL — ABNORMAL HIGH (ref 65–99)
Glucose-Capillary: 168 mg/dL — ABNORMAL HIGH (ref 65–99)

## 2016-05-06 LAB — CBC WITH DIFFERENTIAL/PLATELET
BASOS ABS: 0 10*3/uL (ref 0.0–0.1)
Basophils Relative: 0 %
EOS ABS: 0 10*3/uL (ref 0.0–0.7)
Eosinophils Relative: 0 %
HEMATOCRIT: 21.3 % — AB (ref 36.0–46.0)
HEMOGLOBIN: 6.9 g/dL — AB (ref 12.0–15.0)
LYMPHS PCT: 32 %
Lymphs Abs: 1 10*3/uL (ref 0.7–4.0)
MCH: 32.1 pg (ref 26.0–34.0)
MCHC: 32.4 g/dL (ref 30.0–36.0)
MCV: 99.1 fL (ref 78.0–100.0)
MONOS PCT: 11 %
Monocytes Absolute: 0.4 10*3/uL (ref 0.1–1.0)
NEUTROS PCT: 57 %
Neutro Abs: 1.8 10*3/uL (ref 1.7–7.7)
Platelets: 197 10*3/uL (ref 150–400)
RBC: 2.15 MIL/uL — AB (ref 3.87–5.11)
RDW: 17.6 % — ABNORMAL HIGH (ref 11.5–15.5)
WBC: 3.2 10*3/uL — AB (ref 4.0–10.5)

## 2016-05-06 LAB — HEPATIC FUNCTION PANEL
ALT: 12 U/L — AB (ref 14–54)
AST: 18 U/L (ref 15–41)
Albumin: 1.8 g/dL — ABNORMAL LOW (ref 3.5–5.0)
Alkaline Phosphatase: 97 U/L (ref 38–126)
Bilirubin, Direct: 0.3 mg/dL (ref 0.1–0.5)
Indirect Bilirubin: 0.7 mg/dL (ref 0.3–0.9)
TOTAL PROTEIN: 4.6 g/dL — AB (ref 6.5–8.1)
Total Bilirubin: 1 mg/dL (ref 0.3–1.2)

## 2016-05-06 LAB — BASIC METABOLIC PANEL
ANION GAP: 5 (ref 5–15)
BUN: 21 mg/dL — ABNORMAL HIGH (ref 6–20)
CO2: 36 mmol/L — ABNORMAL HIGH (ref 22–32)
Calcium: 7.9 mg/dL — ABNORMAL LOW (ref 8.9–10.3)
Chloride: 99 mmol/L — ABNORMAL LOW (ref 101–111)
Creatinine, Ser: 0.38 mg/dL — ABNORMAL LOW (ref 0.44–1.00)
GLUCOSE: 141 mg/dL — AB (ref 65–99)
POTASSIUM: 3.5 mmol/L (ref 3.5–5.1)
SODIUM: 140 mmol/L (ref 135–145)

## 2016-05-06 LAB — MAGNESIUM: MAGNESIUM: 1.9 mg/dL (ref 1.7–2.4)

## 2016-05-06 LAB — PHOSPHORUS: PHOSPHORUS: 2.7 mg/dL (ref 2.5–4.6)

## 2016-05-06 MED ORDER — FUROSEMIDE 10 MG/ML IJ SOLN
40.0000 mg | Freq: Once | INTRAMUSCULAR | Status: AC
  Administered 2016-05-06: 40 mg via INTRAVENOUS
  Filled 2016-05-06: qty 4

## 2016-05-06 MED ORDER — SODIUM CHLORIDE 0.9 % IV SOLN: Freq: Once | INTRAVENOUS | Status: DC

## 2016-05-06 MED ORDER — MAGNESIUM SULFATE 2 GM/50ML IV SOLN
2.0000 g | Freq: Once | INTRAVENOUS | Status: AC
Start: 2016-05-06 — End: 2016-05-06
  Administered 2016-05-06: 2 g via INTRAVENOUS
  Filled 2016-05-06: qty 50

## 2016-05-06 MED ORDER — POTASSIUM CHLORIDE 20 MEQ/15ML (10%) PO SOLN
40.0000 meq | Freq: Once | ORAL | Status: AC
Start: 2016-05-06 — End: 2016-05-06
  Administered 2016-05-06: 40 meq
  Filled 2016-05-06: qty 30

## 2016-05-06 NOTE — Progress Notes (Signed)
[facility] Progress Note Patient Name: Kristina Meyer DOB: [birth year] MRN: [id number]   Date of Service  05/06/2016  HPI/Events of Note  Hgb drop to 6.9 from 7.1  eICU Interventions  Plan; Transfuse 1 unit pRBC Post-transfusion CBC     Intervention Category Intermediate Interventions: Other:  Kristina Meyer 05/06/2016, 5:27 AM

## 2016-05-06 NOTE — Progress Notes (Signed)
CRITICAL VALUE ALERT  Critical value received:  Hgb 6.9  Date of notification:  05/06/16  Time of notification:  0520  Critical value read back:Yes.    Nurse who received alert:  Lorne Skeens RN  MD notified (1st page):  Dr. Detterding  Time of first page:  [phone]  Responding MD:  Dr. Esau Grew  Time MD responded:  [phone]

## 2016-05-06 NOTE — Progress Notes (Signed)
PULMONARY / CRITICAL CARE MEDICINE   Name: Kristina Meyer MRN: [id number] DOB: August 22, 1940    ADMISSION DATE:  04/23/2016 CONSULTATION DATE:  04/24/16  REFERRING MD:  Dr Carolin Sicks, [place]: HCAP, neutropenic sepsis   LINES/TUBES: Port-s-cath >>  OETT 11/29>>> Left IJ CVL 11/29>>>   SIGNIFICANT EVENTS: 11/29 thora. Incomplete drainage of left pleural space 11/30 Chest tube placed.  12/4 Bronch 12/8  - Required IVF for low bp. Lasix held 12/9 - new Rt distal forearm redness x few days. Not on pressors. Failed SBT yesterday after 3h with agitation /desaturation /tacycardia. Currently on fent 50 and doing sbt and followng commands with calm. Fever coming down. Stopped anti-bacteriakl   SUBJECTIVE/OVERNIGHT/INTERVAL HX 12/10 - again failed sBT yesterdayafter few hours with taccycardia, tachypnea. WC up. Got low bp with lasix and givne albumin. 1 unit prbc this AM for hgb 6.9. Fever with nice downtred   VITAL SIGNS: BP (!) 116/41   Pulse 64   Temp 98.7 F (37.1 C) (Oral)   Resp 20   Ht 5\' 2"  (1.575 m)   Wt 73.3 kg (161 lb 9.6 oz)   SpO2 100%   BMI 29.56 kg/m   HEMODYNAMICS: CVP:  [6 mmHg] 6 mmHg  VENTILATOR SETTINGS: Vent Mode: PSV;CPAP FiO2 (%):  [40 %] 40 % Set Rate:  [16 bmp] 16 bmp Vt Set:  [300 mL] 300 mL PEEP:  [8 cmH20] 8 cmH20 Pressure Support:  [8 cmH20-10 cmH20] 8 cmH20 Plateau Pressure:  [14 cmH20-19 cmH20] 14 cmH20  INTAKE / OUTPUT: I/O last 3 completed shifts: In: 2908 [I.V.:655.1; NG/GT:1096.1; IV Piggyback:1156.8] Out: [id number] [Urine:3305; Chest Tube:500]  PHYSICAL EXAMINATION: General:  Ill appearing elderly woman, sedated on vent. Neuro:  Sedated on ven fent 17mcg and precedex, non-focal generalized weakness. Di d lfit her head off bed to command HEENT:  OP clear, PERRL, orally intubated   Cardiovascular:  Tachycardic, regular Lungs:    + equal chest rise decreased bilateral. Left chest tube w/ serous sang drainage  Crackles bibasilar.  Sync with vent on PSV Abdomen:  Benign no OM + bowel sounds Musculoskeletal:  Gr 2 edema. More edema in BUE.  Skin:  Rt distal forearm cellulitis - improved 05/06/16  LABS: PULMONARY  Recent Labs Lab 05/04/16 0935  PHART 7.408  PCO2ART 54.4*  PO2ART 79.3*  HCO3 33.6*  O2SAT 95.8    CBC  Recent Labs Lab 05/04/16 0308 05/05/16 0511 05/06/16 0444  HGB 7.2* 7.1* 6.9*  HCT 22.3* 22.1* 21.3*  WBC 0.9* 1.4* 3.2*  PLT 256 220 197    COAGULATION No results for input(s): INR in the last 168 hours.  CARDIAC  No results for input(s): TROPONINI in the last 168 hours. No results for input(s): PROBNP in the last 168 hours.   CHEMISTRY  Recent Labs Lab 05/02/16 0403  05/03/16 0325 05/04/16 0308 05/04/16 1648 05/05/16 0511 05/06/16 0011 05/06/16 0444  NA 139  --  139 140 138 141 140  --   K 3.5  < > 3.3* 3.7 3.5 3.6 3.5  --   CL 102  --  100* 103 100* 101 99*  --   CO2 32  --  35* 33* 33* 36* 36*  --   GLUCOSE 137*  --  137* 128* 135* 147* 141*  --   BUN 21*  --  21* 24* 22* 22* 21*  --   CREATININE 0.50  --  0.47 0.37* 0.31* <0.30* 0.38*  --   CALCIUM 6.9*  --  7.3* 7.6* 7.7* 7.8* 7.9*  --   MG 1.6*  --  2.1 2.1  --  1.8  --  1.9  PHOS 2.7  --  3.2 3.3  --  3.0  --  2.7  < > = values in this interval not displayed. Estimated Creatinine Clearance: 55.2 mL/min (by C-G formula based on SCr of 0.38 mg/dL (L)).   LIVER  Recent Labs Lab 05/06/16 0444  AST 18  ALT 12*  ALKPHOS 97  BILITOT 1.0  PROT 4.6*  ALBUMIN 1.8*     INFECTIOUS No results for input(s): LATICACIDVEN, PROCALCITON in the last 168 hours.   ENDOCRINE CBG (last 3)   Recent Labs  05/05/16 2329 05/06/16 0417 05/06/16 0730  GLUCAP 139* 116* 144*         IMAGING x48h  - image(s) personally visualized  -   highlighted in bold Dg Abd 1 View  Result Date: 05/05/2016 CLINICAL DATA:  OG tube placement. EXAM: ABDOMEN - 1 VIEW COMPARISON:  None. FINDINGS: The side port appears to true  preserved left upper quadrant, probably within the body of the stomach. A pigtail catheter overlies left hemidiaphragm. IMPRESSION: Location of the OG tube suggests a J-shaped stomach with the side port within the body and the distal tip in the distal stomach. Electronically Signed   By: Dorise Bullion III M.D   On: 05/05/2016 18:17   Dg Chest Port 1 View  Result Date: 05/06/2016 CLINICAL DATA:  Endotracheal tube position EXAM: PORTABLE CHEST 1 VIEW COMPARISON:  05/05/2016 FINDINGS: Endotracheal tube 3 cm above the carina. Left jugular central venous catheter tip in the SVC. Port-A-Cath tip in the SVC. Gastric tube enters the stomach. Pigtail catheter overlying the left lung base is unchanged. A progression of bilateral airspace disease may most prominent in the right lung base. Progression of right pleural effusion. No pneumothorax. IMPRESSION: Endotracheal tube in satisfactory position Progression of diffuse bilateral airspace disease right greater than left. Probable pulmonary edema. Left pleural pigtail catheter in good position.  No pneumothorax. Electronically Signed   By: Franchot Gallo M.D.   On: 05/06/2016 06:41   Dg Chest Port 1 View  Result Date: 05/05/2016 CLINICAL DATA:  Follow-up examination for acute respiratory failure. EXAM: PORTABLE CHEST 1 VIEW COMPARISON:  Prior radiograph from 05/04/2016. FINDINGS: Right-sided Port-A-Cath in place, stable. Left IJ approach central venous catheter is unchanged with tip overlying the distal SVC. Enteric tube courses in the the abdomen. Side hole projects just distal to the GE junction. Pigtail drainage catheter in place over the left lung base. Cardiac and mediastinal silhouettes are stable in size and contour, and remain within normal limits. Aortic atherosclerosis noted. Lungs normally inflated. Veiling opacity overlying the right lung base favored to largely reflect effusion, similar to previous. Perihilar vascular congestion similar to previous.  Patchy bibasilar opacities are slightly were the stent, with increased right upper lobe opacity as well. No pneumothorax. Osseous structures unchanged. IMPRESSION: 1. Slight interval worsening in bilateral pulmonary infiltrates as compared to 05/04/16. 2. Veiling opacity overlying the right lung base, compatible with pleural effusion. 3. Left-sided chest tube remains in place.  No pneumothorax. Electronically Signed   By: Jeannine Boga M.D.   On: 05/05/2016 06:09         STUDIES:  CT chest 11/28 >> No definite CT evidence for acute pulmonary embolus or aortic Dissection.  Moderate large left pleural effusion. Dense left lower lobe consolidation with partial consolidation in the lingula and right lower lobe  which may represent pneumonia.  Mild mid esophageal thickening\  Echo 12/3 > EF 35%, hypokinesis (diffuse)    ASSESSMENT / PLAN:  PULMONARY A: Acute hypoxic respiratory failure 2/2 Bilateral PNA/HCAP  in the ICH +/- Pulm edema/Diffuse Hypokinesis. 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  PTB being R/O as well. Possible recent exposure to a neighbor with Latent PTB Complicated left effusion (exudate)-->SP CT guided CT 11/30. Pleural cytology (-) for CA Chest pain\   - 05/06/16 doing sbt but failed yessterday due to neuromuscular weakness, pulm infiltrates and agitation. Got hypotensitive with lasix 05/15/16  P:   PSV as tolerated anticippate slow wean for extubation Likely needs trach if cance prognosis is ok to justify it - wil need Dr Marin Olp input   CARDIOVASCULAR A:  SIRS/sepsis (neutropenic) Transient hypotension, improved w IVF CHE EF 35%, with acute pulm edema and diffuse hypokinesis/demand ischemia   - not on pressors since 12/22. Getting lasix. On 05/06/2016 + 8.8L balance. Got hypotensive with lasix 12/189  P:  Telemetry Lasix as toleratd - 1 dose 12/10 (might ned to neo to get her to diurese if  hypotensive) Monitor creat (lasix, acyclovir, will dc vanc) Plan to get her better from infection and potentially consult cards re: echo findings.   RENAL    A:   Mild acute renal insufficiency,resolved   - on 05/06/2016 - has low K and low mag  P:   Replete mag and k Follow BMP and UOP replet lytes  GASTROINTESTINAL A:   SUP Nutrition Vomiting on 12/3 P:   H2B for SUP Cont tubefeeds. Will do trickle feed given episodes of vomiting.  If she vomits again, plan to hold off on TF.  Slowly increasing TF by 10 mls daily (now at 35 mls/hr)  HEMATOLOGIC / ONCOLOGIC  Recent Labs  05/05/16 0511 05/06/16 0444  HGB 7.1* 6.9*    A:   AML receiving treatment, last 1 week PTA Persistent Febrile Neutropenia Thrombocytopenia, resolved Anemia s/p PRBC 11/27, 12/5  No overt bleeding. WBC up  P Follow CBC + diff Dc neuopgen   INFECTIOUS  CULTURES: Blood 11/27 >>  (-) Urine 11/27 >> insignificant growth Blood 11/28 >> (-) Urine 11/28 >> (-) Viral resp panel 11/28 >> neg Pleural fluid 11/29>>>no orgs  But abundant wbc BAL sent for everything 12/4 >      A:   Febrile neutropenia, persistently neutropenic despite broad spectrum abx. Cultures (-) RUL and LLL HCAP Concern for Opportunistic infection given persistent neutropenia and AML and recent chemo R/O PTB as well given recent possible exposure to a neighbor with latent PTB Complicated left pleural effusion    Severe sepsis without shock   12/10 fever improving. Culture negative so far  P:    ANTIBIOTICS: Cefepime 11/27 > 11/28 azithro 11/27 >> 12/2 vanco 11/28 >> 12/9 meropenem 11/28 >>12/9  anidulafungin 11/30>>> (need to esbalish stop date) Acyclovir 12/1 >>  (need to stablish stop date)  ENDOCRINE CBG (last 3)   Recent Labs  05/05/16 2329 05/06/16 0417 05/06/16 0730  GLUCAP 139* 116* 144*     A:   No hx DM P:   Follow glucose on BMP  NEUROLOGIC A:   Anxiety / depression Slowly  waking up  - follows commands but gets agitated when off sedation  P:   RASS goal: -1 to -2 Paxil held off on 12/3  2/2 decreased sensorium Fentanyl drip. PRN versed.  precedex gtt   FAMILY  -  Updates: Pt is a l DNR.  Husband and son undated at bedside on 12/5. Husband updated on 12/6. None at bedside 05/06/2016 . -  Unclear if pressors ok but possibly should be  - Inter-disciplinary family meet or Palliative Care meeting due by:  12/8 - done 05/06/16 with RN Langley Gauss and husband and son - indicated likely need for trach. They are processing    The patient is critically ill with multiple organ systems failure and requires high complexity decision making for assessment and support, frequent evaluation and titration of therapies, application of advanced monitoring technologies and extensive interpretation of multiple databases.   Critical Care Time devoted to patient care services described in this note is  30  Minutes. This time reflects time of care of this signee Dr Brand Males. This critical care time does not reflect procedure time, or teaching time or supervisory time of PA/NP/Med student/Med Resident etc but could involve care discussion time    Dr. Brand Males, M.D., [facility].C.P Pulmonary and Critical Care Medicine Staff Physician [facility] Pager: [phone], If no answer or between  15:00h - 7:00h: call [phone]  05/06/2016 9:16 AM

## 2016-05-06 NOTE — Progress Notes (Signed)
Nutrition Follow-up  INTERVENTION:   Continue Vital 1.2 @ 35 mL/hr with 30 mL Prostat BID. This regimen provides 1208 kcal (91% estimated kcal need), 93 grams of protein (100% minimum estimated protein need), and 681 mL free water.   - Free water flush, per MD/NP, if desired. - RD will continue to follow.  NUTRITION DIAGNOSIS:   Inadequate oral intake related to inability to eat as evidenced by NPO status.  Ongoing.  GOAL:   Patient will meet greater than or equal to 90% of their needs  Meeting with TF.  MONITOR:   TF tolerance, Vent status, Weight trends, Labs, I & O's  ASSESSMENT:   75 y.o. 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 The patient was in her usual state of health, got her Cytovene infusions last last Tuesday, and then over the last 3 or 4 days has developed progressive weakness and "just feeling awful". At first she thought she was just getting anemic again because of how tired she was over the weekend, but then tonight at 2AM she woke with fever to 102F, vomited, had worse malaise and chills and came to the ER.   She has had no dysuria or other urinary irritative symptoms.  She has had a new nonproductive cough this week.  No nose bleeds, bleeding from gums.  12/10: Patient continues to receive Vital AF 1.2 @ 35 ml/hr w/ 30 ml Prostat BID via OGT. No further BMs since 12/8. Current TF regimen now meets 91% of kcal needs.  Per chart review, pt is +14 lb since admission on 11/27.  Patient is currently intubated on ventilator support MV: 6.6 L/min Temp (24hrs), Avg:98.7 F (37.1 C), Min:97.9 F (36.6 C), Max:100.2 F (37.9 C)  Medications reviewed. Labs reviewed: CBGs: 131-144 Mg/Phos WNL  12/8: -Pt continues with OGT and is currently receiving Vital 1.2 @ 25 mL/hr with 30 mL Prostat BID. Spoke with RN who reports no noted signs of intolerance this shift and no report of this from night  shift. Confirmed with RN that pt had a "smear" BM early this AM and that she had no emesis overnight or this AM.  -Will change regimen as outlined above and communicated this with RN. Per chart review, weight +1.9 kg from yesterday and +9.8 kg from admission. Continue to use admission weight (61 kg) in estimating nutrition needs.  Diet Order:  Diet NPO time specified  Skin:  Reviewed, no issues  Last BM:  12/5; smear 12/8 AM  Height:   Ht Readings from Last 1 Encounters:  04/25/16 5\' 2"  (1.575 m)    Weight:   Wt Readings from Last 1 Encounters:  05/06/16 161 lb 9.6 oz (73.3 kg)    Ideal Body Weight:  50 kg  BMI:  Body mass index is 29.56 kg/m.  Estimated Nutritional Needs:   Kcal:  1333  Protein:  90-100g  Fluid:  1.5L/day  EDUCATION NEEDS:   No education needs identified at this time  Clayton Bibles, MS, RD, LDN Pager: [phone] After Hours Pager: [phone]

## 2016-05-07 ENCOUNTER — Inpatient Hospital Stay (HOSPITAL_COMMUNITY): Payer: Medicare Other

## 2016-05-07 LAB — BLOOD GAS, ARTERIAL
Acid-Base Excess: 8.4 mmol/L — ABNORMAL HIGH (ref 0.0–2.0)
Bicarbonate: 33.6 mmol/L — ABNORMAL HIGH (ref 20.0–28.0)
Drawn by: 331471
FIO2: 30
MODE: POSITIVE
O2 Saturation: 95.8 %
PEEP: 8 cmH2O
PH ART: 7.408 (ref 7.350–7.450)
Patient temperature: 98.6
Pressure support: 5 cmH2O
pCO2 arterial: 54.4 mmHg — ABNORMAL HIGH (ref 32.0–48.0)
pO2, Arterial: 79.3 mmHg — ABNORMAL LOW (ref 83.0–108.0)

## 2016-05-07 LAB — GLUCOSE, CAPILLARY
GLUCOSE-CAPILLARY: 151 mg/dL — AB (ref 65–99)
GLUCOSE-CAPILLARY: 127 mg/dL — AB (ref 65–99)
GLUCOSE-CAPILLARY: 125 mg/dL — AB (ref 65–99)
Glucose-Capillary: 139 mg/dL — ABNORMAL HIGH (ref 65–99)

## 2016-05-07 LAB — CBC WITH DIFFERENTIAL/PLATELET
BASOS ABS: 0 10*3/uL (ref 0.0–0.1)
BASOS PCT: 0 %
EOS ABS: 0 10*3/uL (ref 0.0–0.7)
Eosinophils Relative: 0 %
HEMATOCRIT: 26.1 % — AB (ref 36.0–46.0)
Hemoglobin: 8.6 g/dL — ABNORMAL LOW (ref 12.0–15.0)
LYMPHS ABS: 1.3 10*3/uL (ref 0.7–4.0)
LYMPHS PCT: 16 %
MCH: 31 pg (ref 26.0–34.0)
MCHC: 33 g/dL (ref 30.0–36.0)
MCV: 94.2 fL (ref 78.0–100.0)
Monocytes Absolute: 0.1 10*3/uL (ref 0.1–1.0)
Monocytes Relative: 1 %
NEUTROS ABS: 6.6 10*3/uL (ref 1.7–7.7)
Neutrophils Relative %: 83 %
Platelets: 196 10*3/uL (ref 150–400)
RBC: 2.77 MIL/uL — ABNORMAL LOW (ref 3.87–5.11)
RDW: 19.8 % — AB (ref 11.5–15.5)
WBC: 8 10*3/uL (ref 4.0–10.5)

## 2016-05-07 LAB — PHOSPHORUS: Phosphorus: 3.1 mg/dL (ref 2.5–4.6)

## 2016-05-07 LAB — TYPE AND SCREEN
ABO/RH(D): A POS
ANTIBODY SCREEN: NEGATIVE
UNIT DIVISION: 0

## 2016-05-07 LAB — MAGNESIUM: MAGNESIUM: 2 mg/dL (ref 1.7–2.4)

## 2016-05-07 NOTE — Progress Notes (Signed)
Per interventional radiology, Dr. Reesa Chew prefers that the patient be extubated prior to bone marrow biopsy. After relaying the information to Dr. Lake Bells and Dr. Marin Olp, The tentative plan is for Dr. Marin Olp to come do the procedure tomorrow morning at 0800. CCM madde aware.  Supplies ordered per MD request and at bedside: bone marrow biopsy tray, sterile towelsx4, box of 4x4 sterile gauze, sedation available per order.

## 2016-05-07 NOTE — Progress Notes (Signed)
PULMONARY / CRITICAL CARE MEDICINE   Name: Kristina Meyer MRN: [id number] DOB: 09/12/1940    ADMISSION DATE:  04/23/2016 CONSULTATION DATE:  04/24/16  REFERRING MD:  Dr Carolin Sicks, [place]: HCAP, neutropenic sepsis   LINES/TUBES: Port-s-cath >>  OETT 11/29>>> Left IJ CVL 11/29>>>   SIGNIFICANT EVENTS: 11/29 thora. Incomplete drainage of left pleural space 11/30 Left Chest tube placed.  12/4 Bronch 12/8  - Required IVF for low bp. Lasix held 12/9 - new Rt distal forearm redness x few days. Not on pressors. Failed SBT yesterday after 3h with agitation /desaturation /tacycardia. Currently on fent 50 and doing sbt and followng commands with calm. Fever coming down. Stopped anti-bacteriakl 12/10 - again failed sBT yesterdayafter few hours with taccycardia, tachypnea. WC up. Got low bp with lasix and givne albumin. 1 unit prbc this AM for hgb 6.9. Fever with nice downtred  SUBJECTIVE/OVERNIGHT/INTERVAL HX Agitated on SBT this morning   VITAL SIGNS: BP (!) 170/82 (BP Location: Left Arm)   Pulse (!) 103   Temp 98.2 F (36.8 C) (Axillary)   Resp (!) 24   Ht 5' 2"  (1.575 m)   Wt 158 lb 11.7 oz (72 kg)   SpO2 98%   BMI 29.03 kg/m   HEMODYNAMICS: CVP:  [9 mmHg-13 mmHg] 13 mmHg  VENTILATOR SETTINGS: Vent Mode: PSV FiO2 (%):  [30 %-40 %] 30 % Set Rate:  [16 bmp] 16 bmp Vt Set:  [300 mL] 300 mL PEEP:  [5 cmH20-8 cmH20] 8 cmH20 Pressure Support:  [10 cmH20] 10 cmH20 Plateau Pressure:  [13 cmH20-19 cmH20] 19 cmH20  INTAKE / OUTPUT: I/O last 3 completed shifts: In: 2882 [I.V.:914.1; Blood:60; NG/GT:1201.1; IV Piggyback:706.8] Out: 3275 [Urine:2975; Chest Tube:300]  PHYSICAL EXAMINATION: General: chronically ill appearing on vent HENT: NCAT, ETT PULM: Crackles R lung, vent supported breaths CV: RRR, no mgr GI: BS+, soft, nontender MSK: normal bulk and tone Derm: redness R forearm, external aspect Neuro: sedated on vent, makes spontaneous  movements  LABS: PULMONARY  Recent Labs Lab 05/04/16 0935  PHART 7.408  PCO2ART 54.4*  PO2ART 79.3*  HCO3 33.6*  O2SAT 95.8    CBC  Recent Labs Lab 05/05/16 0511 05/06/16 0444 05/07/16 0410  HGB 7.1* 6.9* 8.6*  HCT 22.1* 21.3* 26.1*  WBC 1.4* 3.2* 8.0  PLT 220 197 196    COAGULATION No results for input(s): INR in the last 168 hours.  CARDIAC  No results for input(s): TROPONINI in the last 168 hours. No results for input(s): PROBNP in the last 168 hours.   CHEMISTRY  Recent Labs Lab 05/03/16 0325 05/04/16 0308 05/04/16 1648 05/05/16 0511 05/06/16 0011 05/06/16 0444 05/07/16 0410  NA 139 140 138 141 140  --   --   K 3.3* 3.7 3.5 3.6 3.5  --   --   CL 100* 103 100* 101 99*  --   --   CO2 35* 33* 33* 36* 36*  --   --   GLUCOSE 137* 128* 135* 147* 141*  --   --   BUN 21* 24* 22* 22* 21*  --   --   CREATININE 0.47 0.37* 0.31* <0.30* 0.38*  --   --   CALCIUM 7.3* 7.6* 7.7* 7.8* 7.9*  --   --   MG 2.1 2.1  --  1.8  --  1.9 2.0  PHOS 3.2 3.3  --  3.0  --  2.7 3.1   Estimated Creatinine Clearance: 54.8 mL/min (by C-G formula based on  SCr of 0.38 mg/dL (L)).   LIVER  Recent Labs Lab 05/06/16 0444  AST 18  ALT 12*  ALKPHOS 97  BILITOT 1.0  PROT 4.6*  ALBUMIN 1.8*     INFECTIOUS No results for input(s): LATICACIDVEN, PROCALCITON in the last 168 hours.   ENDOCRINE CBG (last 3)   Recent Labs  05/06/16 2327 05/07/16 0343 05/07/16 0746  GLUCAP 168* 139* 151*     IMAGING  CXR images from 12/8-12/11 personally reviewed> there is a progressive left lung infiltrate which has worsened in the last 48 hours     STUDIES:  CT chest 11/28 >> No definite CT evidence for acute pulmonary embolus or aortic Dissection.  Moderate large left pleural effusion. Dense left lower lobe consolidation with partial consolidation in the lingula and right lower lobe which may represent pneumonia.  Mild mid esophageal thickening\  Echo 12/3 > EF 35%,  hypokinesis (diffuse)    ASSESSMENT / PLAN:  PULMONARY A: Acute respiratory failure with hypoxemia HCAP Complicated left pleural effusion, s/p drainage  P:   Wean vent with pressure support as able See ID VAP prevention protocol   CARDIOVASCULAR A:  Systolic CHE EF 14%, with acute pulm edema and diffuse hypokinesis/demand ischemia  P:  Diuresis daily, monitor BP Tele OK to use vasopressors to facilitate diuresis  RENAL  A:   AKI resolved  P:   Monitor BMET and UOP Replace electrolytes as needed   GASTROINTESTINAL A:   Stress ulcer prophylaxis Nutrition Vomiting on 12/3 P:   H2B for SUP Cont tube feeding.    HEMATOLOGIC / ONCOLOGIC A:   AML received treatment one week prior to admission Febrile Neutropenia Neutropenia resolved Thrombocytopenia, resolved Anemia s/p PRBC 11/27, 12/5   P Follow CBC + diff Bone marrow biopsy ordered 12/11  INFECTIOUS  CULTURES: Blood 11/27 >>  (-) Urine 11/27 >> insignificant growth Blood 11/28 >> (-) Urine 11/28 >> (-) Viral resp panel 11/28 >> neg Pleural fluid 11/29>>>no orgs  But abundant wbc BAL bacterial 12/4 >  BAL fungal 12/4 >  BAL afb12/4 >  BAL respiratory panel 12/4 > negative  A:   Worsening R Lung infiltrate 12/11, worrisome for worsening HCAP RUL and LLL HCAP Concern for Opportunistic infection given persistent neutropenia and AML and recent chemo  P:    ANTIBIOTICS: Cefepime 11/27 > 11/28 azithro 11/27 >> 12/2 vanco 11/28 >> 12/9 meropenem 11/28 >>12/9  anidulafungin 11/30>>>d/c'd, date? Acyclovir 12/1 >>  12/11 (stopped)  ENDOCRINE CBG (last 3)   Recent Labs  05/06/16 2327 05/07/16 0343 05/07/16 0746  GLUCAP 168* 139* 151*    A:   No hx DM P:   Follow glucose on BMP  NEUROLOGIC A:   Anxiety / depression Slowly waking up Sedation needs for vent synchrony  P:   RASS goal: -1 Fentanyl drip. PRN versed.  precedex gtt per PAD protocol   FAMILY  - Updates:  I  updated husband on 12/11.  Informed him that I want to see the results of a bone marrow biopsy prior to tracheostomy.  My cc time 40 minutes  Roselie Awkward, MD [place] [facility] Pager: [phone] Cell: [phone] After 3pm or if no response, call [phone]    05/07/2016 9:08 AM

## 2016-05-07 NOTE — Progress Notes (Signed)
Referring Physician(s): [id number]  Supervising Physician: Daryll Brod  Patient Status:  [facility] - In-pt  Chief Complaint:  Loculated left pleural effusion, AML  Subjective: Intubated, sedated   Allergies: Other  Medications: Prior to Admission medications   Medication Sig Start Date End Date Taking? Authorizing Provider  ALPRAZolam (XANAX) 0.5 MG tablet TAKE 1 TABLET BY MOUTH 3 TIMES A DAY AS NEEDED Patient taking differently: TAKE 1 TABLET BY MOUTH 3 TIMES A DAY AS NEEDED for anxiety. 03/26/16  Yes Volanda Napoleon, MD  aspirin 81 MG tablet Take 81 mg by mouth daily.    Yes Historical Provider, MD  atorvastatin (LIPITOR) 20 MG tablet Take 10 mg by mouth at bedtime.   Yes Historical Provider, MD  Calcium-Magnesium-Vitamin D (CALCIUM 1200+D3 PO) Take 1 tablet by mouth daily.   Yes Historical Provider, MD  cholecalciferol (VITAMIN D) 1000 UNITS tablet Take 1,000 Units by mouth daily.   Yes Historical Provider, MD  hydrochlorothiazide (HYDRODIURIL) 25 MG tablet Take by mouth. 12/08/16  Yes Historical Provider, MD  lidocaine-prilocaine (EMLA) cream Apply 1 application topically as needed. Place quarter sized amount of cream directly on portacath and cover with saran wrap at least 1 - 1 1/2 hours prior to procedure. 01/11/16  Yes Volanda Napoleon, MD  Multiple Vitamins-Minerals (MULTIVITAMIN ADULT) TABS Take 1 tablet by mouth daily.   Yes Historical Provider, MD  orphenadrine (NORFLEX) 100 MG tablet Take 1 tablet (100 mg total) by mouth at bedtime as needed for muscle spasms. 12/29/15  Yes Volanda Napoleon, MD  PARoxetine (PAXIL) 20 MG tablet Take 20 mg by mouth daily.   Yes Historical Provider, MD  potassium chloride SA (K-DUR,KLOR-CON) 20 MEQ tablet Take 1 pill twice a day for 7 days, then 1 pill a day. 02/13/16  Yes Volanda Napoleon, MD  prochlorperazine (COMPAZINE) 5 MG tablet Take 1 tablet (5 mg total) by mouth every 6 (six) hours as needed for nausea or vomiting. 01/05/16  Yes  Volanda Napoleon, MD  sulfamethoxazole-trimethoprim (BACTRIM DS) 800-160 MG per tablet Take 1 tablet by mouth daily.   Yes Historical Provider, MD     Vital Signs: BP (!) 121/39 (BP Location: Left Arm)   Pulse 71   Temp 98.5 F (36.9 C) (Oral)   Resp 19   Ht 5\' 2"  (1.575 m)   Wt 158 lb 11.7 oz (72 kg)   SpO2 100%   BMI 29.03 kg/m   Physical Exam intubated; left chest drain intact, output 300 cc yellow fluid; tube to wall suction; no obvious air leak  Imaging: Dg Abd 1 View  Result Date: 05/05/2016 CLINICAL DATA:  OG tube placement. EXAM: ABDOMEN - 1 VIEW COMPARISON:  None. FINDINGS: The side port appears to true preserved left upper quadrant, probably within the body of the stomach. A pigtail catheter overlies left hemidiaphragm. IMPRESSION: Location of the OG tube suggests a J-shaped stomach with the side port within the body and the distal tip in the distal stomach. Electronically Signed   By: Dorise Bullion III M.D   On: 05/05/2016 18:17   Dg Chest Port 1 View  Result Date: 05/07/2016 CLINICAL DATA:  Intubation. EXAM: PORTABLE CHEST 1 VIEW COMPARISON:  05/06/2016. FINDINGS: Left chest tube in stable position. Endotracheal tube, left IJ line, NG tube, PowerPort catheter in stable position. Heart size normal. Bilateral multifocal bilateral pulmonary infiltrates again noted IMPRESSION: 1. Lines and tubes including left chest tube in stable position. No pneumothorax. 2. Multifocal bilateral pulmonary  infiltrates with small right pleural effusion. Similar findings noted on prior exam Electronically Signed   By: Marcello Moores  Register   On: 05/07/2016 07:08   Dg Chest Port 1 View  Result Date: 05/06/2016 CLINICAL DATA:  Endotracheal tube position EXAM: PORTABLE CHEST 1 VIEW COMPARISON:  05/05/2016 FINDINGS: Endotracheal tube 3 cm above the carina. Left jugular central venous catheter tip in the SVC. Port-A-Cath tip in the SVC. Gastric tube enters the stomach. Pigtail catheter overlying the  left lung base is unchanged. A progression of bilateral airspace disease may most prominent in the right lung base. Progression of right pleural effusion. No pneumothorax. IMPRESSION: Endotracheal tube in satisfactory position Progression of diffuse bilateral airspace disease right greater than left. Probable pulmonary edema. Left pleural pigtail catheter in good position.  No pneumothorax. Electronically Signed   By: Franchot Gallo M.D.   On: 05/06/2016 06:41   Dg Chest Port 1 View  Result Date: 05/05/2016 CLINICAL DATA:  Follow-up examination for acute respiratory failure. EXAM: PORTABLE CHEST 1 VIEW COMPARISON:  Prior radiograph from 05/04/2016. FINDINGS: Right-sided Port-A-Cath in place, stable. Left IJ approach central venous catheter is unchanged with tip overlying the distal SVC. Enteric tube courses in the the abdomen. Side hole projects just distal to the GE junction. Pigtail drainage catheter in place over the left lung base. Cardiac and mediastinal silhouettes are stable in size and contour, and remain within normal limits. Aortic atherosclerosis noted. Lungs normally inflated. Veiling opacity overlying the right lung base favored to largely reflect effusion, similar to previous. Perihilar vascular congestion similar to previous. Patchy bibasilar opacities are slightly were the stent, with increased right upper lobe opacity as well. No pneumothorax. Osseous structures unchanged. IMPRESSION: 1. Slight interval worsening in bilateral pulmonary infiltrates as compared to 05/04/16. 2. Veiling opacity overlying the right lung base, compatible with pleural effusion. 3. Left-sided chest tube remains in place.  No pneumothorax. Electronically Signed   By: Jeannine Boga M.D.   On: 05/05/2016 06:09   Dg Chest Port 1 View  Result Date: 05/04/2016 CLINICAL DATA:  Acute respiratory failure. EXAM: PORTABLE CHEST 1 VIEW COMPARISON:  05/02/2016.  04/30/2016. FINDINGS: Endotracheal tube noted 1.7 cm above  the carina. PowerPort catheter and left IJ line in stable position. NG tube in stable position. PowerPort catheter in stable position. Left chest tube in stable position. No pneumothorax. Heart size stable. Persistent bilateral pulmonary infiltrates and/or edema. Small right pleural effusion. No pneumothorax . IMPRESSION: 1. Lines and tubes including left chest tube in stable position. No pneumothorax. 2. Distant bilateral pulmonary infiltrates and/or edema without interim change. Small right pleural effusion . Electronically Signed   By: Marcello Moores  Register   On: 05/04/2016 08:29    Labs:  CBC:  Recent Labs  05/04/16 0308 05/05/16 0511 05/06/16 0444 05/07/16 0410  WBC 0.9* 1.4* 3.2* 8.0  HGB 7.2* 7.1* 6.9* 8.6*  HCT 22.3* 22.1* 21.3* 26.1*  PLT 256 220 197 196    COAGS:  Recent Labs  01/11/16 1225  INR 1.20    BMP:  Recent Labs  05/04/16 0308 05/04/16 1648 05/05/16 0511 05/06/16 0011  NA 140 138 141 140  K 3.7 3.5 3.6 3.5  CL 103 100* 101 99*  CO2 33* 33* 36* 36*  GLUCOSE 128* 135* 147* 141*  BUN 24* 22* 22* 21*  CALCIUM 7.6* 7.7* 7.8* 7.9*  CREATININE 0.37* 0.31* <0.30* 0.38*  GFRNONAA >60 >60 NOT CALCULATED >60  GFRAA >60 >60 NOT CALCULATED >60    LIVER FUNCTION  TESTS:  Recent Labs  04/26/16 0349 04/27/16 0453 04/28/16 0416 05/06/16 0444  BILITOT 1.3* 1.5* 1.4* 1.0  AST 17 17 19 18   ALT 13* 12* 11* 12*  ALKPHOS 69 70 77 97  PROT 5.1* 4.9* 4.9* 4.6*  ALBUMIN 2.4* 2.0* 2.0* 1.8*    Assessment and Plan: S/p drainage of loculated left pleural effusion 12/1; AF; fluid cx's neg; CXR today with stable positioning of tubes, no ptx; hx prev tx'd AML -request received for CT guided BM bx for f/u eval - d/w Dr. Annamaria Boots- ideally pt should be extubated before BM bx due to need for prone positioning - nurse informed and will confer with Dr. Lake Bells   Electronically Signed: D. Rowe Robert 05/07/2016, 1:23 PM   I spent a total of 15 minutes at the the patient's  bedside AND on the patient's hospital floor or unit, greater than 50% of which was counseling/coordinating care for left chest drain    Patient ID: Kristina Meyer, female   DOB: 02/06/1941, 75 y.o.   MRN: [id number]

## 2016-05-07 NOTE — Progress Notes (Signed)
Date:  May 07, 2016 Chart reviewed for concurrent status and case management needs. Will continue to follow patient progress. Remains on full vent support slow weaning-poss. Trache planned then [facility] Discharge Planning: following for needs Expected discharge date: [id number] Velva Harman, [place], [place], [place]

## 2016-05-07 NOTE — Progress Notes (Signed)
Kristina Meyer white cell count has recovered very nicely now. Her white cell count today is 8.0. Her hemoglobin is 8.6. I think she got a unit of blood yesterday.  We will see about trying to get a bone marrow test set up for her this week. I will see if radiology can do this.  She is still on the vent. Her chest x-ray yesterday seemed to show some pulmonary edema. Her antibiotics are being tapered down. I think she still on acyclovir and Eraxis. She has negative fungal cultures. I think the Eraxis can probably be discontinued. Her temperature curve is coming down nicely. Again this correlates with her immune system coming back.  Hopefully, she will be able to be extubated soon.  On her physical exam, her temperature is 98.2. Pulse 75. Blood pressure 112/39. Oxygen saturations 100%. Her lungs have some bilateral rhonchi. I do not hear any wheezes. She has some decreased sounds at the bases. Cardiac exam regular rate and rhythm with no murmurs, rubs or bruits. Abdomen is soft. She has good bowel sounds. There is no fluid wave. There is no palpable liver or spleen tip. Extremities shows some improvement in the cellulitis in the right forearm. She may not have as much edema in her legs.  Hopefully, she will be able to be extubated. I know that she's been intubated for quite a while. I know she is getting tube feeds. They are at 35 mL an hour.  The bone marrow biopsy will definitely help [place] out as far as determining what, if any, treatment could be offered in the future. I would like to think that she has responded to date.  As always, the staff down the ICU has done a fantastic job caring for her.  Kristina Haw, MD  Kristina Meyer 11:6

## 2016-05-07 NOTE — Progress Notes (Addendum)
Nutrition Follow-up  DOCUMENTATION CODES:   Not applicable  INTERVENTION:  - Continue Vital 1.2 @ 35 mL/hr with 30 mL Prostat BID. This regimen provides 1208 kcal (92.5% estimated kcal need), 93 grams of protein (100% minimum estimated protein need), 4 grams of fiber, and 681 mL free water.  - Free water flush per MD/NP, if desired. - PEPuP protocol to be initiated at this time.  - RD will follow-up 12/12.  NUTRITION DIAGNOSIS:   Inadequate oral intake related to inability to eat as evidenced by NPO status. -ongoing  GOAL:   Patient will meet greater than or equal to 90% of their needs -met with current TF regimen.   MONITOR:   TF tolerance, Vent status, Weight trends, Labs, I & O's  ASSESSMENT:   75 y.o. 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 The patient was in her usual state of health, got her Cytovene infusions last last Tuesday, and then over the last 3 or 4 days has developed progressive weakness and "just feeling awful". At first she thought she was just getting anemic again because of how tired she was over the weekend, but then tonight at 2AM she woke with fever to 102F, vomited, had worse malaise and chills and came to the ER.   She has had no dysuria or other urinary irritative symptoms.  She has had a new nonproductive cough this week.  No nose bleeds, bleeding from gums.  12/11 Pt with OGT and continues with Vital 1.2 @ 35 mL/hr with 30 mL Prostat BID. Spoke with RN who confirms this regimen and confirms that pt has had no further BM since "smear" early AM on 12/8; will continue to monitor this closely. Current TF regimen is providing 4 grams of fiber. No free water flush ordered at this time and defer to CCM/PCCM as weight is +11 kg since admission.  Continue to use admission weight (61 kg) to estimate nutrition needs. Based on current estimated needs, goal for TF: Vital 1.2 @ 40 mL/hr with 30 mL  Prostat BID which will provide 1352 kcal, 102 grams of protein, and 778 mL free water. Although this is only 5 mL/hr greater than current rate, will postpone advancement until pt has a BM as current regimen is meeting >/=90% estimated kcal and protein needs. Dr. Antonieta Pert note this AM states that pt has a soft abdomen and good bowel sounds.   Patient is currently intubated on ventilator support MV: 7.3 L/min Temp (24hrs), Avg:98.5 F (36.9 C), Min:97.7 F (36.5 C), Max:99.7 F (37.6 C) BP:125/41 and MAP: 66.  Medications reviewed; 20 mg IV Pepcid BID, 40 mg IV Lasix x1 dose yesterday, sliding scale Novolog, 2 g IV Mg sulfate x1 dose yesterday, PRN IV Zofran, 40 mEq KCl per OGT x1 dose yesterday. Labs reviewed; CBGs: 139 and 151 mg/dL.  Drip: Precedex @ 1.2 mcg/kg/hr.  ADDENDUM: Per rounds this AM, pt to have bone marrow biopsy today which will likely determine POC.    12/10 - Patient continues to receive Vital AF 1.2 @ 35 ml/hr w/ 30 ml Prostat BID via OGT.  - No further BMs since 12/8. - Current TF regimen now meets 91% of kcal needs. - Per chart review, pt is +14 lb since admission on 11/27.  Patient is currently intubated on ventilator support MV: 6.6 L/min Temp (24hrs), Avg:98.7 F (37.1 C), Min:97.9 F (36.6 C), Max:100.2 F (37.9 C)    Diet  Order:  Diet NPO time specified  Skin:  Reviewed, no issues  Last BM:  12/5; smear 12/8 AM  Height:   Ht Readings from Last 1 Encounters:  04/25/16 _0  (1.575 m)    Weight:   Wt Readings from Last 1 Encounters:  05/07/16 158 lb 11.7 oz (72 kg)    Ideal Body Weight:  50 kg  BMI:  Body mass index is 29.03 kg/m.  Estimated Nutritional Needs:   Kcal:  1423  Protein:  90-100g  Fluid:  1.5L/day  EDUCATION NEEDS:   No education needs identified at this time    Jarome Matin, MS, RD, LDN, CNSC Inpatient Clinical Dietitian Pager # [phone] After hours/weekend pager # [phone]

## 2016-05-08 ENCOUNTER — Inpatient Hospital Stay (HOSPITAL_COMMUNITY): Payer: Medicare Other

## 2016-05-08 LAB — CBC WITH DIFFERENTIAL/PLATELET
BASOS PCT: 0 %
Basophils Absolute: 0 10*3/uL (ref 0.0–0.1)
EOS PCT: 0 %
Eosinophils Absolute: 0 10*3/uL (ref 0.0–0.7)
HEMATOCRIT: 25 % — AB (ref 36.0–46.0)
HEMOGLOBIN: 8.1 g/dL — AB (ref 12.0–15.0)
LYMPHS PCT: 22 %
Lymphs Abs: 1.8 10*3/uL (ref 0.7–4.0)
MCH: 30.8 pg (ref 26.0–34.0)
MCHC: 32.4 g/dL (ref 30.0–36.0)
MCV: 95.1 fL (ref 78.0–100.0)
MONOS PCT: 4 %
Monocytes Absolute: 0.3 10*3/uL (ref 0.1–1.0)
NEUTROS PCT: 74 %
Neutro Abs: 6 10*3/uL (ref 1.7–7.7)
Platelets: 184 10*3/uL (ref 150–400)
RBC: 2.63 MIL/uL — AB (ref 3.87–5.11)
RDW: 19.3 % — AB (ref 11.5–15.5)
WBC: 8.1 10*3/uL (ref 4.0–10.5)

## 2016-05-08 LAB — BASIC METABOLIC PANEL
ANION GAP: 5 (ref 5–15)
BUN: 19 mg/dL (ref 6–20)
CO2: 35 mmol/L — ABNORMAL HIGH (ref 22–32)
Calcium: 7.8 mg/dL — ABNORMAL LOW (ref 8.9–10.3)
Chloride: 100 mmol/L — ABNORMAL LOW (ref 101–111)
Creatinine, Ser: 0.36 mg/dL — ABNORMAL LOW (ref 0.44–1.00)
Glucose, Bld: 150 mg/dL — ABNORMAL HIGH (ref 65–99)
POTASSIUM: 3.1 mmol/L — AB (ref 3.5–5.1)
SODIUM: 140 mmol/L (ref 135–145)

## 2016-05-08 LAB — GLUCOSE, CAPILLARY
GLUCOSE-CAPILLARY: 145 mg/dL — AB (ref 65–99)
GLUCOSE-CAPILLARY: 131 mg/dL — AB (ref 65–99)
GLUCOSE-CAPILLARY: 111 mg/dL — AB (ref 65–99)
GLUCOSE-CAPILLARY: 108 mg/dL — AB (ref 65–99)
GLUCOSE-CAPILLARY: 129 mg/dL — AB (ref 65–99)
Glucose-Capillary: 146 mg/dL — ABNORMAL HIGH (ref 65–99)
Glucose-Capillary: 161 mg/dL — ABNORMAL HIGH (ref 65–99)
Glucose-Capillary: 119 mg/dL — ABNORMAL HIGH (ref 65–99)

## 2016-05-08 LAB — PHOSPHORUS: PHOSPHORUS: 3.6 mg/dL (ref 2.5–4.6)

## 2016-05-08 LAB — MAGNESIUM: Magnesium: 1.9 mg/dL (ref 1.7–2.4)

## 2016-05-08 LAB — BONE MARROW EXAM

## 2016-05-08 MED ORDER — FUROSEMIDE 10 MG/ML IJ SOLN
40.0000 mg | Freq: Once | INTRAMUSCULAR | Status: AC
  Administered 2016-05-08: 40 mg via INTRAVENOUS
  Filled 2016-05-08: qty 4

## 2016-05-08 MED ORDER — POTASSIUM CHLORIDE 20 MEQ/15ML (10%) PO SOLN
30.0000 meq | ORAL | Status: AC
  Administered 2016-05-08 (×2): 30 meq
  Filled 2016-05-08 (×2): qty 30

## 2016-05-08 MED ORDER — ADULT MULTIVITAMIN LIQUID CH
15.0000 mL | Freq: Every day | ORAL | Status: DC
  Administered 2016-05-08 – 2016-05-10 (×3): 15 mL
  Filled 2016-05-08 (×5): qty 15

## 2016-05-08 MED ORDER — VITAL AF 1.2 CAL PO LIQD
1000.0000 mL | ORAL | Status: DC
  Administered 2016-05-08 – 2016-05-09 (×2): 1000 mL
  Filled 2016-05-08 (×3): qty 1000

## 2016-05-08 NOTE — Progress Notes (Signed)
[facility] ADULT ICU REPLACEMENT PROTOCOL FOR AM LAB REPLACEMENT ONLY  The patient does apply for the [facility] Adult ICU Electrolyte Replacment Protocol based on the criteria listed below:   1. Is GFR >/= 40 ml/min? Yes.    Patient's GFR today is >60 2. Is urine output >/= 0.5 ml/kg/hr for the last 6 hours? Yes.   Patient's UOP is 1.03 ml/kg/hr 3. Is BUN < 60 mg/dL? Yes.    Patient's BUN today is 19 4. Abnormal electrolyte(s): K3.1 5. Ordered repletion with: Per protocol 6. If a panic level lab has been reported, has the CCM MD in charge been notified? Yes.  .   Physician:  Franco Collet, MD  Vear Clock 05/08/2016 5:14 AM

## 2016-05-08 NOTE — Progress Notes (Signed)
PULMONARY / CRITICAL CARE MEDICINE   Name: Kristina Meyer MRN: [id number] DOB: [birth year]    ADMISSION DATE:  04/23/2016 CONSULTATION DATE:  04/24/16  REFERRING MD:  Dr Carolin Sicks, TRH  REASON FOR CONSULT: HCAP, neutropenic sepsis  BRIEF SUMMARY: 75 y/o F with AML admitted with neutropenic fever, infiltrates.    SUBJECTIVE:  Afebrile, VSS.  WBC improved to 8.1, Hgb 8.1, platelets 184.  Bone marrow bx results pending.  Husband at bedside, anxious to hear results of this am's bone marrow biopsy.   VITAL SIGNS: BP (!) 133/59   Pulse 73   Temp 97.5 F (36.4 C) (Axillary)   Resp 18   Ht _0  (1.575 m)   Wt 157 lb 6.5 oz (71.4 kg)   SpO2 100%   BMI 28.79 kg/m   HEMODYNAMICS: CVP:  [6 mmHg-12 mmHg] 6 mmHg  VENTILATOR SETTINGS: Vent Mode: PSV FiO2 (%):  [30 %] 30 % Set Rate:  [16 bmp] 16 bmp Vt Set:  [300 mL] 300 mL PEEP:  [8 cmH20] 8 cmH20 Pressure Support:  [10 cmH20-12 cmH20] 12 cmH20 Plateau Pressure:  [9 cmH20-16 cmH20] 16 cmH20  INTAKE / OUTPUT: I/O last 3 completed shifts: In: 3458.1 [P.O.:805; I.V.:1094.7; NG/GT:1195; IV Piggyback:363.4] Out: 3070 [Urine:2525; Chest Tube:545]  PHYSICAL EXAMINATION: General: chronically ill appearing on vent HENT: NCAT, ETT, MM pink/moist PULM: non-labored, crackles R lung, vent supported breaths CV: RRR, no mgr GI: BS+, soft, nontender MSK: normal bulk and tone Derm: redness R forearm improved  Neuro: awake, nods / interacts appropriately, spontaneous movements   LABS: PULMONARY  Recent Labs Lab 05/04/16 0935  PHART 7.408  PCO2ART 54.4*  PO2ART 79.3*  HCO3 33.6*  O2SAT 95.8    CBC  Recent Labs Lab 05/06/16 0444 05/07/16 0410 05/08/16 0345  HGB 6.9* 8.6* 8.1*  HCT 21.3* 26.1* 25.0*  WBC 3.2* 8.0 8.1  PLT 197 196 184    COAGULATION No results for input(s): INR in the last 168 hours.  CARDIAC  No results for input(s): TROPONINI in the last 168 hours. No results for input(s): PROBNP in the last 168  hours.   CHEMISTRY  Recent Labs Lab 05/04/16 0308 05/04/16 1648 05/05/16 0511 05/06/16 0011 05/06/16 0444 05/07/16 0410 05/08/16 0345  NA 140 138 141 140  --   --  140  K 3.7 3.5 3.6 3.5  --   --  3.1*  CL 103 100* 101 99*  --   --  100*  CO2 33* 33* 36* 36*  --   --  35*  GLUCOSE 128* 135* 147* 141*  --   --  150*  BUN 24* 22* 22* 21*  --   --  19  CREATININE 0.37* 0.31* <0.30* 0.38*  --   --  0.36*  CALCIUM 7.6* 7.7* 7.8* 7.9*  --   --  7.8*  MG 2.1  --  1.8  --  1.9 2.0 1.9  PHOS 3.3  --  3.0  --  2.7 3.1 3.6   Estimated Creatinine Clearance: 54.5 mL/min (by C-G formula based on SCr of 0.36 mg/dL (L)).   LIVER  Recent Labs Lab 05/06/16 0444  AST 18  ALT 12*  ALKPHOS 97  BILITOT 1.0  PROT 4.6*  ALBUMIN 1.8*     INFECTIOUS No results for input(s): LATICACIDVEN, PROCALCITON in the last 168 hours.   ENDOCRINE CBG (last 3)   Recent Labs  05/07/16 2303 05/08/16 0332 05/08/16 0730  GLUCAP 131* 146* 111*     IMAGING  CXR 12/12 >> images personally reviewed, increased effusion on R, worsening of airspace disease, left chest tube/ETT/port in position  STUDIES:  CT chest 11/28 >> No definite CT evidence for acute pulmonary embolus or aortic dissection.  Moderate large left pleural effusion. Dense left lower lobe consolidation with partial consolidation in the lingula and right lower lobe which may represent pneumonia.  Mild mid esophageal thickening Echo 12/3 > EF 35%, hypokinesis (diffuse)  LINES/TUBES: Port-s-cath >>  OETT 11/29 >> Left IJ CVL 11/29 >>  SIGNIFICANT EVENTS: 11/29 - thora. Incomplete drainage of left pleural space 11/30 - Left Chest tube placed.  12/04 - Bronch 12/08 - Required IVF for low bp. Lasix held 12/09 - new Rt distal forearm redness x few days. Not on pressors. Failed SBT yesterday after 3h with agitation/desaturation /tacycardia. Currently on fent 50 and doing sbt and followng commands with calm. Fever coming down, abx  d/c'd  12/10 - failed SBT, WBC up. Got low bp with lasix and given albumin. 1 unit prbc this AM for hgb 6.9.   CULTURES: Blood 11/27 >>  (-) Urine 11/27 >> insignificant growth Blood 11/28 >> (-) Urine 11/28 >> (-) Viral resp panel 11/28 >> neg Pleural fluid 11/29>>>no orgs but abundant wbc BAL bacterial 12/4 >> negative BAL fungal 12/4 >> scant candida guilliermondii BAL afb12/4 >> BAL respiratory panel 12/4 > negative  ANTIBIOTICS: Cefepime 11/27 >> 11/28 azithro 11/27 >> 12/2 vanco 11/28 >> 12/9 meropenem 11/28 >> 12/9  anidulafungin 11/30 >> 12/11 Acyclovir 12/1 >> 12/11     ASSESSMENT / PLAN:  PULMONARY A: Acute respiratory failure with hypoxemia HCAP Complicated left pleural effusion, s/p CT drainage P:   PRVC overnight  Daily SBT as tolerated  Intermittent CXR See ID VAP prevention protocol Monitor CT site / hold removal until off positive pressure ventilation Consider bedside assessment of right pleural space  CARDIOVASCULAR A:  Systolic CHE EF 97%, with acute pulm edema and diffuse hypokinesis/demand ischemia Hypotension - resolved, diuresis + sedation P:  Monitor hemodynamics / tele  Lasix as below  OK to use vasopressors to facilitate diuresis  RENAL A:   AKI - resolved Hyponatremia  P:   Monitor BMET and UOP Replace electrolytes as needed Lasix 40 mg IV x1  GASTROINTESTINAL A:   Stress ulcer prophylaxis Nutrition Vomiting on 12/3 P:   H2B for SUP Cont tube feeding   HEMATOLOGIC / ONCOLOGIC A:   AML received treatment one week prior to admission Febrile Neutropenia Neutropenia resolved Thrombocytopenia, resolved Anemia s/p PRBC 11/27, 12/5 P: Follow CBC + diff Appreciate Dr. Marin Olp Bone marrow biopsy results pending ? If we should resume pharmacologic DVT prophylaxis  Continue SCD's   INFECTIOUS A:   Worsening R Lung infiltrate 12/11, worrisome for worsening HCAP / ? Layering effusion RUL and LLL HCAP Concern for  Opportunistic infection given persistent neutropenia and AML and recent chemo P:   ABX / cultures as above  Monitor fever curve / WBC  ENDOCRINE A:   Hyperglycemia  P:   Follow glucose on BMP  NEUROLOGIC A:   Anxiety / Depression Sedation needs for vent synchrony P:   RASS goal: -1 Fentanyl drip. PRN versed.  Precedex gtt per PAD protocol Daily SBT   FAMILY  - Updates:  Husband on 12/12 am > await bone marrow bx results for decision regarding tracheostomy  CC Time: 30 minutes  Noe Gens, NP-C [facility] Pgr: [phone] or if no answer [phone] 05/08/2016, 9:48 AM

## 2016-05-08 NOTE — Progress Notes (Signed)
Kristina Meyer is still on the ventilator. I think they're trying to wean her off but she is having a very difficult time being weaned off.  I talked her husband today. He does not want to keep her on the ventilator. He says she would not want to live like that.  I told him that we should see what the bone marrow test shows.  I did do a bone marrow biopsy on her. I did it from the left posterior iliac crest. I placed her on her right side. We got the consent signed by her husband. We did the timeout procedure at 8 AM. I infiltrated 5 mL of 1% lidocaine under the skin in the left posterior iliac crest region. I prepped and draped the procedure site initially. After the lidocaine infiltration, I then used a scalpel to make an incision into the skin. We obtained to excellent aspirates. This was done with the combination biopsy and aspirate needle. I then obtained a good bone marrow biopsy core. I cleaned and dressed the procedure site sterilely. A little pressure dressing was applied. She had no problems with the procedure.  Her labs look pretty stable. Her white cell count is 8.1. He was a 0.1. Platelet count 184K. Her creatinine is 0.36.  She is still getting tube feeds.  She has been afebrile. Again I think this is a sign that her immune system is coming back.  Her chest x-ray is worse. She has more "pneumonia" on the right side.  We will have to see what the bone marrow biopsy shows. Unfortunately, even if we see a good response, I'm not sure what else can be done for her. I would hate to see a trach put into her. I'm just surprised that she is not able to come off the ventilator. I am surprised that she has evidence of progressive pneumonia. I don't think that another bronchoscopy with washings and biopsy would make a difference.  I know from talking to her in the past that she would not want to be kept alive on the ventilator. That just does not have she would want to "exist". If her husband wants to  take her off, I cannot argue with this.  This is such a disappointing situation for me. I really had very optimistic inclinations with her. She seemed to respond pretty well to treatment with her blood counts normalizing. It just is very disheartening that she developed this pneumonia while neutropenic and the neutropenia, despite Neupogen, lasted longer than I would've thought.  Hopefully we will have some preliminary bone marrow results tomorrow.   I very much appreciate the help of the staff doing the bone marrow test today. They really made the procedure go incredibly smoothly. I think they also enjoyed listening to the Chrismas music that was playing while he did the procedure.  Lattie Haw, MD  Oswaldo Milian 25:1

## 2016-05-08 NOTE — Progress Notes (Signed)
Nutrition Follow-up  DOCUMENTATION CODES:   Not applicable  INTERVENTION:  - Will increase to goal rate TF: Vital 1.2 @ 40 mL/hr with 30 mL Prostat BID. This regimen will provide 1352 kcal, 102 grams of protein, and 778 mL free water. - PEPuP protocol. - Will order daily liquid multivitamin as goal TF rate still does not meet micronutrient needs. - Free water flush per MD/NP, if desired.  - RD will follow-up 12/13.  NUTRITION DIAGNOSIS:   Inadequate oral intake related to inability to eat as evidenced by NPO status. -ongoing  GOAL:   Patient will meet greater than or equal to 90% of their needs -met  MONITOR:   TF tolerance, Vent status, Weight trends, Labs, I & O's  ASSESSMENT:   75 y.o. 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 The patient was in her usual state of health, got her Cytovene infusions last last Tuesday, and then over the last 3 or 4 days has developed progressive weakness and "just feeling awful". At first she thought she was just getting anemic again because of how tired she was over the weekend, but then tonight at 2AM she woke with fever to 102F, vomited, had worse malaise and chills and came to the ER.   She has had no dysuria or other urinary irritative symptoms.  She has had a new nonproductive cough this week.  No nose bleeds, bleeding from gums.  12/12 Pt with OGT and continues with Vital 1.2 @ 35 mL/hr with 30 mL Prostat BID. Spoke with RN who reports that bone marrow biopsy was done early this AM. She states that pt had a large BM during biopsy; communicated plan to increase TF to goal rate at this time. Weight +10.4 kg since admission. Continue to use admission weight (61 kg) in estimating nutrition needs.  Patient is currently intubated on ventilator support MV: 8.3 L/min Temp (24hrs), Avg:98.5 F (36.9 C), Min:97.5 F (36.4 C), Max:99.6 F (37.6 C) BP: 136/52 and MAP:  78   Medications reviewed; 20 mg IV Pepcid BID, 40 mg IV Lasix x1 dose today, sliding scale Novolog, PRN IV Zofran, 30 mEq KCl per OGT x2 doses today.  Labs reviewed; CBGs: 111 and 146 mg/dL today, K: 3.1 mmol/L, Cl: 100 mmol/L, creatinine: 0.3 mg/dL, Ca: 7.8 mg/dL.   Drips: Fentanyl @ 25 mcg/hr, Precedex @ 1 mcg/kg/hr.     12/11 - Pt with OGT and continues with Vital 1.2 @ 35 mL/hr with 30 mL Prostat BID.  - Spoke with RN who confirms this regimen and confirms that pt has had no further BM since "smear" early AM on 12/8.  - Current TF regimen is providing 4 grams of fiber. No free water flush ordered at this time and defer to CCM/PCCM as weight is +11 kg since admission. - Continue to use admission weight (61 kg) to estimate nutrition needs.  - Based on current estimated needs, goal for TF: Vital 1.2 @ 40 mL/hr with 30 mL Prostat BID which will provide 1352 kcal, 102 grams of protein, and 778 mL free water.  - Although this is only 5 mL/hr greater than current rate, will postpone advancement until pt has a BM as current regimen is meeting >/=90% estimated kcal and protein needs.  - Dr. Antonieta Pert note this AM states that pt has a soft abdomen and good bowel sounds.  - Per rounds this AM, pt to have bone marrow biopsy  today which will likely determine POC.  Patient is currently intubated on ventilator support MV: 7.3 L/min Temp (24hrs), Avg:98.5 F (36.9 C), Min:97.7 F (36.5 C), Max:99.7 F (37.6 C) BP:125/41 and MAP: 66. Drip: Precedex @ 1.2 mcg/kg/hr.    12/10 - Patient continues to receive Vital AF 1.2 @ 35 ml/hr w/ 30 ml Prostat BID via OGT.  - No further BMs since 12/8. - Current TF regimen now meets 91% of kcal needs. - Per chart review, pt is +14 lb since admission on 11/27.  Patient is currently intubated on ventilator support MV: 6.6L/min Temp (24hrs), Avg:98.7 F (37.1 C), Min:97.9 F (36.6 C), Max:100.2 F (37.9 C)   Diet Order:  Diet NPO time  specified  Skin:  Reviewed, no issues  Last BM:  12/12  Height:   Ht Readings from Last 1 Encounters:  04/25/16 5' 2"  (1.575 m)    Weight:   Wt Readings from Last 1 Encounters:  05/08/16 157 lb 6.5 oz (71.4 kg)    Ideal Body Weight:  50 kg  BMI:  Body mass index is 28.79 kg/m.  Estimated Nutritional Needs:   Kcal:  1336  Protein:  90-100g  Fluid:  1.5L/day  EDUCATION NEEDS:   No education needs identified at this time    Jarome Matin, MS, RD, LDN, CNSC Inpatient Clinical Dietitian Pager # [phone] After hours/weekend pager # [phone]

## 2016-05-09 ENCOUNTER — Other Ambulatory Visit: Payer: Medicare Other

## 2016-05-09 ENCOUNTER — Inpatient Hospital Stay (HOSPITAL_COMMUNITY): Payer: Medicare Other

## 2016-05-09 DIAGNOSIS — J90 Pleural effusion, not elsewhere classified: Secondary | ICD-10-CM

## 2016-05-09 LAB — GLUCOSE, CAPILLARY
GLUCOSE-CAPILLARY: 132 mg/dL — AB (ref 65–99)
GLUCOSE-CAPILLARY: 149 mg/dL — AB (ref 65–99)
GLUCOSE-CAPILLARY: 184 mg/dL — AB (ref 65–99)
GLUCOSE-CAPILLARY: 103 mg/dL — AB (ref 65–99)
Glucose-Capillary: 137 mg/dL — ABNORMAL HIGH (ref 65–99)

## 2016-05-09 LAB — CBC WITH DIFFERENTIAL/PLATELET
Basophils Absolute: 0 10*3/uL (ref 0.0–0.1)
Basophils Relative: 0 %
EOS ABS: 0 10*3/uL (ref 0.0–0.7)
Eosinophils Relative: 0 %
HEMATOCRIT: 23.2 % — AB (ref 36.0–46.0)
HEMOGLOBIN: 7.6 g/dL — AB (ref 12.0–15.0)
LYMPHS ABS: 2 10*3/uL (ref 0.7–4.0)
LYMPHS PCT: 28 %
MCH: 30.3 pg (ref 26.0–34.0)
MCHC: 32.8 g/dL (ref 30.0–36.0)
MCV: 92.4 fL (ref 78.0–100.0)
MONOS PCT: 3 %
Monocytes Absolute: 0.2 10*3/uL (ref 0.1–1.0)
NEUTROS PCT: 69 %
Neutro Abs: 4.9 10*3/uL (ref 1.7–7.7)
Platelets: 174 10*3/uL (ref 150–400)
RBC: 2.51 MIL/uL — AB (ref 3.87–5.11)
RDW: 18.7 % — ABNORMAL HIGH (ref 11.5–15.5)
WBC: 7.1 10*3/uL (ref 4.0–10.5)

## 2016-05-09 LAB — BASIC METABOLIC PANEL
ANION GAP: 5 (ref 5–15)
BUN: 22 mg/dL — ABNORMAL HIGH (ref 6–20)
CALCIUM: 7.9 mg/dL — AB (ref 8.9–10.3)
CHLORIDE: 101 mmol/L (ref 101–111)
CO2: 33 mmol/L — AB (ref 22–32)
Creatinine, Ser: 0.37 mg/dL — ABNORMAL LOW (ref 0.44–1.00)
GFR calc non Af Amer: >60 mL/min (ref >60)
GLUCOSE: 147 mg/dL — AB (ref 65–99)
POTASSIUM: 3.2 mmol/L — AB (ref 3.5–5.1)
Sodium: 139 mmol/L (ref 135–145)

## 2016-05-09 LAB — PHOSPHORUS: Phosphorus: 3.7 mg/dL (ref 2.5–4.6)

## 2016-05-09 LAB — MAGNESIUM: MAGNESIUM: 1.8 mg/dL (ref 1.7–2.4)

## 2016-05-09 MED ORDER — POTASSIUM CHLORIDE 20 MEQ/15ML (10%) PO SOLN
30.0000 meq | ORAL | Status: AC
  Administered 2016-05-09 (×2): 30 meq
  Filled 2016-05-09 (×2): qty 30

## 2016-05-09 MED ORDER — FUROSEMIDE 10 MG/ML IJ SOLN
40.0000 mg | Freq: Once | INTRAMUSCULAR | Status: AC
  Administered 2016-05-09: 40 mg via INTRAVENOUS
  Filled 2016-05-09: qty 4

## 2016-05-09 NOTE — Progress Notes (Signed)
Patient ID: Kristina Meyer, female   DOB: Nov 11, 1940, 75 y.o.   MRN: [id number]    Referring Physician(s): Brand Males  Supervising Physician: Marybelle Killings  Patient Status: [facility] - In-pt  Chief Complaint: Loculated left pleural effusion  Subjective: Patient remains intubated, but is awake and nods her head to questions  Allergies: Other  Medications: Prior to Admission medications   Medication Sig Start Date End Date Taking? Authorizing Provider  ALPRAZolam (XANAX) 0.5 MG tablet TAKE 1 TABLET BY MOUTH 3 TIMES A DAY AS NEEDED Patient taking differently: TAKE 1 TABLET BY MOUTH 3 TIMES A DAY AS NEEDED for anxiety. 03/26/16  Yes Volanda Napoleon, MD  aspirin 81 MG tablet Take 81 mg by mouth daily.    Yes Historical Provider, MD  atorvastatin (LIPITOR) 20 MG tablet Take 10 mg by mouth at bedtime.   Yes Historical Provider, MD  Calcium-Magnesium-Vitamin D (CALCIUM 1200+D3 PO) Take 1 tablet by mouth daily.   Yes Historical Provider, MD  cholecalciferol (VITAMIN D) 1000 UNITS tablet Take 1,000 Units by mouth daily.   Yes Historical Provider, MD  hydrochlorothiazide (HYDRODIURIL) 25 MG tablet Take by mouth. 12/08/16  Yes Historical Provider, MD  lidocaine-prilocaine (EMLA) cream Apply 1 application topically as needed. Place quarter sized amount of cream directly on portacath and cover with saran wrap at least 1 - 1 1/2 hours prior to procedure. 01/11/16  Yes Volanda Napoleon, MD  Multiple Vitamins-Minerals (MULTIVITAMIN ADULT) TABS Take 1 tablet by mouth daily.   Yes Historical Provider, MD  orphenadrine (NORFLEX) 100 MG tablet Take 1 tablet (100 mg total) by mouth at bedtime as needed for muscle spasms. 12/29/15  Yes Volanda Napoleon, MD  PARoxetine (PAXIL) 20 MG tablet Take 20 mg by mouth daily.   Yes Historical Provider, MD  potassium chloride SA (K-DUR,KLOR-CON) 20 MEQ tablet Take 1 pill twice a day for 7 days, then 1 pill a day. 02/13/16  Yes Volanda Napoleon, MD  prochlorperazine (COMPAZINE) 5 MG  tablet Take 1 tablet (5 mg total) by mouth every 6 (six) hours as needed for nausea or vomiting. 01/05/16  Yes Volanda Napoleon, MD  sulfamethoxazole-trimethoprim (BACTRIM DS) 800-160 MG per tablet Take 1 tablet by mouth daily.   Yes Historical Provider, MD    Vital Signs: BP (!) 134/57   Pulse 76   Temp 97.7 F (36.5 C) (Oral)   Resp (!) 22   Ht 5\' 2"  (1.575 m)   Wt 155 lb 10.3 oz (70.6 kg)   SpO2 100%   BMI 28.47 kg/m   Physical Exam: Lungs: CT in place in left posterior lung.  Serous drainage noted in cannister 245cc in 24hrs.  Imaging: Dg Abd 1 View  Result Date: 05/05/2016 CLINICAL DATA:  OG tube placement. EXAM: ABDOMEN - 1 VIEW COMPARISON:  None. FINDINGS: The side port appears to true preserved left upper quadrant, probably within the body of the stomach. A pigtail catheter overlies left hemidiaphragm. IMPRESSION: Location of the OG tube suggests a J-shaped stomach with the side port within the body and the distal tip in the distal stomach. Electronically Signed   By: Dorise Bullion III M.D   On: 05/05/2016 18:17   Dg Chest Port 1 View  Result Date: 05/08/2016 CLINICAL DATA:  Neutropenic sepsis.  Acute respiratory failure. EXAM: PORTABLE CHEST 1 VIEW COMPARISON:  Single-view of the chest 05/07/2016 and 05/06/2016. FINDINGS: Support tubes and lines including a pigtail catheter in the left chest are unchanged. Right effusion and  airspace disease have worsened since the prior examinations. Airspace opacity in the left lower lung zone is unchanged. Heart size is normal. No pneumothorax. IMPRESSION: Worsened right effusion and airspace disease compatible with progressive pneumonia. No change in left basilar airspace disease. Negative for pneumothorax with a left chest tube in place. Electronically Signed   By: Inge Rise M.D.   On: 05/08/2016 07:51   Dg Chest Port 1 View  Result Date: 05/07/2016 CLINICAL DATA:  Intubation. EXAM: PORTABLE CHEST 1 VIEW COMPARISON:  05/06/2016.  FINDINGS: Left chest tube in stable position. Endotracheal tube, left IJ line, NG tube, PowerPort catheter in stable position. Heart size normal. Bilateral multifocal bilateral pulmonary infiltrates again noted IMPRESSION: 1. Lines and tubes including left chest tube in stable position. No pneumothorax. 2. Multifocal bilateral pulmonary infiltrates with small right pleural effusion. Similar findings noted on prior exam Electronically Signed   By: Marcello Moores  Register   On: 05/07/2016 07:08   Dg Chest Port 1 View  Result Date: 05/06/2016 CLINICAL DATA:  Endotracheal tube position EXAM: PORTABLE CHEST 1 VIEW COMPARISON:  05/05/2016 FINDINGS: Endotracheal tube 3 cm above the carina. Left jugular central venous catheter tip in the SVC. Port-A-Cath tip in the SVC. Gastric tube enters the stomach. Pigtail catheter overlying the left lung base is unchanged. A progression of bilateral airspace disease may most prominent in the right lung base. Progression of right pleural effusion. No pneumothorax. IMPRESSION: Endotracheal tube in satisfactory position Progression of diffuse bilateral airspace disease right greater than left. Probable pulmonary edema. Left pleural pigtail catheter in good position.  No pneumothorax. Electronically Signed   By: Franchot Gallo M.D.   On: 05/06/2016 06:41    Labs:  CBC:  Recent Labs  05/06/16 0444 05/07/16 0410 05/08/16 0345 05/09/16 0337  WBC 3.2* 8.0 8.1 7.1  HGB 6.9* 8.6* 8.1* 7.6*  HCT 21.3* 26.1* 25.0* 23.2*  PLT 197 196 184 174    COAGS:  Recent Labs  01/11/16 1225  INR 1.20    BMP:  Recent Labs  05/05/16 0511 05/06/16 0011 05/08/16 0345 05/09/16 0337  NA 141 140 140 139  K 3.6 3.5 3.1* 3.2*  CL 101 99* 100* 101  CO2 36* 36* 35* 33*  GLUCOSE 147* 141* 150* 147*  BUN 22* 21* 19 22*  CALCIUM 7.8* 7.9* 7.8* 7.9*  CREATININE <0.30* 0.38* 0.36* 0.37*  GFRNONAA NOT CALCULATED >60 >60 >60  GFRAA NOT CALCULATED >60 >60 >60    LIVER FUNCTION  TESTS:  Recent Labs  04/26/16 0349 04/27/16 0453 04/28/16 0416 05/06/16 0444  BILITOT 1.3* 1.5* 1.4* 1.0  AST 17 17 19 18   ALT 13* 12* 11* 12*  ALKPHOS 69 70 77 97  PROT 5.1* 4.9* 4.9* 4.6*  ALBUMIN 2.4* 2.0* 2.0* 1.8*    Assessment and Plan: 1. S/p placement of left chest tube on 12/1 for loculated left pleural effusion -continues drain placement per CCM until patient is extubated. -drain in good position with good output. -will follow.  Electronically Signed: Henreitta Cea 05/09/2016, 1:23 PM   I spent a total of 15 Minutes at the the patient's bedside AND on the patient's hospital floor or unit, greater than 50% of which was counseling/coordinating care for loculated left pleural effusion

## 2016-05-09 NOTE — Progress Notes (Signed)
Nutrition Follow-up  DOCUMENTATION CODES:   Not applicable  INTERVENTION:  - Continue Vital 1.2 @ 40 mL/hr with 30 mL Prostat BID. - Continue PEPuP protocol. - Free water flush per MD/NP, if desired. - RD will follow-up 12/14.  NUTRITION DIAGNOSIS:   Inadequate oral intake related to inability to eat as evidenced by NPO status. -ongoing  GOAL:   Patient will meet greater than or equal to 90% of their needs -met with current TF regimen.   MONITOR:   TF tolerance, Vent status, Weight trends, Labs, I & O's  ASSESSMENT:   75 y.o. 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 The patient was in her usual state of health, got her Cytovene infusions last last Tuesday, and then over the last 3 or 4 days has developed progressive weakness and "just feeling awful". At first she thought she was just getting anemic again because of how tired she was over the weekend, but then tonight at 2AM she woke with fever to 102F, vomited, had worse malaise and chills and came to the ER.   She has had no dysuria or other urinary irritative symptoms.  She has had a new nonproductive cough this week.  No nose bleeds, bleeding from gums.  12/13 Pt continues with OGT and is currently receiving TF at goal rate: Vital 1.2 @ 40 mL/hr with 30 mL Prostat BID. This regimen is providing 1352 kcal (106% re-estimated kcal need), 102 grams of protein, and 778 mL free water. Pt more alert this AM and able to communicate with pointing and nodding/shaking head. Spoke with pt's husband, who is at bedside. Estimated nutrition needs updated this AM and now based on weight later in the day of admission (66.8 kg) as this is more consistent with weight trends since admission than previously used weight (61 kg) which has now been an outlier since admission; estimated needs not overly significantly different between these two values.   PCCM NP note this AM, pt is  likely not a good candidate for trach placement. Bone marrow biopsy results still pending.  Patient is currently intubated on ventilator support MV: 8.9 L/min Temp (24hrs), Avg:97.2 F (36.2 C), Min:96.5 F (35.8 C), Max:98.3 F (36.8 C) BP: 149/68 and MAP: 96.  Medications reviewed; 20 mg IV Pepcid BID, 40 mg IV Lasix x1 dose today, sliding scale Novolog, 15 mL liquid multivitamin per OGT/day, PRN IV Zofran, 30 mEq KCl per OGT x2 doses today.  Labs reviewed; CBGs: 132 and 137 mg/dL this AM, K: 3.2 mmol/L, BUN: 22 mg/dL, creatinine: 0.37 mg/dL, Ca: 7.9 mg/dL.   Drips: Fentanyl @ 25 mcg/hr and Precedex @ 1 mcg/kg/hr.    12/12 - Pt with OGT and continues with Vital 1.2 @ 35 mL/hr with 30 mL Prostat BID.  - Spoke with RN who reports that bone marrow biopsy was done early this AM.  - She states that pt had a large BM during biopsy; communicated plan to increase TF to goal rate at this time.  - Weight +10.4 kg since admission.  - Continue to use admission weight (61 kg) in estimating nutrition needs.  Patient is currently intubated on ventilator support MV: 8.3 L/min Temp (24hrs), Avg:98.5 F (36.9 C), Min:97.5 F (36.4 C), Max:99.6 F (37.6 C) BP: 136/52 and MAP: 78 Drips: Fentanyl @ 25 mcg/hr, Precedex @ 1 mcg/kg/hr.     12/11 - Pt with OGT and continues with Vital 1.2 @ 35  mL/hr with 30 mL Prostat BID.  - Spoke with RN who confirms this regimen and confirms that pt has had no further BM since "smear" early AM on 12/8.  - Current TF regimen is providing 4 grams of fiber. No free water flush ordered at this time and defer to CCM/PCCM as weight is +11 kg since admission. - Continue to use admission weight (61 kg) to estimate nutrition needs.  - Based on current estimated needs, goal for TF: Vital 1.2 @ 40 mL/hr with 30 mL Prostat BIDwhich will provide 1352 kcal, 102 grams of protein, and 778 mL free water.  - Although this is only 5 mL/hr greater than current rate, will  postpone advancement until pt has a BM as current regimen is meeting >/=90% estimated kcal and protein needs.  - Dr. Antonieta Pert note this AM states that pt has a soft abdomen and good bowel sounds.  - Per rounds this AM, pt to have bone marrow biopsy today which will likely determine POC.  Patient is currently intubated on ventilator support MV: 7.3L/min Temp (24hrs), Avg:98.5 F (36.9 C), Min:97.7 F (36.5 C), Max:99.7 F (37.6 C) BP:125/41 and MAP: 66. Drip: Precedex @ 1.2 mcg/kg/hr.    Diet Order:  Diet NPO time specified  Skin:  Reviewed, no issues  Last BM:  12/13  Height:   Ht Readings from Last 1 Encounters:  04/25/16 5' 2"  (1.575 m)    Weight:   Wt Readings from Last 1 Encounters:  05/09/16 155 lb 10.3 oz (70.6 kg)    Ideal Body Weight:  50 kg  BMI:  Body mass index is 28.47 kg/m.  Estimated Nutritional Needs:   Kcal:  1277  Protein:  80-100 grams (1.2-1.5 grams/kg)  Fluid:  1.5L/day  EDUCATION NEEDS:   No education needs identified at this time    Jarome Matin, MS, RD, LDN, CNSC Inpatient Clinical Dietitian Pager # [phone] After hours/weekend pager # [phone]

## 2016-05-09 NOTE — Progress Notes (Signed)
[facility] ADULT ICU REPLACEMENT PROTOCOL FOR AM LAB REPLACEMENT ONLY  The patient does not apply for the [facility] Adult ICU Electrolyte Replacment Protocol based on the criteria listed below:   *  Is urine output >/= 0.5 ml/kg/hr for the last 6 hours? No. Patient's UOP is 0.4 ml/kg/hr ** Abnormal electrolyte(s): K3.2, Mg1.8  If a panic level lab has been reported, has the CCM MD in charge been notified? Yes.   Physician:  Elder Cyphers, MD  Vear Clock 05/09/2016 5:31 AM

## 2016-05-09 NOTE — Progress Notes (Signed)
PULMONARY / CRITICAL CARE MEDICINE   Name: Kristina Meyer MRN: [id number] DOB: 25-Nov-1940    ADMISSION DATE:  04/23/2016 CONSULTATION DATE:  04/24/16  REFERRING MD:  Dr Carolin Sicks, TR[facility]  REASON FOR CONSULT: HCAP, neutropenic sepsis  BRIEF SUMMARY: 75 y/o F with AML admitted with neutropenic fever, infiltrates.    SUBJECTIVE:  Afebrile, VSS.  Bone marrow results pending.  RN reports no acute events overnight.  Net neg 845 ml in last 24 hours  VITAL SIGNS: BP (!) 149/49   Pulse 79   Temp 97.3 F (36.3 C) (Oral)   Resp 14   Ht _0  (1.575 m)   Wt 155 lb 10.3 oz (70.6 kg)   SpO2 97%   BMI 28.47 kg/m   HEMODYNAMICS: CVP:  [5 mmHg-19 mmHg] 7 mmHg  VENTILATOR SETTINGS: Vent Mode: PRVC FiO2 (%):  [30 %] 30 % Set Rate:  [16 bmp] 16 bmp Vt Set:  [300 mL] 300 mL PEEP:  [8 cmH20] 8 cmH20 Pressure Support:  [12 cmH20] 12 cmH20 Plateau Pressure:  [13 cmH20-15 cmH20] 15 cmH20  INTAKE / OUTPUT: I/O last 3 completed shifts: In: 2741.1 [I.V.:1010.4; NG/GT:1630.8; IV Piggyback:100] Out: 3135 [Urine:3075; Chest Tube:60]  PHYSICAL EXAMINATION: General: chronically ill appearing on vent HENT: NCAT, ETT, MM pink/moist PULM: non-labored, diminished R lower, vent supported breaths CV: RRR, no mgr GI: BS+, soft, nontender MSK: normal bulk and tone Derm: redness R forearm improved  Neuro: sedate, arouses/appropriate, generalized weakness    LABS: PULMONARY  Recent Labs Lab 05/04/16 0935  PHART 7.408  PCO2ART 54.4*  PO2ART 79.3*  HCO3 33.6*  O2SAT 95.8    CBC  Recent Labs Lab 05/07/16 0410 05/08/16 0345 05/09/16 0337  HGB 8.6* 8.1* 7.6*  HCT 26.1* 25.0* 23.2*  WBC 8.0 8.1 7.1  PLT 196 184 174    COAGULATION No results for input(s): INR in the last 168 hours.  CARDIAC  No results for input(s): TROPONINI in the last 168 hours. No results for input(s): PROBNP in the last 168 hours.   CHEMISTRY  Recent Labs Lab 05/04/16 1648 05/05/16 0511 05/06/16 0011  05/06/16 0444 05/07/16 0410 05/08/16 0345 05/09/16 0337  NA 138 141 140  --   --  140 139  K 3.5 3.6 3.5  --   --  3.1* 3.2*  CL 100* 101 99*  --   --  100* 101  CO2 33* 36* 36*  --   --  35* 33*  GLUCOSE 135* 147* 141*  --   --  150* 147*  BUN 22* 22* 21*  --   --  19 22*  CREATININE 0.31* <0.30* 0.38*  --   --  0.36* 0.37*  CALCIUM 7.7* 7.8* 7.9*  --   --  7.8* 7.9*  MG  --  1.8  --  1.9 2.0 1.9 1.8  PHOS  --  3.0  --  2.7 3.1 3.6 3.7   Estimated Creatinine Clearance: 54.2 mL/min (by C-G formula based on SCr of 0.37 mg/dL (L)).   LIVER  Recent Labs Lab 05/06/16 0444  AST 18  ALT 12*  ALKPHOS 97  BILITOT 1.0  PROT 4.6*  ALBUMIN 1.8*     INFECTIOUS No results for input(s): LATICACIDVEN, PROCALCITON in the last 168 hours.   ENDOCRINE CBG (last 3)   Recent Labs  05/08/16 2318 05/09/16 0323 05/09/16 0806  GLUCAP 119* 137* 132*     IMAGING  CXR 12/12 >> images personally reviewed, increased effusion on R, worsening of  airspace disease, left chest tube/ETT/port in position  STUDIES:  CT chest 11/28 >> No definite CT evidence for acute pulmonary embolus or aortic dissection.  Moderate large left pleural effusion. Dense left lower lobe consolidation with partial consolidation in the lingula and right lower lobe which may represent pneumonia.  Mild mid esophageal thickening Echo 12/3 > EF 35%, hypokinesis (diffuse)  LINES/TUBES: Port-s-cath >>  OETT 11/29 >> Left IJ CVL 11/29 >>  SIGNIFICANT EVENTS: 11/29 - thora. Incomplete drainage of left pleural space 11/30 - Left Chest tube placed.  12/04 - Bronch 12/08 - Required IVF for low bp. Lasix held 12/09 - new Rt distal forearm redness x few days. Not on pressors. Failed SBT yesterday after 3h with agitation/desaturation /tacycardia. Currently on fent 50 and doing sbt and followng commands with calm. Fever coming down, abx d/c'd  12/10 - failed SBT, WBC up. Got low bp with lasix and given albumin. 1 unit prbc  this AM for hgb 6.9.  12/12 - weaned on 12/5  CULTURES: Blood 11/27 >>  (-) Urine 11/27 >> insignificant growth Blood 11/28 >> (-) Urine 11/28 >> (-) Viral resp panel 11/28 >> neg Pleural fluid 11/29>>>no orgs but abundant wbc BAL bacterial 12/4 >> negative BAL fungal 12/4 >> scant candida guilliermondii BAL afb12/4 >> BAL respiratory panel 12/4 > negative  ANTIBIOTICS: Cefepime 11/27 >> 11/28 azithro 11/27 >> 12/2 vanco 11/28 >> 12/9 meropenem 11/28 >> 12/9  anidulafungin 11/30 >> 12/11 Acyclovir 12/1 >> 12/11     ASSESSMENT / PLAN:  PULMONARY A: Acute respiratory failure with hypoxemia HCAP Complicated left pleural effusion, s/p CT drainage P:   PRVC overnight  Daily SBT as tolerated  Intermittent CXR See ID VAP prevention protocol Monitor CT site / hold removal until off positive pressure ventilation Consider bedside assessment of right pleural space  Do not feel she would be a good candidate for tracheostomy   CARDIOVASCULAR A:  Systolic CHE EF 78%, with acute pulm edema and diffuse hypokinesis/demand ischemia Hypotension - resolved, diuresis + sedation P:  Monitor hemodynamics / tele  Lasix 40 mg IV x1  RENAL A:   AKI - resolved Hyponatremia  Hypokalemia P:   Monitor BMET and UOP Replace electrolytes as needed Lasix 40 mg IV x1  GASTROINTESTINAL A:   Stress ulcer prophylaxis Nutrition Vomiting on 12/3 P:   H2B for SUP Cont tube feeding  HEMATOLOGIC / ONCOLOGIC A:   AML received treatment one week prior to admission Febrile Neutropenia Neutropenia resolved Thrombocytopenia, resolved Anemia s/p PRBC 11/27, 12/5 P: Follow CBC + diff Appreciate Dr. Marin Olp Bone marrow biopsy results pending ? If we should resume pharmacologic DVT prophylaxis  Continue SCD's   INFECTIOUS A:   Worsening R Lung infiltrate 12/11, worrisome for worsening HCAP / ? Layering effusion RUL and LLL HCAP Concern for Opportunistic infection given persistent  neutropenia and AML and recent chemo P:   ABX / cultures as above  Monitor fever curve / WBC  ENDOCRINE A:   Hyperglycemia  P:   Follow glucose on BMP  NEUROLOGIC A:   Anxiety / Depression Sedation needs for vent synchrony P:   RASS goal: -1 Fentanyl drip. PRN versed.  Precedex gtt per PAD protocol Daily SBT/WUA   FAMILY  - Updates:  Husband on 12/13 am > await bone marrow bx results.  CC Time: 30 minutes  Noe Gens, NP-C [facility] Pgr: [phone] or if no answer [phone] 05/09/2016, 9:06 AM

## 2016-05-09 NOTE — Progress Notes (Signed)
[place] Progress Note Patient Name: Kristina Meyer DOB: 1941/01/09 MRN: [id number]   Date of Service  05/09/2016  HPI/Events of Note  Patient pulled out chest tube. Sat = 99%.   eICU Interventions  Will order: 1. Portable CXR STAT. 2. Will ask APP to assess the patient at bedside.      Intervention Category Intermediate Interventions: Respiratory distress - evaluation and management  Rafiel Mecca Eugene 05/09/2016, 9:21 PM

## 2016-05-09 NOTE — Progress Notes (Signed)
Upon assessment Nurse found tubing from chest tube lying next to patient. Petroleum gauze was applied with guaze and taped securely over entry site. Patient not in respiratory distress with O2 saturation remaining >98%. E-link called and xray ordered. Upon x-ray results it was determined that chest tube did not need reinsertion. Nurse will continue to monitor closely.

## 2016-05-10 ENCOUNTER — Inpatient Hospital Stay (HOSPITAL_COMMUNITY): Payer: Medicare Other

## 2016-05-10 DIAGNOSIS — C9301 Acute monoblastic/monocytic leukemia, in remission: Secondary | ICD-10-CM

## 2016-05-10 LAB — BASIC METABOLIC PANEL
ANION GAP: 4 — AB (ref 5–15)
BUN: 21 mg/dL — ABNORMAL HIGH (ref 6–20)
CALCIUM: 8.2 mg/dL — AB (ref 8.9–10.3)
CO2: 35 mmol/L — ABNORMAL HIGH (ref 22–32)
Chloride: 101 mmol/L (ref 101–111)
Creatinine, Ser: 0.35 mg/dL — ABNORMAL LOW (ref 0.44–1.00)
GLUCOSE: 126 mg/dL — AB (ref 65–99)
POTASSIUM: 3.2 mmol/L — AB (ref 3.5–5.1)
SODIUM: 140 mmol/L (ref 135–145)

## 2016-05-10 LAB — GLUCOSE, CAPILLARY
GLUCOSE-CAPILLARY: 121 mg/dL — AB (ref 65–99)
GLUCOSE-CAPILLARY: 133 mg/dL — AB (ref 65–99)
GLUCOSE-CAPILLARY: 98 mg/dL (ref 65–99)
Glucose-Capillary: 150 mg/dL — ABNORMAL HIGH (ref 65–99)
Glucose-Capillary: 89 mg/dL (ref 65–99)
Glucose-Capillary: 114 mg/dL — ABNORMAL HIGH (ref 65–99)

## 2016-05-10 LAB — CBC
HCT: 23.3 % — ABNORMAL LOW (ref 36.0–46.0)
HEMOGLOBIN: 7.7 g/dL — AB (ref 12.0–15.0)
MCH: 31 pg (ref 26.0–34.0)
MCHC: 33 g/dL (ref 30.0–36.0)
MCV: 94 fL (ref 78.0–100.0)
PLATELETS: 197 10*3/uL (ref 150–400)
RBC: 2.48 MIL/uL — AB (ref 3.87–5.11)
RDW: 18.8 % — ABNORMAL HIGH (ref 11.5–15.5)
WBC: 9.9 10*3/uL (ref 4.0–10.5)

## 2016-05-10 LAB — BODY FLUID CELL COUNT WITH DIFFERENTIAL
Lymphs, Fluid: 23 %
Monocyte-Macrophage-Serous Fluid: 8 % — ABNORMAL LOW (ref 50–90)
Neutrophil Count, Fluid: 69 % — ABNORMAL HIGH (ref 0–25)
WBC FLUID: 126 uL (ref 0–1000)

## 2016-05-10 LAB — LACTATE DEHYDROGENASE, PLEURAL OR PERITONEAL FLUID: LD FL: 85 U/L — AB (ref 3–23)

## 2016-05-10 LAB — HEMOGLOBIN AND HEMATOCRIT, BLOOD
HCT: 32 % — ABNORMAL LOW (ref 36.0–46.0)
HEMOGLOBIN: 10.4 g/dL — AB (ref 12.0–15.0)

## 2016-05-10 LAB — MAGNESIUM: MAGNESIUM: 1.8 mg/dL (ref 1.7–2.4)

## 2016-05-10 LAB — PROTEIN, BODY FLUID: Total protein, fluid: <3 g/dL

## 2016-05-10 LAB — LACTATE DEHYDROGENASE: LDH: 227 U/L — ABNORMAL HIGH (ref 98–192)

## 2016-05-10 LAB — PREPARE RBC (CROSSMATCH)

## 2016-05-10 LAB — PROTEIN, TOTAL: Total Protein: 5.6 g/dL — ABNORMAL LOW (ref 6.5–8.1)

## 2016-05-10 MED ORDER — SODIUM CHLORIDE 0.9 % IV SOLN
30.0000 meq | Freq: Once | INTRAVENOUS | Status: AC
  Administered 2016-05-10: 30 meq via INTRAVENOUS
  Filled 2016-05-10: qty 15

## 2016-05-10 MED ORDER — FENTANYL CITRATE (PF) 100 MCG/2ML IJ SOLN
25.0000 ug | INTRAMUSCULAR | Status: DC | PRN
  Administered 2016-05-10: 25 ug via INTRAVENOUS
  Administered 2016-05-10: 50 ug via INTRAVENOUS
  Administered 2016-05-10: 100 ug via INTRAVENOUS
  Filled 2016-05-10 (×4): qty 2

## 2016-05-10 MED ORDER — SODIUM CHLORIDE 0.9 % IV SOLN
Freq: Once | INTRAVENOUS | Status: AC
  Administered 2016-05-10: 10:00:00 via INTRAVENOUS

## 2016-05-10 MED ORDER — PHENOL 1.4 % MT LIQD
1.0000 | OROMUCOSAL | Status: DC | PRN
  Administered 2016-05-10: 1 via OROMUCOSAL
  Filled 2016-05-10: qty 177

## 2016-05-10 MED ORDER — POTASSIUM CHLORIDE 20 MEQ/15ML (10%) PO SOLN
40.0000 meq | Freq: Two times a day (BID) | ORAL | Status: DC
  Administered 2016-05-10: 40 meq
  Filled 2016-05-10: qty 30

## 2016-05-10 MED ORDER — CHLORHEXIDINE GLUCONATE 0.12 % MT SOLN
OROMUCOSAL | Status: AC
  Filled 2016-05-10: qty 15

## 2016-05-10 MED ORDER — ALTEPLASE 2 MG IJ SOLR
4.0000 mg | Freq: Once | INTRAMUSCULAR | Status: DC
  Filled 2016-05-10: qty 4

## 2016-05-10 MED ORDER — FUROSEMIDE 10 MG/ML IJ SOLN: 20.0000 mg | Freq: Once | INTRAMUSCULAR | Status: AC

## 2016-05-10 MED ORDER — FUROSEMIDE 10 MG/ML IJ SOLN
40.0000 mg | Freq: Once | INTRAMUSCULAR | Status: AC
  Administered 2016-05-10: 40 mg via INTRAVENOUS
  Filled 2016-05-10: qty 4

## 2016-05-10 NOTE — Progress Notes (Signed)
CXR reviewed post thoracentesis.  Improved airspace disease on right, no evidence of pneumothorax.  Proceed with extubation.  DNR / DNI in the event of failure / arrest.  If pt were to decline, would need to update husband on status and provide comfort measures.    Noe Gens, NP-C [place] [facility] Pgr: [phone] or if no answer [phone] 05/10/2016, 11:51 AM

## 2016-05-10 NOTE — Procedures (Signed)
Extubation Procedure Note  Patient Details:   Name: Kristina Meyer DOB: 12/10/1940 MRN: [id number]   Airway Documentation:     Evaluation  O2 sats: 97 Complications: none  Patient tolerated procedure well. Bilateral Breath Sounds: Clear   Pt able to speak  Per CCM order, pt extubated and placed on 2L nasal cannula.  Tolerated well.  Martha Clan 05/10/2016, 12:14 PM

## 2016-05-10 NOTE — Progress Notes (Signed)
Date:  May 10, 2016 Chart reviewed for concurrent status and case management needs. Will continue to follow patient progress. Discharge Planning: following for needs Expected discharge date: [id number] Maicy Filip, BSN, RN3, CCM   [phone] 

## 2016-05-10 NOTE — Progress Notes (Signed)
She is in remission !!!! She is in remission!!! The bone marrow biopsy came back yesterday. I spoke with the pathologist. He says that he cannot see any obvious leukemia. The flow cytometry on the bone marrow did not show any obvious leukemic blasts. He says some of the changes are from her being on Neupogen.  This really is no surprise to me. Her white cell count came down fairly quickly. Her platelet count normalized. She actually did not require much in way of transfusion support.  I spoke with her husband yesterday. She was awake this morning. She was somewhat alert this morning. I really hope that she can get off the ventilator.  Given that she is in remission, I would certainly push to get her off the vent. Maybe she will need to have a tracheotomy.  She's getting tube feeds. This is very important. I think the left chest tube came out.  Her labs show a hemoglobin of 7.7. White cell count 9.9. Platelet count 197,000. I probably would transfuse her with 2 units.  Her echocardiogram done 2 weeks ago showed an ejection fraction of 35%. I wonder if this needs to be repeated.  She had a chest x-ray this morning. This showed stable bilateral pulmonary infiltrates.  She is off IV antibiotics. She is afebrile. Her blood pressure is 101/39. Pulse is 67.  The cellulitis on the distal right forearm looks better. Her lungs sound congested with bilateral rhonchi. Cardiac exam regular rate and rhythm. Abdomen is soft. There is no splenomegaly. Bowel sounds are present. Extremities shows some slight improvement in the pitting edema.  Again, she is in remission. This does not mean she is cured. However, this does improve her prognosis. We certainly do not and cannot do any chemotherapy. The decitabine worked incredibly well. 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  I would continue to be aggressive to try to get her off the ventilator. I think she does need to have a tracheotomy,  this might need to be considered.  It is obvious that the help by everybody in the ICU has contributed to this good bone marrow report.  We had a very good prayer session. I asked her if she wanted me to pray with her. She underwent stent of this and shook her head "yes".  Lattie Haw, MD [facility]al[facility] Milian 41:10

## 2016-05-10 NOTE — Progress Notes (Signed)
   05/10/16 1100  Clinical Encounter Type  Visited With Patient and family together  Visit Type Initial;Psychological support;Spiritual support;Critical Care  Referral From Nurse  Consult/Referral To Chaplain  Spiritual Encounters  Spiritual Needs Emotional;Other (Comment) (Pastoral Conversation/Support)  Stress Factors  Patient Stress Factors Health changes  Family Stress Factors Major life changes;Health changes;Other (Comment) (Healing Process for patient )   I visited with the patient's husband per referral by the nurse. I visited with him outside the door of the patient's room while she was having fluid taken.  The patient's husband was very tearful and is anxious about his wife's condition. He feels that they received good news yesterday, but states that he knows her recovery is going to be difficult.  The patient's husband found comfort in telling me about he and the patient's life together. He told stories and said that they have been together for 52 years.  Patient's husband says that they have good support from their community.  When the procedure was finished, I went and spoke with the wife. She could not talk and wanted to be extubated.  I provided her with a prayer shawl.  Please, contact Spiritual Care for further assistance.  [place] M.Div.

## 2016-05-10 NOTE — Progress Notes (Signed)
[facility] Progress Note Patient Name: Kristina Meyer DOB: April 17, 1941 MRN: [id number]   Date of Service  05/10/2016  HPI/Events of Note  Request to change KCL PO to IV.   eICU Interventions  Will change KCl dose to IV.     Intervention Category Minor Interventions: Routine modifications to care plan (e.g. PRN medications for pain, fever)  Kevia Zaucha Eugene 05/10/2016, 10:42 PM

## 2016-05-10 NOTE — Progress Notes (Signed)
Nutrition Follow-up  DOCUMENTATION CODES:   Not applicable  INTERVENTION:  - RD will follow-up 12/15.  NUTRITION DIAGNOSIS:   Inadequate oral intake related to inability to eat as evidenced by NPO status. -ongoing  GOAL:   Patient will meet greater than or equal to 90% of their needs -met with current TF regimen.   MONITOR:   TF tolerance, Vent status, Weight trends, Labs, I & O's  ASSESSMENT:   75 y.o. 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 The patient was in her usual state of health, got her Cytovene infusions last last Tuesday, and then over the last 3 or 4 days has developed progressive weakness and "just feeling awful". At first she thought she was just getting anemic again because of how tired she was over the weekend, but then tonight at 2AM she woke with fever to 102F, vomited, had worse malaise and chills and came to the ER.   She has had no dysuria or other urinary irritative symptoms.  She has had a new nonproductive cough this week.  No nose bleeds, bleeding from gums.  12/14 Pt with OGT and continues with Vital 1.2 @ 40 mL/hr and 30 mL Prostat BID. Estimated needs updated this AM based on CBW (64.6 kg). Current TF regimen is meeting 107% estimated kcal and 105% maximum estimated protein need. Per rounds, plan for one-way extubation (not terminal) this afternoon. Will adjust estimated needs 12/15 based on this plan. Per PCCM NP note this AM, biopsy did not show AML and Dr. Antonieta Pert note states pt is in remission.   Pt denied abdominal discomfort at time of RD visit. RN was at bedside at that time and reported that pt did have some discomfort yesterday and that pt had 4 BMs yesterday. Pt had thoracentesis this AM with 1050 mL clear pleural fluid removed at that time.   Patient is currently intubated on ventilator support MV: 6.6 L/min Temp (24hrs), Avg:98.2 F (36.8 C), Min:97.7 F (36.5 C),  Max:99.1 F (37.3 C) BP: 129/48 and MAP: 74.  Medications reviewed; 20 mg IV Pepcid BID, 20 mg IV Lasix x1 dose today, 40 mg IV Lasix x1 dose today, sliding scale Novolog, 15 mL liquid multivitamin/day, PRN IV Zofran. 40 mEq KCl per OGT x2 doses today.  Labs reviewed; CBGs: 98 and 121 mg/dL this AM, K: 3.2 mmol/L, BUN: 21 mg/dL, creatinine: 0.35 mg/dL, Ca: 8.2 mg/dL.  Drips: Fentanyl @ 50 mcg/hr and Precedex @ 0.5 mcg/kg/hr.     12/13 - Pt continues with OGT and is currently receiving TF at goal rate: Vital 1.2 @ 40 mL/hr with 30 mL Prostat BID.  - This regimen is providing 1352 kcal (106% re-estimated kcal need), 102 grams of protein, and 778 mL free water. - Pt more alert this AM and able to communicate with pointing and nodding/shaking head. - Estimated nutrition needs updated this AM and now based on weight later in the day of admission (66.8 kg) as this is more consistent with weight trends since admission than previously used weight (61 kg) which has now been an outlier since admission; estimated needs not overly significantly different between these two values.  - PCCM NP note this AM, pt is likely not a good candidate for trach placement.  - Bone marrow biopsy results still pending.  Patient is currently intubated on ventilator support MV: 8.9 L/min Temp (24hrs), Avg:97.2 F (36.2 C), Min:96.5 F (35.8 C), Max:98.3  F (36.8 C) BP: 149/68 and MAP: 96.  Drips: Fentanyl @ 25 mcg/hr and Precedex @ 1 mcg/kg/hr.    12/12 - Pt with OGT and continues with Vital 1.2 @ 35 mL/hr with 30 mL Prostat BID.  - Spoke with RN who reports that bone marrow biopsy was done early this AM.  - She states that pt had a large BM during biopsy; communicated plan to increase TF to goal rate at this time.  - Weight +10.4 kg since admission.  - Continue to use admission weight (61 kg) in estimating nutrition needs.  Patient is currently intubated on ventilator support MV: 8.3L/min Temp  (24hrs), Avg:98.5 F (36.9 C), Min:97.5 F (36.4 C), Max:99.6 F (37.6 C) BP: 136/52 and MAP: 78 Drips: Fentanyl @ 25 mcg/hr, Precedex @ 1 mcg/kg/hr.    Diet Order:  Diet NPO time specified  Skin:  Reviewed, no issues  Last BM:  12/13  Height:   Ht Readings from Last 1 Encounters:  04/25/16 5' 2"  (1.575 m)    Weight:   Wt Readings from Last 1 Encounters:  05/10/16 142 lb 6.7 oz (64.6 kg)    Ideal Body Weight:  50 kg  BMI:  Body mass index is 26.05 kg/m.  Estimated Nutritional Needs:   Kcal:  1268  Protein:  78-97 grams (1.2-1.5 grams/kg)  Fluid:  1.5L/day  EDUCATION NEEDS:   No education needs identified at this time    Jarome Matin, MS, RD, LDN, CNSC Inpatient Clinical Dietitian Pager # [phone] After hours/weekend pager # [phone]

## 2016-05-10 NOTE — Progress Notes (Signed)
PULMONARY / CRITICAL CARE MEDICINE   Name: Kristina Meyer MRN: [id number] DOB: [birth year]    ADMISSION DATE:  04/23/2016 CONSULTATION DATE:  04/24/16  REFERRING MD:  Dr Carolin Sicks, TRH  REASON FOR CONSULT: HCAP, neutropenic sepsis  BRIEF SUMMARY: 75 y/o F with AML admitted with neutropenic fever, infiltrates.    SUBJECTIVE:   Afebrile.  WBC stable.  Alert / comfortable on vent.  RN reports bone marrow biopsy returned with no evidence of AML, Chest tube "fell out" overnight.  Husband with multiple questions regarding plan of care.    VITAL SIGNS: BP (!) 117/48   Pulse 70   Temp 97.7 F (36.5 C) (Axillary)   Resp 16   Ht 5' 2"  (1.575 m)   Wt 142 lb 6.7 oz (64.6 kg)   SpO2 100%   BMI 26.05 kg/m   HEMODYNAMICS: CVP:  [4 mmHg-9 mmHg] 9 mmHg  VENTILATOR SETTINGS: Vent Mode: PRVC FiO2 (%):  [30 %-40 %] 40 % Set Rate:  [16 bmp] 16 bmp Vt Set:  [300 mL] 300 mL PEEP:  [8 cmH20] 8 cmH20 Pressure Support:  [10 cmH20] 10 cmH20 Plateau Pressure:  [14 cmH20-33 cmH20] 18 cmH20  INTAKE / OUTPUT: I/O last 3 completed shifts: In: 2474.8 [I.V.:894.8; NG/GT:1480; IV Piggyback:100] Out: 8366 [Urine:4495]  PHYSICAL EXAMINATION: General: chronically ill appearing on vent HENT: NCAT, ETT, MM pink/moist PULM: non-labored, diminished R lower, vent supported breaths CV: RRR, no mgr GI: BS+, soft, nontender MSK: normal bulk and tone Derm: redness R forearm resolving  Neuro: sedate, arouses/appropriate, generalized weakness    LABS: PULMONARY  Recent Labs Lab 05/04/16 0935  PHART 7.408  PCO2ART 54.4*  PO2ART 79.3*  HCO3 33.6*  O2SAT 95.8    CBC  Recent Labs Lab 05/08/16 0345 05/09/16 0337 05/10/16 0451  HGB 8.1* 7.6* 7.7*  HCT 25.0* 23.2* 23.3*  WBC 8.1 7.1 9.9  PLT 184 174 197    COAGULATION No results for input(s): INR in the last 168 hours.  CARDIAC  No results for input(s): TROPONINI in the last 168 hours. No results for input(s): PROBNP in the last 168  hours.   CHEMISTRY  Recent Labs Lab 05/05/16 0511 05/06/16 0011 05/06/16 0444 05/07/16 0410 05/08/16 0345 05/09/16 0337 05/10/16 0451  NA 141 140  --   --  140 139 140  K 3.6 3.5  --   --  3.1* 3.2* 3.2*  CL 101 99*  --   --  100* 101 101  CO2 36* 36*  --   --  35* 33* 35*  GLUCOSE 147* 141*  --   --  150* 147* 126*  BUN 22* 21*  --   --  19 22* 21*  CREATININE <0.30* 0.38*  --   --  0.36* 0.37* 0.35*  CALCIUM 7.8* 7.9*  --   --  7.8* 7.9* 8.2*  MG 1.8  --  1.9 2.0 1.9 1.8 1.8  PHOS 3.0  --  2.7 3.1 3.6 3.7  --    Estimated Creatinine Clearance: 52 mL/min (by C-G formula based on SCr of 0.35 mg/dL (L)).   LIVER  Recent Labs Lab 05/06/16 0444  AST 18  ALT 12*  ALKPHOS 97  BILITOT 1.0  PROT 4.6*  ALBUMIN 1.8*     INFECTIOUS No results for input(s): LATICACIDVEN, PROCALCITON in the last 168 hours.   ENDOCRINE CBG (last 3)   Recent Labs  05/09/16 2343 05/10/16 0356 05/10/16 0745  GLUCAP 133* 121* 98     IMAGING  CXR 12/12 >> images personally reviewed, increased effusion on R, worsening of airspace disease, left chest tube/ETT/port in position  STUDIES:  CT chest 11/28 >> No definite CT evidence for acute pulmonary embolus or aortic dissection.  Moderate large left pleural effusion. Dense left lower lobe consolidation with partial consolidation in the lingula and right lower lobe which may represent pneumonia.  Mild mid esophageal thickening Echo 12/3 > EF 35%, hypokinesis (diffuse) Bone marrow Bx 12/12 >> per report of Dr. Marin Olp - no evidence of leukemia on BM, flow cytometry with no leukemic blasts  LINES/TUBES: Port-s-cath >>  OETT 11/29 >> Left IJ CVL 11/29 >> Chest tube (L) 11/30 >> 12/13  SIGNIFICANT EVENTS: 11/29 - thora. Incomplete drainage of left pleural space 11/30 - Left Chest tube placed.  12/04 - Bronch 12/08 - Required IVF for low bp. Lasix held 12/09 - new Rt distal forearm redness x few days. Not on pressors. Failed SBT  yesterday after 3h with agitation/desaturation /tacycardia. Currently on fent 50 and doing sbt and followng commands with calm. Fever coming down, abx d/c'd  12/10 - failed SBT, WBC up. Got low bp with lasix and given albumin. 1 unit prbc this AM for hgb 6.9.  12/12 - weaned on 12/5 12/13 - chest tube fell out  CULTURES: Blood 11/27 >>  (-) Urine 11/27 >> insignificant growth Blood 11/28 >> (-) Urine 11/28 >> (-) Viral resp panel 11/28 >> neg Pleural fluid 11/29>>>no orgs but abundant wbc BAL bacterial 12/4 >> negative BAL fungal 12/4 >> scant candida guilliermondii BAL afb12/4 >> BAL respiratory panel 12/4 > negative  ANTIBIOTICS: Cefepime 11/27 >> 11/28 azithro 11/27 >> 12/2 vanco 11/28 >> 12/9 meropenem 11/28 >> 12/9  anidulafungin 11/30 >> 12/11 Acyclovir 12/1 >> 12/11     ASSESSMENT / PLAN:  PULMONARY A: Acute respiratory failure with hypoxemia HCAP Complicated left pleural effusion, s/p CT drainage P:   PRVC overnight / rest mode Daily SBT as tolerated  Intermittent CXR See ID VAP prevention protocol Assess right pleural space  Lasix 40 mg IV x1  CARDIOVASCULAR A:  Systolic CHE EF 01%, with acute pulm edema and diffuse hypokinesis/demand ischemia Hypotension - resolved, diuresis + sedation P:  Monitor hemodynamics / tele   RENAL A:   AKI - resolved Hyponatremia  Hypokalemia P:   Monitor BMET and UOP Replace electrolytes as needed Lasix 40 mg IV x1  GASTROINTESTINAL A:   Stress ulcer prophylaxis Nutrition Vomiting on 12/3 P:   H2B for SUP Cont tube feeding  HEMATOLOGIC / ONCOLOGIC A:   AML received treatment one week prior to admission Febrile Neutropenia Neutropenia resolved Thrombocytopenia, resolved Anemia s/p PRBC 11/27, 12/5 P: Follow CBC + diff Appreciate Dr. Antonieta Pert assistance Bone marrow biopsy results as above ? If we should resume pharmacologic DVT prophylaxis  Continue SCD's   INFECTIOUS A:   Worsening R Lung  infiltrate 12/11, worrisome for worsening HCAP / ? Layering effusion RUL and LLL HCAP Concern for Opportunistic infection given persistent neutropenia and AML and recent chemo P:   ABX / cultures as above  Monitor fever curve / WBC  ENDOCRINE A:   Hyperglycemia  P:   Follow glucose on BMP  NEUROLOGIC A:   Anxiety / Depression Sedation needs for vent synchrony P:   RASS goal: -1 Fentanyl drip. PRN versed.  Precedex gtt per PAD protocol Daily SBT/WUA   FAMILY  - Updates:  Husband on 12/14 am  CC Time: 30 minutes  Noe Gens, NP-C [place]  [facility] Pgr: [phone] or if no answer [phone] 05/10/2016, 8:26 AM

## 2016-05-10 NOTE — Procedures (Signed)
Thoracentesis Procedure Note  Pre-operative Diagnosis: R pleural effusion  Post-operative Diagnosis: same  Indications: Respiratory failure, acute  Procedure Details  Consent: Informed consent was obtained. Risks of the procedure were discussed including: infection, bleeding, pain, pneumothorax.  Under sterile conditions the patient was positioned. Betadine solution and sterile drapes were utilized.  1% buffered lidocaine was used to anesthetize the 8th rib space. Fluid was obtained without any difficulties and minimal blood loss.  A dressing was applied to the wound and wound care instructions were provided.   Findings 1050 ml of clear pleural fluid was obtained. A sample was sent to Pathology for cytogenetics, flow, and cell counts, as well as for infection analysis.  Complications:  None; patient tolerated the procedure well.          Condition: stable  Plan A follow up chest x-ray was ordered. Bed Rest for 0 hours. Tylenol 650 mg. for pain.  Attending Attestation: I performed the procedure.  Roselie Awkward, MD [facility] Pager: [phone] Cell: [phone] After 3pm or if no response, call [phone]

## 2016-05-11 ENCOUNTER — Inpatient Hospital Stay (HOSPITAL_COMMUNITY): Payer: Medicare Other

## 2016-05-11 LAB — CBC
HCT: 29.3 % — ABNORMAL LOW (ref 36.0–46.0)
Hemoglobin: 9.6 g/dL — ABNORMAL LOW (ref 12.0–15.0)
MCH: 29.5 pg (ref 26.0–34.0)
MCHC: 32.8 g/dL (ref 30.0–36.0)
MCV: 90.2 fL (ref 78.0–100.0)
PLATELETS: 286 10*3/uL (ref 150–400)
RBC: 3.25 MIL/uL — AB (ref 3.87–5.11)
RDW: 22.1 % — AB (ref 11.5–15.5)
WBC: 16.7 10*3/uL — ABNORMAL HIGH (ref 4.0–10.5)

## 2016-05-11 LAB — GLUCOSE, CAPILLARY
GLUCOSE-CAPILLARY: 103 mg/dL — AB (ref 65–99)
GLUCOSE-CAPILLARY: 115 mg/dL — AB (ref 65–99)
Glucose-Capillary: 109 mg/dL — ABNORMAL HIGH (ref 65–99)
Glucose-Capillary: 92 mg/dL (ref 65–99)
Glucose-Capillary: 96 mg/dL (ref 65–99)
Glucose-Capillary: 98 mg/dL (ref 65–99)

## 2016-05-11 LAB — COMPREHENSIVE METABOLIC PANEL
ALK PHOS: 116 U/L (ref 38–126)
ALT: 20 U/L (ref 14–54)
AST: 26 U/L (ref 15–41)
Albumin: 2.3 g/dL — ABNORMAL LOW (ref 3.5–5.0)
Anion gap: 7 (ref 5–15)
BUN: 15 mg/dL (ref 6–20)
CALCIUM: 7.8 mg/dL — AB (ref 8.9–10.3)
CO2: 30 mmol/L (ref 22–32)
CREATININE: 0.39 mg/dL — AB (ref 0.44–1.00)
Chloride: 101 mmol/L (ref 101–111)
Glucose, Bld: 116 mg/dL — ABNORMAL HIGH (ref 65–99)
Potassium: 3.3 mmol/L — ABNORMAL LOW (ref 3.5–5.1)
Sodium: 138 mmol/L (ref 135–145)
Total Bilirubin: 1.2 mg/dL (ref 0.3–1.2)
Total Protein: 5.3 g/dL — ABNORMAL LOW (ref 6.5–8.1)

## 2016-05-11 LAB — PH, BODY FLUID: PH, BODY FLUID: 7.9

## 2016-05-11 MED ORDER — SODIUM CHLORIDE 0.9 % IV SOLN
30.0000 meq | Freq: Once | INTRAVENOUS | Status: AC
  Administered 2016-05-11: 30 meq via INTRAVENOUS
  Filled 2016-05-11: qty 15

## 2016-05-11 MED ORDER — ORAL CARE MOUTH RINSE
15.0000 mL | Freq: Two times a day (BID) | OROMUCOSAL | Status: DC
  Administered 2016-05-11 – 2016-05-13 (×4): 15 mL via OROMUCOSAL

## 2016-05-11 NOTE — Progress Notes (Signed)
PULMONARY / CRITICAL CARE MEDICINE   Name: Kristina Meyer MRN: [id number] DOB: 11-29-1940    ADMISSION DATE:  04/23/2016 CONSULTATION DATE:  04/24/16  REFERRING MD:  Dr Carolin Sicks, TRH  REASON FOR CONSULT: HCAP, neutropenic sepsis  BRIEF SUMMARY: 75 y/o F with AML admitted with neutropenic fever, infiltrates.    SUBJECTIVE:   Afebrile.  WBC stable.  Alert / comfortable on vent.  RN reports bone marrow biopsy returned with no evidence of AML, Chest tube "fell out" overnight.  Husband with multiple questions regarding plan of care.    VITAL SIGNS: BP (!) 155/57 (BP Location: Left Arm)   Pulse (!) 103   Temp 98.3 F (36.8 C) (Oral)   Resp (!) 22   Ht 5' 2"  (1.575 m)   Wt 139 lb 12.4 oz (63.4 kg)   SpO2 98%   BMI 25.56 kg/m   HEMODYNAMICS:    VENTILATOR SETTINGS: Vent Mode: PRVC FiO2 (%):  [40 %] 40 % Set Rate:  [16 bmp] 16 bmp Vt Set:  [300 mL] 300 mL PEEP:  [8 cmH20] 8 cmH20 Plateau Pressure:  [18 cmH20] 18 cmH20  INTAKE / OUTPUT: I/O last 3 completed shifts: In: 2043.7 [I.V.:445.4; Blood:410; Other:80; NG/GT:723.3; IV Piggyback:385] Out: 6720 [Urine:3620; Emesis/NG output:1175]  PHYSICAL EXAMINATION: General: chronically ill appearing off vent, weak but cooperative HENT: NCAT, MM pink/moist PULM: decreased bs bases CV: RRR, no mgr GI: BS+, soft, nontender MSK: normal bulk and tone Derm: redness R forearm resolving  Neuro:awake/appropriate, generalized weakness    LABS: PULMONARY  Recent Labs Lab 05/04/16 0935  PHART 7.408  PCO2ART 54.4*  PO2ART 79.3*  HCO3 33.6*  O2SAT 95.8    CBC  Recent Labs Lab 05/09/16 0337 05/10/16 0451 05/10/16 1756 05/11/16 0343  HGB 7.6* 7.7* 10.4* 9.6*  HCT 23.2* 23.3* 32.0* 29.3*  WBC 7.1 9.9  --  16.7*  PLT 174 197  --  286    COAGULATION No results for input(s): INR in the last 168 hours.  CARDIAC  No results for input(s): TROPONINI in the last 168 hours. No results for input(s): PROBNP in the last 168  hours.   CHEMISTRY  Recent Labs Lab 05/05/16 0511 05/06/16 0011 05/06/16 0444 05/07/16 0410 05/08/16 0345 05/09/16 0337 05/10/16 0451 05/11/16 0343  NA 141 140  --   --  140 139 140 138  K 3.6 3.5  --   --  3.1* 3.2* 3.2* 3.3*  CL 101 99*  --   --  100* 101 101 101  CO2 36* 36*  --   --  35* 33* 35* 30  GLUCOSE 147* 141*  --   --  150* 147* 126* 116*  BUN 22* 21*  --   --  19 22* 21* 15  CREATININE <0.30* 0.38*  --   --  0.36* 0.37* 0.35* 0.39*  CALCIUM 7.8* 7.9*  --   --  7.8* 7.9* 8.2* 7.8*  MG 1.8  --  1.9 2.0 1.9 1.8 1.8  --   PHOS 3.0  --  2.7 3.1 3.6 3.7  --   --    Estimated Creatinine Clearance: 51.5 mL/min (by C-G formula based on SCr of 0.39 mg/dL (L)).   LIVER  Recent Labs Lab 05/06/16 0444 05/10/16 1147 05/11/16 0343  AST 18  --  26  ALT 12*  --  20  ALKPHOS 97  --  116  BILITOT 1.0  --  1.2  PROT 4.6* 5.6* 5.3*  ALBUMIN 1.8*  --  2.3*     INFECTIOUS No results for input(s): LATICACIDVEN, PROCALCITON in the last 168 hours.   ENDOCRINE CBG (last 3)   Recent Labs  05/10/16 1954 05/11/16 0018 05/11/16 0357  GLUCAP 114* 103* 109*     IMAGING  CXR 12/12 >> images personally reviewed, increased effusion on R, worsening of airspace disease, left chest tube/ETT/port in position  STUDIES:  CT chest 11/28 >> No definite CT evidence for acute pulmonary embolus or aortic dissection.  Moderate large left pleural effusion. Dense left lower lobe consolidation with partial consolidation in the lingula and right lower lobe which may represent pneumonia.  Mild mid esophageal thickening Echo 12/3 > EF 35%, hypokinesis (diffuse) Bone marrow Bx 12/12 >> per report of Dr. Marin Olp - no evidence of leukemia on BM, flow cytometry with no leukemic blasts  LINES/TUBES: Port-s-cath >>  OETT 11/29 >>12/14 Left IJ CVL 11/29 >> Chest tube (L) 11/30 >> 12/13  SIGNIFICANT EVENTS: 11/29 - thora. Incomplete drainage of left pleural space 11/30 - Left Chest tube  placed.  12/04 - Bronch 12/08 - Required IVF for low bp. Lasix held 12/09 - new Rt distal forearm redness x few days. Not on pressors. Failed SBT yesterday after 3h with agitation/desaturation /tacycardia. Currently on fent 50 and doing sbt and followng commands with calm. Fever coming down, abx d/c'd  12/10 - failed SBT, WBC up. Got low bp with lasix and given albumin. 1 unit prbc this AM for hgb 6.9.  12/12 - weaned on 12/5 12/13 - chest tube fell out 12/14 extubated 12/14 rt thora 1050 cc clear   CULTURES: Blood 11/27 >>  (-) Urine 11/27 >> insignificant growth Blood 11/28 >> (-) Urine 11/28 >> (-) Viral resp panel 11/28 >> neg Pleural fluid 11/29>>>no orgs but abundant wbc BAL bacterial 12/4 >> negative BAL fungal 12/4 >> scant candida guilliermondii BAL afb12/4 >> BAL respiratory panel 12/4 > negative Pleural fluid 12/14>>  ANTIBIOTICS: Cefepime 11/27 >> 11/28 azithro 11/27 >> 12/2 vanco 11/28 >> 12/9 meropenem 11/28 >> 12/9  anidulafungin 11/30 >> 12/11 Acyclovir 12/1 >> 12/11     ASSESSMENT / PLAN:  PULMONARY A: Acute respiratory failure with hypoxemia HCAP Complicated left pleural effusion, s/p CT drainage P:   Extubated 12/14  Intermittent CXR See ID Assess right pleural space done 12/14 with 1053m of clear fluid and follow resutts Lasix 40 mg IV x1  Once K+ given Transfer to SDU and have [facility] assume her care as of 12/16  CARDIOVASCULAR A:  Systolic CHE EF 328% with acute pulm edema and diffuse hypokinesis/demand ischemia Hypotension - resolved, diuresis + sedation P:  Monitor hemodynamics / tele   RENAL A:   AKI - resolved Hyponatremia  Hypokalemia P:   Monitor BMET and UOP Replace electrolytes as needed Lasix 40 mg IV x1 after potassium given IV 12/15  GASTROINTESTINAL A:   Stress ulcer prophylaxis Nutrition Vomiting on 12/3 P:   H2B for SUP Diet as tolerated  HEMATOLOGIC / ONCOLOGIC A:   AML received treatment one week prior  to admission Febrile Neutropenia Neutropenia resolved Thrombocytopenia, resolved Anemia s/p PRBC 11/27, 12/5 P: Follow CBC + diff Appreciate Dr. EAntonieta Pertassistance Bone marrow biopsy results as above ? If we should resume pharmacologic DVT prophylaxis  Continue SCD's   INFECTIOUS A:   Worsening R Lung infiltrate 12/11, worrisome for worsening HCAP / ? Layering effusion RUL and LLL HCAP Concern for Opportunistic infection given persistent neutropenia and AML and recent chemo P:  ABX / cultures as above  Monitor fever curve / WBC  ENDOCRINE CBG (last 3)   Recent Labs  05/10/16 1954 05/11/16 0018 05/11/16 0357  GLUCAP 114* 103* 109*     A:   Hyperglycemia  P:   Follow glucose on BMP  NEUROLOGIC A:   Anxiety / Depression Sedation needs for vent synchrony P:   RASS goal: 0 Off sedation Start PT/OT    FAMILY  - Updates:  Husband on 12/15 and pt.  Richardson Landry Jhalil Silvera ACNP [facility] P[facility] Pager [phone] till 3 pm If no answer page [phone] 05/11/2016, 9:17 AM

## 2016-05-11 NOTE — Progress Notes (Signed)
Kristina Meyer is now extubated. She looks good. She sounds pretty good. She is pretty aware of what is going on.  I'm just so excited that she is made this progress. She had a chest x-ray today. It showed an unchanged left pleural effusion. She had bilateral airspace disease.  She did get blood yesterday. Her color looks very good.  She will have a swallowing study today. Hopefully she will to begin eating. This be the best thing for her.  She's had no fever.  She had 1000 mL of fluid taken off the right lung. Cultures are pending. I wonder if she may have some element of congestive heart failure. Possibly an echocardiogram will help.  The fluid protein is less than 3. Her LDH is 85. It seems like this is a transudate.  Her laboratory studies show her white cell count to be 16.7. Hemoglobin 9.6 and platelet count 286,000. Her potassium is 3.3. Her albumin is 2.3.  On her physical exam, her temperature is 98.5. Pulse is 107. Blood pressure 147/80. Her lungs sound relatively clear. She may have some crackles bilaterally. I hear no wheezes. Cardiac exam tachycardic but regular. Abdomen is soft. There is no palpable liver or spleen tip. Extremities shows nice improvement of the cellulitis in the right forearm. She has 2+ edema in her legs. Neurological exam shows no focal neurological deficits.  At this point, [name] is clearly in rehabilitation mode. She definitely needs physical therapy. Over she will be able to eat. Her nutritional state is what will determine her prognosis.  Her white cell count is a little elevated. I'm not sure why this would be. It might be reactionary.  She got the 2 units of blood yesterday. Her hemoglobins come up nicely.  Hopefully she will be able to get out of bed today. I know that she really won't be oh to walk because she basically has been in bed for 3 weeks or more.  I would think that she will be transferred out of the ICU over the weekend.  As always, the  staff in the ICU has done a tremendous job in caring for her.  Lattie Haw, MD  Psalm 46:1

## 2016-05-11 NOTE — Progress Notes (Signed)
Nutrition Follow-up  DOCUMENTATION CODES:   Not applicable  INTERVENTION:  - Diet advancement as medically feasible/tolerated. - RD will follow-up 12/18 and monitor for nutrition-related needs at that time.   NUTRITION DIAGNOSIS:   Inadequate oral intake related to inability to eat as evidenced by NPO status. -ongoing  GOAL:   Patient will meet greater than or equal to 90% of their needs -unmet with TF now off and diet not yet able to be advanced.  MONITOR:   Diet advancement, PO intake, Weight trends, Labs, I & O's  ASSESSMENT:   75 y.o. 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 The patient was in her usual state of health, got her Cytovene infusions last last Tuesday, and then over the last 3 or 4 days has developed progressive weakness and "just feeling awful". At first she thought she was just getting anemic again because of how tired she was over the weekend, but then tonight at 2AM she woke with fever to 102F, vomited, had worse malaise and chills and came to the ER.   She has had no dysuria or other urinary irritative symptoms.  She has had a new nonproductive cough this week.  No nose bleeds, bleeding from gums.  12/15 Pt extubated yesterday shortly after 1200 and OGT removed at that time. Per notes, plan for swallow evaluation today. Estimated nutrition needs updated based on extubation. RD will follow-up on Monday and monitor for nutrition-related needs based on diet advancement. Weight -1.2 kg from yesterday. Plan to change from ICU-status to SDU-status today.   Medications reviewed; 20 mg IV Pepcid BID, 20 mg IV Lasix x1 dose yesterday, sliding scale Novolog, 15 mL liquid multivitamin/day, PRN IV Zofran, 30 mEq IV KCl x1 dose today. Labs reviewed; K: 3.3 mmol/L, Ca: 7.8 mg/dL, creatinine: 0.39 mg/dL.   12/14 Pt with OGT and continues with Vital 1.2 @ 40 mL/hr and 30 mL Prostat BID. Estimated needs  updated this AM based on CBW (64.6 kg). Current TF regimen is meeting 107% estimated kcal and 105% maximum estimated protein need. Per rounds, plan for one-way extubation (not terminal) this afternoon. Will adjust estimated needs 12/15 based on this plan. Per PCCM NP note this AM, biopsy did not show AML and Dr. Antonieta Pert note states pt is in remission.   Pt denied abdominal discomfort at time of RD visit. RN was at bedside at that time and reported that pt did have some discomfort yesterday and that pt had 4 BMs yesterday. Pt had thoracentesis this AM with 1050 mL clear pleural fluid removed at that time.   Patient is currently intubated on ventilator support MV: 6.6 L/min Temp (24hrs), Avg:98.2 F (36.8 C), Min:97.7 F (36.5 C), Max:99.1 F (37.3 C) BP: 129/48 and MAP: 74. Drips: Fentanyl @ 50 mcg/hr and Precedex @ 0.5 mcg/kg/hr.     12/13 - Pt continues with OGT and is currently receiving TF at goal rate: Vital 1.2 @ 40 mL/hr with 30 mL Prostat BID.  - This regimen is providing 1352 kcal (106% re-estimated kcal need), 102 grams of protein, and 778 mL free water. - Pt more alert this AM and able to communicate with pointing and nodding/shaking head. - Estimated nutrition needs updated this AM and now based on weight later in the day of admission (66.8 kg) as this is more consistent with weight trends since admission than previously used weight (61 kg) which has now been an outlier  since admission; estimated needs not overly significantly different between these two values.  - PCCM NP note this AM, pt is likely not a good candidate for trach placement.  - Bone marrow biopsy results still pending.  Patient is currently intubated on ventilator support MV: 8.9L/min Temp (24hrs), Avg:97.2 F (36.2 C), Min:96.5 F (35.8 C), Max:98.3 F (36.8 C) BP: 149/68 and MAP: 96.  Drips:Fentanyl @ 25 mcg/hr and Precedex @ 1 mcg/kg/hr.    Diet Order:   NPO  Skin:  Reviewed, no  issues  Last BM:  12/14  Height:   Ht Readings from Last 1 Encounters:  04/25/16 _0  (1.575 m)    Weight:   Wt Readings from Last 1 Encounters:  05/11/16 139 lb 12.4 oz (63.4 kg)    Ideal Body Weight:  50 kg  BMI:  Body mass index is 25.56 kg/m.  Estimated Nutritional Needs:   Kcal:  1585-1775 (25-28 kcal/kg)  Protein:  63-76 grams (1-1.2 grams/kg)  Fluid:  1.5L/day  EDUCATION NEEDS:   No education needs identified at this time    Jarome Matin, MS, RD, LDN, CNSC Inpatient Clinical Dietitian Pager # [phone] After hours/weekend pager # [phone]

## 2016-05-11 NOTE — Evaluation (Signed)
Clinical/Bedside Swallow Evaluation Patient Details  Name: Kristina Meyer MRN: [id number] Date of Birth: 1940/06/25  Today's Date: 05/11/2016 Time: SLP Start Time (ACUTE ONLY): 1200 SLP Stop Time (ACUTE ONLY): 1220 SLP Time Calculation (min) (ACUTE ONLY): 20 min  Past Medical History:  Past Medical History:  Diagnosis Date  . 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 Anxiety   . Heart murmur    echo done 20 yrs ago  normal  . Hypertension   . PONV (postoperative nausea and vomiting)    req patch   Past Surgical History:  Past Surgical History:  Procedure Laterality Date  . BREAST CYST EXCISION Left 1970  . BREAST SURGERY Left 76   lump   . EYE SURGERY Bilateral 09   cataracts  . HEMORRHOID SURGERY  85  . IR GENERIC HISTORICAL  01/11/2016   IR US GUIDE VASC ACCESS RIGHT 01/11/2016 Jacqulynn Cadet, MD [facility]  . IR GENERIC HISTORICAL  01/11/2016   IR FLUORO GUIDE CV LINE RIGHT 01/11/2016 Jacqulynn Cadet, MD [facility]  . LUMBAR LAMINECTOMY/DECOMPRESSION MICRODISCECTOMY Right 12/02/2013   Procedure: L5-S1 RIGHT DISCECTOMY;  Surgeon: Melina Schools, MD;  Location: [place];  Service: Orthopedics;  Laterality: Right;   HPI:  75 y/o F with AML admitted with neutropenic fever, infiltrates.  Pt being treated for acute Myeloid leukemia, admitted to ICU with sepsis, on ventilator from 11/29 to 12/14. Dr. Antonieta Pert note reports she is in remission.    Assessment / Plan / Recommendation Clinical Impression  Pt demonstrates excellent tolerance of PO considering prolonged intubation. Pt is now 24 hours s/p 14 day intubation, but she is alert, upright with clear vocal quality. She consumed 8 oz of thin liquids, including 3 oz of water consumed consecutively via straw, with no immediate or delayed signs of aspiration. Pt also tolerated solids well. Will initiate a regular diet and thin liquids, but given risk for silent aspiration due to prolonged intubation, will f/u to check pts  tolerance of diet and check in with RN over the weekend via phone. Please document any concern for aspiration and contact SLP weekend pager with concerns [phone].     Aspiration Risk  Moderate aspiration risk    Diet Recommendation Regular;Thin liquid   Liquid Administration via: Cup;Straw Medication Administration: Whole meds with liquid Supervision: Patient able to self feed Compensations: Slow rate;Small sips/bites Postural Changes: Seated upright at 90 degrees    Other  Recommendations Oral Care Recommendations: Oral care BID   Follow up Recommendations 24 hour supervision/assistance      Frequency and Duration min 1 x/week  1 week       Prognosis        Swallow Study   General HPI: 75 y/o F with AML admitted with neutropenic fever, infiltrates.  Pt being treated for acute Myeloid leukemia, admitted to ICU with sepsis, on ventilator from 11/29 to 12/14. Dr. Antonieta Pert note reports she is in remission.  Type of Study: Bedside Swallow Evaluation Previous Swallow Assessment: none Diet Prior to this Study: NPO Temperature Spikes Noted: No Respiratory Status: Nasal cannula History of Recent Intubation: Yes Length of Intubations (days): 14 days Date extubated: 05/10/16 Behavior/Cognition: Alert;Cooperative;Pleasant mood Oral Cavity Assessment: Other (comment);Lesions (mild ulceration on tongue) Oral Care Completed by SLP: No Oral Cavity - Dentition: Adequate natural dentition Vision: Functional for self-feeding Self-Feeding Abilities: Able to feed self Patient Positioning: Upright in chair Baseline Vocal Quality: Normal Volitional Cough: Strong Volitional Swallow: Able to elicit    Oral/Motor/Sensory  Function Overall Oral Motor/Sensory Function: Within functional limits   Ice Chips     Thin Liquid Thin Liquid: Within functional limits Presentation: Cup;Straw;Self Fed    Nectar Thick Nectar Thick Liquid: Not tested   Honey Thick Honey Thick Liquid: Not tested    Puree Puree: Within functional limits   Solid   GO   Solid: Within functional limits Presentation: [place] Annica Marinello, MA CCC-SLP   Paulene Tayag, Katherene Ponto 05/11/2016,12:26 PM

## 2016-05-12 ENCOUNTER — Inpatient Hospital Stay (HOSPITAL_COMMUNITY): Payer: Medicare Other

## 2016-05-12 LAB — BASIC METABOLIC PANEL
Anion gap: 12 (ref 5–15)
BUN: 9 mg/dL (ref 6–20)
CHLORIDE: 101 mmol/L (ref 101–111)
CO2: 27 mmol/L (ref 22–32)
CREATININE: 0.48 mg/dL (ref 0.44–1.00)
Calcium: 7.9 mg/dL — ABNORMAL LOW (ref 8.9–10.3)
GFR calc non Af Amer: >60 mL/min (ref >60)
Glucose, Bld: 102 mg/dL — ABNORMAL HIGH (ref 65–99)
Potassium: 2.8 mmol/L — ABNORMAL LOW (ref 3.5–5.1)
Sodium: 140 mmol/L (ref 135–145)

## 2016-05-12 LAB — CBC
HCT: 29.3 % — ABNORMAL LOW (ref 36.0–46.0)
HEMOGLOBIN: 9.9 g/dL — AB (ref 12.0–15.0)
MCH: 30.3 pg (ref 26.0–34.0)
MCHC: 33.8 g/dL (ref 30.0–36.0)
MCV: 89.6 fL (ref 78.0–100.0)
Platelets: 314 10*3/uL (ref 150–400)
RBC: 3.27 MIL/uL — ABNORMAL LOW (ref 3.87–5.11)
RDW: 22.2 % — ABNORMAL HIGH (ref 11.5–15.5)
WBC: 14.9 10*3/uL — ABNORMAL HIGH (ref 4.0–10.5)

## 2016-05-12 LAB — GLUCOSE, CAPILLARY
GLUCOSE-CAPILLARY: 100 mg/dL — AB (ref 65–99)
GLUCOSE-CAPILLARY: 100 mg/dL — AB (ref 65–99)
GLUCOSE-CAPILLARY: 92 mg/dL (ref 65–99)
Glucose-Capillary: 114 mg/dL — ABNORMAL HIGH (ref 65–99)
Glucose-Capillary: 109 mg/dL — ABNORMAL HIGH (ref 65–99)
Glucose-Capillary: 105 mg/dL — ABNORMAL HIGH (ref 65–99)

## 2016-05-12 LAB — MAGNESIUM: Magnesium: 2.5 mg/dL — ABNORMAL HIGH (ref 1.7–2.4)

## 2016-05-12 LAB — PHOSPHORUS: PHOSPHORUS: 3.3 mg/dL (ref 2.5–4.6)

## 2016-05-12 MED ORDER — HALOPERIDOL LACTATE 5 MG/ML IJ SOLN
3.0000 mg | Freq: Once | INTRAMUSCULAR | Status: AC
  Administered 2016-05-12: 3 mg via INTRAVENOUS
  Filled 2016-05-12: qty 1

## 2016-05-12 MED ORDER — TEMAZEPAM 7.5 MG PO CAPS
7.5000 mg | ORAL_CAPSULE | Freq: Once | ORAL | Status: DC
  Filled 2016-05-12: qty 1

## 2016-05-12 MED ORDER — POTASSIUM CHLORIDE CRYS ER 20 MEQ PO TBCR
40.0000 meq | EXTENDED_RELEASE_TABLET | ORAL | Status: AC
  Administered 2016-05-12 (×2): 40 meq via ORAL
  Filled 2016-05-12 (×2): qty 2

## 2016-05-12 MED ORDER — MAGNESIUM SULFATE 2 GM/50ML IV SOLN
2.0000 g | Freq: Once | INTRAVENOUS | Status: AC
  Administered 2016-05-12: 2 g via INTRAVENOUS
  Filled 2016-05-12: qty 50

## 2016-05-12 MED ORDER — FAMOTIDINE 20 MG PO TABS
20.0000 mg | ORAL_TABLET | Freq: Two times a day (BID) | ORAL | Status: DC
  Administered 2016-05-12 – 2016-05-16 (×8): 20 mg via ORAL
  Filled 2016-05-12 (×8): qty 1

## 2016-05-12 MED ORDER — ADULT MULTIVITAMIN W/MINERALS CH
1.0000 | ORAL_TABLET | Freq: Every day | ORAL | Status: DC
  Administered 2016-05-13 – 2016-05-16 (×4): 1 via ORAL
  Filled 2016-05-12 (×4): qty 1

## 2016-05-12 MED ORDER — CARVEDILOL 3.125 MG PO TABS
3.1250 mg | ORAL_TABLET | Freq: Two times a day (BID) | ORAL | Status: DC
  Administered 2016-05-12 – 2016-05-16 (×9): 3.125 mg via ORAL
  Filled 2016-05-12 (×9): qty 1

## 2016-05-12 NOTE — Progress Notes (Addendum)
PROGRESS NOTE  Kristina Meyer [id number] DOB: [birth year] DOA: 04/23/2016 PCP: Jobe Marker, MD  Brief summary:  75 yo woman with HTN and AML receiving decitabine, last dose 1 weeks ago prior to this hospitalization. Reported progressive weakness, lethargy, fever 103, and malaise. Also developed a non-productive cough. Presented for eval that revealed LLL infiltrate.  She is admitted to hospitalist service on 11/27,.  Flu screen, pneumococcal PNA, legionella PNA screens negative.  Hgb was 6.7, received PRBC. She started cefepime and azithro. All cx data so far negative.   On 11/28 has continued to have fever, WBC dropping,she became neutropenia,  has had some intermittent episodes of hypotension. She received IVF and abx were changed to vanco + imipenem. She has been progressively weak and now moves to SDU. PCCM consulted to eval , patient has significant pleural effusion, developed worsening respiratory status, she had bedside thoracentesis on 11/29,   she continue deteriorate and was then intubated on 11/30, she was put on pressors due to hypotension. She was admitted to ICU. She underwent bronchoscopy on 12/4, she has chest tube placed, she eventually showed improvement and was extubated on 12/14, patient code status changed to DNR.  Patient is transferred back to hospitalist service on 12/16.   LINES/TUBES: Port-s-cath >>  OETT 11/29 >>12/14 Left IJ CVL 11/29 >> Chest tube (L) 11/30 >> 12/13  SIGNIFICANT EVENTS: 11/29 - thora. Incomplete drainage of left pleural space 11/30 - Left Chest tube placed.  12/04 - Bronch 12/08 - Required IVF for low bp. Lasix held 12/09 - new Rt distal forearm redness x few days. Not on pressors. Failed SBT yesterday after 3h with agitation/desaturation /tacycardia. Currently on fent 50 and doing sbt and followng commands with calm. Fever coming down, abx d/c'd  12/10 - failed SBT, WBC up. Got low bp with lasix and given albumin. 1 unit prbc this AM  for hgb 6.9.  12/12 - weaned on 12/5 12/13 - chest tube fell out 12/14 extubated 12/14 rt thora 1050 cc clear   CULTURES: Blood 11/27 >>  (-) Urine 11/27 >> insignificant growth Blood 11/28 >> (-) Urine 11/28 >> (-) Viral resp panel 11/28 >> neg Pleural fluid 11/29>>>no orgs but abundant wbc BAL bacterial 12/4 >> negative BAL fungal 12/4 >> scant candida guilliermondii BAL afb12/4 >> BAL respiratory panel 12/4 > negative Pleural fluid 12/14>>  ANTIBIOTICS: Cefepime 11/27 >> 11/28 azithro 11/27 >> 12/2 vanco 11/28 >> 12/9 meropenem 11/28 >> 12/9  anidulafungin 11/30 >> 12/11 Acyclovir 12/1 >> 12/11     HPI/Recap of past 24 hours:  Sitting in chair, look weak, but does not seem in acute distress, she denies chest pain, no sob, on room air.no edema Husband at bedside  Assessment/Plan: Principal Problem:   HCAP (healthcare-associated pneumonia) Active Problems:   Pancytopenia ([place])   Hypokalemia   Dehydration   Pneumonia involving left lung   Severe sepsis (HCC)   Acute respiratory failure with hypoxemia (HCC)   Encephalopathy acute   S/P thoracentesis   Septic shock (H[facility])   Pleural effusion, left  Lobar pneumonia, bilateral pleural effusion, respiratory failure required intubation, bronchoscopy, bilateral thoracentesis and chest tube Patient presented with fever of 103, sinus tachycardia, cough, lethargy, she initially admitted to hospitalist service , she decompensated the next day and transferred to icu intubated. All culture including blood, sputum, urine, pleural fluids, bronch specimen unremarkable, except scant candida guilliermondii She received vanc/cefepime/meropenem/zithromax/anidulafungin/acyclovir initially due to immunosuppressed state with recent chemotherapy for AML. And she did have neurtropenia  from 11/29 to 12/9 She has improved, now off all abx, extubated, on room air, repeat cxr on 12/16 with bilateral airspace opacities, small effusions (  last thoracentesis was on [id number])  Acute systolic CHF, EKG with lBBB Patient denies prior cardiac history, no prior ekg or echo before this hospitalization. Acute chf from infection? From chemo? She denies chest pain, no edema,  Start coreg on 12/16, consider lisinopril if bp able to tolerate.  Consider prn lasix Consider cardiology consult  Hypokalemia: replace k  Pancytopenia:  She presented with pancytopenia wbc as low as 0.4, hgb aas low as  6.7, plt as low as 15,  Initial pancytopenia likely combination from chemo and infection She received prbc transfusion on 11/27, 11/30, 12/5, 12/10, 12/14 total of 6 units so far She received plt transfusionx3  on 11/29 Counts have now these has improved.   AML:  last chemo with decitabine on 11/21,  bone marrow on 12/12 no significant blast cells. Pleural fluids cytology negative for blast cells  bronch cytology negative for malignancy Wbc started to increase from 12/15, ldh seems slightly trending up as well  oncology following  Deconditioning: PT eval, may need snf  Code Status: DNR  Family Communication: patient and husband  Disposition Plan: pending   Consultants:  Critical care  oncology  Procedures:  See above  Antibiotics:  See above   Objective: BP (!) 153/60   Pulse 90   Temp 98.1 F (36.7 C) (Oral)   Resp 15   Ht 5\' 2"  (1.575 m)   Wt 59.9 kg (132 lb 0.9 oz)   SpO2 97%   BMI 24.15 kg/m   Intake/Output Summary (Last 24 hours) at 05/12/16 0807 Last data filed at 05/11/16 1037  Gross per 24 hour  Intake              315 ml  Output                0 ml  Net              315 ml   Filed Weights   05/10/16 0429 05/11/16 0345 05/12/16 0400  Weight: 64.6 kg (142 lb 6.7 oz) 63.4 kg (139 lb 12.4 oz) 59.9 kg (132 lb 0.9 oz)    Exam:   General:  Frail but NAD, on room air  Cardiovascular: RRR  Respiratory: diminished at basis, no wheezing, no rales, no rhonchi  Abdomen: Soft/ND/NT, positive  BS  Musculoskeletal: No Edema  Neuro: alert, interactive  Data Reviewed: Basic Metabolic Panel:  Recent Labs Lab 05/06/16 0444 05/07/16 0410 05/08/16 0345 05/09/16 0337 05/10/16 0451 05/11/16 0343 05/12/16 0546  NA  --   --  140 139 140 138 140  K  --   --  3.1* 3.2* 3.2* 3.3* 2.8*  CL  --   --  100* 101 101 101 101  CO2  --   --  35* 33* 35* 30 27  GLUCOSE  --   --  150* 147* 126* 116* 102*  BUN  --   --  19 22* 21* 15 9  CREATININE  --   --  0.36* 0.37* 0.35* 0.39* 0.48  CALCIUM  --   --  7.8* 7.9* 8.2* 7.8* 7.9*  MG 1.9 2.0 1.9 1.8 1.8  --  2.5*  PHOS 2.7 3.1 3.6 3.7  --   --  3.3   Liver Function Tests:  Recent Labs Lab 05/06/16 0444 05/10/16 1147 05/11/16 0343  AST 18  --  26  ALT 12*  --  20  ALKPHOS 97  --  116  BILITOT 1.0  --  1.2  PROT 4.6* 5.6* 5.3*  ALBUMIN 1.8*  --  2.3*   No results for input(s): LIPASE, AMYLASE in the last 168 hours. No results for input(s): AMMONIA in the last 168 hours. CBC:  Recent Labs Lab 05/06/16 0444 05/07/16 0410 05/08/16 0345 05/09/16 0337 05/10/16 0451 05/10/16 1756 05/11/16 0343 05/12/16 0453  WBC 3.2* 8.0 8.1 7.1 9.9  --  16.7* 14.9*  NEUTROABS 1.8 6.6 6.0 4.9  --   --   --   --   HGB 6.9* 8.6* 8.1* 7.6* 7.7* 10.4* 9.6* 9.9*  HCT 21.3* 26.1* 25.0* 23.2* 23.3* 32.0* 29.3* 29.3*  MCV 99.1 94.2 95.1 92.4 94.0  --  90.2 89.6  PLT 197 196 184 174 197  --  286 314   Cardiac Enzymes:   No results for input(s): CKTOTAL, CKMB, CKMBINDEX, TROPONINI in the last 168 hours. BNP (last 3 results) No results for input(s): BNP in the last 8760 hours.  ProBNP (last 3 results) No results for input(s): PROBNP in the last 8760 hours.  CBG:  Recent Labs Lab 05/11/16 1508 05/11/16 2016 05/11/16 2334 05/12/16 0337 05/12/16 0734  GLUCAP 96 100* 92 100* 92    Recent Results (from the past 240 hour(s))  Body fluid culture     Status: None (Preliminary result)   Collection Time: 05/10/16 10:30 AM  Result Value Ref  Range Status   Specimen Description PLEURAL  Final   Special Requests NONE  Final   Gram Stain   Final    FEW WBC PRESENT, PREDOMINANTLY PMN NO ORGANISMS SEEN    Culture   Final    NO GROWTH 1 DAY Performed at [facility]    Report Status PENDING  Incomplete     Studies: Dg Chest Port 1 View  Result Date: 05/12/2016 CLINICAL DATA:  Respiratory failure, hypertension EXAM: PORTABLE CHEST 1 VIEW COMPARISON:  05/11/2016 FINDINGS: Right Port-A-Cath and left central line remain in place, unchanged. Airspace disease in the left lower lobe again noted with small left pleural effusion. Heart is normal size. Patchy airspace disease in the right upper lobe and right lung base. IMPRESSION: Bilateral airspace opacities are again noted. Small left effusion. No real change. Electronically Signed   By: Rolm Baptise M.D.   On: 05/12/2016 07:19    Scheduled Meds: . alteplase  4 mg Intracatheter Once  . atorvastatin  10 mg Per Tube QHS  . famotidine (PEPCID) IV  20 mg Intravenous Q12H  . insulin aspart  0-15 Units Subcutaneous Q4H  . mouth rinse  15 mL Mouth Rinse BID  . multivitamin  15 mL Per Tube Daily  . potassium chloride  40 mEq Oral Q4H  . sodium chloride  500 mL Intravenous Once  . sodium chloride flush  10-40 mL Intracatheter Q12H  . temazepam  7.5 mg Oral Once    Continuous Infusions:   Time spent: 67mins  Evelyn Moch MD, PhD  [facility] Pager [phone]. If 7PM-7AM, please contact night-coverage at [email or web address], password [facility] 05/12/2016, 8:07 AM  LOS: 19 days

## 2016-05-12 NOTE — Evaluation (Signed)
Physical Therapy Evaluation Patient Details Name: Kristina Meyer MRN: [id number] DOB: [birth year] Today's Date: 05/12/2016   History of Present Illness  75 yo woman with hx of HTN and AML receiving decitabine; adm [facility] on 04/23/16 with c/o progressive weakness, lethargy, fever, and malaise; Pt found to have LLL pna (HCAP), L pleural effusion, transferred  to SDU on 11/28 d/t tachycardia and worsening dyspnea, intubated 11/29, ext 12/14, s/p thoracentesis-chest tube 11/30-12/13; has some episodes of confusion per chart, referred to PT d/t significant deconditioning;  Clinical Impression  Pt admitted with above diagnosis. Pt currently with functional limitations due to the deficits listed below (see PT Problem List).  Pt will benefit from skilled PT to increase their independence and safety with mobility to allow discharge to the venue listed below.   Pt is cooperative, certainly needs continued PT in acute setting d/t significant global deconditioning/extended time on vent;  May need ST SNF post acute depending on progress     Follow Up Recommendations SNF;Home health PT (depending on acute LOS/progress)    Equipment Recommendations  Rolling walker with 5" wheels    Recommendations for Other Services       Precautions / Restrictions Precautions Precautions: Fall Restrictions Weight Bearing Restrictions: No      Mobility  Bed Mobility Overal bed mobility: Needs Assistance Bed Mobility: Supine to Sit;Sit to Supine     Supine to sit: Mod assist;Max assist Sit to supine: Mod assist;+2 for safety/equipment;Max assist   General bed mobility comments: assist with trunk and LEs, requires multi-modal cues for technique and participation  Transfers Overall transfer level: Needs assistance Equipment used: Rolling walker (2 wheeled) Transfers: Sit to/from Stand Sit to Stand: Mod assist;+2 physical assistance;Max assist;+2 safety/equipment         General transfer comment: 3 standing trials  for instruction and activity tol; pt requires multi-modal cues for safe technique and hand placement; with posterior bias in standing, only  minimally able to correct with cues  Ambulation/Gait                Stairs            Wheelchair Mobility    Modified Rankin (Stroke Patients Only)       Balance Overall balance assessment: Needs assistance   Sitting balance-Leahy Scale: Poor Sitting balance - Comments: able to briefly maintain midline after standing trials Postural control: Posterior lean   Standing balance-Leahy Scale: Zero Standing balance comment: posterior bias                             Pertinent Vitals/Pain Pain Assessment: No/denies pain    Home Living Family/patient expects to be discharged to:: Private residence Living Arrangements: Spouse/significant other Available Help at Discharge: Available 24 hours/day;Family Type of Home: House Home Access: Stairs to enter Entrance Stairs-Rails: Right Entrance Stairs-Number of Steps: 4 Home Layout: Two level Home Equipment: Cane - single point;Shower seat      Prior Function Level of Independence: Independent               Hand Dominance        Extremity/Trunk Assessment   Upper Extremity Assessment Upper Extremity Assessment: Generalized weakness    Lower Extremity Assessment Lower Extremity Assessment: Generalized weakness (RLE weaker than LLE)       Communication   Communication: HOH;No difficulties  Cognition Arousal/Alertness: Awake/alert Behavior During Therapy: WFL for tasks assessed/performed Overall Cognitive Status: Impaired/Different from baseline Area of  Impairment: Following commands;Problem solving       Following Commands: Follows one step commands with increased time;Follows multi-step commands inconsistently     Problem Solving: Slow processing;Decreased initiation;Requires verbal cues;Requires tactile cues General Comments: pt is alert, she is  oriented x3 but very slow to respond, pt family answers most questions regarding home environment/PLOF    General Comments      Exercises     Assessment/Plan    PT Assessment Patient needs continued PT services  PT Problem List Decreased strength;Decreased activity tolerance;Decreased balance;Decreased mobility;Decreased knowledge of use of DME;Decreased range of motion          PT Treatment Interventions DME instruction;Gait training;Functional mobility training;Therapeutic activities;Therapeutic exercise;Balance training;Patient/family education    PT Goals (Current goals can be found in the Care Plan section)  Acute Rehab PT Goals Patient Stated Goal: none stated PT Goal Formulation: With patient Time For Goal Achievement: 05/26/16 Potential to Achieve Goals: Good    Frequency Min 3X/week   Barriers to discharge        Co-evaluation               End of Session Equipment Utilized During Treatment: Gait belt Activity Tolerance: Patient limited by fatigue;Patient tolerated treatment well Patient left: in bed;with call bell/phone within reach;with bed alarm set;with family/visitor present           Time: [id number] PT Time Calculation (min) (ACUTE ONLY): 24 min   Charges:   PT Evaluation $PT Eval Moderate Complexity: 1 Procedure PT Treatments $Therapeutic Activity: 8-22 mins   PT G Codes:        Kristina Meyer 2016/05/19, 2:05 PM

## 2016-05-12 NOTE — Progress Notes (Signed)
PHARMACIST - PHYSICIAN COMMUNICATION  DR:   TRH CONCERNING: IV to Oral Route Change Policy  RECOMMENDATION: This patient is receiving famotidine by the intravenous route.  Based on criteria approved by the Pharmacy and Therapeutics Committee, the intravenous medication(s) is/are being converted to the equivalent oral dose form(s).   DESCRIPTION: These criteria include:  The patient is eating (either orally or via tube) and/or has been taking other orally administered medications for a least 24 hours  The patient has no evidence of active gastrointestinal bleeding or impaired GI absorption (gastrectomy, short bowel, patient on TNA or NPO).  If you have questions about this conversion, please contact the Pharmacy Department  []   [phone] )  Kristina Meyer []   [phone] )  Kristina Meyer []   [phone] )  Kristina Meyer []   [phone] )  Kristina Meyer [x]   [phone] )  Kristina Meyer, Kristina Meyer, Kristina Meyer 05/12/2016 12:22 PM

## 2016-05-12 NOTE — Progress Notes (Signed)
   RN calling emD  C/. Insomnia x daily x 3 night. Now mild confusion as well QTc on monitor 530msec and also 05/05/16 ekg 535msec Mag < 2gm 05/10/16 and not replaced   Recent Labs Lab 05/05/16 0511 05/06/16 0011 05/06/16 0444 05/07/16 0410 05/08/16 0345 05/09/16 0337 05/10/16 0451 05/11/16 0343  NA 141 140  --   --  140 139 140 138  K 3.6 3.5  --   --  3.1* 3.2* 3.2* 3.3*  CL 101 99*  --   --  100* 101 101 101  CO2 36* 36*  --   --  35* 33* 35* 30  GLUCOSE 147* 141*  --   --  150* 147* 126* 116*  BUN 22* 21*  --   --  19 22* 21* 15  CREATININE <0.30* 0.38*  --   --  0.36* 0.37* 0.35* 0.39*  CALCIUM 7.8* 7.9*  --   --  7.8* 7.9* 8.2* 7.8*  MG 1.8  --  1.9 2.0 1.9 1.8 1.8  --   PHOS 3.0  --  2.7 3.1 3.6 3.7  --   --    Plan restoril for sleep Hold off haldol Replete mag chjeck 12 lead ekg  Dr. Brand Males, M.D., [facility].C.P Pulmonary and Critical Care Medicine Staff Physician [facility] Pager: [phone], If no answer or between  15:00h - 7:00h: call [phone]  05/12/2016 1:13 AM

## 2016-05-12 NOTE — Progress Notes (Signed)
SLP Cancellation Note  Patient Details Name: Kristina Meyer MRN: [id number] DOB: [birth year]   Cancelled treatment:       Reason Eval/Treat Not Completed: Other (comment) (Called to check on pt progress and nursing reports no issues with current diet of Regular/thin with slow intake).   ADAMS,PAT, M.S.,CCC-SLP 05/12/2016, 2:26 PM

## 2016-05-13 LAB — BASIC METABOLIC PANEL
Anion gap: 6 (ref 5–15)
BUN: 8 mg/dL (ref 6–20)
CALCIUM: 7.7 mg/dL — AB (ref 8.9–10.3)
CO2: 26 mmol/L (ref 22–32)
CREATININE: 0.43 mg/dL — AB (ref 0.44–1.00)
Chloride: 105 mmol/L (ref 101–111)
GFR calc Af Amer: >60 mL/min (ref >60)
GLUCOSE: 101 mg/dL — AB (ref 65–99)
Potassium: 3.2 mmol/L — ABNORMAL LOW (ref 3.5–5.1)
Sodium: 137 mmol/L (ref 135–145)

## 2016-05-13 LAB — LIPID PANEL
Cholesterol: 126 mg/dL (ref 0–200)
HDL: 38 mg/dL — ABNORMAL LOW (ref >40)
LDL CALC: 61 mg/dL (ref 0–99)
Total CHOL/HDL Ratio: 3.3 RATIO
Triglycerides: 134 mg/dL (ref <150)
VLDL: 27 mg/dL (ref 0–40)

## 2016-05-13 LAB — CBC WITH DIFFERENTIAL/PLATELET
Basophils Absolute: 0.1 10*3/uL (ref 0.0–0.1)
Basophils Relative: 1 %
EOS PCT: 0 %
Eosinophils Absolute: 0 10*3/uL (ref 0.0–0.7)
HCT: 30.6 % — ABNORMAL LOW (ref 36.0–46.0)
Hemoglobin: 10.2 g/dL — ABNORMAL LOW (ref 12.0–15.0)
LYMPHS ABS: 1.9 10*3/uL (ref 0.7–4.0)
Lymphocytes Relative: 18 %
MCH: 29.9 pg (ref 26.0–34.0)
MCHC: 33.3 g/dL (ref 30.0–36.0)
MCV: 89.7 fL (ref 78.0–100.0)
MONO ABS: 1 10*3/uL (ref 0.1–1.0)
Monocytes Relative: 9 %
NEUTROS ABS: 7.7 10*3/uL (ref 1.7–7.7)
Neutrophils Relative %: 72 %
PLATELETS: 357 10*3/uL (ref 150–400)
RBC: 3.41 MIL/uL — AB (ref 3.87–5.11)
RDW: 22.2 % — AB (ref 11.5–15.5)
WBC: 10.7 10*3/uL — AB (ref 4.0–10.5)

## 2016-05-13 LAB — GLUCOSE, CAPILLARY
GLUCOSE-CAPILLARY: 108 mg/dL — AB (ref 65–99)
GLUCOSE-CAPILLARY: 109 mg/dL — AB (ref 65–99)
GLUCOSE-CAPILLARY: 92 mg/dL (ref 65–99)
Glucose-Capillary: 105 mg/dL — ABNORMAL HIGH (ref 65–99)
Glucose-Capillary: 104 mg/dL — ABNORMAL HIGH (ref 65–99)
Glucose-Capillary: 112 mg/dL — ABNORMAL HIGH (ref 65–99)

## 2016-05-13 LAB — LACTATE DEHYDROGENASE: LDH: 241 U/L — AB (ref 98–192)

## 2016-05-13 MED ORDER — HYDROCORTISONE 2.5 % RE CREA
TOPICAL_CREAM | Freq: Two times a day (BID) | RECTAL | Status: DC
  Administered 2016-05-13: 1 via RECTAL
  Administered 2016-05-14 – 2016-05-16 (×5): via RECTAL
  Filled 2016-05-13: qty 28.35

## 2016-05-13 MED ORDER — POTASSIUM CHLORIDE CRYS ER 20 MEQ PO TBCR
40.0000 meq | EXTENDED_RELEASE_TABLET | ORAL | Status: AC
  Administered 2016-05-13 (×2): 40 meq via ORAL
  Filled 2016-05-13 (×2): qty 2

## 2016-05-13 MED ORDER — LISINOPRIL 2.5 MG PO TABS: 2.5000 mg | ORAL_TABLET | Freq: Every day | ORAL | Status: DC

## 2016-05-13 NOTE — Progress Notes (Signed)
PROGRESS NOTE  Kristina Meyer [id number] DOB: [birth year] DOA: 04/23/2016 PCP: Jobe Marker, MD  Brief summary:  75 yo woman with HTN and AML receiving decitabine, last dose 1 weeks ago prior to this hospitalization. Reported progressive weakness, lethargy, fever 103, and malaise. Also developed a non-productive cough. Presented for eval that revealed LLL infiltrate.  She is admitted to hospitalist service on 11/27,.  Flu screen, pneumococcal PNA, legionella PNA screens negative.  Hgb was 6.7, received PRBC. She started cefepime and azithro. All cx data so far negative.   On 11/28 has continued to have fever, WBC dropping,she became neutropenia,  has had some intermittent episodes of hypotension. She received IVF and abx were changed to vanco + imipenem. She has been progressively weak and now moves to SDU. PCCM consulted to eval , patient has significant pleural effusion, developed worsening respiratory status, she had bedside thoracentesis on 11/29,   she continue deteriorate and was then intubated on 11/30, she was put on pressors due to hypotension. She was admitted to ICU. She underwent bronchoscopy on 12/4, she has chest tube placed, she eventually showed improvement and was extubated on 12/14, patient code status changed to DNR.  Patient is transferred back to hospitalist service on 12/16.   LINES/TUBES: Port-s-cath >>  OETT 11/29 >>12/14 Left IJ CVL 11/29 >> Chest tube (L) 11/30 >> 12/13  SIGNIFICANT EVENTS: 11/29 - thora. Incomplete drainage of left pleural space 11/30 - Left Chest tube placed.  12/04 - Bronch 12/08 - Required IVF for low bp. Lasix held 12/09 - new Rt distal forearm redness x few days. Not on pressors. Failed SBT yesterday after 3h with agitation/desaturation /tacycardia. Currently on fent 50 and doing sbt and followng commands with calm. Fever coming down, abx d/c'd  12/10 - failed SBT, WBC up. Got low bp with lasix and given albumin. 1 unit prbc this AM  for hgb 6.9.  12/12 - weaned on 12/5 12/13 - chest tube fell out 12/14 extubated 12/14 rt thora 1050 cc clear   CULTURES: Blood 11/27 >>  (-) Urine 11/27 >> insignificant growth Blood 11/28 >> (-) Urine 11/28 >> (-) Viral resp panel 11/28 >> neg Pleural fluid 11/29>>>no orgs but abundant wbc BAL bacterial 12/4 >> negative BAL fungal 12/4 >> scant candida guilliermondii BAL afb12/4 >> BAL respiratory panel 12/4 > negative Pleural fluid 12/14>>  ANTIBIOTICS: Cefepime 11/27 >> 11/28 azithro 11/27 >> 12/2 vanco 11/28 >> 12/9 meropenem 11/28 >> 12/9  anidulafungin 11/30 >> 12/11 Acyclovir 12/1 >> 12/11     HPI/Recap of past 24 hours:  C/o hemorroids,  she does not seem in acute distress, she denies chest pain, no sob, on room air.no edema   Assessment/Plan: Principal Problem:   HCAP (healthcare-associated pneumonia) Active Problems:   Pancytopenia (HCC)   Hypokalemia   Dehydration   Pneumonia involving left lung   Severe sepsis (HCC)   Acute respiratory failure with hypoxemia (HCC)   Encephalopathy acute   S/P thoracentesis   Septic shock (HCC)   Pleural effusion, left  Lobar pneumonia, bilateral pleural effusion, respiratory failure required intubation, bronchoscopy, bilateral thoracentesis and chest tube Patient presented with fever of 103, sinus tachycardia, cough, lethargy, she initially admitted to hospitalist service , she decompensated the next day and transferred to icu intubated. All culture including blood, sputum, urine, pleural fluids, bronch specimen unremarkable, except scant candida guilliermondii She received vanc/cefepime/meropenem/zithromax/anidulafungin/acyclovir initially due to immunosuppressed state with recent chemotherapy for AML. And she did have neurtropenia from 11/29 to 12/9  She has improved, now off all abx, extubated, on room air, repeat cxr on 12/16 with bilateral airspace opacities, small effusions ( last thoracentesis was on  [id number])  Acute systolic CHF, EKG with lBBB Patient denies prior cardiac history, no prior ekg or echo before this hospitalization. Echo this hospitalization LVEF35% Acute chf from infection? From chemo? She denies chest pain, no edema,  Start coreg on 12/16, consider lisinopril if bp able to tolerate.  Consider prn lasix Likely not a candidate for invasive testing or intervention due to AML, Consider cardiology consult.  Hypokalemia: replace k daily  Pancytopenia:  She presented with pancytopenia wbc as low as 0.4, hgb aas low as  6.7, plt as low as 15,  Initial pancytopenia likely combination from chemo and infection She received prbc transfusion on 11/27, 11/30, 12/5, 12/10, 12/14 total of 6 units so far She received plt transfusionx3  on 11/29 Counts have now improved.   AML:  last chemo with decitabine on 11/21,  bone marrow on 12/12 no significant blast cells. Pleural fluids cytology negative for blast cells  bronch cytology negative for malignancy Wbc started to increase from 12/15, ldh seems slightly trending up as well  oncology following  Deconditioning: PT eval, may need snf  Code Status: DNR  Family Communication: patient and husband  Disposition Plan: likely SNF early next week   Consultants:  Critical care  oncology  Procedures:  See above  Antibiotics:  See above   Objective: BP (!) 157/63 (BP Location: Right Arm)   Pulse 87   Temp 98.5 F (36.9 C) (Oral)   Resp (!) 21   Ht 5\' 2"  (1.575 m)   Wt 60.5 kg (133 lb 6.1 oz)   SpO2 98%   BMI 24.40 kg/m   Intake/Output Summary (Last 24 hours) at 05/13/16 1020 Last data filed at 05/13/16 0230  Gross per 24 hour  Intake              181 ml  Output                0 ml  Net              181 ml   Filed Weights   05/12/16 0400 05/13/16 0038 05/13/16 0500  Weight: 59.9 kg (132 lb 0.9 oz) 58.4 kg (128 lb 12 oz) 60.5 kg (133 lb 6.1 oz)    Exam:   General:  Frail but NAD, on room  air  Cardiovascular: RRR  Respiratory: diminished at basis, no wheezing, no rales, no rhonchi  Abdomen: Soft/ND/NT, positive BS  Musculoskeletal: No Edema  Neuro: aaox3  Data Reviewed: Basic Metabolic Panel:  Recent Labs Lab 05/07/16 0410  05/08/16 0345 05/09/16 [id number] 05/10/16 0451 05/11/16 0343 05/12/16 0546 05/13/16 0514  NA  --   < > 140 139 140 138 140 137  K  --   < > 3.1* 3.2* 3.2* 3.3* 2.8* 3.2*  CL  --   < > 100* 101 101 101 101 105  CO2  --   < > 35* 33* 35* 30 27 26   GLUCOSE  --   < > 150* 147* 126* 116* 102* 101*  BUN  --   < > 19 22* 21* 15 9 8   CREATININE  --   < > 0.36* 0.37* 0.35* 0.39* 0.48 0.43*  CALCIUM  --   < > 7.8* 7.9* 8.2* 7.8* 7.9* 7.7*  MG 2.0  --  1.9 1.8 1.8  --  2.5*  --  PHOS 3.1  --  3.6 3.7  --   --  3.3  --   < > = values in this interval not displayed. Liver Function Tests:  Recent Labs Lab 05/10/16 1147 05/11/16 0343  AST  --  26  ALT  --  20  ALKPHOS  --  116  BILITOT  --  1.2  PROT 5.6* 5.3*  ALBUMIN  --  2.3*   No results for input(s): LIPASE, AMYLASE in the last 168 hours. No results for input(s): AMMONIA in the last 168 hours. CBC:  Recent Labs Lab 05/07/16 0410 05/08/16 0345 05/09/16 0337 05/10/16 0451 05/10/16 1756 05/11/16 0343 05/12/16 0453 05/13/16 0514  WBC 8.0 8.1 7.1 9.9  --  16.7* 14.9* 10.7*  NEUTROABS 6.6 6.0 4.9  --   --   --   --  7.7  HGB 8.6* 8.1* 7.6* 7.7* 10.4* 9.6* 9.9* 10.2*  HCT 26.1* 25.0* 23.2* 23.3* 32.0* 29.3* 29.3* 30.6*  MCV 94.2 95.1 92.4 94.0  --  90.2 89.6 89.7  PLT 196 184 174 197  --  286 314 357   Cardiac Enzymes:   No results for input(s): CKTOTAL, CKMB, CKMBINDEX, TROPONINI in the last 168 hours. BNP (last 3 results) No results for input(s): BNP in the last 8760 hours.  ProBNP (last 3 results) No results for input(s): PROBNP in the last 8760 hours.  CBG:  Recent Labs Lab 05/12/16 1619 05/12/16 2026 05/13/16 0013 05/13/16 0416 05/13/16 0739  GLUCAP 109* 105* 92  105* 109*    Recent Results (from the past 240 hour(s))  Body fluid culture     Status: None (Preliminary result)   Collection Time: 05/10/16 10:30 AM  Result Value Ref Range Status   Specimen Description PLEURAL  Final   Special Requests NONE  Final   Gram Stain   Final    FEW WBC PRESENT, PREDOMINANTLY PMN NO ORGANISMS SEEN    Culture   Final    NO GROWTH 2 DAYS Performed at [facility]    Report Status PENDING  Incomplete     Studies: No results found.  Scheduled Meds: . alteplase  4 mg Intracatheter Once  . atorvastatin  10 mg Per Tube QHS  . carvedilol  3.125 mg Oral BID WC  . famotidine  20 mg Oral BID  . insulin aspart  0-15 Units Subcutaneous Q4H  . mouth rinse  15 mL Mouth Rinse BID  . multivitamin with minerals  1 tablet Oral Daily  . potassium chloride  40 mEq Oral Q4H  . sodium chloride  500 mL Intravenous Once  . sodium chloride flush  10-40 mL Intracatheter Q12H  . temazepam  7.5 mg Oral Once    Continuous Infusions:   Time spent: 74mins  Cybele Maule MD, PhD  [facility] Pager [phone]. If 7PM-7AM, please contact night-coverage at [email or web address], password [facility] 05/13/2016, 10:20 AM  LOS: 20 days

## 2016-05-14 ENCOUNTER — Encounter (HOSPITAL_COMMUNITY): Payer: Self-pay | Admitting: Student

## 2016-05-14 ENCOUNTER — Inpatient Hospital Stay (HOSPITAL_COMMUNITY): Payer: Medicare Other

## 2016-05-14 DIAGNOSIS — I5021 Acute systolic (congestive) heart failure: Secondary | ICD-10-CM

## 2016-05-14 LAB — TYPE AND SCREEN
ABO/RH(D): A POS
ANTIBODY SCREEN: NEGATIVE
Unit division: 0
Unit division: 0

## 2016-05-14 LAB — BODY FLUID CULTURE: CULTURE: NO GROWTH

## 2016-05-14 LAB — BASIC METABOLIC PANEL
Anion gap: 9 (ref 5–15)
BUN: 9 mg/dL (ref 6–20)
CALCIUM: 7.7 mg/dL — AB (ref 8.9–10.3)
CO2: 24 mmol/L (ref 22–32)
CREATININE: 0.53 mg/dL (ref 0.44–1.00)
Chloride: 106 mmol/L (ref 101–111)
GFR calc Af Amer: >60 mL/min (ref >60)
GFR calc non Af Amer: >60 mL/min (ref >60)
GLUCOSE: 109 mg/dL — AB (ref 65–99)
Potassium: 4.3 mmol/L (ref 3.5–5.1)
Sodium: 139 mmol/L (ref 135–145)

## 2016-05-14 LAB — CBC WITH DIFFERENTIAL/PLATELET
BASOS PCT: 1 %
Basophils Absolute: 0.1 10*3/uL (ref 0.0–0.1)
EOS ABS: 0 10*3/uL (ref 0.0–0.7)
Eosinophils Relative: 0 %
HCT: 32.5 % — ABNORMAL LOW (ref 36.0–46.0)
HEMOGLOBIN: 10.7 g/dL — AB (ref 12.0–15.0)
LYMPHS PCT: 25 %
Lymphs Abs: 2.1 10*3/uL (ref 0.7–4.0)
MCH: 29.6 pg (ref 26.0–34.0)
MCHC: 32.9 g/dL (ref 30.0–36.0)
MCV: 90 fL (ref 78.0–100.0)
MONO ABS: 1.1 10*3/uL — AB (ref 0.1–1.0)
Monocytes Relative: 13 %
NEUTROS ABS: 5 10*3/uL (ref 1.7–7.7)
NEUTROS PCT: 61 %
Platelets: 396 10*3/uL (ref 150–400)
RBC: 3.61 MIL/uL — ABNORMAL LOW (ref 3.87–5.11)
RDW: 21.9 % — ABNORMAL HIGH (ref 11.5–15.5)
WBC: 8.3 10*3/uL (ref 4.0–10.5)

## 2016-05-14 LAB — LACTATE DEHYDROGENASE: LDH: 262 U/L — AB (ref 98–192)

## 2016-05-14 LAB — CHROMOSOME ANALYSIS, BONE MARROW

## 2016-05-14 MED ORDER — LISINOPRIL 10 MG PO TABS
5.0000 mg | ORAL_TABLET | Freq: Every day | ORAL | Status: DC
Start: 2016-05-14 — End: 2016-05-16
  Administered 2016-05-14 – 2016-05-16 (×3): 5 mg via ORAL
  Filled 2016-05-14: qty 1
  Filled 2016-05-14: qty 2
  Filled 2016-05-14: qty 1

## 2016-05-14 MED ORDER — SPIRONOLACTONE 25 MG PO TABS
12.5000 mg | ORAL_TABLET | Freq: Every day | ORAL | Status: DC
  Administered 2016-05-14 – 2016-05-16 (×3): 12.5 mg via ORAL
  Filled 2016-05-14 (×3): qty 1

## 2016-05-14 NOTE — Consult Note (Signed)
Cardiology Consult    Patient ID: Kristina Meyer MRN: [id number], DOB/AGE: 77-May-1942   Admit date: 04/23/2016 Date of Consult: 05/14/2016  Primary Physician: Jobe Marker, MD Reason for Consult: CHF Primary Cardiologist: New to [facility] Requesting Provider: Dr. Erlinda Hong   History of Present Illness    Kristina Meyer is a 75 y.o. 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   She was admitted and started on broad-spectrum antibiotics. Went into septic shock requiring intubation on 11/30. Pressors were utilized during this time frame as well. She underwent drainage of loculated left pleural effusion on 12/1. Developed a right pleural effusion, s/p thoracentesis on 12/14 and was able to be extubated on the same day.   Cardiology is asked to see today in regards to her CHF. An echocardiogram was obtained on 04/29/2016 and showed a reduced EF of 35% with Grade 2 DD. Severe hypokinesis of the mid-apical anterior, mid anteroseptal, mid inferoseptal, apical septal, and apical myocardium were noted with moderate hypokinesis of the apical inferior and apical lateral myocardium. Mild to moderate MR was noted as well.   Labs today show a K+ of 4.3 and creatinine 0.53. WBC of 8.3, Hgb 10.7, and platelets 396. Has been pancytopenic this admission requiring transfusions. Trough Hgb of 6.7 on 11/27 platelets at 15K on 04/25/2016.  In talking with the patient today, she denies any chest discomfort or recent dyspnea with exertion. 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 Has never been seen by a Cardiologist. No history of MI's or known CAD.   Does report a family history of CAD with her father having at [place] at age 77. Use to smoke cigarettes but quit 50+ years ago. No history of excessive alcohol use.    Past  Medical History   Past Medical History:  Diagnosis Date  . 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 Anxiety   . Heart murmur    echo done 20 yrs ago  normal  . Hypertension   . PONV (postoperative nausea and vomiting)    req patch    Past Surgical History:  Procedure Laterality Date  . BREAST CYST EXCISION Left 1970  . BREAST SURGERY Left 76   lump   . EYE SURGERY Bilateral 09   cataracts  . HEMORRHOID SURGERY  85  . IR GENERIC HISTORICAL  01/11/2016   IR US GUIDE VASC ACCESS RIGHT 01/11/2016 Jacqulynn Cadet, MD [facility]-INTERV RAD  . IR GENERIC HISTORICAL  01/11/2016   IR FLUORO GUIDE CV LINE RIGHT 01/11/2016 Jacqulynn Cadet, MD [facility]  . LUMBAR LAMINECTOMY/DECOMPRESSION MICRODISCECTOMY Right 12/02/2013   Procedure: L5-S1 RIGHT DISCECTOMY;  Surgeon: Melina Schools, MD;  Location: [facility];  Service: Orthopedics;  Laterality: Right;     Allergies  Allergies  Allergen Reactions  . Other Other (See Comments)    GENERAL Anesthesia, vomiting    Inpatient Medications    . alteplase  4 mg Intracatheter Once  . atorvastatin  10 mg Per Tube QHS  . carvedilol  3.125 mg Oral BID WC  . famotidine  20 mg Oral BID  . hydrocortisone   Rectal BID  . lisinopril  5 mg Oral Daily  . multivitamin with minerals  1 tablet Oral Daily  . sodium chloride  500 mL Intravenous Once  .  sodium chloride flush  10-40 mL Intracatheter Q12H  . spironolactone  12.5 mg Oral Daily  . temazepam  7.5 mg Oral Once    Family History    Family History  Problem Relation Age of Onset  . Heart failure Mother   . Depression Mother   . Heart attack Father 62  . Leukemia Cousin     Social History    Social History   Social History  . Marital status: Married    Spouse name: N/A  . Number of children: N/A  . Years of education: N/A   Occupational History  . Not on file.   Social History Main Topics  . Smoking status: Former Smoker    Packs/day: 0.50    Years: 5.00    Types:  Cigarettes    Quit date: 12/02/1966  . Smokeless tobacco: Never Used  . Alcohol use No  . Drug use: No  . Sexual activity: Not on file   Other Topics Concern  . Not on file   Social History Narrative  . No narrative on file     Review of Systems    General:  No chills,  night sweats or weight changes. Positive for fevers and generalized weakness.   Cardiovascular:  No chest pain, dyspnea on exertion, edema, orthopnea, palpitations, paroxysmal nocturnal dyspnea. Dermatological: No rash, lesions/masses Respiratory: No cough, dyspnea Urologic: No hematuria, dysuria Abdominal:   No nausea, vomiting, diarrhea, bright red blood per rectum, melena, or hematemesis Neurologic:  No visual changes, wkns, changes in mental status. All other systems reviewed and are otherwise negative except as noted above.  Physical Exam    Blood pressure 133/76, pulse 79, temperature 98 F (36.7 C), temperature source Oral, resp. rate 18, height 5\' 2"  (1.575 m), weight 122 lb (55.3 kg), SpO2 100 %.  General: Pleasant, Caucasian female appearing in NAD. Psych: Normal affect. Neuro: Alert and oriented X 3. Moves all extremities spontaneously. HEENT: Normal  Neck: Supple without bruits or JVD. Lungs:  Resp regular and unlabored, CTA without wheezing or rales. Heart: RRR no s3, s4, 2/6 SEM at [place]. Abdomen: Soft, non-tender, non-distended, BS + x 4.  Extremities: No clubbing, cyanosis or edema. DP/PT/Radials 2+ and equal bilaterally.  Labs    Troponin (Point of Care Test) No results for input(s): TROPIPOC in the last 72 hours. No results for input(s): CKTOTAL, CKMB, TROPONINI in the last 72 hours. Lab Results  Component Value Date   WBC 8.3 05/14/2016   HGB 10.7 (L) 05/14/2016   HCT 32.5 (L) 05/14/2016   MCV 90.0 05/14/2016   PLT 396 05/14/2016    Recent Labs Lab 05/11/16 0343  05/14/16 0316  NA 138  < > 139  K 3.3*  < > 4.3  CL 101  < > 106  CO2 30  < > 24  BUN 15  < > 9  CREATININE 0.39*   < > 0.53  CALCIUM 7.8*  < > 7.7*  PROT 5.3*  --   --   BILITOT 1.2  --   --   ALKPHOS 116  --   --   ALT 20  --   --   AST 26  --   --   GLUCOSE 116*  < > 109*  < > = values in this interval not displayed. Lab Results  Component Value Date   CHOL 126 05/13/2016   HDL 38 (L) 05/13/2016   LDLCALC 61 05/13/2016   TRIG 134 05/13/2016   No  results found for: DDIMER   Radiology Studies    Dg Chest 1 View  Result Date: 05/09/2016 CLINICAL DATA:  75 year old female with history of a a female and heart murmur and hypertension. Patient's ET tube was pulled. Evaluate for positioning. EXAM: CHEST 1 VIEW COMPARISON:  Earlier Chest radiograph dated 05/09/2016 FINDINGS: Endotracheal tube approximately 4.5 cm above the carina in appropriate positioning. Left IJ central line, enteric tube, and right-sided Port-A-Cath remains in similar position. Small bilateral pleural effusions. Bilateral airspace disease, right there are left appear similar or minimally worsened compared to the earlier exam. There is no pneumothorax. The cardiac silhouette is within normal limits. No acute osseous pathology. IMPRESSION: Endotracheal tube above the carina in appropriate positioning. Other support devices in stable position as the prior radiograph. Small bilateral pleural effusions and bilateral airspace disease, right greater left. Electronically Signed   By: Anner Crete M.D.   On: 05/09/2016 22:03   Dg Chest 2 View  Result Date: 04/24/2016 CLINICAL DATA:  Followup pneumonia. Left-sided chest pain and fever. EXAM: CHEST  2 VIEW COMPARISON:  04/23/2016 FINDINGS: Power port appears unchanged. Right lung remains clear. There is progressive infiltrate throughout the left lower lobe with only mild volume loss. No acute bone finding. IMPRESSION: Left lower lobe pneumonia is progressive since yesterday's film. Electronically Signed   By: Nelson Chimes M.D.   On: 04/24/2016 09:08   Dg Chest 2 View  Result Date:  04/23/2016 CLINICAL DATA:  Constant generalized body aches for 3 days. Vomiting, appetite change, weakness, fatigue, and fever. Mild cough. EXAM: CHEST  2 VIEW COMPARISON:  None. FINDINGS: Normal heart size and pulmonary vascularity. Consolidation in the left lung base behind the heart likely representing pneumonia. Right lung is clear. Power port type central venous catheter with tip over the low SVC region. No pneumothorax. Degenerative changes in the spine. IMPRESSION: Consolidation in the left lung base likely representing pneumonia. Electronically Signed   By: Lucienne Capers M.D.   On: 04/23/2016 03:28   Dg Abd 1 View  Result Date: 05/05/2016 CLINICAL DATA:  OG tube placement. EXAM: ABDOMEN - 1 VIEW COMPARISON:  None. FINDINGS: The side port appears to true preserved left upper quadrant, probably within the body of the stomach. A pigtail catheter overlies left hemidiaphragm. IMPRESSION: Location of the OG tube suggests a J-shaped stomach with the side port within the body and the distal tip in the distal stomach. Electronically Signed   By: Dorise Bullion III M.D   On: 05/05/2016 18:17   Ct Angio Chest Pe W Or Wo Contrast  Result Date: 04/24/2016 CLINICAL DATA:  Chest pain EXAM: CT ANGIOGRAPHY CHEST WITH CONTRAST TECHNIQUE: Multidetector CT imaging of the chest was performed using the standard protocol during bolus administration of intravenous contrast. Multiplanar CT image reconstructions and MIPs were obtained to evaluate the vascular anatomy. CONTRAST:  100 mL Isovue 370 intravenous COMPARISON:  Chest x-ray 04/24/2016 FINDINGS: Cardiovascular: No definite filling defects within the central or segmental pulmonary arteries to suggest an acute embolus. Distal right lower lobe pulmonary arteries are obscured by respiratory motion artifact. There is no evidence for aortic dissection. There is atherosclerosis of the aorta. Mild coronary artery calcification. No significant pericardial effusion.  Mediastinum/Nodes: Trachea and mainstem bronchi appear within normal limits. Small hypodense nodules within the thyroid gland. Mild air distention of the esophagus with questionable mid esophageal thickening. No significantly enlarged mediastinal lymph nodes. Lungs/Pleura: Moderate left-sided pleural effusion. Partial consolidation in the right lower lobe could reflect pneumonia.  Dense left lower lobe consolidation and partial consolidation in the lingula, may also reflect pneumonia. Mild right apical infiltrate or scar. No pneumothorax. Upper Abdomen: No acute abnormality. Musculoskeletal: Degenerative changes of the spine. Artifact creates spurious appearance of sternal fracture. Irregularity of left antero lateral ribs is suspected to be due to artifact rather than fracture. Review of the MIP images confirms the above findings. IMPRESSION: 1. No definite CT evidence for acute pulmonary embolus or aortic dissection. 2. Moderate large left pleural effusion. Dense left lower lobe consolidation with partial consolidation in the lingula and right lower lobe which may represent pneumonia. 3. Mild mid esophageal thickening. Electronically Signed   By: Donavan Foil M.D.   On: 04/24/2016 22:01   Dg Chest Port 1 View  Result Date: 05/12/2016 CLINICAL DATA:  Respiratory failure, hypertension EXAM: PORTABLE CHEST 1 VIEW COMPARISON:  05/11/2016 FINDINGS: Right Port-A-Cath and left central line remain in place, unchanged. Airspace disease in the left lower lobe again noted with small left pleural effusion. Heart is normal size. Patchy airspace disease in the right upper lobe and right lung base. IMPRESSION: Bilateral airspace opacities are again noted. Small left effusion. No real change. Electronically Signed   By: Rolm Baptise M.D.   On: 05/12/2016 07:19   Dg Chest Port 1 View  Result Date: 05/11/2016 CLINICAL DATA:  Respiratory failure. EXAM: PORTABLE CHEST 1 VIEW COMPARISON:  05/10/2016 FINDINGS: Endotracheal  and enteric tubes have been removed. Right jugular Port-A-Cath and left jugular venous catheter remain in place, terminating over the SVC. The cardiomediastinal silhouette is within normal limits. Left greater than right perihilar and basilar airspace opacities and small left pleural effusion have not significantly changed. No pneumothorax is identified. IMPRESSION: 1. Interval extubation. 2. Unchanged left pleural effusion and left greater than right airspace opacities which may reflect edema or pneumonia. Electronically Signed   By: Logan Bores M.D.   On: 05/11/2016 07:07   Dg Chest Port 1 View  Result Date: 05/10/2016 CLINICAL DATA:  Right pleural effusion. Status post right thoracentesis. EXAM: PORTABLE CHEST 1 VIEW COMPARISON:  Radiograph of same day. FINDINGS: Endotracheal and nasogastric tubes are unchanged in position. Stable position of left internal jugular catheter with tip in expected position of the SVC. Right internal jugular Port-A-Cath is unchanged in position. Atherosclerosis of thoracic aorta is noted. No pneumothorax is noted. Right pleural effusion noted on prior exam appears to be resolved status post thoracentesis. Stable left perihilar and basilar opacities are noted concerning for edema, pneumonia or atelectasis with associated pleural effusion. Bony thorax is unremarkable. IMPRESSION: Stable support apparatus. Stable left lung opacity as described above. Right pleural effusion noted on prior exam is resolved status post thoracentesis. No pneumothorax is noted. Electronically Signed   By: Marijo Conception, M.D.   On: 05/10/2016 11:27   Dg Chest Port 1 View  Result Date: 05/10/2016 CLINICAL DATA:  75 year old female with respiratory failure and hypoxia. EXAM: PORTABLE CHEST 1 VIEW COMPARISON:  Chest radiograph dated 05/09/2016 FINDINGS: Support lines and tubes in stable position. Diffuse bilateral airspace densities, right greater left as well as small bilateral pleural effusions  noted relatively similar to the prior radiograph. Stable cardiac silhouette. No acute osseous pathology. IMPRESSION: No significant interval change in bilateral airspace densities. Follow-up recommended. Stable positioning of the support devices. Electronically Signed   By: Anner Crete M.D.   On: 05/10/2016 05:56   Dg Chest Port 1 View  Result Date: 05/09/2016 CLINICAL DATA:  Acute respiratory failure. EXAM: PORTABLE  CHEST 1 VIEW COMPARISON:  Single-view of the chest 05/08/2016 and 05/07/2016. FINDINGS: Support tubes and lines are unchanged. No pneumothorax. Right greater than left pleural effusions and airspace disease persists without marked change. Heart size is normal. IMPRESSION: No change in right greater than left pleural effusions and airspace disease. Electronically Signed   By: Inge Rise M.D.   On: 05/09/2016 17:06   Dg Chest Port 1 View  Result Date: 05/08/2016 CLINICAL DATA:  Neutropenic sepsis.  Acute respiratory failure. EXAM: PORTABLE CHEST 1 VIEW COMPARISON:  Single-view of the chest 05/07/2016 and 05/06/2016. FINDINGS: Support tubes and lines including a pigtail catheter in the left chest are unchanged. Right effusion and airspace disease have worsened since the prior examinations. Airspace opacity in the left lower lung zone is unchanged. Heart size is normal. No pneumothorax. IMPRESSION: Worsened right effusion and airspace disease compatible with progressive pneumonia. No change in left basilar airspace disease. Negative for pneumothorax with a left chest tube in place. Electronically Signed   By: Inge Rise M.D.   On: 05/08/2016 07:51   Dg Chest Port 1 View  Result Date: 05/07/2016 CLINICAL DATA:  Intubation. EXAM: PORTABLE CHEST 1 VIEW COMPARISON:  05/06/2016. FINDINGS: Left chest tube in stable position. Endotracheal tube, left IJ line, NG tube, PowerPort catheter in stable position. Heart size normal. Bilateral multifocal bilateral pulmonary infiltrates again  noted IMPRESSION: 1. Lines and tubes including left chest tube in stable position. No pneumothorax. 2. Multifocal bilateral pulmonary infiltrates with small right pleural effusion. Similar findings noted on prior exam Electronically Signed   By: Marcello Moores  Register   On: 05/07/2016 07:08   Dg Chest Port 1 View  Result Date: 05/06/2016 CLINICAL DATA:  Endotracheal tube position EXAM: PORTABLE CHEST 1 VIEW COMPARISON:  05/05/2016 FINDINGS: Endotracheal tube 3 cm above the carina. Left jugular central venous catheter tip in the SVC. Port-A-Cath tip in the SVC. Gastric tube enters the stomach. Pigtail catheter overlying the left lung base is unchanged. A progression of bilateral airspace disease may most prominent in the right lung base. Progression of right pleural effusion. No pneumothorax. IMPRESSION: Endotracheal tube in satisfactory position Progression of diffuse bilateral airspace disease right greater than left. Probable pulmonary edema. Left pleural pigtail catheter in good position.  No pneumothorax. Electronically Signed   By: Franchot Gallo M.D.   On: 05/06/2016 06:41   Dg Chest Port 1 View  Result Date: 05/05/2016 CLINICAL DATA:  Follow-up examination for acute respiratory failure. EXAM: PORTABLE CHEST 1 VIEW COMPARISON:  Prior radiograph from 05/04/2016. FINDINGS: Right-sided Port-A-Cath in place, stable. Left IJ approach central venous catheter is unchanged with tip overlying the distal SVC. Enteric tube courses in the the abdomen. Side hole projects just distal to the GE junction. Pigtail drainage catheter in place over the left lung base. Cardiac and mediastinal silhouettes are stable in size and contour, and remain within normal limits. Aortic atherosclerosis noted. Lungs normally inflated. Veiling opacity overlying the right lung base favored to largely reflect effusion, similar to previous. Perihilar vascular congestion similar to previous. Patchy bibasilar opacities are slightly were the  stent, with increased right upper lobe opacity as well. No pneumothorax. Osseous structures unchanged. IMPRESSION: 1. Slight interval worsening in bilateral pulmonary infiltrates as compared to 05/04/16. 2. Veiling opacity overlying the right lung base, compatible with pleural effusion. 3. Left-sided chest tube remains in place.  No pneumothorax. Electronically Signed   By: Jeannine Boga M.D.   On: 05/05/2016 06:09   Dg Chest Port 1  View  Result Date: 05/04/2016 CLINICAL DATA:  Acute respiratory failure. EXAM: PORTABLE CHEST 1 VIEW COMPARISON:  05/02/2016.  04/30/2016. FINDINGS: Endotracheal tube noted 1.7 cm above the carina. PowerPort catheter and left IJ line in stable position. NG tube in stable position. PowerPort catheter in stable position. Left chest tube in stable position. No pneumothorax. Heart size stable. Persistent bilateral pulmonary infiltrates and/or edema. Small right pleural effusion. No pneumothorax . IMPRESSION: 1. Lines and tubes including left chest tube in stable position. No pneumothorax. 2. Distant bilateral pulmonary infiltrates and/or edema without interim change. Small right pleural effusion . Electronically Signed   By: Marcello Moores  Register   On: 05/04/2016 08:29   Dg Chest Port 1 View  Result Date: 05/02/2016 CLINICAL DATA:  Respiratory failure EXAM: PORTABLE CHEST 1 VIEW COMPARISON:  Two days ago FINDINGS: Endotracheal tube tip between the clavicular heads and carina. Bilateral central line, porta catheter on the right, with tips at the SVC level. An orogastric tube reaches the stomach. Left thoracic drain present. Patchy bilateral lung opacity, most dense in the right upper and left lower lobes. There is new increased hazy density over the bases that is not entirely explained by artifact external to the lower chest. No generalized Kerley line. No pneumothorax. Normal heart size. IMPRESSION: 1. Stable positioning of tubes and lines. 2. Bilateral pneumonia. Increased density  over the lower chest may be progression or superimposed atelectasis. Electronically Signed   By: Monte Fantasia M.D.   On: 05/02/2016 07:28   Dg Chest Port 1 View  Result Date: 04/30/2016 CLINICAL DATA:  75 year old hypertensive female with respiratory failure. Subsequent encounter. EXAM: PORTABLE CHEST 1 VIEW COMPARISON:  04/28/2016 FINDINGS: Drain left lower thoracic region projecting over the diaphragm. No pneumothorax. Endotracheal tube tip 3.5 cm above the carina. Right central line tip mid to distal superior vena cava level. Nasogastric tube courses below the diaphragm. Tip is not included on the present exam. Consolidation peripheral aspect right upper lobe and right lung apex. Left base atelectasis versus infiltrate, possibly with residual small pleural effusion. Mild pulmonary vascular congestion. Top-normal heart size. IMPRESSION: Persistent consolidation right upper lobe and left base. Drain left lower thorax. Electronically Signed   By: Genia Del M.D.   On: 04/30/2016 06:41   Dg Chest Port 1 View  Result Date: 04/28/2016 CLINICAL DATA:  Leukemia. EXAM: PORTABLE CHEST 1 VIEW COMPARISON:  April 27, 2016 FINDINGS: Stable support apparatus. No pneumothorax. Increasing infiltrate in the right upper lung and left base, worsened in the interval. No other interval changes. IMPRESSION: Worsening right upper lobe and left lower lobe infiltrates. Electronically Signed   By: Dorise Bullion III M.D   On: 04/28/2016 07:25   Dg Chest Port 1 View  Result Date: 04/27/2016 CLINICAL DATA:  75 year old female status post CT-guided left chest tube placement yesterday for pleural effusion. Initial encounter. EXAM: PORTABLE CHEST 1 VIEW COMPARISON:  04/26/2016 and earlier. FINDINGS: Portable AP semi upright view at 0904 hours. Stable endotracheal tube tip. Enteric tube side hole is at the level of the stomach. Stable left IJ central line and right chest porta cath (accessed). Pigtail left lower pleural  drain is in place with regressed veiling opacity and mildly improved ventilation at the left lung base. Patchy and confluent right upper lobe and perihilar opacity persists. No pneumothorax. No areas of worsening ventilation. IMPRESSION: 1. Left pleural drain with decreased pleural effusion and mildly improved left lung base ventilation. No pneumothorax. 2.  Otherwise stable lines and tubes. 3.  Stable right upper lobe and bilateral perihilar pneumonia. Electronically Signed   By: Genevie Korissa M.D.   On: 04/27/2016 09:35   Dg Chest Port 1 View  Result Date: 04/26/2016 CLINICAL DATA:  Sepsis, pneumonia, and acute respiratory failure EXAM: PORTABLE CHEST 1 VIEW COMPARISON:  Portable chest x-ray of April 25, 2016 FINDINGS: The lungs are reasonably well inflated. Confluent alveolar opacity is present in the right mid upper lung and appears stable. There is persistent increased density in the left mid and lower lung with obscuration of the hemidiaphragm. The retrocardiac region remains dense. The heart is not enlarged. The pulmonary vascularity is not engorged. The endotracheal tube tip lies 2.8 cm above the carina. The esophagogastric tube tip in proximal port project below the GE junction. The power port catheter tip and the left internal jugular venous catheter tip project over the midportion of the superior vena cava. IMPRESSION: Persistent bilateral pneumonia. Small left pleural effusion, stable. No late pneumothorax. The support tubes are in reasonable position. Electronically Signed   By: David  Martinique M.D.   On: 04/26/2016 07:10   Dg Chest Port 1 View  Result Date: 04/25/2016 CLINICAL DATA:  Central line placement, status post left thoracentesis EXAM: PORTABLE CHEST 1 VIEW COMPARISON:  04/25/2016 FINDINGS: Cardiac shadow is stable. An endotracheal tube, left jugular central line and right-sided chest wall port are seen in satisfactory position. No pneumothorax is noted. Decreased density is noted on the  left consistent with the recent thoracentesis. Persistent lower lobe consolidation is noted. Again no pneumothorax is seen. Patchy infiltrate is seen in the right lung stable from the prior study. Nasogastric catheter is seen within the stomach. IMPRESSION: No pneumothorax following central line placement. Reduction in left-sided pleural effusion with persistent bilateral infiltrates. Electronically Signed   By: Inez Catalina M.D.   On: 04/25/2016 18:59   Portable Chest Xray  Addendum Date: 04/25/2016   ADDENDUM REPORT: 04/25/2016 13:41 ADDENDUM: ET tube repositioning discussed by telephone with Nurse Reuben Likes on 04/25/2016 at 13:39 . Electronically Signed   By: Genevie Jennipher M.D.   On: 04/25/2016 13:41   Result Date: 04/25/2016 CLINICAL DATA:  75 year old female intubated. Enteric tube placement. Initial encounter. EXAM: PORTABLE CHEST 1 VIEW COMPARISON:  Portable abdomen from today reported separately. Chest CTA 04/24/2016. FINDINGS: Portable AP semi upright view at at 1251 hours. Endotracheal tube tip is just inside the right mainstem bronchus. Enteric tube courses to the abdomen, see comparison. Stable right chest porta cath, accessed. Widespread left mid and lower lung dense opacification seem to represent a combination of dense consolidation and pleural effusion on the CTA yesterday. Similar patchy peribronchial consolidation in the right lower lobe was present, although there is new right upper lobe peribronchial consolidation. No right pleural effusion identified. Stable cardiac size and mediastinal contours. IMPRESSION: 1. Endotracheal tube tip just inside the right mainstem bronchus. Retract 2-3 cm for more optimal placement. 2. Severe left lung pneumonia with pleural effusion as depicted by CTA yesterday. Progressive right lung involvement since that time. No right pleural effusion identified. 3. Enteric tube courses to the abdomen, see portable abdomen comparison from today reported separately.  Electronically Signed: By: Genevie Macaila M.D. On: 04/25/2016 13:33   Dg Abd Portable 1v  Result Date: 04/25/2016 CLINICAL DATA:  75 year old female OG tube placement. Initial encounter. EXAM: PORTABLE ABDOMEN - 1 VIEW COMPARISON:  Chest CTA 04/24/2016. FINDINGS: Portable AP supine view at 1306 hours. Enteric tube placed and courses into the left upper quadrant ultimately terminating  just across midline at the lower lumbar level. The side hole is in the left abdomen. This is likely within a ptotic stomach. Non obstructed bowel gas pattern. Vascular calcifications in the abdomen and pelvis. Dense retrocardiac opacity persists. No definite pneumoperitoneum on this supine view. IMPRESSION: Enteric tube in place with side hole at the level of the gastric body. Suspected gastroptosis. Electronically Signed   By: Genevie Carlena M.D.   On: 04/25/2016 13:30   Ct Image Guided Fluid Drain By Catheter  Result Date: 04/27/2016 INDICATION: Left pleural effusion EXAM: CT IMAGE GUIDED FLUID DRAIN BY CATHETER MEDICATIONS: The patient is currently admitted to the hospital and receiving intravenous antibiotics. The antibiotics were administered within an appropriate time frame prior to the initiation of the procedure. ANESTHESIA/SEDATION: None. COMPLICATIONS: None immediate. PROCEDURE: Informed written consent was obtained from the patient after a thorough discussion of the procedural risks, benefits and alternatives. All questions were addressed. Maximal Sterile Barrier Technique was utilized including caps, mask, sterile gowns, sterile gloves, sterile drape, hand hygiene and skin antiseptic. A timeout was performed prior to the initiation of the procedure. In the right decubitus position, the posterior left thorax was prepped and draped in a sterile fashion. Under CT guidance, an 18 gauge needle was advanced into the left pleural effusion and removed over an Amplatz wire. Twelve [place] dilator followed by a 12 [place] drain were  inserted. The drain was looped, string fixed, then sewn to the skin. Serosanguineous fluid was aspirated. FINDINGS: Imaging confirms placement of a left 35 French thoracostomy tube. IMPRESSION: Successful left 12 French thoracostomy. Electronically Signed   By: Marybelle Killings M.D.   On: 04/27/2016 07:42    EKG & Cardiac Imaging    EKG: 05/12/2016: Sinus tachycardia, HR 111, with LBBB.   Echocardiogram: 04/29/2016 Study Conclusions  - Left ventricle: The cavity size was normal. Wall thickness was   increased in a pattern of mild LVH. Systolic function was   moderately reduced. The estimated ejection fraction was 35%.   Features are consistent with a pseudonormal left ventricular   filling pattern, with concomitant abnormal relaxation and   increased filling pressure (grade 2 diastolic dysfunction).   Doppler parameters are consistent with high ventricular filling   pressure. - Regional wall motion abnormality: Severe hypokinesis of the   mid-apical anterior, mid anteroseptal, mid inferoseptal, apical   septal, and apical myocardium; moderate hypokinesis of the apical   inferior and apical lateral myocardium. - Mitral valve: There was mild to moderate regurgitation. - Left atrium: The atrium was moderately dilated. Volume/bsa, ES   (1-plane Simpson&'s, A4C): 36 ml/m^2.  Assessment & Plan    1. Acute on Chronic Combined Diastolic and Systolic CHF - admitted with sepsis secondary to LLL PNA which was complicated by septic shock. Was difficult to wean and was intubated from 11/30 - 05/10/2016.  - echo on 04/29/2016 and showed a reduced EF of 35% with Grade 2 DD and severe hypokinesis of the mid-apical anterior, mid anteroseptal, mid inferoseptal, apical septal, and apical myocardium were noted with moderate hypokinesis of the apical inferior and apical lateral myocardium. - started on low-dose Coreg 3.125mg  BID and Lisinopril 5mg  daily. Will add low-dose Spironolactone 12.5mg  daily.  - with  her AML and associated pancytopenia, she would not be an ideal cardiac catheterization candidate. Thankfully, she denies any recent chest discomfort or dyspnea with exertion. Would favor conservative treatment with medical management with a repeat echocardiogram in 3 months as this reduced EF could be secondary  to her recent illness.   2. Mild to Moderate MR - noted on echo this admission. - continue to follow as an outpatient.   3. Lobar PNA complicated by Septic Shock - extubated on 12/14. Afebrile in the past 24 hours.  - per admitting team.  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 Hgb trough of 6.7 on 11/27 with platelets at 15K on 11/29. Improved with current Hgb of 8.3 and platelets at 396.  - Oncology following.   5. LBBB - noted on tracings this admission. No prior images available for comparison.   Signed, Erma Heritage, PA-C 05/14/2016, 10:43 AM Pager: [phone]  Attending Note:   The patient was seen and examined.  Agree with assessment and plan as noted above.  Changes made to the above note as needed.  Patient seen and independently examined with Erma Heritage, PA-C.   We discussed all aspects of the encounter. I agree with the assessment and plan as stated above.  1. Acute systolic congestive heart failure: The patient presents with symptoms of pneumonia and sepsis. She has had a prolonged hospitalization. A recent echocardiogram reveals moderate left ventricle systolic function.  Prior to getting sick recently she's not had any episodes of shortness of breath or dyspnea with exertion. She denies any PND orthopnea. She denies any chest pain. She's never had any cardiac issues.  I suspect that her acute deterioration of her left ventricle function is due to her pneumonia/sepsis syndrome. I anticipate that with treatment of her sepsis and with initiation of low-dose beta blockers, angiotensin blockers, and Aldactone that her LV  function will improve.  I do not necessarily think that she needs a cardiac catheterization at this time. We can certainly consider heart catheter station if she improves from her AML standpoint or if the heart failure symptoms fail to improve.   I have spent a total of 40 minutes with patient reviewing hospital  notes , telemetry, EKGs, labs and examining patient as well as establishing an assessment and plan that was discussed with the patient. > 50% of time was spent in direct patient care.    Thayer Headings, Brooke Bonito., MD, [facility] 05/14/2016, 2:29 PM [address]  [place] Pager [phone]

## 2016-05-14 NOTE — Progress Notes (Signed)
Nutrition Follow-up  DOCUMENTATION CODES:   Not applicable  INTERVENTION:  - Continue to encourage PO intakes.  - RD will follow-up 12/21 and assess for needs, including oral nutrition supplements, at that time.   NUTRITION DIAGNOSIS:   Inadequate oral intake related to inability to eat as evidenced by NPO status. -diet advanced with minimal intakes d/t taste.   GOAL:   Patient will meet greater than or equal to 90% of their needs -unmet  MONITOR:   PO intake, Weight trends, Labs, I & O's  ASSESSMENT:   75 y.o. 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 The patient was in her usual state of health, got her Cytovene infusions last last Tuesday, and then over the last 3 or 4 days has developed progressive weakness and "just feeling awful". At first she thought she was just getting anemic again because of how tired she was over the weekend, but then tonight at 2AM she woke with fever to 102F, vomited, had worse malaise and chills and came to the ER.   She has had no dysuria or other urinary irritative symptoms.  She has had a new nonproductive cough this week.  No nose bleeds, bleeding from gums.  12/18 Diet advanced to Regular on 12/15 with no intakes documented since that time. Pt was moved from 2 [place] to [place] earlier this AM. She had a few bites of breakfast this AM: toast, egg, banana, and drank 100% of diet Coke. Pt reports that nothing sounds good or tastes good to her. Talked with pt about this as it relates to intubation and multiple medications and antibiotics. Pt's husband is going to bring her a steak biscuit from [employer] for lunch and she also request vanilla ice cream and diet Coke; ordered these items for pt.   Encouraged pt to continue consuming as much as she is able to meals. Reviewed items on the menu with her briefly. Weight -8.1 kg from previous assessment with diuretics.  Medications reviewed;  20 mg oral Pepcid BID, daily multivitamin with minerals, PRN IV Zofran, 40 mEq oral KCl x2 doses yesterday, 12.5 mg oral Aldactone/day.  Labs reviewed; Ca: 7.7 mg/dL.   12/15 - Pt extubated yesterday shortly after 1200 and OGT removed at that time.  - Per notes, plan for swallow evaluation today.  - Estimated nutrition needs updated based on extubation.  - RD will follow-up on Monday and monitor for nutrition-related needs based on diet advancement.  - Weight -1.2 kg from yesterday.  - Plan to change from ICU-status to SDU-status today.    12/14 - Pt with OGT and continues with Vital 1.2 @ 40 mL/hr and 30 mL Prostat BID.  - Estimated needs updated this AM based on CBW (64.6 kg).  - Current TF regimen is meeting 107% estimated kcal and 105% maximum estimated protein need.  - Per rounds, plan for one-way extubation (not terminal) this afternoon.  - Will adjust estimated needs 12/15 based on this plan.  - Per PCCM NP note this AM, biopsy did not show AML and Dr. Antonieta Pert note states pt is in remission.  - Pt denied abdominal discomfort at time of RD visit.  - RN was at bedside at that time and reported that pt did have some discomfort yesterday and that pt had 4 BMs yesterday.  - Pt had thoracentesis this AM with 1050 mL clear pleural fluid removed at that time.   Patient  is currently intubated on ventilator support MV: 6.6L/min Temp (24hrs), Avg:98.2 F (36.8 C), Min:97.7 F (36.5 C), Max:99.1 F (37.3 C) BP: 129/48 and MAP: 74. Drips: Fentanyl @ 50 mcg/hr and Precedex @ 0.5 mcg/kg/hr.    Diet Order:  Diet regular Room service appropriate? Yes; Fluid consistency: Thin  Skin:  Reviewed, no issues  Last BM:  12/17  Height:   Ht Readings from Last 1 Encounters:  05/14/16 5\' 2"  (1.575 m)    Weight:   Wt Readings from Last 1 Encounters:  05/14/16 122 lb (55.3 kg)    Ideal Body Weight:  50 kg  BMI:  Body mass index is 22.31 kg/m.  Estimated Nutritional Needs:    Kcal:  1585-1775 (25-28 kcal/kg)  Protein:  63-76 grams (1-1.2 grams/kg)  Fluid:  1.5L/day  EDUCATION NEEDS:   No education needs identified at this time    Jarome Matin, MS, RD, LDN, CNSC Inpatient Clinical Dietitian Pager # [phone] After hours/weekend pager # [phone]

## 2016-05-14 NOTE — Progress Notes (Signed)
[name] looks better. She is still extubated. She had a relatively uneventful weekend.  Her main problem now is just profound weakness. It sounds like she is able to eat regular food. It does not sound like she has any swallowing problems.  She will be moved out of the ICU today.  She's had no fever. She is off antibiotics. Hopefully, some physical therapy can start to help her. It sounds like they would like to move over to skilled nursing. I suppose this would be reasonable. I know she would really want to be home for Christmas.  Her labs today show her white cell count to be 8.3. Hemoglobin 10.7 and platelet count 396,000. Her LDH is 262. Her potassium is 4.3.  She has had no rashes. The cellulitis in the right forearm is continuing to improve.  I do not think she's had any diarrhea.  On her physical exam, the temperature is 98. Pulse is 79. Blood pressure 133/76. Her oxygen saturation is 100%. Her oral exam shows no because situs. Her lungs show some decreased at the bases. Cardiac exam regular rate and rhythm with no murmurs, rubs or bruits. Abdomen is soft. She has decent bowel sounds. There is no fluid wave. There is no palpable liver or spleen tip. Extremities shows some 1+ edema in her legs. Skin exam shows the cellulitis in the right forearm continues to improve.  [name] has AML. This appears to be in remission right now. Her bone marrow that we did last week showed this to be in remission.  For right now, I think our goal is to continue to improve her quality of life. I do not see that we have to treat her. Often times, even normal remission, we give maintenance therapy. However, I just don't see that we really have to pursue that with her and we have to continue with getting her quality of life better so she can walk and be independent.  I'm so happy for her. It is so obvious that the staff in the ICU has done a fantastic job.  I know that her husband is incredibly pleased  with the wonderful care that she has received so far.  Kristina Haw, MD  Kristina Meyer 1:46-50

## 2016-05-14 NOTE — Clinical Social Work Placement (Signed)
   CLINICAL SOCIAL WORK PLACEMENT  NOTE  Date:  05/14/2016  Patient Details  Name: Kristina Meyer MRN: [id number] Date of Birth: 1940-10-07  Clinical Social Work is seeking post-discharge placement for this patient at the [place] level of care (*CSW will initial, date and re-position this form in  chart as items are completed):  Yes   Patient/family provided with [facility] Work Department's list of facilities offering this level of care within the geographic area requested by the patient (or if unable, by the patient's family).  Yes   Patient/family informed of their freedom to choose among providers that offer the needed level of care, that participate in Medicare, Medicaid or managed care program needed by the patient, have an available bed and are willing to accept the patient.  Yes   Patient/family informed of [facility]'s ownership interest in [facility] and [facility], as well as of the fact that they are under no obligation to receive care at these facilities.  PASRR submitted to EDS on       PASRR number received on       Existing PASRR number confirmed on 05/14/16     FL2 transmitted to all facilities in geographic area requested by pt/family on 05/14/16     FL2 transmitted to all facilities within larger geographic area on       Patient informed that his/her managed care company has contracts with or will negotiate with certain facilities, including the following:            Patient/family informed of bed offers received.  Patient chooses bed at       Physician recommends and patient chooses bed at      Patient to be transferred to   on  .  Patient to be transferred to facility by       Patient family notified on   of transfer.  Name of family member notified:        PHYSICIAN       Additional Comment:    _______________________________________________ Standley Brooking, LCSW 05/14/2016, 1:51 PM

## 2016-05-14 NOTE — Progress Notes (Signed)
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 Reported progressive weakness, lethargy, fever 103, and malaise. Also developed a non-productive cough. Presented for eval that revealed LLL infiltrate.  She is admitted to hospitalist service on 11/27,.  Flu screen, pneumococcal PNA, legionella PNA screens negative.  Hgb was 6.7, received PRBC. She started cefepime and azithro. All cx data so far negative.   On 11/28 has continued to have fever, WBC dropping,she became neutropenia,  has had some intermittent episodes of hypotension. She received IVF and abx were changed to vanco + imipenem. She has been progressively weak and now moves to SDU. PCCM consulted to eval , patient has significant pleural effusion, developed worsening respiratory status, she had bedside thoracentesis on 11/29,   she continue deteriorate and was then intubated on 11/30, she was put on pressors due to hypotension. She was admitted to ICU. She underwent bronchoscopy on 12/4, she has chest tube placed, she eventually showed improvement and was extubated on 12/14, patient code status changed to DNR.  Patient is transferred back to hospitalist service on 12/16.   LINES/TUBES: Port-s-cath >>  OETT 11/29 >>12/14 Left IJ CVL 11/29 >>12/18 Chest tube (L) 11/30 >> 12/13  SIGNIFICANT EVENTS: 11/29 - thora. Incomplete drainage of left pleural space 11/30 - Left Chest tube placed.  12/04 - Bronch 12/08 - Required IVF for low bp. Lasix held 12/09 - new Rt distal forearm redness x few days. Not on pressors. Failed SBT yesterday after 3h with agitation/desaturation /tacycardia. Currently on fent 50 and doing sbt and followng commands with calm. Fever coming down, abx d/c'd  12/10 - failed SBT, WBC up. Got low bp with lasix and given albumin. 1 unit prbc  this AM for hgb 6.9.  12/12 - weaned on 12/5 12/13 - chest tube fell out 12/14 extubated 12/14 rt thora 1050 cc clear   CULTURES: Blood 11/27 >>  (-) Urine 11/27 >> insignificant growth Blood 11/28 >> (-) Urine 11/28 >> (-) Viral resp panel 11/28 >> neg Pleural fluid 11/29>>>no orgs but abundant wbc BAL bacterial 12/4 >> negative BAL fungal 12/4 >> scant candida guilliermondii BAL afb12/4 >> BAL respiratory panel 12/4 > negative Pleural fluid 12/14>>  ANTIBIOTICS: Cefepime 11/27 >> 11/28 azithro 11/27 >> 12/2 vanco 11/28 >> 12/9 meropenem 11/28 >> 12/9  anidulafungin 11/30 >> 12/11 Acyclovir 12/1 >> 12/11     HPI/Recap of past 24 hours:  Report feeling better, she denies chest pain, no sob, on room air.no edema Report the cream helped hemorroids,  Assessment/Plan: Principal Problem:   HCAP (healthcare-associated pneumonia) Active Problems:   Pancytopenia ([place])   Hypokalemia   Dehydration   Pneumonia involving left lung   Severe sepsis (HCC)   Acute respiratory failure with hypoxemia (HCC)   Encephalopathy acute   S/P thoracentesis   Septic shock (HCC)   Pleural effusion, left  Lobar pneumonia, bilateral pleural effusion, respiratory failure required intubation, bronchoscopy, bilateral thoracentesis and chest tube Patient presented with fever of 103, sinus tachycardia, cough, lethargy, she initially admitted to hospitalist service , she decompensated the next day and transferred to icu intubated. All culture including blood, sputum, urine, pleural fluids, bronch specimen unremarkable, except scant candida guilliermondii She received vanc/cefepime/meropenem/zithromax/anidulafungin/acyclovir initially due to immunosuppressed state with recent chemotherapy for AML. And she did have neurtropenia from 11/29 to 12/9 She has improved,  now off all abx, extubated, on room air, repeat cxr on 12/16 with bilateral airspace opacities, small effusions ( last thoracentesis  was on [id number])  Acute systolic CHF, EKG with lBBB Patient denies prior cardiac history, no prior ekg or echo before this hospitalization. Echo this hospitalization LVEF35% Acute chf from infection? From chemo? She denies chest pain, no edema,  Start coreg on 12/16, consider lisinopril if bp able to tolerate.  Consider prn lasix Likely not a candidate for invasive testing or intervention due to AML, Consider cardiology consult.  Hypokalemia: replace k daily  Pancytopenia:  She presented with pancytopenia wbc as low as 0.4, hgb aas low as  6.7, plt as low as 15,  Initial pancytopenia likely combination from chemo and infection She received prbc transfusion on 11/27, 11/30, 12/5, 12/10, 12/14 total of 6 units so far She received plt transfusionx3  on 11/29 Counts have now improved.   AML:  last chemo with decitabine on 11/21,  bone marrow on 12/12 no significant blast cells. Pleural fluids cytology negative for blast cells  bronch cytology negative for malignancy Wbc started to increase from 12/15, ldh seems slightly trending up as well  oncology following  Deconditioning: PT eval, may need snf  Code Status: DNR  Family Communication: patient and husband  Disposition Plan: likely SNF early next week   Consultants:  Critical care  oncology  Procedures:  See above  Antibiotics:  See above   Objective: BP (!) 164/42 (BP Location: Left Arm)   Pulse 86   Temp 98.5 F (36.9 C) (Oral)   Resp 17   Ht 5\' 2"  (1.575 m)   Wt 55.6 kg (122 lb 9.2 oz)   SpO2 100%   BMI 22.42 kg/m   Intake/Output Summary (Last 24 hours) at 05/14/16 0742 Last data filed at 05/13/16 1100  Gross per 24 hour  Intake               40 ml  Output                0 ml  Net               40 ml   Filed Weights   05/13/16 0038 05/13/16 0500 05/14/16 0340  Weight: 58.4 kg (128 lb 12 oz) 60.5 kg (133 lb 6.1 oz) 55.6 kg (122 lb 9.2 oz)    Exam:   General:  Frail but NAD, on room  air  Cardiovascular: RRR  Respiratory: diminished at basis, no wheezing, no rales, no rhonchi  Abdomen: Soft/ND/NT, positive BS  Musculoskeletal: No Edema  Neuro: aaox3  Data Reviewed: Basic Metabolic Panel:  Recent Labs Lab 05/08/16 0345 05/09/16 0337 05/10/16 0451 05/11/16 0343 05/12/16 0546 05/13/16 0514 05/14/16 0316  NA 140 139 140 138 140 137 139  K 3.1* 3.2* 3.2* 3.3* 2.8* 3.2* 4.3  CL 100* 101 101 101 101 105 106  CO2 35* 33* 35* 30 27 26 24   GLUCOSE 150* 147* 126* 116* 102* 101* 109*  BUN 19 22* 21* 15 9 8 9   CREATININE 0.36* 0.37* 0.35* 0.39* 0.48 0.43* 0.53  CALCIUM 7.8* 7.9* 8.2* 7.8* 7.9* 7.7* 7.7*  MG 1.9 1.8 1.8  --  2.5*  --   --   PHOS 3.6 3.7  --   --  3.3  --   --    Liver Function Tests:  Recent Labs Lab 05/10/16 1147 05/11/16 0343  AST  --  26  ALT  --  20  ALKPHOS  --  116  BILITOT  --  1.2  PROT 5.6* 5.3*  ALBUMIN  --  2.3*   No results for input(s): LIPASE, AMYLASE in the last 168 hours. No results for input(s): AMMONIA in the last 168 hours. CBC:  Recent Labs Lab 05/08/16 0345 05/09/16 0337 05/10/16 0451 05/10/16 1756 05/11/16 0343 05/12/16 0453 05/13/16 0514 05/14/16 0316  WBC 8.1 7.1 9.9  --  16.7* 14.9* 10.7* 8.3  NEUTROABS 6.0 4.9  --   --   --   --  7.7 5.0  HGB 8.1* 7.6* 7.7* 10.4* 9.6* 9.9* 10.2* 10.7*  HCT 25.0* 23.2* 23.3* 32.0* 29.3* 29.3* 30.6* 32.5*  MCV 95.1 92.4 94.0  --  90.2 89.6 89.7 90.0  PLT 184 174 197  --  286 314 357 396   Cardiac Enzymes:   No results for input(s): CKTOTAL, CKMB, CKMBINDEX, TROPONINI in the last 168 hours. BNP (last 3 results) No results for input(s): BNP in the last 8760 hours.  ProBNP (last 3 results) No results for input(s): PROBNP in the last 8760 hours.  CBG:  Recent Labs Lab 05/13/16 0416 05/13/16 0739 05/13/16 1130 05/13/16 1558 05/13/16 1944  GLUCAP 105* 109* 108* 104* 112*    Recent Results (from the past 240 hour(s))  Body fluid culture     Status: None  (Preliminary result)   Collection Time: 05/10/16 10:30 AM  Result Value Ref Range Status   Specimen Description PLEURAL  Final   Special Requests NONE  Final   Gram Stain   Final    FEW WBC PRESENT, PREDOMINANTLY PMN NO ORGANISMS SEEN    Culture   Final    NO GROWTH 3 DAYS Performed at [facility]    Report Status PENDING  Incomplete     Studies: No results found.  Scheduled Meds: . alteplase  4 mg Intracatheter Once  . atorvastatin  10 mg Per Tube QHS  . carvedilol  3.125 mg Oral BID WC  . famotidine  20 mg Oral BID  . hydrocortisone   Rectal BID  . lisinopril  5 mg Oral Daily  . mouth rinse  15 mL Mouth Rinse BID  . multivitamin with minerals  1 tablet Oral Daily  . sodium chloride  500 mL Intravenous Once  . sodium chloride flush  10-40 mL Intracatheter Q12H  . temazepam  7.5 mg Oral Once    Continuous Infusions:   Time spent: 64mins  Nura Cahoon MD, PhD  [facility] Pager [phone]. If 7PM-7AM, please contact night-coverage at [email or web address], password [facility] 05/14/2016, 7:42 AM  LOS: 21 days

## 2016-05-14 NOTE — NC FL2 (Signed)
[place] LEVEL OF CARE SCREENING TOOL     IDENTIFICATION  Patient Name: Kristina Meyer Birthdate: 09/25/1940 Sex: female Admission Date (Current Location): 04/23/2016  [facility] and [place] Number:  Herbalist and Address:  [facility],  [place] [address] [place]      Provider Number: [id number]  Attending Physician Name and Address:  Florencia Reasons, MD  Relative Name and Phone Number:       Current Level of Care: Hospital Recommended Level of Care: [place] Prior Approval Number:    Date Approved/Denied:   PASRR Number: [id number]  Discharge Plan: SNF    Current Diagnoses: Patient Active Problem List   Diagnosis Date Noted  . Pleural effusion, left   . Acute respiratory failure with hypoxemia ([place]) 04/25/2016  . Encephalopathy acute 04/25/2016  . S/P thoracentesis   . Septic shock ([place])   . Severe sepsis ([place])   . HCAP (healthcare-associated pneumonia) 04/23/2016  . Pancytopenia ([place]) 04/23/2016  . Hypokalemia 04/23/2016  . Dehydration 04/23/2016  . Pneumonia involving left lung 04/23/2016  . Anxiety disorder 04/04/2016  . Glucose intolerance (impaired glucose tolerance) 04/04/2016  . History of mitral valve prolapse 04/04/2016  . History of osteopenia 04/04/2016  . Hyperlipidemia 04/04/2016  . 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 Leukocytosis 12/21/2015  . Back pain 12/02/2013  . 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 Pseudophakia of both eyes 04/09/2011    Orientation RESPIRATION BLADDER Height & Weight     Self, Time, Situation, Place  Normal Incontinent Weight: 122 lb (55.3 kg) Height:  5\' 2"  (157.5 cm)  BEHAVIORAL SYMPTOMS/MOOD NEUROLOGICAL BOWEL NUTRITION STATUS      Continent Diet (Regular)  AMBULATORY STATUS COMMUNICATION OF NEEDS Skin   Extensive Assist Verbally Normal                       Personal Care  Assistance Level of Assistance  Bathing, Dressing Bathing Assistance: Limited assistance   Dressing Assistance: Limited assistance     Functional Limitations Info             SPECIAL CARE FACTORS FREQUENCY  PT (By licensed PT), OT (By licensed OT)     PT Frequency: 5 OT Frequency: 5            Contractures      Additional Factors Info  Code Status, Allergies Code Status Info: DNR             Current Medications (05/14/2016):  This is the current hospital active medication list Current Facility-Administered Medications  Medication Dose Route Frequency Provider Last Rate Last Dose  . alteplase (CATHFLO ACTIVASE) injection 4 mg  4 mg Intracatheter Once Juanito Doom, MD      . atorvastatin (LIPITOR) tablet 10 mg  10 mg Per Tube QHS [name], NP   10 mg at 05/13/16 2146  . carvedilol (COREG) tablet 3.125 mg  3.125 mg Oral BID WC Florencia Reasons, MD   3.125 mg at 05/14/16 0900  . famotidine (PEPCID) tablet 20 mg  20 mg Oral BID Berton Mount, RPH   20 mg at 05/14/16 [id number]  . fentaNYL (SUBLIMAZE) injection 25-100 mcg  25-100 mcg Intravenous Q2H PRN Donita Brooks, NP   25 mcg at 05/10/16 2025  . hydrocortisone (ANUSOL-HC) 2.5 % rectal cream   Rectal BID Florencia Reasons, MD      .  lisinopril (PRINIVIL,ZESTRIL) tablet 5 mg  5 mg Oral Daily Florencia Reasons, MD   5 mg at 05/14/16 0904  . multivitamin with minerals tablet 1 tablet  1 tablet Oral Daily Berton Mount, RPH   1 tablet at 05/14/16 [id number]  . ondansetron (ZOFRAN) injection 4 mg  4 mg Intravenous Q6H PRN Donita Brooks, NP   4 mg at 05/10/16 2025  . phenol (CHLORASEPTIC) mouth spray 1 spray  1 spray Mouth/Throat PRN Donita Brooks, NP   1 spray at 05/10/16 1508  . sodium chloride 0.9 % bolus 500 mL  500 mL Intravenous Once Anders Simmonds, MD      . sodium chloride flush (NS) 0.9 % injection 10-40 mL  10-40 mL Intracatheter Q12H Dron Tanna Furry, MD   10 mL at 05/14/16 0905  . sodium chloride flush (NS) 0.9 % injection 10-40  mL  10-40 mL Intracatheter PRN Dron Tanna Furry, MD      . spironolactone (ALDACTONE) tablet 12.5 mg  12.5 mg Oral Daily Erma Heritage, PA   12.5 mg at 05/14/16 1256  . temazepam (RESTORIL) capsule 7.5 mg  7.5 mg Oral Once Brand Males, MD         Discharge Medications: Please see discharge summary for a list of discharge medications.  Relevant Imaging Results:  Relevant Lab Results:   Additional Information SSN: [id number]  Standley Brooking, LCSW

## 2016-05-14 NOTE — Clinical Social Work Note (Signed)
Clinical Social Work Assessment  Patient Details  Name: Kristina Meyer MRN: [id number] Date of Birth: 06/30/1940  Date of referral:  05/14/16               Reason for consult:  Facility Placement                Permission sought to share information with:  Chartered certified accountant granted to share information::  Yes, Verbal Permission Granted  Name::        Agency::     Relationship::     Contact Information:     Housing/Transportation Living arrangements for the past 2 months:  Single Family Home Source of Information:  Patient, Adult Children, Spouse Patient Interpreter Needed:  None Criminal Activity/Legal Involvement Pertinent to Current Situation/Hospitalization:  No - Comment as needed Significant Relationships:  Adult Children, Spouse Lives with:  Spouse Do you feel safe going back to the place where you live?  No Need for family participation in patient care:  Yes (Comment)  Care giving concerns:  CSW reviewed PT evaluation recommending SNF at discharge.    Social Worker assessment / plan:  CSW spoke with patient, husband & son Gerald Stabs at bedside re: discharge planning. Patient is agreeable with plan for SNF, requesting [facility]. CSW awaiting decision back from [facility] re: bed offer.   Employment status:  Retired Nurse, adult PT Recommendations:  [place] / Referral to community resources:  [place]  Patient/Family's Response to care:  Patient states that she's not been to SNF in the past, but understands the need to go.   Patient/Family's Understanding of and Emotional Response to Diagnosis, Current Treatment, and Prognosis:  Patient hopes to be discharged before Christmas.   Emotional Assessment Appearance:  Appears stated age Attitude/Demeanor/Rapport:    Affect (typically observed):    Orientation:  Oriented to Self, Oriented to Place, Oriented to  Time, Oriented to  Situation Alcohol / Substance use:    Psych involvement (Current and /or in the community):     Discharge Needs  Concerns to be addressed:    Readmission within the last 30 days:    Current discharge risk:    Barriers to Discharge:      Standley Brooking, LCSW 05/14/2016, 1:50 PM

## 2016-05-14 NOTE — Progress Notes (Addendum)
Speech Language Pathology Treatment: Dysphagia  Patient Details Name: Lynell Greenhouse MRN: [id number] DOB: 20-Jun-1940 Today's Date: 05/14/2016 Time: 7124-5809 SLP Time Calculation (min) (ACUTE ONLY): 31 min  Assessment / Plan / Recommendation Clinical Impression  Pt sitting fully upright and willing to consume chocolate Ensure.  No indication of airway compromise observed and swallow was timely/appeared strong.  Pt admits that her voice is not normal but she gives it a 6/10.  She does admit her voice is consistent with a few weeks prior to hospitalization when she was starting to become sick.  Pt with intermittent throat clearing but also nasal drainage.  SlP set up oral suction for pt to use. Intake poor as pt reports the food does not taste good. Note appearance of abrasion on left tongue= pt able to view in mirror. SLP ?s if this is due to agitation during intubation.  Advised pt to masticate on right side pending lingual healing.    Since she is on a regular diet, advised that she can have food from home.  Provided heimlich maneuver in writing and verbally.  As pt with good swallow function and near resolution of acute voice changes and all education completed, SLP to sign off. Thanks.   HPI HPI: 75 y/o F with AML admitted with neutropenic fever, infiltrates.  Pt being treated for acute Myeloid leukemia, admitted to ICU with sepsis, on ventilator from 11/29 to 12/14. Dr. Antonieta Pert note reports she is in remission.       SLP Plan  All goals met     Recommendations  Diet recommendations: Regular;Thin liquid Liquids provided via: Straw Medication Administration: Whole meds with liquid Supervision: Patient able to self feed Compensations: Slow rate;Small sips/bites Postural Changes and/or Swallow Maneuvers: Seated upright 90 degrees;Upright 30-60 min after meal                Oral Care Recommendations: Oral care BID Plan: All goals met       GO                Dariann, [place], [place] [facility] [place] [phone]

## 2016-05-14 NOTE — Progress Notes (Signed)
PT Cancellation Note  Patient Details Name: Kristina Meyer MRN: [id number] DOB: 06-Nov-1940   Cancelled Treatment:     working with speech therapist.  Will check back another time   Rica Koyanagi  PTA [facility]  Acute  Rehab Pager      [phone]

## 2016-05-15 ENCOUNTER — Inpatient Hospital Stay (HOSPITAL_COMMUNITY): Payer: Medicare Other

## 2016-05-15 LAB — CBC WITH DIFFERENTIAL/PLATELET
BASOS PCT: 1 %
Basophils Absolute: 0.1 10*3/uL (ref 0.0–0.1)
EOS PCT: 0 %
Eosinophils Absolute: 0 10*3/uL (ref 0.0–0.7)
HEMATOCRIT: 32.9 % — AB (ref 36.0–46.0)
HEMOGLOBIN: 10.8 g/dL — AB (ref 12.0–15.0)
LYMPHS ABS: 1.9 10*3/uL (ref 0.7–4.0)
Lymphocytes Relative: 26 %
MCH: 29.7 pg (ref 26.0–34.0)
MCHC: 32.8 g/dL (ref 30.0–36.0)
MCV: 90.4 fL (ref 78.0–100.0)
Monocytes Absolute: 1.1 10*3/uL — ABNORMAL HIGH (ref 0.1–1.0)
Monocytes Relative: 15 %
NEUTROS ABS: 4.1 10*3/uL (ref 1.7–7.7)
Neutrophils Relative %: 58 %
Platelets: 422 10*3/uL — ABNORMAL HIGH (ref 150–400)
RBC: 3.64 MIL/uL — ABNORMAL LOW (ref 3.87–5.11)
RDW: 22 % — ABNORMAL HIGH (ref 11.5–15.5)
WBC: 7.2 10*3/uL (ref 4.0–10.5)
nRBC: 1 /100 WBC — ABNORMAL HIGH

## 2016-05-15 LAB — HEMOGLOBIN A1C
HEMOGLOBIN A1C: 5.9 % — AB (ref 4.8–5.6)
Mean Plasma Glucose: 123 mg/dL

## 2016-05-15 LAB — BASIC METABOLIC PANEL
Anion gap: 8 (ref 5–15)
BUN: 10 mg/dL (ref 6–20)
CHLORIDE: 107 mmol/L (ref 101–111)
CO2: 27 mmol/L (ref 22–32)
CREATININE: 0.53 mg/dL (ref 0.44–1.00)
Calcium: 8.3 mg/dL — ABNORMAL LOW (ref 8.9–10.3)
GFR calc non Af Amer: >60 mL/min (ref >60)
Glucose, Bld: 120 mg/dL — ABNORMAL HIGH (ref 65–99)
Potassium: 3.3 mmol/L — ABNORMAL LOW (ref 3.5–5.1)
Sodium: 142 mmol/L (ref 135–145)

## 2016-05-15 MED ORDER — POTASSIUM CHLORIDE CRYS ER 20 MEQ PO TBCR
40.0000 meq | EXTENDED_RELEASE_TABLET | Freq: Once | ORAL | Status: DC
  Filled 2016-05-15: qty 2

## 2016-05-15 MED ORDER — AMOXICILLIN-POT CLAVULANATE 875-125 MG PO TABS
1.0000 | ORAL_TABLET | Freq: Two times a day (BID) | ORAL | Status: DC
  Administered 2016-05-15 – 2016-05-16 (×3): 1 via ORAL
  Filled 2016-05-15 (×3): qty 1

## 2016-05-15 MED ORDER — POTASSIUM CHLORIDE 20 MEQ/15ML (10%) PO SOLN
40.0000 meq | Freq: Once | ORAL | Status: AC
  Administered 2016-05-15: 40 meq via ORAL
  Filled 2016-05-15: qty 30

## 2016-05-15 NOTE — Progress Notes (Signed)
Patient Name: Kristina Meyer Date of Encounter: 05/15/2016  Primary Cardiologist: Dr. Antonieta Loveless Problem List     Principal Problem:   HCAP (healthcare-associated pneumonia) Active Problems:   Pancytopenia ([place])   Hypokalemia   Dehydration   Pneumonia involving left lung   Severe sepsis (HCC)   Acute respiratory failure with hypoxemia (HCC)   Encephalopathy acute   S/P thoracentesis   Septic shock (HCC)   Pleural effusion, left     Subjective   Pt feeling well without shortness of breath, chest pain, palpitations or dizziness. She reports that she walked down the hall for the first time and tolerated it well.   Inpatient Medications    Scheduled Meds: . alteplase  4 mg Intracatheter Once  . amoxicillin-clavulanate  1 tablet Oral Q12H  . atorvastatin  10 mg Per Tube QHS  . carvedilol  3.125 mg Oral BID WC  . famotidine  20 mg Oral BID  . hydrocortisone   Rectal BID  . lisinopril  5 mg Oral Daily  . multivitamin with minerals  1 tablet Oral Daily  . potassium chloride  40 mEq Oral Once  . sodium chloride  500 mL Intravenous Once  . sodium chloride flush  10-40 mL Intracatheter Q12H  . spironolactone  12.5 mg Oral Daily  . temazepam  7.5 mg Oral Once   Continuous Infusions:  PRN Meds: fentaNYL (SUBLIMAZE) injection, ondansetron (ZOFRAN) IV, phenol, sodium chloride flush   Vital Signs    Vitals:   05/14/16 1033 05/14/16 1746 05/14/16 2105 05/15/16 0531  BP: 133/76 (!) 158/73 (!) 132/55 109/84  Pulse: 79 84 82 78  Resp: 18  18 17   Temp: 98 F (36.7 C)  99.2 F (37.3 C) 98.3 F (36.8 C)  TempSrc: Oral  Oral Oral  SpO2: 100%  97% 100%  Weight: 122 lb (55.3 kg)   122 lb 2.2 oz (55.4 kg)  Height: 5\' 2"  (1.575 m)       Intake/Output Summary (Last 24 hours) at 05/15/16 1021 Last data filed at 05/15/16 0600  Gross per 24 hour  Intake              340 ml  Output                0 ml  Net              340 ml   Filed Weights   05/14/16 0340 05/14/16 1033  05/15/16 0531  Weight: 122 lb 9.2 oz (55.6 kg) 122 lb (55.3 kg) 122 lb 2.2 oz (55.4 kg)    Physical Exam    GEN: Pleasant caucasian female, in no acute distress.  HEENT: Grossly normal.  Neck: Supple, no JVD, carotid bruits, or masses. Cardiac: RRR, 2/6 murmur, no rubs, or gallops. No clubbing, cyanosis, edema.  Radials/DP/PT 2+ and equal bilaterally.  Respiratory:  Respirations regular and unlabored, clear to auscultation bilaterally. GI: Soft, nontender, nondistended, BS + x 4. MS: no deformity or atrophy. Skin: warm and dry, no rash. Neuro:  Strength and sensation are intact. Psych: AAOx3.  Normal affect.  Labs    CBC  Recent Labs  05/14/16 0316 05/15/16 0342  WBC 8.3 7.2  NEUTROABS 5.0 4.1  HGB 10.7* 10.8*  HCT 32.5* 32.9*  MCV 90.0 90.4  PLT 396 [id number]*   Basic Metabolic Panel  Recent Labs  05/14/16 0316 05/15/16 0342  NA 139 142  K 4.3 3.3*  CL 106 107  CO2 24 27  GLUCOSE 109* 120*  BUN 9 10  CREATININE 0.53 0.53  CALCIUM 7.7* 8.3*   Liver Function Tests No results for input(s): AST, ALT, ALKPHOS, BILITOT, PROT, ALBUMIN in the last 72 hours. No results for input(s): LIPASE, AMYLASE in the last 72 hours. Cardiac Enzymes No results for input(s): CKTOTAL, CKMB, CKMBINDEX, TROPONINI in the last 72 hours. BNP Invalid input(s): POCBNP D-Dimer No results for input(s): DDIMER in the last 72 hours. Hemoglobin A1C  Recent Labs  05/14/16 0316  HGBA1C 5.9*   Fasting Lipid Panel  Recent Labs  05/13/16 0514  CHOL 126  HDL 38*  LDLCALC 61  TRIG 134  CHOLHDL 3.3   Thyroid Function Tests No results for input(s): TSH, T4TOTAL, T3FREE, THYROIDAB in the last 72 hours.  Invalid input(s): FREET3  Telemetry    Sinus rhythm with rare PAC's, rates in 70's-90's - Personally Reviewed  ECG    No new tracing - Personally Reviewed  Radiology    Dg Chest 2 View  Result Date: 05/14/2016 CLINICAL DATA:  Weakness. EXAM: CHEST  2 VIEW COMPARISON:   05/12/2016. FINDINGS: Interval removal of left IJ line. PowerPort catheter noted with lead tip projected right ventricle. Heart size normal. Prominent left base new infiltrate consistent with pneumonia. Heart size normal. Previously identified bilateral interstitial prominence has cleared. Small left pleural effusion cannot be excluded. No pneumothorax IMPRESSION: 1. Interim removal of previously identified left IJ line . PowerPort catheter and stable position. 2. Prominent new infiltrate in the left lung base consistent with pneumonia no on today's exam. Prior bilateral pulmonary interstitial prominence suggesting CHF has cleared. Small residual left pleural effusion. Electronically Signed   By: Marcello Moores  Register   On: 05/14/2016 17:15    Cardiac Studies   Echocardiogram: 04/29/2016 Study Conclusions  - Left ventricle: The cavity size was normal. Wall thickness was increased in a pattern of mild LVH. Systolic function was moderately reduced. The estimated ejection fraction was 35%. Features are consistent with a pseudonormal left ventricular filling pattern, with concomitant abnormal relaxation and increased filling pressure (grade 2 diastolic dysfunction). Doppler parameters are consistent with high ventricular filling pressure. - Regional wall motion abnormality: Severe hypokinesis of the mid-apical anterior, mid anteroseptal, mid inferoseptal, apical septal, and apical myocardium; moderate hypokinesis of the apical inferior and apical lateral myocardium. - Mitral valve: There was mild to moderate regurgitation. - Left atrium: The atrium was moderately dilated. Volume/bsa, ES (1-plane Simpson&'s, A4C): 36 ml/m^2.  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 She was started on broad spectrum  antibiotics, developed septic shock and was intubated from 11/30-12/14. Cards consulted for reduced EF. Assessment & Plan    1. Acute on chronic combined diastolic and systolic CHF -admitted with sepsis secondary to LLL pneumonia complicated by septic shock. Intubated from 11/30-12/14/17 -Echo on 04/29/16 showed a reduced EF of 35% with Grade 2 DD and severe hypokineses of the mid-apical anterior, mid anteroseptal, mid inferoseptal, apical septal and apical myocardium as well as moderate hypokinesis of the apical inferior and apical lateral myocardium -Started on low-dose Coreg 3.125 mg BID and lisinopril 5 mg daily. Cardiac rhythm is stable without significant ectopy rates in 70's-90's. BP stable. Cr 0.53 today. -Acute deterioration of left ventricular function likely due to her pneumonia/sepsis syndrome. With her recent septic shock and her  AML with pancytopneia, will defer cardiac catheterization at this time. Treating medically with beta-blocker, ACE-I, spironolactone and will assess response. May consider cardiac cath in the future if AML improves and heart filure symptoms fail to improve with current therapy. -No current clinical signs of fluid overload.  2. Mild to Moderate MR - noted on echo this admission. - continue to follow as an outpatient.   3. Lobar PNA complicated by Septic Shock -s/p drainage of loculated left pleural effusion on 12/1 -s/p thoracentesis on 12/14  - extubated on 12/14. Afebrile in the past 24 hours.  - per admitting team.  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 Hgb trough of 6.7 on 11/27 with platelets at 15K on 11/29. Improved with current Hgb of 10.8 and platelets at 422.  - Oncology following.   5. LBBB - noted on tracings this admission. No prior images available for comparison.   6. Hypokalemia -K+ 3.3 today. Order for replacement entered by Dr Erlinda Hong  Signed, Daune Perch, NP  05/15/2016, 10:21 AM   Attending Note:     The patient was seen and examined.  Agree with assessment and plan as noted above.  Changes made to the above note as needed.  Patient seen and independently examined with Pecolia Ades, NP .   We discussed all aspects of the encounter. I agree with the assessment and plan as stated above.  Continue with coreg, Lisinopril , and Aldactone  She seems to be doing very well I suspect that she could go home in the next several days from my standpoint    I have spent a total of 30 minutes with patient reviewing hospital  notes , telemetry, EKGs, labs and examining patient as well as establishing an assessment and plan that was discussed with the patient. > 50% of time was spent in direct patient care.    Thayer Headings, Brooke Bonito., MD, [facility] 05/15/2016, 12:24 PM [address]  [place] Pager [phone]

## 2016-05-15 NOTE — Clinical Social Work Placement (Signed)
CSW informed patient's husband, Thayer Jew that the facility he had asked about - [facility] - had offered a bed. Patient's husband states that he has now decided that he would prefer to take her home for the holidays & see how she does, if she needs to go to SNF - he states that he would do that from home. RNCM, Cookie made aware.   No further CSW needs identified - CSW signing off.   Raynaldo Opitz, [facility] Clinical Social Worker cell #: [phone]    CLINICAL SOCIAL WORK PLACEMENT  NOTE  Date:  05/15/2016  Patient Details  Name: Kristina Meyer MRN: [id number] Date of Birth: 11-05-1940  Clinical Social Work is seeking post-discharge placement for this patient at the [address] level of care (*CSW will initial, date and re-position this form in  chart as items are completed):  Yes   Patient/family provided with [place] Work Department's list of facilities offering this level of care within the geographic area requested by the patient (or if unable, by the patient's family).  Yes   Patient/family informed of their freedom to choose among providers that offer the needed level of care, that participate in Medicare, Medicaid or managed care program needed by the patient, have an available bed and are willing to accept the patient.  Yes   Patient/family informed of [facility]'s ownership interest in [facility] and [facility], as well as of the fact that they are under no obligation to receive care at these facilities.  PASRR submitted to EDS on       PASRR number received on       Existing PASRR number confirmed on 05/14/16     FL2 transmitted to all facilities in geographic area requested by pt/family on 05/14/16     FL2 transmitted to all facilities within larger geographic area on       Patient informed that his/her managed care company has contracts with or will negotiate with certain facilities, including the  following:        Yes   Patient/family informed of bed offers received.  Patient chooses bed at [facility]     Physician recommends and patient chooses bed at      Patient to be transferred to   on  .  Patient to be transferred to facility by       Patient family notified on   of transfer.  Name of family member notified:        PHYSICIAN       Additional Comment:    _______________________________________________ Standley Brooking, LCSW 05/15/2016, 11:56 AM

## 2016-05-15 NOTE — Progress Notes (Signed)
MBS completed = full report to follow.  Pt with functional oropharyngeal swallow ability. No aspiration or penetration of any consistency tested *thin, nectar, pudding, solid, barium with thin.  Minimal oral residual of thin x1 prematurely spilled into pharynx with minimal retention at vallecular space.  Upon esophageal sweep, pt's esophagus appeared clear.  Recommend continue regular/thin with general precautions.  No follow up indicated. Educated pt and spouse using teach back and live video. Luanna Salk, [facility] SLP [phone]

## 2016-05-15 NOTE — Progress Notes (Addendum)
SLP received new order for swallow evaluation due to pt report of dysphagia to pills and coughing with liquids and now with new infiltrate.  Will proceed with MBS to allow instrumental evaluation.  RN made aware via phone.  Thanks  Luanna Salk, [place] [facility] SLP     [phone]

## 2016-05-15 NOTE — Progress Notes (Signed)
Spoke with family at bedside. They were planning for SNF at d/c but now want to be home for Christmas. They are in agreement with [facility] services through [facility]. Contacted [facility] for referral, awaiting final orders, planning for RN, PT, CSW. Also requested [place] deliver 3n1 and RW to room.

## 2016-05-15 NOTE — Care Management Important Message (Signed)
Important Message  Patient Details  Name: Kristina Meyer MRN: [id number] Date of Birth: Oct 23, 1940   Medicare Important Message Given:  Yes    [name] 05/15/2016, 10:06 AMImportant Message  Patient Details  Name: Kristina Meyer MRN: [id number] Date of Birth: [birth year]   Medicare Important Message Given:  Yes    [name] 05/15/2016, 10:06 AM

## 2016-05-15 NOTE — Progress Notes (Signed)
Physical Therapy Treatment Patient Details Name: Kristina Meyer MRN: [id number] DOB: 1941-05-16 Today's Date: 05/15/2016    History of Present Illness 75 yo woman with hx of HTN and AML receiving decitabine; adm [facility] on 04/23/16 with c/o progressive weakness, lethargy, fever, and malaise; Pt found to have LLL pna (HCAP), L pleural effusion, transferred  to SDU on 11/28 d/t tachycardia and worsening dyspnea, intubated 11/29, ext 12/14, s/p thoracentesis-chest tube 11/30-12/13; has some episodes of confusion per chart, referred to PT d/t significant deconditioning;    PT Comments    Pt OOB in recliner on RA at 100% and HR 84. Assisted with amb to bathroom using RW.  Pt required instruction on proper use as she has never used on ebefore.  Unsteady gait required Min Assist.  Weakness due to extended hospital stay.  Pt was able to amb in hallway.  Progressing well.   Follow Up Recommendations  SNF;Home health PT (spouse plans to take pt home and feels comfortable assisting her at her current level.  "I want her home for Christmas".  Spouse declines any SNF rec.)     Equipment Recommendations  Rolling walker with 5" wheels;3in1 (PT)    Recommendations for Other Services       Precautions / Restrictions Precautions Precautions: Fall Restrictions Weight Bearing Restrictions: No    Mobility  Bed Mobility               General bed mobility comments: OOB in recliner  Transfers Overall transfer level: Needs assistance Equipment used: Rolling walker (2 wheeled) Transfers: Sit to/from [facility] Sit to Stand: Min assist Stand pivot transfers: Min assist       General transfer comment: 25% VC's on proper hand placement and 50% VC's safety with turns using walker.  Unsteady esp with turns.  Advised spouse to be "hands on" when ever pt gets up.   Ambulation/Gait Ambulation/Gait assistance: Min assist Ambulation Distance (Feet): 58 Feet Assistive device: Rolling walker  (2 wheeled) Gait Pattern/deviations: Step-to pattern;Decreased step length - right;Decreased step length - left;Step-through pattern;Decreased stride length;Narrow base of support Gait velocity: decreased   General Gait Details: mild unsteady gait with noted B LE weakness due to extended hospital stay.  RA 100% with 1/4 DOE.  Avg HR 110 with activity.  Remarkably performed well.    Stairs            Wheelchair Mobility    Modified Rankin (Stroke Patients Only)       Balance                                    Cognition Arousal/Alertness: Awake/alert Behavior During Therapy: WFL for tasks assessed/performed Overall Cognitive Status: Within Functional Limits for tasks assessed                      Exercises      General Comments        Pertinent Vitals/Pain Pain Assessment: No/denies pain    Home Living                      Prior Function            PT Goals (current goals can now be found in the care plan section) Progress towards PT goals: Progressing toward goals    Frequency    Min 3X/week      PT Plan Current  plan remains appropriate    Co-evaluation             End of Session Equipment Utilized During Treatment: Gait belt Activity Tolerance: Patient tolerated treatment well Patient left: in chair;with chair alarm set;with family/visitor present     Time: [id number] PT Time Calculation (min) (ACUTE ONLY): 25 min  Charges:  $Gait Training: 8-22 mins $Therapeutic Activity: 8-22 mins                    G Codes:      Rica Koyanagi  PTA [facility]  Acute  Rehab Pager      [phone]

## 2016-05-15 NOTE — Progress Notes (Signed)
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 Reported progressive weakness, lethargy, fever 103, and malaise. Also developed a non-productive cough. Presented for eval that revealed LLL infiltrate. She is admitted to hospitalist service on 11/27,.  Flu screen, pneumococcal PNA, legionella PNA screens negative.  Hgb was 6.7, received PRBC. She started cefepime and azithro. All cx data so far negative.   On 11/28 has continued to have fever, WBC dropping,she became neutropenia,  has had some intermittent episodes of hypotension. She received IVF and abx were changed to vanco + imipenem.  PCCM consulted to eval , patient has significant pleural effusion, developed worsening respiratory status, she had bedside thoracentesis on 11/29,   she continue deteriorate and was then intubated on 11/30, she was put on pressors due to hypotension. She was admitted to ICU. She underwent bronchoscopy on 12/4, she has chest tube placed, she eventually showed improvement and was extubated on 12/14, patient code status changed to DNR.  Patient is transferred back to hospitalist service on 12/16. She also has chf , lvef 35%, cardiology following.   She is improving, initially planned to go to snf, now she wants to go home on 12/20  With home health.  repeat cxr on 12/18 with ? New infiltrate left lower lobe, but patient clinically looks good, if continue do well can d/c on oral abx and close follow up with repeat cxr at follow up, I have discussed this with patient and her husband, they are in agreement.  LINES/TUBES: Port-s-cath >>  OETT 11/29 >>12/14 Left IJ CVL 11/29 >>12/18 Chest tube (L) 11/30 >> 12/13  SIGNIFICANT EVENTS: 11/29 - thora. Incomplete drainage of left pleural space 11/30 - Left Chest tube placed.  12/04 -  Bronch 12/08 - Required IVF for low bp. Lasix held 12/09 - new Rt distal forearm redness x few days. Not on pressors. Failed SBT yesterday after 3h with agitation/desaturation /tacycardia. Currently on fent 50 and doing sbt and followng commands with calm. Fever coming down, abx d/c'd  12/10 - failed SBT, WBC up. Got low bp with lasix and given albumin. 1 unit prbc this AM for hgb 6.9.  12/12 - weaned on 12/5 12/13 - chest tube fell out 12/14 extubated 12/14 rt thora 1050 cc clear   CULTURES: Blood 11/27 >>  (-) Urine 11/27 >> insignificant growth Blood 11/28 >> (-) Urine 11/28 >> (-) Viral resp panel 11/28 >> neg Pleural fluid 11/29>>>no orgs but abundant wbc BAL bacterial 12/4 >> negative BAL fungal 12/4 >> scant candida guilliermondii BAL afb12/4 >> BAL respiratory panel 12/4 > negative Pleural fluid 12/14>>  ANTIBIOTICS: Cefepime 11/27 >> 11/28 azithro 11/27 >> 12/2 vanco 11/28 >> 12/9 meropenem 11/28 >> 12/9  anidulafungin 11/30 >> 12/11 Acyclovir 12/1 >> 12/11     HPI/Recap of past 24 hours:  Report feeling better, she denies chest pain, no sob, on room air.no edema Report the cream helped hemorroids, She walked today, she now want to go home for christmas, she does not want to go to rehab. Husband in room.  Assessment/Plan: Principal Problem:   HCAP (healthcare-associated pneumonia) Active Problems:   Pancytopenia ([place])   Hypokalemia   Dehydration   Pneumonia involving left lung   Severe sepsis (HCC)   Acute respiratory failure with hypoxemia (HCC)   Encephalopathy acute   S/P thoracentesis  Septic shock (HCC)   Pleural effusion, left  Lobar pneumonia, bilateral pleural effusion, respiratory failure required intubation, bronchoscopy, bilateral thoracentesis and chest tube. Patient presented with fever of 103, sinus tachycardia, cough, lethargy, she initially admitted to hospitalist service , she decompensated the next day and transferred to icu  intubated. All culture including blood, sputum, urine, pleural fluids, bronch specimen unremarkable, except scant candida guilliermondii She received vanc/cefepime/meropenem/zithromax/anidulafungin/acyclovir initially due to immunosuppressed state with recent chemotherapy for AML. And she did have neurtropenia from 11/29 to 12/9. She has improved, now off all abx, extubated, on room air, repeat cxr on 12/16 with bilateral airspace opacities, small effusions ( last thoracentesis was on 12/14) Repeat cxr on 12/18 effusions almost resolved, but with new infiltrate in left lower lobe? Though patient clinically continue to improve. Due to concerning for aspiration, MBS done on 12/18" Pt with functional oropharyngeal swallow ability. No aspiration or penetration of any consistency tested .Recommend continue regular/thin with general precautions" She is started on augmentin on 12/19, likely can discharge on augmentin and outpatient follow up chest x ray to ensure resolution of left lower lobe infiltrate that is seen on cxr from 12/18.  Acute systolic CHF, EKG with lBBB Patient denies prior cardiac history, no prior ekg or echo before this hospitalization. Echo this hospitalization LVEF35% Acute chf from infection? From chemo? She denies chest pain, no edema,  cardiology following, on coreg/lisinopril/spironolectone. Family want to follow up with Dr Stanford Breed in [place] office.  Hypokalemia: replace k daily  Pancytopenia:  She presented with pancytopenia wbc as low as 0.4, hgb aas low as  6.7, plt as low as 15,  Initial pancytopenia likely combination from chemo and infection She received prbc transfusion on 11/27, 11/30, 12/5, 12/10, 12/14 total of 6 units so far She received plt transfusionx3  on 11/29 Counts have now improved.   AML:  last chemo with decitabine on 11/21,  bone marrow on 12/12 no significant blast cells. Pleural fluids cytology negative for blast cells  bronch cytology negative  for malignancy  oncology following has follow up with Dr Martha Clan on 1/3.  Deconditioning: PT eval, patient now want to go home with home health  Code Status: DNR  Family Communication: patient and husband  Disposition Plan: home with home health on 12/20 if continue to improve.   Consultants:  Critical care  Oncology  cardiology  Procedures:  See above  Antibiotics:  See above   Objective: BP (!) 124/56 (BP Location: Right Arm)   Pulse 83   Temp 99.3 F (37.4 C) (Oral)   Resp 15   Ht 5\' 2"  (1.575 m)   Wt 55.4 kg (122 lb 2.2 oz)   SpO2 100%   BMI 22.34 kg/m   Intake/Output Summary (Last 24 hours) at 05/15/16 1509 Last data filed at 05/15/16 1417  Gross per 24 hour  Intake              490 ml  Output                0 ml  Net              490 ml   Filed Weights   05/14/16 0340 05/14/16 1033 05/15/16 0531  Weight: 55.6 kg (122 lb 9.2 oz) 55.3 kg (122 lb) 55.4 kg (122 lb 2.2 oz)    Exam:   General:  Frail but NAD, on room air  Cardiovascular: RRR  Respiratory: good air movement, no wheezing, no rales, no rhonchi  Abdomen: Soft/ND/NT, positive BS  Musculoskeletal: No Edema  Neuro: aaox3  Data Reviewed: Basic Metabolic Panel:  Recent Labs Lab 05/09/16 0337 05/10/16 0451 05/11/16 0343 05/12/16 0546 05/13/16 0514 05/14/16 0316 05/15/16 0342  NA 139 140 138 140 137 139 142  K 3.2* 3.2* 3.3* 2.8* 3.2* 4.3 3.3*  CL 101 101 101 101 105 106 107  CO2 33* 35* 30 27 26 24 27   GLUCOSE 147* 126* 116* 102* 101* 109* 120*  BUN 22* 21* 15 9 8 9 10   CREATININE 0.37* 0.35* 0.39* 0.48 0.43* 0.53 0.53  CALCIUM 7.9* 8.2* 7.8* 7.9* 7.7* 7.7* 8.3*  MG 1.8 1.8  --  2.5*  --   --   --   PHOS 3.7  --   --  3.3  --   --   --    Liver Function Tests:  Recent Labs Lab 05/10/16 1147 05/11/16 0343  AST  --  26  ALT  --  20  ALKPHOS  --  116  BILITOT  --  1.2  PROT 5.6* 5.3*  ALBUMIN  --  2.3*   No results for input(s): LIPASE, AMYLASE in the last  168 hours. No results for input(s): AMMONIA in the last 168 hours. CBC:  Recent Labs Lab 05/09/16 0337  05/11/16 0343 05/12/16 0453 05/13/16 0514 05/14/16 0316 05/15/16 0342  WBC 7.1  < > 16.7* 14.9* 10.7* 8.3 7.2  NEUTROABS 4.9  --   --   --  7.7 5.0 4.1  HGB 7.6*  < > 9.6* 9.9* 10.2* 10.7* 10.8*  HCT 23.2*  < > 29.3* 29.3* 30.6* 32.5* 32.9*  MCV 92.4  < > 90.2 89.6 89.7 90.0 90.4  PLT 174  < > 286 314 357 396 422*  < > = values in this interval not displayed. Cardiac Enzymes:   No results for input(s): CKTOTAL, CKMB, CKMBINDEX, TROPONINI in the last 168 hours. BNP (last 3 results) No results for input(s): BNP in the last 8760 hours.  ProBNP (last 3 results) No results for input(s): PROBNP in the last 8760 hours.  CBG:  Recent Labs Lab 05/13/16 0416 05/13/16 0739 05/13/16 1130 05/13/16 1558 05/13/16 1944  GLUCAP 105* 109* 108* 104* 112*    Recent Results (from the past 240 hour(s))  Body fluid culture     Status: None   Collection Time: 05/10/16 10:30 AM  Result Value Ref Range Status   Specimen Description PLEURAL  Final   Special Requests NONE  Final   Gram Stain   Final    FEW WBC PRESENT, PREDOMINANTLY PMN NO ORGANISMS SEEN    Culture   Final    NO GROWTH 3 DAYS Performed at [facility]    Report Status 05/14/2016 FINAL  Final     Studies: Dg Chest 2 View  Result Date: 05/14/2016 CLINICAL DATA:  Weakness. EXAM: CHEST  2 VIEW COMPARISON:  05/12/2016. FINDINGS: Interval removal of left IJ line. PowerPort catheter noted with lead tip projected right ventricle. Heart size normal. Prominent left base new infiltrate consistent with pneumonia. Heart size normal. Previously identified bilateral interstitial prominence has cleared. Small left pleural effusion cannot be excluded. No pneumothorax IMPRESSION: 1. Interim removal of previously identified left IJ line . PowerPort catheter and stable position. 2. Prominent new infiltrate in the left lung base  consistent with pneumonia no on today's exam. Prior bilateral pulmonary interstitial prominence suggesting CHF has cleared. Small residual left pleural effusion. Electronically Signed   By: Marcello Moores  Register  On: 05/14/2016 17:15    Scheduled Meds: . alteplase  4 mg Intracatheter Once  . amoxicillin-clavulanate  1 tablet Oral Q12H  . atorvastatin  10 mg Per Tube QHS  . carvedilol  3.125 mg Oral BID WC  . famotidine  20 mg Oral BID  . hydrocortisone   Rectal BID  . lisinopril  5 mg Oral Daily  . multivitamin with minerals  1 tablet Oral Daily  . sodium chloride  500 mL Intravenous Once  . sodium chloride flush  10-40 mL Intracatheter Q12H  . spironolactone  12.5 mg Oral Daily  . temazepam  7.5 mg Oral Once    Continuous Infusions:   Time spent: 108mins  Agnes Probert MD, PhD  [facility] Pager [phone]. If 7PM-7AM, please contact night-coverage at [email or web address], password [facility] 05/15/2016, 3:09 PM  LOS: 22 days

## 2016-05-16 ENCOUNTER — Other Ambulatory Visit: Payer: Medicare Other

## 2016-05-16 ENCOUNTER — Other Ambulatory Visit: Payer: Self-pay | Admitting: Hematology & Oncology

## 2016-05-16 ENCOUNTER — Inpatient Hospital Stay (HOSPITAL_COMMUNITY): Payer: Medicare Other

## 2016-05-16 DIAGNOSIS — C9300 Acute monoblastic/monocytic leukemia, not having achieved remission: Secondary | ICD-10-CM

## 2016-05-16 LAB — BASIC METABOLIC PANEL
Anion gap: 7 (ref 5–15)
BUN: 12 mg/dL (ref 6–20)
CHLORIDE: 107 mmol/L (ref 101–111)
CO2: 26 mmol/L (ref 22–32)
CREATININE: 0.51 mg/dL (ref 0.44–1.00)
Calcium: 8.6 mg/dL — ABNORMAL LOW (ref 8.9–10.3)
GFR calc Af Amer: >60 mL/min (ref >60)
GFR calc non Af Amer: >60 mL/min (ref >60)
GLUCOSE: 122 mg/dL — AB (ref 65–99)
Potassium: 3.8 mmol/L (ref 3.5–5.1)
Sodium: 140 mmol/L (ref 135–145)

## 2016-05-16 LAB — CBC WITH DIFFERENTIAL/PLATELET
BASOS ABS: 0.1 10*3/uL (ref 0.0–0.1)
BASOS PCT: 1 %
EOS ABS: 0 10*3/uL (ref 0.0–0.7)
Eosinophils Relative: 0 %
HCT: 31.6 % — ABNORMAL LOW (ref 36.0–46.0)
Hemoglobin: 10.5 g/dL — ABNORMAL LOW (ref 12.0–15.0)
LYMPHS ABS: 2 10*3/uL (ref 0.7–4.0)
Lymphocytes Relative: 34 %
MCH: 29.7 pg (ref 26.0–34.0)
MCHC: 33.2 g/dL (ref 30.0–36.0)
MCV: 89.3 fL (ref 78.0–100.0)
Monocytes Absolute: 0.9 10*3/uL (ref 0.1–1.0)
Monocytes Relative: 16 %
NEUTROS ABS: 2.8 10*3/uL (ref 1.7–7.7)
Neutrophils Relative %: 49 %
PLATELETS: 393 10*3/uL (ref 150–400)
RBC: 3.54 MIL/uL — ABNORMAL LOW (ref 3.87–5.11)
RDW: 21.7 % — ABNORMAL HIGH (ref 11.5–15.5)
WBC: 5.8 10*3/uL (ref 4.0–10.5)

## 2016-05-16 LAB — MAGNESIUM: MAGNESIUM: 2.1 mg/dL (ref 1.7–2.4)

## 2016-05-16 MED ORDER — LISINOPRIL 5 MG PO TABS: 5.0000 mg | ORAL_TABLET | Freq: Every day | ORAL | 0 refills | Status: DC

## 2016-05-16 MED ORDER — HEPARIN SOD (PORK) LOCK FLUSH 100 UNIT/ML IV SOLN
500.0000 [IU] | INTRAVENOUS | Status: AC | PRN
  Administered 2016-05-16: 500 [IU]

## 2016-05-16 MED ORDER — CARVEDILOL 3.125 MG PO TABS: 3.1250 mg | ORAL_TABLET | Freq: Two times a day (BID) | ORAL | 0 refills | Status: DC

## 2016-05-16 MED ORDER — SPIRONOLACTONE 25 MG PO TABS: 12.5000 mg | ORAL_TABLET | Freq: Every day | ORAL | 0 refills | Status: DC

## 2016-05-16 MED ORDER — AMOXICILLIN-POT CLAVULANATE 875-125 MG PO TABS: 1.0000 | ORAL_TABLET | Freq: Two times a day (BID) | ORAL | 0 refills | Status: AC

## 2016-05-16 NOTE — Progress Notes (Signed)
Ms. Stockford says she is going home today. I think this is fantastic news. I'm so excited for her. Hopefully, she will not have to go to rehabilitation from home.  She walked a little bit yesterday. She is eating okay. She does not like the food in the hospital. She thinks that she will eat a whole lot better when she goes home.  She's had no fever. She's had no cough or shortness of breath. It probably would not be a bad idea to get a chest x-ray when she leaves. It would be nice to have a baseline on her.  She's had no bleeding. She's had no diarrhea. She's had no leg swelling. The cellulitis on the right forearm is resolved.  Her lab work today shows her white cell count be 5.8. Hemoglobin 10.5. Platelet count 393,000.  Her potassium is 3.8. Creatinine 0.51.  She denies any count of cough. She denies any increased shortness of breath. She is not using any supplemental oxygen.  On her physical exam, her blood pressure is 128/57. Her pulse is 75. Her temperature is 98.7. Oxygen saturations 100%. She has no mucositis. There is no intraoral bleeding. There is no adenopathy on her neck. Lungs are clear bilaterally. There may be some slight decrease at the bases. I hear no wheezes. Cardiac exam regular rate and rhythm with no murmurs, rubs or bruits. Abdomen is soft. Bowel sounds are present. There is no palpable liver or spleen tip. Extremities shows minimal edema in her lower legs. Skin exam shows no rashes, ecchymoses or petechia.  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 By her bone marrow test a couple weeks ago, she is in remission. This, is a true blessing. Now that she is able to go home, I think she will get better and get stronger much more quickly.  I'm sure that, at some point in the future, she will recur. We will have to watch her closely.  I know that she would love to be home for Christmas. I know this is the best gift that she could've ever received.  For right now, I will plan to see her  back in the office the first week in January.  I really like to thank everybody who was involved with her care. Everybody worked together for her benefit and that benefit is now for her to go home.  As always, she asked me to pray with her. I prayed with her, we had a very good prayer session. She always enjoys this.  Lattie Haw, MD  Oswaldo Milian 7:14

## 2016-05-16 NOTE — Progress Notes (Signed)
Pt stated that she will make her follow up appointment with Dr. Marin Olp.

## 2016-05-16 NOTE — Progress Notes (Signed)
Nutrition Follow-up  DOCUMENTATION CODES:   Not applicable  INTERVENTION:  - Continue to encourage PO intakes.  - RD will continue to monitor for additional nutrition-related needs if pt unable to d/c today.   NUTRITION DIAGNOSIS:   Inadequate oral intake related to inability to eat as evidenced by NPO status. -diet advanced 12/15 and appetite slowly improving.   GOAL:   Patient will meet greater than or equal to 90% of their needs -unmet  MONITOR:   PO intake, Weight trends, Labs, I & O's  ASSESSMENT:   75 y.o. 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 The patient was in her usual state of health, got her Cytovene infusions last last Tuesday, and then over the last 3 or 4 days has developed progressive weakness and "just feeling awful". At first she thought she was just getting anemic again because of how tired she was over the weekend, but then tonight at 2AM she woke with fever to 102F, vomited, had worse malaise and chills and came to the ER.   She has had no dysuria or other urinary irritative symptoms.  She has had a new nonproductive cough this week.  No nose bleeds, bleeding from gums.  12/20 Per chart review, pt consumed 50% of lunch and 20% of dinner 12/18 and 25% of breakfast, 50% of lunch, and 50% of dinner yesterday. She had an egg biscuit/sandwich from [employer] this AM and states it tasted "pretty good." Her appetite is still decreased and taste remains poor with some slight improvement. Pt and husband, who is at bedside, reports that pt may d/c today. Weight stable from previous RD visit.  Medications reviewed; 20 mg oral Pepcid BID, daily multivitamin with minerals, PRN IV Zofran 40 mEq oral KCl x1 dose yesterday, 12.5 mg oral Aldactone/day.  Labs reviewed; Ca: 8.6 mg/dL.     12/18 - Diet advanced to Regular on 12/15 with no intakes documented since that time.  - Pt was moved from 2 [place] to [place] earlier this AM.  - She had a few bites of breakfast this AM: toast, egg, banana, and drank 100% of diet Coke.  - Pt reports that nothing sounds good or tastes good to her.  - Talked with pt about this as it relates to intubation and multiple medications and antibiotics.  - Pt's husband is going to bring her a steak biscuit from [employer] for lunch and she also request vanilla ice cream and diet Coke; ordered these items for pt.  - Encouraged pt to continue consuming as much as she is able to meals.  - Reviewed items on the menu with her briefly.  - Weight -8.1 kg from previous assessment with diuretics.    12/15 - Pt extubated yesterday shortly after 1200 and OGT removed at that time.  - Per notes, plan for swallow evaluation today.  - Estimated nutrition needs updated based on extubation.  - RD will follow-up on Monday and monitor for nutrition-related needs based on diet advancement.  - Weight -1.2 kg from yesterday.  - Plan to change from ICU-status to SDU-status today.    Diet Order:  Diet regular Room service appropriate? Yes; Fluid consistency: Thin  Skin:  Reviewed, no issues  Last BM:  12/19  Height:   Ht Readings from Last 1 Encounters:  05/14/16 5\' 2"  (1.575 m)    Weight:   Wt Readings from Last 1 Encounters:  05/16/16 121  lb 14.6 oz (55.3 kg)    Ideal Body Weight:  50 kg  BMI:  Body mass index is 22.3 kg/m.  Estimated Nutritional Needs:   Kcal:  1585-1775 (25-28 kcal/kg)  Protein:  63-76 grams (1-1.2 grams/kg)  Fluid:  1.5L/day  EDUCATION NEEDS:   No education needs identified at this time    Jarome Matin, MS, RD, LDN, CNSC Inpatient Clinical Dietitian Pager # [phone] After hours/weekend pager # [phone]

## 2016-05-16 NOTE — Progress Notes (Signed)
Discharge instructions reviewed with pt and husband. Prescriptions given. Tele taken off. Pt dressed in her clothes and wheeled off of unit. Husband to take her home.

## 2016-05-16 NOTE — Discharge Summary (Addendum)
Physician Discharge Summary  Ondria Bomba [id number] DOB: 1940/05/29 DOA: 04/23/2016  PCP: Jobe Marker, MD  Admit date: 04/23/2016 Discharge date: 05/16/2016  Admitted From:Home Disposition:Home with home care  Recommendations for Outpatient Follow-up:  1. Follow up with PCP in 1-2 weeks 2. Please obtain BMP/CBC in one week 3. 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 Reported progressive weakness, lethargy, fever 103, and malaise. Also developed a non-productive cough. Presented for eval that revealed LLL infiltrate. She is admitted to hospitalist service on 11/27,.  Flu screen, pneumococcal PNA, legionella PNA screens negative.  Hgb was 6.7, received PRBC. She started cefepime and azithro. All cx data so far negative.   On 11/28 has continued to have fever, WBC dropping,she became neutropenia,  has had some intermittent episodes of hypotension. She received IVF and abx were changed to vanco + imipenem.  PCCM consulted to eval , patient has significant pleural effusion, developed worsening respiratory status, she had bedside thoracentesis on 11/29,   she continue deteriorate and was then intubated on 11/30, she was put on pressors due to hypotension. She was admitted to ICU. She underwent bronchoscopy on 12/4, she has chest tube placed, she eventually showed improvement and was extubated on 12/14, patient code status changed to DNR.  Patient is transferred back to hospitalist service on 12/16. She also has chf , lvef 35%, cardiology evaluated the patient and adjust medication. She is improving, initially planned to go to snf, now she wants to go home with home care services. repeat cxr on 12/18 with ? New infiltrate left  lower lobe,   Patient reported feeling better today and was to go home with home care service. Her husband at bedside. Patient denied fever, chills, headache, dizziness, shortness of breath, chest pain. Her oxygen saturation acceptable in room air. Plan to discharge with oral antibiotics for 7 days. I also advised to family's for repeating chest x-ray in 2-3 weeks to monitor resolution of abnormal chest x-ray finding. Blood counts improved. The patient and her husband verbalized understanding of follow-up instruction. I discussed with the case manager team for discharge planning. Patient was advised to follow-up with cardiologist, oncologist PCP and her doctors as an outpatient. Medically stable this time.  LINES/TUBES: Port-s-cath >> OETT 11/29 >>12/14 Left IJ CVL 11/29 >>12/18 Chest tube (L) 11/30 >> 12/13  SIGNIFICANT EVENTS: 11/29 - thora. Incomplete drainage of left pleural space 11/30 - Left Chest tube placed.  12/04 - Bronch 12/08 - Required IVF for low bp. Lasix held 12/09 - new Rt distal forearm redness x few days. Not on pressors. Failed SBT yesterday after 3h with agitation/desaturation /tacycardia. Currently on fent 50 and doing sbt and followng commands with calm. Fever coming down, abx d/c'd  12/10 - failed SBT, WBC up. Got low bp with lasix and given albumin. 1 unit prbc this AM for hgb 6.9.  12/12 - weaned on 12/5 12/13 - chest tube fell out 12/14 extubated 12/14 rt thora 1050 cc clear   CULTURES: Blood 11/27 >>(-) Urine 11/27 >> insignificant growth Blood 11/28 >> (-) Urine 11/28 >> (-) Viral resp panel 11/28 >> neg Pleural fluid 11/29>>>no orgs but abundant wbc BAL bacterial 12/4 >> negative BAL fungal 12/4 >> scant candida guilliermondii BAL afb12/4 >> BAL respiratory panel 12/4 >  negative Pleural fluid 12/14>>  ANTIBIOTICS: Cefepime 11/27 >> 11/28 azithro 11/27 >> 12/2 vanco 11/28 >> 12/9 meropenem 11/28 >>12/9  anidulafungin 11/30 >>  12/11 Acyclovir 12/1 >>12/11   Discharge Diagnoses:  Principal Problem:   HCAP (healthcare-associated pneumonia) Active Problems:   Pancytopenia ([place])   Hypokalemia   Dehydration   Pneumonia involving left lung   Severe sepsis (HCC)   Acute respiratory failure with hypoxemia (HCC)   Encephalopathy acute   S/P thoracentesis   Septic shock (HCC)   Pleural effusion, left    Discharge Instructions  Discharge Instructions    Call MD for:  difficulty breathing, headache or visual disturbances    Complete by:  As directed    Call MD for:  extreme fatigue    Complete by:  As directed    Call MD for:  hives    Complete by:  As directed    Call MD for:  persistant dizziness or light-headedness    Complete by:  As directed    Call MD for:  persistant nausea and vomiting    Complete by:  As directed    Call MD for:  severe uncontrolled pain    Complete by:  As directed    Call MD for:  temperature >100.4    Complete by:  As directed    Diet - low sodium heart healthy    Complete by:  As directed    Discharge instructions    Complete by:  As directed    Please check chest Xray in 2-3 weeks to make sure it is improved. Follow up with your PCP, cardiologist and oncologist.   Face-to-face encounter (required for Medicare/Medicaid patients)    Complete by:  As directed    I Xu,Fang certify that this patient is under my care and that I, or a nurse practitioner or physician's assistant working with me, had a face-to-face encounter that meets the physician face-to-face encounter requirements with this patient on 05/15/2016. The encounter with the patient was in whole, or in part for the following medical condition(s) which is the primary reason for home health care (List medical condition): FTT   The encounter with the patient was in whole, or in part, for the following medical condition, which is the primary reason for home health care:  FTT   I certify that, based on my findings, the  following services are medically necessary home health services:   Nursing Physical therapy     Reason for Medically Necessary Home Health Services:  Skilled Nursing- Change/Decline in Patient Status   My clinical findings support the need for the above services:  Shortness of breath with activity   Further, I certify that my clinical findings support that this patient is homebound due to:  Shortness of Breath with activity   Home Health    Complete by:  As directed    To provide the following care/treatments:   PT OT RN Social work     Increase activity slowly    Complete by:  As directed      Allergies as of 05/16/2016      Reactions   Other Other (See Comments)   GENERAL Anesthesia, vomiting      Medication List    STOP taking these medications   hydrochlorothiazide 25 MG tablet Commonly known as:  HYDRODIURIL     TAKE these medications   ALPRAZolam 0.5 MG tablet Commonly known as:  XANAX TAKE 1 TABLET BY MOUTH 3 TIMES A DAY AS  NEEDED What changed:  See the new instructions.   amoxicillin-clavulanate 875-125 MG tablet Commonly known as:  AUGMENTIN Take 1 tablet by mouth every 12 (twelve) hours.   aspirin 81 MG tablet Take 81 mg by mouth daily.   atorvastatin 20 MG tablet Commonly known as:  LIPITOR Take 10 mg by mouth at bedtime.   CALCIUM 1200+D3 PO Take 1 tablet by mouth daily.   carvedilol 3.125 MG tablet Commonly known as:  COREG Take 1 tablet (3.125 mg total) by mouth 2 (two) times daily with a meal.   cholecalciferol 1000 units tablet Commonly known as:  VITAMIN D Take 1,000 Units by mouth daily.   lidocaine-prilocaine cream Commonly known as:  EMLA Apply 1 application topically as needed. Place quarter sized amount of cream directly on portacath and cover with saran wrap at least 1 - 1 1/2 hours prior to procedure.   lisinopril 5 MG tablet Commonly known as:  PRINIVIL,ZESTRIL Take 1 tablet (5 mg total) by mouth daily. Start taking on:   05/17/2016   MULTIVITAMIN ADULT Tabs Take 1 tablet by mouth daily.   orphenadrine 100 MG tablet Commonly known as:  NORFLEX Take 1 tablet (100 mg total) by mouth at bedtime as needed for muscle spasms.   PARoxetine 20 MG tablet Commonly known as:  PAXIL Take 20 mg by mouth daily.   potassium chloride SA 20 MEQ tablet Commonly known as:  K-DUR,KLOR-CON Take 1 pill twice a day for 7 days, then 1 pill a day.   prochlorperazine 5 MG tablet Commonly known as:  COMPAZINE Take 1 tablet (5 mg total) by mouth every 6 (six) hours as needed for nausea or vomiting.   spironolactone 25 MG tablet Commonly known as:  ALDACTONE Take 0.5 tablets (12.5 mg total) by mouth daily. Start taking on:  05/17/2016   sulfamethoxazole-trimethoprim 800-160 MG per tablet Commonly known as:  BACTRIM DS Take 1 tablet by mouth daily.            Durable Medical Equipment        Start     Ordered   05/15/16 1359  For home use only DME 3 n 1  Once     05/15/16 1359   05/15/16 1359  For home use only DME Walker rolling  Once    Question:  Patient needs a walker to treat with the following condition  Answer:  Pneumonia   05/15/16 1359     Follow-up Information    [facility] Follow up.   Why:  nurse, physical therapy, social worker Contact information: [place] [address] [place] Follow up.   Why:  rolling walker and 3n1 Contact information: [place] [address] [phone]        Nicholas Paul Wendling, DO Follow up in 1 week(s).   Specialty:  Family Medicine Why:  hospital discharge follow up , repeat basic lab works including cbc/bmp at followup repeat chest x ray at follow up to ensure resolution of pneumonia.  Contact information: [place] [address] [phone]        Brian Crenshaw, MD Follow up in 3 week(s).   Specialty:   Cardiology Why:  for heart failure, will need to repeat echocardiogram in  a few months. Contact information: [place] [address] [phone]  Volanda Napoleon, MD. Schedule an appointment as soon as possible for a visit in 2 week(s).   Specialty:  Oncology Contact information: [place] [address] [phone]          Allergies  Allergen Reactions  . Other Other (See Comments)    GENERAL Anesthesia, vomiting    Consultations: -Pulmonologist, cardiologist, oncologist  Subjective: The patient was seen and examined at bedside. She was sitting on chair. Reported feeling better and asking to go home today. Denied fever, chills, headache, dizziness, chest pain, shortness of breath, abdominal pain. Husband at bedside Discharge Exam: Vitals:   05/16/16 0300 05/16/16 0822  BP: (!) 121/50 (!) 128/57  Pulse: 93 75  Resp: 19   Temp: 98.7 F (37.1 C)    Vitals:   05/15/16 1416 05/15/16 2205 05/16/16 0300 05/16/16 0822  BP: (!) 124/56 (!) 132/55 (!) 121/50 (!) 128/57  Pulse: 83 77 93 75  Resp: 15 16 19    Temp: 99.3 F (37.4 C) 98.8 F (37.1 C) 98.7 F (37.1 C)   TempSrc: Oral Oral Oral   SpO2: 100% 100% 100%   Weight:   55.3 kg (121 lb 14.6 oz)   Height:        General: Pt is alert, awake, not in acute distress Cardiovascular: RRR, S1/S2 +, no rubs, no gallops Respiratory: CTA bilaterally, no wheezing, no rhonchi Abdominal: Soft, NT, ND, bowel sounds + Extremities: no edema, no cyanosis Alert awake, nonfocal neurological exam.   The results of significant diagnostics from this hospitalization (including imaging, microbiology, ancillary and laboratory) are listed below for reference.     Microbiology: Recent Results (from the past 240 hour(s))  Body fluid culture     Status: None   Collection Time: 05/10/16 10:30 AM  Result Value Ref Range Status   Specimen Description PLEURAL  Final    Special Requests NONE  Final   Gram Stain   Final    FEW WBC PRESENT, PREDOMINANTLY PMN NO ORGANISMS SEEN    Culture   Final    NO GROWTH 3 DAYS Performed at [facility]    Report Status 05/14/2016 FINAL  Final     Labs: BNP (last 3 results) No results for input(s): BNP in the last 8760 hours. Basic Metabolic Panel:  Recent Labs Lab 05/10/16 0451  05/12/16 0546 05/13/16 0514 05/14/16 0316 05/15/16 0342 05/16/16 0557  NA 140  < > 140 137 139 142 140  K 3.2*  < > 2.8* 3.2* 4.3 3.3* 3.8  CL 101  < > 101 105 106 107 107  CO2 35*  < > 27 26 24 27 26   GLUCOSE 126*  < > 102* 101* 109* 120* 122*  BUN 21*  < > 9 8 9 10 12   CREATININE 0.35*  < > 0.48 0.43* 0.53 0.53 0.51  CALCIUM 8.2*  < > 7.9* 7.7* 7.7* 8.3* 8.6*  MG 1.8  --  2.5*  --   --   --  2.1  PHOS  --   --  3.3  --   --   --   --   < > = values in this interval not displayed. Liver Function Tests:  Recent Labs Lab 05/10/16 1147 05/11/16 0343  AST  --  26  ALT  --  20  ALKPHOS  --  116  BILITOT  --  1.2  PROT 5.6* 5.3*  ALBUMIN  --  2.3*   No results for input(s):  LIPASE, AMYLASE in the last 168 hours. No results for input(s): AMMONIA in the last 168 hours. CBC:  Recent Labs Lab 05/12/16 0453 05/13/16 0514 05/14/16 0316 05/15/16 0342 05/16/16 0557  WBC 14.9* 10.7* 8.3 7.2 5.8  NEUTROABS  --  7.7 5.0 4.1 2.8  HGB 9.9* 10.2* 10.7* 10.8* 10.5*  HCT 29.3* 30.6* 32.5* 32.9* 31.6*  MCV 89.6 89.7 90.0 90.4 89.3  PLT 314 357 396 422* 393   Cardiac Enzymes: No results for input(s): CKTOTAL, CKMB, CKMBINDEX, TROPONINI in the last 168 hours. BNP: Invalid input(s): POCBNP CBG:  Recent Labs Lab 05/13/16 0416 05/13/16 0739 05/13/16 1130 05/13/16 1558 05/13/16 1944  GLUCAP 105* 109* 108* 104* 112*   D-Dimer No results for input(s): DDIMER in the last 72 hours. Hgb A1c  Recent Labs  05/14/16 0316  HGBA1C 5.9*   Lipid Profile No results for input(s): CHOL, HDL, LDLCALC, TRIG, CHOLHDL,  LDLDIRECT in the last 72 hours. Thyroid function studies No results for input(s): TSH, T4TOTAL, T3FREE, THYROIDAB in the last 72 hours.  Invalid input(s): FREET3 Anemia work up No results for input(s): VITAMINB12, FOLATE, FERRITIN, TIBC, IRON, RETICCTPCT in the last 72 hours. Urinalysis    Component Value Date/Time   COLORURINE AMBER (A) 04/23/2016 0419   APPEARANCEUR CLEAR 04/23/2016 0419   LABSPEC 1.019 04/23/2016 0419   PHURINE 5.0 04/23/2016 0419   GLUCOSEU NEGATIVE 04/23/2016 0419   HGBUR NEGATIVE 04/23/2016 0419   BILIRUBINUR NEGATIVE 04/23/2016 0419   KETONESUR NEGATIVE 04/23/2016 0419   PROTEINUR NEGATIVE 04/23/2016 0419   NITRITE NEGATIVE 04/23/2016 0419   LEUKOCYTESUR NEGATIVE 04/23/2016 0419   Sepsis Labs Invalid input(s): PROCALCITONIN,  WBC,  LACTICIDVEN Microbiology Recent Results (from the past 240 hour(s))  Body fluid culture     Status: None   Collection Time: 05/10/16 10:30 AM  Result Value Ref Range Status   Specimen Description PLEURAL  Final   Special Requests NONE  Final   Gram Stain   Final    FEW WBC PRESENT, PREDOMINANTLY PMN NO ORGANISMS SEEN    Culture   Final    NO GROWTH 3 DAYS Performed at [facility]    Report Status 05/14/2016 FINAL  Final     Time coordinating discharge: Over 30 minutes  SIGNED:   Rosita Fire, MD  [facility] 05/16/2016, 11:18 AM  If 7PM-7AM, please contact night-coverage [email or web address] Password [email or web address]

## 2016-05-23 ENCOUNTER — Other Ambulatory Visit: Payer: Medicare Other

## 2016-05-24 ENCOUNTER — Encounter (HOSPITAL_COMMUNITY): Payer: Self-pay

## 2016-05-30 ENCOUNTER — Other Ambulatory Visit: Payer: Medicare Other

## 2016-05-30 ENCOUNTER — Encounter: Payer: Self-pay | Admitting: Hematology & Oncology

## 2016-05-30 LAB — FUNGUS CULTURE WITH STAIN

## 2016-05-30 LAB — FUNGUS CULTURE RESULT

## 2016-05-30 LAB — FUNGAL ORGANISM REFLEX

## 2016-05-31 ENCOUNTER — Ambulatory Visit (INDEPENDENT_AMBULATORY_CARE_PROVIDER_SITE_OTHER): Payer: Medicare Other | Admitting: Family Medicine

## 2016-05-31 ENCOUNTER — Encounter: Payer: Self-pay | Admitting: Family Medicine

## 2016-05-31 VITALS — BP 114/62 | HR 91 | Temp 97.9°F | Ht 62.0 in | Wt 129.0 lb

## 2016-05-31 DIAGNOSIS — Z09 Encounter for follow-up examination after completed treatment for conditions other than malignant neoplasm: Secondary | ICD-10-CM

## 2016-05-31 DIAGNOSIS — L03113 Cellulitis of right upper limb: Secondary | ICD-10-CM

## 2016-05-31 MED ORDER — CEPHALEXIN 500 MG PO CAPS: 500.0000 mg | ORAL_CAPSULE | Freq: Three times a day (TID) | ORAL | 0 refills | Status: DC

## 2016-05-31 NOTE — Progress Notes (Signed)
Pre visit review using our clinic review tool, if applicable. No additional management support is needed unless otherwise documented below in the visit note. 

## 2016-05-31 NOTE — Progress Notes (Signed)
Chief Complaint  Patient presents with  . Hospitalization Follow-up    Pt was in hospital for PNA and pt reports rash om RT hand and arm        New Patient Visit SUBJECTIVE: HPI: Kristina Meyer is an 76 y.o.female who is being seen for establishing care.  The patient was previously seen at Dr. Judithe Modest office in [place].   Recently was admitted at [facility] for PNA. Admitted for almost 1 mo. She was discharged on 12/20 with a 14 day course of Augmentin, which she has finished. She is breathing normally now, without fevers, and cough has improved. She is having a dry cough which she attributes to starting Lisinopril by the cardiology team. She had this issue in the past and was switched to Losartan. She thinks she may have had an allergic reaction to it which is why she was changed to HCTZ.  Allergies  Allergen Reactions  . Other Other (See Comments)    GENERAL Anesthesia, vomiting    Past Medical History:  Diagnosis Date  . Acute systolic CHF (congestive heart failure) ([place])    a. 04/2016: EF 35%, Grade 2 DD, Severe HK, mild to moderate MR  . 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 Anxiety   . Heart murmur    echo done 20 yrs ago  normal  . Hypertension   . PONV (postoperative nausea and vomiting)    req patch   Past Surgical History:  Procedure Laterality Date  . BREAST CYST EXCISION Left 1970  . BREAST SURGERY Left 76   lump   . EYE SURGERY Bilateral 09   cataracts  . HEMORRHOID SURGERY  85  . IR GENERIC HISTORICAL  01/11/2016   IR US GUIDE VASC ACCESS RIGHT 01/11/2016 Jacqulynn Cadet, MD [facility]INTER[facility]  . IR GENERIC HISTORICAL  01/11/2016   IR FLUORO GUIDE CV LINE RIGHT 01/11/2016 Jacqulynn Cadet, MD [facility]  . LUMBAR LAMINECTOMY/DECOMPRESSION MICRODISCECTOMY Right 12/02/2013   Procedure: L5-S1 RIGHT DISCECTOMY;  Surgeon: Melina Schools, MD;  Location: [facility];  Service: Orthopedics;  Laterality: Right;   Social History   Social History  . Marital  status: Married   Social History Main Topics  . Smoking status: Former Smoker    Packs/day: 0.50    Years: 5.00    Types: Cigarettes    Quit date: 12/02/1966  . Smokeless tobacco: Never Used  . Alcohol use No  . Drug use: No   Family History  Problem Relation Age of Onset  . Heart failure Mother   . Depression Mother   . Heart attack Father 49  . Leukemia Cousin      Current Outpatient Prescriptions:  .  ALPRAZolam (XANAX) 0.5 MG tablet, TAKE 1 TABLET BY MOUTH 3 TIMES A DAY AS NEEDED (Patient taking differently: TAKE 1 TABLET BY MOUTH 3 TIMES A DAY AS NEEDED for anxiety.), Disp: 60 tablet, Rfl: 2 .  aspirin 81 MG tablet, Take 81 mg by mouth daily. , Disp: , Rfl:  .  atorvastatin (LIPITOR) 20 MG tablet, Take 10 mg by mouth at bedtime., Disp: , Rfl:  .  Calcium-Magnesium-Vitamin D (CALCIUM 1200+D3 PO), Take 1 tablet by mouth daily., Disp: , Rfl:  .  carvedilol (COREG) 3.125 MG tablet, Take 1 tablet (3.125 mg total) by mouth 2 (two) times daily with a meal., Disp: 60 tablet, Rfl: 0 .  cholecalciferol (VITAMIN D) 1000 UNITS tablet, Take 1,000 Units by mouth daily., Disp: , Rfl:  .  lidocaine-prilocaine (EMLA) cream, Apply 1 application topically as needed. Place quarter sized amount of cream directly on portacath and cover with saran wrap at least 1 - 1 1/2 hours prior to procedure., Disp: 30 g, Rfl: 3 .  lisinopril (PRINIVIL,ZESTRIL) 5 MG tablet, Take 1 tablet (5 mg total) by mouth daily., Disp: 30 tablet, Rfl: 0 .  Multiple Vitamins-Minerals (MULTIVITAMIN ADULT) TABS, Take 1 tablet by mouth daily., Disp: , Rfl:  .  orphenadrine (NORFLEX) 100 MG tablet, Take 1 tablet (100 mg total) by mouth at bedtime as needed for muscle spasms., Disp: 30 tablet, Rfl: 2 .  PARoxetine (PAXIL) 20 MG tablet, Take 20 mg by mouth daily., Disp: , Rfl:  .  potassium chloride SA (K-DUR,KLOR-CON) 20 MEQ tablet, Take 1 pill twice a day for 7 days, then 1 pill a day., Disp: 60 tablet, Rfl: 3 .  prochlorperazine  (COMPAZINE) 5 MG tablet, Take 1 tablet (5 mg total) by mouth every 6 (six) hours as needed for nausea or vomiting., Disp: 60 tablet, Rfl: 0 .  spironolactone (ALDACTONE) 25 MG tablet, Take 0.5 tablets (12.5 mg total) by mouth daily., Disp: 30 tablet, Rfl: 0 .  sulfamethoxazole-trimethoprim (BACTRIM DS) 800-160 MG per tablet, Take 1 tablet by mouth daily., Disp: , Rfl:  .  cephALEXin (KEFLEX) 500 MG capsule, Take 1 capsule (500 mg total) by mouth 3 (three) times daily., Disp: 30 capsule, Rfl: 0  No LMP recorded. Patient is postmenopausal.  ROS Cardiovascular: Denies chest pain  Respiratory: Denies dyspnea   OBJECTIVE: BP 114/62 (BP Location: Left Arm, Patient Position: Sitting, Cuff Size: Small)   Pulse 91   Temp 97.9 F (36.6 C) (Oral)   Ht 5\' 2"  (1.575 m)   Wt 129 lb (58.5 kg)   SpO2 97%   BMI 23.59 kg/m   Constitutional: -  VS reviewed -  Well developed, well nourished, appears stated age -  No apparent distress  Psychiatric: -  Oriented to person, place, and time -  Memory intact -  Affect and mood normal -  Fluent conversation, good eye contact -  Judgment and insight age appropriate  ENMT: -  Ears are patent b/l without erythema or discharge. TM's are shiny and clear b/l without evidence of effusion or infection. -  Oral mucosa without lesions, tongue and uvula midline    Tonsils not enlarged, no erythema, no exudate, trachea midline    Pharynx moist, no lesions, no erythema  Neck: -  No gross swelling, no palpable masses -  Thyroid midline, not enlarged, mobile, no palpable masses  Cardiovascular: -  RRR, 2/6 systolic ejection murmur heard loudest at the aortic listening post -  No LE edema  Respiratory: -  Normal respiratory effort, no accessory muscle use, no retraction -  Breath sounds equal, no wheezes, no ronchi, no crackles  Gastrointestinal: -  Bowel sounds normal -  No tenderness, no distention, no guarding, no masses  Musculoskeletal: -  No clubbing, no  cyanosis -  Decreased wrist flexion on the right compared to the left  -  Grip strength is adequate bilaterally  -  She has no tenderness to palpation over bony landmarks   Skin: -  12.5 cm x 2 wrist and 1.5 cm in forearm there is a darker erythematous lesion, some soft tissue swelling in the wrist, no significant tenderness to palpation, No drainage or blistering.    ASSESSMENT/PLAN: Cellulitis of right upper extremity - Plan: cephALEXin (KEFLEX) 500 MG capsule  Patient instructed to  sign release of records form from her previous PCP. The history and presentation is most concerning for skin infection at this time. Will treat as above. The patient is very concerned that this is related to her leukemia. I said I did not think this was a case but will defer the final say on this Dr. Marin Olp, who she is seeing tomorrow. Appears to be doing better after being discharged from the hospitalization. I will not reimage at this time. Dr. Marin Olp has labs ordered for tomorrow and I do not see anything else that I would add.  Patient should return in 1 week, she can cancel the appointment if she is doing better. If she is not improved, will consider adding MRSA coverage versus biopsying. The patient voiced understanding and agreement to the plan.   [place], DO 05/31/16  12:02 PM

## 2016-05-31 NOTE — Patient Instructions (Signed)
Stay well hydrated.  Keep taking the Lisinopril until you see Dr. Stanford Breed.

## 2016-06-01 ENCOUNTER — Ambulatory Visit (HOSPITAL_BASED_OUTPATIENT_CLINIC_OR_DEPARTMENT_OTHER): Payer: Medicare Other | Admitting: Hematology & Oncology

## 2016-06-01 ENCOUNTER — Other Ambulatory Visit (HOSPITAL_BASED_OUTPATIENT_CLINIC_OR_DEPARTMENT_OTHER): Payer: Medicare Other

## 2016-06-01 ENCOUNTER — Ambulatory Visit (HOSPITAL_BASED_OUTPATIENT_CLINIC_OR_DEPARTMENT_OTHER): Payer: Medicare Other

## 2016-06-01 VITALS — BP 113/89 | HR 78 | Temp 98.1°F | Resp 16 | Wt 129.0 lb

## 2016-06-01 DIAGNOSIS — C9300 Acute monoblastic/monocytic leukemia, not having achieved remission: Secondary | ICD-10-CM

## 2016-06-01 LAB — COMPREHENSIVE METABOLIC PANEL (CC13)
ALT: 17 IU/L (ref 0–32)
AST (SGOT): 21 IU/L (ref 0–40)
Albumin, Serum: 3.8 g/dL (ref 3.5–4.8)
Albumin/Globulin Ratio: 1.7 (ref 1.2–2.2)
Alkaline Phosphatase, S: 93 IU/L (ref 39–117)
BUN/Creatinine Ratio: 28 (ref 12–28)
BUN: 14 mg/dL (ref 8–27)
Bilirubin Total: 0.2 mg/dL (ref 0.0–1.2)
CALCIUM: 9.2 mg/dL (ref 8.7–10.3)
CHLORIDE: 102 mmol/L (ref 96–106)
CO2: 27 mmol/L (ref 18–29)
CREATININE: 0.5 mg/dL — AB (ref 0.57–1.00)
GFR, EST AFRICAN AMERICAN: 108 mL/min/{1.73_m2} (ref >59)
GFR, EST NON AFRICAN AMERICAN: 94 mL/min/{1.73_m2} (ref >59)
GLUCOSE: 101 mg/dL — AB (ref 65–99)
Globulin, Total: 2.3 g/dL (ref 1.5–4.5)
Potassium, Ser: 3.9 mmol/L (ref 3.5–5.2)
Sodium: 138 mmol/L (ref 134–144)
TOTAL PROTEIN: 6.1 g/dL (ref 6.0–8.5)

## 2016-06-01 LAB — CBC WITH DIFFERENTIAL (CANCER CENTER ONLY)
BASO#: 0.1 10*3/uL (ref 0.0–0.2)
BASO%: 0.9 % (ref 0.0–2.0)
EOS%: 8.9 % — AB (ref 0.0–7.0)
Eosinophils Absolute: 0.7 10*3/uL — ABNORMAL HIGH (ref 0.0–0.5)
HEMATOCRIT: 32 % — AB (ref 34.8–46.6)
HEMOGLOBIN: 10.2 g/dL — AB (ref 11.6–15.9)
LYMPH#: 2.8 10*3/uL (ref 0.9–3.3)
LYMPH%: 36.5 % (ref 14.0–48.0)
MCH: 30.9 pg (ref 26.0–34.0)
MCHC: 31.9 g/dL — ABNORMAL LOW (ref 32.0–36.0)
MCV: 97 fL (ref 81–101)
MONO#: 0.8 10*3/uL (ref 0.1–0.9)
MONO%: 11 % (ref 0.0–13.0)
NEUT%: 42.7 % (ref 39.6–80.0)
NEUTROS ABS: 3.3 10*3/uL (ref 1.5–6.5)
Platelets: 164 10*3/uL (ref 145–400)
RBC: 3.3 10*6/uL — AB (ref 3.70–5.32)
RDW: 21.1 % — ABNORMAL HIGH (ref 11.1–15.7)
WBC: 7.6 10*3/uL (ref 3.9–10.0)

## 2016-06-01 LAB — CHCC SATELLITE - SMEAR

## 2016-06-01 MED ORDER — HEPARIN SOD (PORK) LOCK FLUSH 100 UNIT/ML IV SOLN
500.0000 [IU] | Freq: Once | INTRAVENOUS | Status: AC
  Administered 2016-06-01: 500 [IU] via INTRAVENOUS
  Filled 2016-06-01: qty 5

## 2016-06-01 MED ORDER — SODIUM CHLORIDE 0.9% FLUSH
10.0000 mL | INTRAVENOUS | Status: DC | PRN
  Administered 2016-06-01: 10 mL via INTRAVENOUS
  Filled 2016-06-01: qty 10

## 2016-06-01 NOTE — Progress Notes (Signed)
Kristina Meyer presented for Portacath access and flush. Proper placement of portacath confirmed by CXR. Portacath located in the right chest wall accessed with  H 20 needle. Clean, Dry and Intact Good blood return present. Portacath flushed with 65ml NS and 500U/84ml Heparin per protocol and needle removed intact. Procedure without incident. Patient tolerated procedure well.

## 2016-06-01 NOTE — Progress Notes (Signed)
Hematology and Oncology Follow Up Visit  Kristina Meyer [id number] Oct 21, 1940 76 y.o. 06/01/2016   Principle Diagnosis:   Acute monocytic leukemia - normal cytogenetics - Remission  Current Therapy:    Status post cycle #4 of Dacogen-7 day cycle     Interim History:  Kristina Meyer is  back for follow-up. She really has had an incredibly tough time since we last saw her. She was hospitalized at [facility] for about a month. She was profoundly neutropenic and had pneumonia. She was intubated. Thankfully, she pulled through. Once her white cell count came back up, she was able to fight off the pneumonia.  While she was in the hospital, we did do a bone marrow biopsy on her. This is done on December 12. The pathology report ([id number]) showed a hypercellular marrow with no evidence of leukemia. Her flow cytometry was normal. Her cytogenetics were normal.  I'm not sure as to why she had such profound neutropenia. I know she was also transfused with blood and platelets. She had never had problems with the Dacogen.  She is looking so much better now. She is at home. She was discharged about 3 weeks ago.  She is in better. She lost about 10 pounds during the hospitalization.  She's had no fever. She's had no bleeding. She's had a little bit of a cough. Per she had bad cellulitis on the dorsum of her right hand. There is still some hyperpigmentation in this area. She was recently given some antibiotics by her primary care doctor. I do not think that she needs any antibiotics as I think this area will always be slightly discolored.  She has had no leg swelling. She has had some change in her medications. We need to make sure that she is not on too many medicines.  Overall, her performance status has to be ECOG 1.  Medications:  Current Outpatient Prescriptions:  .  ALPRAZolam (XANAX) 0.5 MG tablet, TAKE 1 TABLET BY MOUTH 3 TIMES A DAY AS NEEDED (Patient taking differently: TAKE 1 TABLET BY  MOUTH 3 TIMES A DAY AS NEEDED for anxiety.), Disp: 60 tablet, Rfl: 2 .  aspirin 81 MG tablet, Take 81 mg by mouth daily. , Disp: , Rfl:  .  atorvastatin (LIPITOR) 20 MG tablet, Take 10 mg by mouth at bedtime., Disp: , Rfl:  .  Calcium-Magnesium-Vitamin D (CALCIUM 1200+D3 PO), Take 1 tablet by mouth daily., Disp: , Rfl:  .  carvedilol (COREG) 3.125 MG tablet, Take 1 tablet (3.125 mg total) by mouth 2 (two) times daily with a meal., Disp: 60 tablet, Rfl: 0 .  cephALEXin (KEFLEX) 500 MG capsule, Take 1 capsule (500 mg total) by mouth 3 (three) times daily., Disp: 30 capsule, Rfl: 0 .  cholecalciferol (VITAMIN D) 1000 UNITS tablet, Take 1,000 Units by mouth daily., Disp: , Rfl:  .  lidocaine-prilocaine (EMLA) cream, Apply 1 application topically as needed. Place quarter sized amount of cream directly on portacath and cover with saran wrap at least 1 - 1 1/2 hours prior to procedure., Disp: 30 g, Rfl: 3 .  lisinopril (PRINIVIL,ZESTRIL) 5 MG tablet, Take 1 tablet (5 mg total) by mouth daily., Disp: 30 tablet, Rfl: 0 .  Multiple Vitamins-Minerals (MULTIVITAMIN ADULT) TABS, Take 1 tablet by mouth daily., Disp: , Rfl:  .  orphenadrine (NORFLEX) 100 MG tablet, Take 1 tablet (100 mg total) by mouth at bedtime as needed for muscle spasms., Disp: 30 tablet, Rfl: 2 .  PARoxetine (PAXIL) 20 MG  tablet, Take 20 mg by mouth daily., Disp: , Rfl:  .  potassium chloride SA (K-DUR,KLOR-CON) 20 MEQ tablet, Take 1 pill twice a day for 7 days, then 1 pill a day., Disp: 60 tablet, Rfl: 3 .  prochlorperazine (COMPAZINE) 5 MG tablet, Take 1 tablet (5 mg total) by mouth every 6 (six) hours as needed for nausea or vomiting., Disp: 60 tablet, Rfl: 0 .  spironolactone (ALDACTONE) 25 MG tablet, Take 0.5 tablets (12.5 mg total) by mouth daily., Disp: 30 tablet, Rfl: 0 .  sulfamethoxazole-trimethoprim (BACTRIM DS) 800-160 MG per tablet, Take 1 tablet by mouth daily., Disp: , Rfl:  No current facility-administered medications for this  visit.   Facility-Administered Medications Ordered in Other Visits:  .  sodium chloride flush (NS) 0.9 % injection 10 mL, 10 mL, Intravenous, PRN, Volanda Napoleon, MD, 10 mL at 06/01/16 1527  Allergies:  Allergies  Allergen Reactions  . Other Other (See Comments)    GENERAL Anesthesia, vomiting    Past Medical History, Surgical history, Social history, and Family History were reviewed and updated.  Review of Systems:  As above  Physical Exam:  weight is 129 lb (58.5 kg). Her oral temperature is 98.1 F (36.7 C). Her blood pressure is 113/89 and her pulse is 78. Her respiration is 16 and oxygen saturation is 100%.   Wt Readings from Last 3 Encounters:  06/01/16 129 lb (58.5 kg)  05/31/16 129 lb (58.5 kg)  05/16/16 121 lb 14.6 oz (55.3 kg)      Well-developed and well-nourished white female in no obvious distress. Head and neck exam shows no ocular or oral lesions. There are no palpable cervical or supra clavicular lymph nodes. Lungs are clear bilaterally. Cardiac exam regular rate and rhythm with no murmurs, rubs or bruits. Abdomen is soft. She has good bowel sounds. There is no fluid wave. There is no palpable liver or spleen tip. Back exam shows no tenderness over the spine, ribs or hips. Externa shows no clubbing, cyanosis or edema. She has good range of motion of her joints. Neurological exam shows no focal neurological deficits.  Lab Results  Component Value Date   WBC 7.6 06/01/2016   HGB 10.2 (L) 06/01/2016   HCT 32.0 (L) 06/01/2016   MCV 97 06/01/2016   PLT 164 06/01/2016     Chemistry      Component Value Date/Time   NA 140 05/16/2016 0557   NA 140 04/16/2016 1115   NA 139 02/08/2016 1009   K 3.8 05/16/2016 0557   K 3.5 04/16/2016 1115   K 4.1 02/08/2016 1009   CL 107 05/16/2016 0557   CL 102 04/16/2016 1115   CO2 26 05/16/2016 0557   CO2 28 04/16/2016 1115   CO2 24 02/08/2016 1009   BUN 12 05/16/2016 0557   BUN 12 04/16/2016 1115   BUN 11.3 02/08/2016  1009   CREATININE 0.51 05/16/2016 0557   CREATININE 0.8 04/16/2016 1115   CREATININE 0.8 02/08/2016 1009      Component Value Date/Time   CALCIUM 8.6 (L) 05/16/2016 0557   CALCIUM 9.3 04/16/2016 1115   CALCIUM 9.2 02/08/2016 1009   ALKPHOS 116 05/11/2016 0343   ALKPHOS 88 (H) 04/16/2016 1115   ALKPHOS 67 02/08/2016 1009   AST 26 05/11/2016 0343   AST 34 04/16/2016 1115   AST 34 02/08/2016 1009   ALT 20 05/11/2016 0343   ALT 25 04/16/2016 1115   ALT 22 02/08/2016 1009   BILITOT 1.2 05/11/2016  0343   BILITOT 1.00 04/16/2016 1115   BILITOT 0.60 02/08/2016 1009         Impression and Plan: Ms. 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 Her cytogenetics are normal which I would think would be a good prognostic sign.  Again, I'm incredibly impressed that she responded so well. I'm also discouraged by the fact that she has such a hard time with the last cycle of treatment.  At this point, we really are not able to treat her again. I would like to treat her with a shorter course of Dacogen. However, I'm just afraid that she just might regress.  We will have to follow her very closely.  I will plan to get her back to see [place] in 3 weeks. I want to make sure that we maintain close follow-up with her.  I spent about 30 minutes with she and her husband. She had an incredibly, Kit hospital course. I was reviewing this with her. I was reviewing her bone marrow results with her. I'm glad that she is starting off the new year with such good results.   will plan to get her back in 2 weeks for follow-up.    Volanda Napoleon, MD 1/5/20185:58 PM

## 2016-06-04 LAB — LACTATE DEHYDROGENASE: LDH: 202 U/L (ref 125–245)

## 2016-06-07 ENCOUNTER — Ambulatory Visit: Payer: Medicare Other | Admitting: Family Medicine

## 2016-06-07 ENCOUNTER — Other Ambulatory Visit: Payer: Medicare Other

## 2016-06-07 ENCOUNTER — Ambulatory Visit: Payer: Medicare Other | Admitting: Hematology & Oncology

## 2016-06-08 ENCOUNTER — Telehealth: Payer: Self-pay | Admitting: *Deleted

## 2016-06-08 LAB — FUNGUS CULTURE WITH STAIN

## 2016-06-08 LAB — FUNGAL ORGANISM REFLEX

## 2016-06-08 LAB — FUNGUS CULTURE RESULT

## 2016-06-08 MED ORDER — MAGIC MOUTHWASH W/LIDOCAINE: 5.0000 mL | Freq: Four times a day (QID) | ORAL | 6 refills | Status: AC | PRN

## 2016-06-08 NOTE — Telephone Encounter (Signed)
Patient is having tongue sensitivity from when she was intubated recently. There are no open sores, the tongue is just sensitive to temperatures and acidic foods like orange juice. She wants to know if Dr Marin Olp could suggest anything.  Spoke with Dr Marin Olp and he wants patient to try Laplace.   Patient's husband aware of new prescription and directions for use. Pharmacy confirmed.

## 2016-06-11 ENCOUNTER — Telehealth: Payer: Self-pay | Admitting: *Deleted

## 2016-06-11 NOTE — Progress Notes (Signed)
HPI: 76 yo female for evaluation of cardiomyopathy. Pt with AML receiving decitabine. Chest CT November 2017 showed moderate to large left pleural effusion. Admitted with presumed pneumonia. Hgb 6.7. Treated with antibiotics and thoracentesis but ultimately required intubation. Had chest tube placed. Echocardiogram December 2017 showed ejection fraction [id number], grade 2 diastolic dysfunction, mild left ventricular hypertrophy and wall motion abnormalities. Mild to moderate mitral regurgitation and moderate left atrial enlargement. Pt ultimately improved and DCed; asked to fu with cardiology. Patient has markedly improved since discharge. She denies dyspnea on exertion, orthopnea, PND, pedal edema, chest pain, palpitations or syncope.   Current Outpatient Prescriptions  Medication Sig Dispense Refill  . ALPRAZolam (XANAX) 0.5 MG tablet TAKE 1 TABLET BY MOUTH 3 TIMES A DAY AS NEEDED (Patient taking differently: TAKE 1 TABLET BY MOUTH 3 TIMES A DAY AS NEEDED for anxiety.) 60 tablet 2  . aspirin 81 MG tablet Take 81 mg by mouth daily.     Marland Kitchen atorvastatin (LIPITOR) 20 MG tablet Take 10 mg by mouth at bedtime.    . Calcium-Magnesium-Vitamin D (CALCIUM 1200+D3 PO) Take 1 tablet by mouth daily.    . cholecalciferol (VITAMIN D) 1000 UNITS tablet Take 1,000 Units by mouth daily.    Marland Kitchen lidocaine-prilocaine (EMLA) cream Apply 1 application topically as needed. Place quarter sized amount of cream directly on portacath and cover with saran wrap at least 1 - 1 1/2 hours prior to procedure. 30 g 3  . magic mouthwash w/lidocaine SOLN Take 5 mLs by mouth 4 (four) times daily as needed for mouth pain. 500 mL 6  . Multiple Vitamins-Minerals (MULTIVITAMIN ADULT) TABS Take 1 tablet by mouth daily.    . orphenadrine (NORFLEX) 100 MG tablet Take 1 tablet (100 mg total) by mouth at bedtime as needed for muscle spasms. 30 tablet 2  . PARoxetine (PAXIL) 20 MG tablet Take 20 mg by mouth daily.    . potassium chloride SA  (K-DUR,KLOR-CON) 20 MEQ tablet Take 1 pill twice a day for 7 days, then 1 pill a day. 60 tablet 3  . spironolactone (ALDACTONE) 25 MG tablet Take 0.5 tablets (12.5 mg total) by mouth daily. 30 tablet 0  . sulfamethoxazole-trimethoprim (BACTRIM DS) 800-160 MG per tablet Take 1 tablet by mouth daily.     No current facility-administered medications for this visit.     Allergies  Allergen Reactions  . Other Other (See Comments)    GENERAL Anesthesia, vomiting     Past Medical History:  Diagnosis Date  . Acute systolic CHF (congestive heart failure) ([place])    a. 04/2016: EF 35%, Grade 2 DD, Severe HK, mild to moderate MR  . AML M5 (acute monocytic leukemia) ([place]) 01/05/2016  . Anxiety   . Heart murmur    echo done 20 yrs ago  normal  . Hyperlipidemia   . Hypertension   . MVP (mitral valve prolapse)   . PONV (postoperative nausea and vomiting)    req patch    Past Surgical History:  Procedure Laterality Date  . BREAST CYST EXCISION Left 1970  . BREAST SURGERY Left 76   lump   . EYE SURGERY Bilateral 09   cataracts  . HEMORRHOID SURGERY  85  . IR GENERIC HISTORICAL  01/11/2016   IR US GUIDE VASC ACCESS RIGHT 01/11/2016 Jacqulynn Cadet, MD [facility]  . IR GENERIC HISTORICAL  01/11/2016   IR FLUORO GUIDE CV LINE RIGHT 01/11/2016 Jacqulynn Cadet, MD [facility]  . LUMBAR LAMINECTOMY/DECOMPRESSION MICRODISCECTOMY  Right 12/02/2013   Procedure: L5-S1 RIGHT DISCECTOMY;  Surgeon: Melina Schools, MD;  Location: [facility];  Service: Orthopedics;  Laterality: Right;    Social History   Social History  . Marital status: Married    Spouse name: N/A  . Number of children: 1  . Years of education: N/A   Occupational History  . Not on file.   Social History Main Topics  . Smoking status: Former Smoker    Packs/day: 0.50    Years: 5.00    Types: Cigarettes    Quit date: 12/02/1966  . Smokeless tobacco: Never Used  . Alcohol use No  . Drug use: No  . Sexual activity: Not on file    Other Topics Concern  . Not on file   Social History Narrative  . No narrative on file    Family History  Problem Relation Age of Onset  . Heart failure Mother   . Depression Mother   . Heart attack Father 41  . Leukemia Cousin     ROS: Mild residual weakness but no fevers or chills, productive cough, hemoptysis, dysphasia, odynophagia, melena, hematochezia, dysuria, hematuria, rash, seizure activity, orthopnea, PND, pedal edema, claudication. Remaining systems are negative.  Physical Exam:   Blood pressure 120/71, pulse 72, height 5\' 2"  (1.575 m), weight 128 lb 12.8 oz (58.4 kg).  General:  Well developed/thin in NAD Skin warm/dry Patient not depressed No peripheral clubbing Back-normal HEENT-normal/normal eyelids Neck supple/normal carotid upstroke bilaterally; no JVD; no thyromegaly chest - CTA/ normal expansion CV - RRR/normal S1 and S2; no murmurs, rubs or gallops;  PMI nondisplaced Abdomen -NT/ND, no HSM, no mass, + bowel sounds, no bruit 2+ femoral pulses, no bruits Ext-no edema, chords, 2+ DP Neuro-grossly nonfocal  ECG 05/12/2016-sinus rhythm, left bundle branch block.  A/P  1 cardiomyopathy-etiology unclear. 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 I will resume her carvedilol at 3.125 mg twice a day. She had a cough with lisinopril. Add Cozaar 25 mg daily. She will have her potassium and renal function checked on January 26 and I will check a TSH at that time as well. We will plan to repeat her echocardiogram in 8 weeks. If LV function remains decreased we will need to pursue an ischemia evaluation likely cardiac catheterization. If it improves we will plan medical therapy. 20 min spent reviewing records prior to pt arrival.  2 left bundle branch block-noted on ECG. If LV function does not improve she will need ischemia evaluation.  3 leukemia-managed by oncology.  4 Hypertension-blood pressure controlled.   Kirk Ruths,  MD

## 2016-06-11 NOTE — Telephone Encounter (Signed)
Patient notifying office of a small rash to the right side of her neck. She describes it as "Four diamond shaped spots". They are neither painful nor itchy. Her next appointment is 06/22/16  Spoke with Dr Marin Olp. He doesn't feel the rash needs to be assessed at this time. He wants to patient to monitor and if they rash spreads, the skin become open or she develops itching/pain she needs to call the office back. Patient understands this. Confirmed with teachback.

## 2016-06-12 LAB — ACID FAST CULTURE WITH REFLEXED SENSITIVITIES
ACID FAST CULTURE - AFSCU3: NEGATIVE
ACID FAST CULTURE - AFSCU3: NEGATIVE

## 2016-06-14 LAB — ACID FAST CULTURE WITH REFLEXED SENSITIVITIES (MYCOBACTERIA): Acid Fast Culture: NEGATIVE

## 2016-06-14 LAB — ACID FAST CULTURE WITH REFLEXED SENSITIVITIES

## 2016-06-20 ENCOUNTER — Ambulatory Visit (INDEPENDENT_AMBULATORY_CARE_PROVIDER_SITE_OTHER): Payer: Medicare Other | Admitting: Cardiology

## 2016-06-20 ENCOUNTER — Encounter: Payer: Self-pay | Admitting: Cardiology

## 2016-06-20 VITALS — BP 120/71 | HR 72 | Ht 62.0 in | Wt 128.8 lb

## 2016-06-20 DIAGNOSIS — I447 Left bundle-branch block, unspecified: Secondary | ICD-10-CM | POA: Diagnosis not present

## 2016-06-20 DIAGNOSIS — I420 Dilated cardiomyopathy: Secondary | ICD-10-CM

## 2016-06-20 DIAGNOSIS — I10 Essential (primary) hypertension: Secondary | ICD-10-CM

## 2016-06-20 DIAGNOSIS — I509 Heart failure, unspecified: Secondary | ICD-10-CM

## 2016-06-20 MED ORDER — CARVEDILOL 3.125 MG PO TABS: 3.1250 mg | ORAL_TABLET | Freq: Two times a day (BID) | ORAL | 3 refills | Status: DC

## 2016-06-20 MED ORDER — LOSARTAN POTASSIUM 25 MG PO TABS: 25.0000 mg | ORAL_TABLET | Freq: Every day | ORAL | 3 refills | Status: DC

## 2016-06-20 NOTE — Patient Instructions (Addendum)
Medication Instructions:   RESTART CARVEDILOL 3/125 MG ONE TABLET TWICE DAILY  START LOSARTAN 25 MG ONCE DAILY  LABWORK:  Your physician recommends that you return for lab work WITH DR Jonette Eva  Testing/Procedures:  Your physician has requested that you have an echocardiogram. Echocardiography is a painless test that uses sound waves to create images of your heart. It provides your doctor with information about the size and shape of your heart and how well your heart's chambers and valves are working. This procedure takes approximately one hour. There are no restrictions for this procedure.    Follow-Up:  Your physician recommends that you schedule a follow-up appointment AFTER ECHO

## 2016-06-22 ENCOUNTER — Other Ambulatory Visit (HOSPITAL_BASED_OUTPATIENT_CLINIC_OR_DEPARTMENT_OTHER): Payer: Medicare Other

## 2016-06-22 ENCOUNTER — Ambulatory Visit (HOSPITAL_BASED_OUTPATIENT_CLINIC_OR_DEPARTMENT_OTHER): Payer: Medicare Other | Admitting: Hematology & Oncology

## 2016-06-22 ENCOUNTER — Ambulatory Visit (HOSPITAL_BASED_OUTPATIENT_CLINIC_OR_DEPARTMENT_OTHER): Payer: Medicare Other

## 2016-06-22 VITALS — Wt 127.8 lb

## 2016-06-22 VITALS — BP 104/49 | HR 72 | Temp 98.1°F | Resp 17

## 2016-06-22 DIAGNOSIS — C9301 Acute monoblastic/monocytic leukemia, in remission: Secondary | ICD-10-CM | POA: Diagnosis not present

## 2016-06-22 DIAGNOSIS — C9300 Acute monoblastic/monocytic leukemia, not having achieved remission: Secondary | ICD-10-CM

## 2016-06-22 DIAGNOSIS — R634 Abnormal weight loss: Secondary | ICD-10-CM

## 2016-06-22 DIAGNOSIS — Z95828 Presence of other vascular implants and grafts: Secondary | ICD-10-CM

## 2016-06-22 LAB — CBC WITH DIFFERENTIAL (CANCER CENTER ONLY)
BASO#: 0 10*3/uL (ref 0.0–0.2)
BASO%: 0.6 % (ref 0.0–2.0)
EOS%: 11.8 % — AB (ref 0.0–7.0)
Eosinophils Absolute: 0.8 10*3/uL — ABNORMAL HIGH (ref 0.0–0.5)
HCT: 34.5 % — ABNORMAL LOW (ref 34.8–46.6)
HEMOGLOBIN: 11.2 g/dL — AB (ref 11.6–15.9)
LYMPH#: 2.2 10*3/uL (ref 0.9–3.3)
LYMPH%: 32.5 % (ref 14.0–48.0)
MCH: 32.3 pg (ref 26.0–34.0)
MCHC: 32.5 g/dL (ref 32.0–36.0)
MCV: 99 fL (ref 81–101)
MONO#: 0.4 10*3/uL (ref 0.1–0.9)
MONO%: 6 % (ref 0.0–13.0)
NEUT%: 49.1 % (ref 39.6–80.0)
NEUTROS ABS: 3.3 10*3/uL (ref 1.5–6.5)
Platelets: 147 10*3/uL (ref 145–400)
RBC: 3.47 10*6/uL — ABNORMAL LOW (ref 3.70–5.32)
RDW: 18.1 % — AB (ref 11.1–15.7)
WBC: 6.7 10*3/uL (ref 3.9–10.0)

## 2016-06-22 LAB — CMP (CANCER CENTER ONLY)
ALT: 22 U/L (ref 10–47)
AST: 29 U/L (ref 11–38)
Albumin: 3.9 g/dL (ref 3.3–5.5)
Alkaline Phosphatase: 66 U/L (ref 26–84)
BILIRUBIN TOTAL: 0.7 mg/dL (ref 0.20–1.60)
BUN, Bld: 16 mg/dL (ref 7–22)
CO2: 28 meq/L (ref 18–33)
CREATININE: 0.7 mg/dL (ref 0.6–1.2)
Calcium: 9.7 mg/dL (ref 8.0–10.3)
Chloride: 104 mEq/L (ref 98–108)
GLUCOSE: 128 mg/dL — AB (ref 73–118)
Potassium: 4.5 mEq/L (ref 3.3–4.7)
Sodium: 140 mEq/L (ref 128–145)
Total Protein: 6.6 g/dL (ref 6.4–8.1)

## 2016-06-22 LAB — LACTATE DEHYDROGENASE: LDH: 202 U/L (ref 125–245)

## 2016-06-22 LAB — CHCC SATELLITE - SMEAR

## 2016-06-22 MED ORDER — SODIUM CHLORIDE 0.9% FLUSH
10.0000 mL | INTRAVENOUS | Status: AC | PRN
  Administered 2016-06-22: 10 mL via INTRAVENOUS
  Filled 2016-06-22: qty 10

## 2016-06-22 MED ORDER — HEPARIN SOD (PORK) LOCK FLUSH 100 UNIT/ML IV SOLN
500.0000 [IU] | Freq: Once | INTRAVENOUS | Status: AC
  Administered 2016-06-22: 500 [IU] via INTRAVENOUS
  Filled 2016-06-22: qty 5

## 2016-06-22 NOTE — Addendum Note (Signed)
Addended by: Smiley Houseman F on: 06/22/2016 02:03 PM   Modules accepted: Orders, SmartSet

## 2016-06-22 NOTE — Progress Notes (Signed)
Hematology and Oncology Follow Up Visit  Maralynn Peaches [id number] 06/01/1940 76 y.o. 06/22/2016   Principle Diagnosis:   Acute monocytic leukemia - normal cytogenetics - Remission  Current Therapy:    Status post cycle #4 of Dacogen-7 day cycle - completed   04/17/2016     Interim History:  Ms. Hutchcraft is  back for follow-up. She is looking like her old self. She really looks fantastic. She had an incredibly hard time back in November and December. She was hospitalized in the ICU with severe pneumonia and prolonged pan cytopenia. Thankfully, she got through all this.   She had a very nice Christmas and New Year's. She is eating well. She's having no nausea or vomiting. She's having no cough. She's having no leg swelling. Having no rashes.   Her hair looks wonderful.   She is quite sad though that our nurse, Kandice Moos, has left. She really liked her.   She saw her cardiologist, Dr. Stanford Breed, and he ordered some tests. I think that he feels her cardiac status is improving. He felt that she had some cardiomyopathy, possibly from just being so sick. She is now back on Corag. In addition, Cozaar was added. He will follow-up in 8 weeks with an echocardiogram. He is not ruled out a cardiac cath if her cardiac function does not improve.  Overall, her performance status has to be ECOG 1.  Medications:  Current Outpatient Prescriptions:  .  ALPRAZolam (XANAX) 0.5 MG tablet, TAKE 1 TABLET BY MOUTH 3 TIMES A DAY AS NEEDED (Patient taking differently: TAKE 1 TABLET BY MOUTH 3 TIMES A DAY AS NEEDED for anxiety.), Disp: 60 tablet, Rfl: 2 .  aspirin 81 MG tablet, Take 81 mg by mouth daily. , Disp: , Rfl:  .  atorvastatin (LIPITOR) 20 MG tablet, Take 10 mg by mouth at bedtime., Disp: , Rfl:  .  Calcium-Magnesium-Vitamin D (CALCIUM 1200+D3 PO), Take 1 tablet by mouth daily., Disp: , Rfl:  .  carvedilol (COREG) 3.125 MG tablet, Take 1 tablet (3.125 mg total) by mouth 2 (two) times daily., Disp: 180 tablet, Rfl:  3 .  cholecalciferol (VITAMIN D) 1000 UNITS tablet, Take 1,000 Units by mouth daily., Disp: , Rfl:  .  lidocaine-prilocaine (EMLA) cream, Apply 1 application topically as needed. Place quarter sized amount of cream directly on portacath and cover with saran wrap at least 1 - 1 1/2 hours prior to procedure., Disp: 30 g, Rfl: 3 .  losartan (COZAAR) 25 MG tablet, Take 1 tablet (25 mg total) by mouth daily., Disp: 90 tablet, Rfl: 3 .  magic mouthwash w/lidocaine SOLN, Take 5 mLs by mouth 4 (four) times daily as needed for mouth pain., Disp: 500 mL, Rfl: 6 .  Multiple Vitamins-Minerals (MULTIVITAMIN ADULT) TABS, Take 1 tablet by mouth daily., Disp: , Rfl:  .  orphenadrine (NORFLEX) 100 MG tablet, Take 1 tablet (100 mg total) by mouth at bedtime as needed for muscle spasms., Disp: 30 tablet, Rfl: 2 .  PARoxetine (PAXIL) 20 MG tablet, Take 20 mg by mouth daily., Disp: , Rfl:  .  potassium chloride SA (K-DUR,KLOR-CON) 20 MEQ tablet, Take 1 pill twice a day for 7 days, then 1 pill a day., Disp: 60 tablet, Rfl: 3 .  spironolactone (ALDACTONE) 25 MG tablet, Take 0.5 tablets (12.5 mg total) by mouth daily., Disp: 30 tablet, Rfl: 0 .  sulfamethoxazole-trimethoprim (BACTRIM DS) 800-160 MG per tablet, Take 1 tablet by mouth daily., Disp: , Rfl:  No current facility-administered medications for  this visit.   Facility-Administered Medications Ordered in Other Visits:  .  sodium chloride flush (NS) 0.9 % injection 10 mL, 10 mL, Intravenous, PRN, Eliezer Bottom, NP, 10 mL at 06/22/16 1403  Allergies:  Allergies  Allergen Reactions  . Other Other (See Comments)    GENERAL Anesthesia, vomiting    Past Medical History, Surgical history, Social history, and Family History were reviewed and updated.  Review of Systems:  As above  Physical Exam:  weight is 127 lb 12.8 oz (58 kg).   Wt Readings from Last 3 Encounters:  06/22/16 127 lb 12.8 oz (58 kg)  06/20/16 128 lb 12.8 oz (58.4 kg)  06/01/16 129 lb  (58.5 kg)      Well-developed and well-nourished white female in no obvious distress. Head and neck exam shows no ocular or oral lesions. There are no palpable cervical or supra clavicular lymph nodes. Lungs are clear bilaterally. Cardiac exam regular rate and rhythm with no murmurs, rubs or bruits. Abdomen is soft. She has good bowel sounds. There is no fluid wave. There is no palpable liver or spleen tip. Back exam shows no tenderness over the spine, ribs or hips. Externa shows no clubbing, cyanosis or edema. She has good range of motion of her joints. Neurological exam shows no focal neurological deficits.  Lab Results  Component Value Date   WBC 6.7 06/22/2016   HGB 11.2 (L) 06/22/2016   HCT 34.5 (L) 06/22/2016   MCV 99 06/22/2016   PLT 147 06/22/2016     Chemistry      Component Value Date/Time   NA 140 06/22/2016 1301   NA 139 02/08/2016 1009   K 4.5 06/22/2016 1301   K 4.1 02/08/2016 1009   CL 104 06/22/2016 1301   CO2 28 06/22/2016 1301   CO2 24 02/08/2016 1009   BUN 16 06/22/2016 1301   BUN 11.3 02/08/2016 1009   CREATININE 0.7 06/22/2016 1301   CREATININE 0.8 02/08/2016 1009      Component Value Date/Time   CALCIUM 9.7 06/22/2016 1301   CALCIUM 9.2 02/08/2016 1009   ALKPHOS 66 06/22/2016 1301   ALKPHOS 67 02/08/2016 1009   AST 29 06/22/2016 1301   AST 34 02/08/2016 1009   ALT 22 06/22/2016 1301   ALT 22 02/08/2016 1009   BILITOT 0.70 06/22/2016 1301   BILITOT 0.60 02/08/2016 1009         Impression and Plan: Ms. Degidio is a 76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 Her cytogenetics are normal which I would think would be a good prognostic sign.  Again, I'm incredibly impressed that she responded so well. I'm also discouraged by the fact that she has such a hard time with the last cycle of treatment.  We will plan to get her back in one month. I think that she is going to do well. I'll look at her blood under the microscope. I do not see any indication  of relapsed leukemia. I suppose this could happen.  I am just glad that her quality of life is getting back to where it used to be.Marland Kitchen    Volanda Napoleon, MD 1/26/20182:27 PM

## 2016-06-23 LAB — T3 UPTAKE
FREE THYROXINE INDEX: 1.3 (ref 1.2–4.9)
T3 Uptake Ratio: 22 % — ABNORMAL LOW (ref 24–39)

## 2016-06-23 LAB — T4: T4 TOTAL: 5.8 ug/dL (ref 4.5–12.0)

## 2016-06-23 LAB — RETICULOCYTES: Reticulocyte Count: 1.8 % (ref 0.6–2.6)

## 2016-06-23 LAB — T4, FREE: FREE T4: 1.01 ng/dL (ref 0.82–1.77)

## 2016-06-25 LAB — TSH: TSH: 4.312 m(IU)/L — ABNORMAL HIGH (ref 0.308–3.960)

## 2016-07-06 ENCOUNTER — Telehealth: Payer: Self-pay | Admitting: Family Medicine

## 2016-07-06 NOTE — Telephone Encounter (Signed)
Patient called stating that Dr. Stanford Breed thought she needed to have Dr. Nani Ravens check her thyroid. Patient is scheduled for 07/11/16 [facility]

## 2016-07-11 ENCOUNTER — Encounter: Payer: Self-pay | Admitting: Family Medicine

## 2016-07-11 ENCOUNTER — Ambulatory Visit (INDEPENDENT_AMBULATORY_CARE_PROVIDER_SITE_OTHER): Payer: Medicare Other | Admitting: Family Medicine

## 2016-07-11 VITALS — BP 108/50 | HR 71 | Temp 98.1°F | Ht 62.0 in | Wt 131.2 lb

## 2016-07-11 DIAGNOSIS — J0100 Acute maxillary sinusitis, unspecified: Secondary | ICD-10-CM | POA: Diagnosis not present

## 2016-07-11 MED ORDER — AMOXICILLIN-POT CLAVULANATE 875-125 MG PO TABS: 1.0000 | ORAL_TABLET | Freq: Two times a day (BID) | ORAL | 0 refills | Status: DC

## 2016-07-11 NOTE — Patient Instructions (Addendum)
You do not need any thyroid medication.  Continue to push fluids, practice good hand hygiene, and cover your mouth if you cough.  If you start having fevers, shaking or shortness of breath, seek immediate care.

## 2016-07-11 NOTE — Progress Notes (Signed)
Chief Complaint  Patient presents with  . Discuss recent thyroid test  . Sinus Problem    stuff nose,blewing nose notice blood,tightness under eyes-x 2 weeks    Kristina Meyer is 76 y.o. female here for possible sinus infection.  Duration: 2 weeks Symptoms include: cough, congestion, rhinorrhea, ST Denies: fevers, SOB, muscle aches, shaking, chest pain, ear pain/drainage Treatment to date: Chlorseptic mouth wash Sick contacts? No  ROS:  Const: Denies fevers HEENT: As noted in HPI  Past Medical History:  Diagnosis Date  . Acute systolic CHF (congestive heart failure) ([place])    a. 04/2016: EF 35%, Grade 2 DD, Severe HK, mild to moderate MR  . AML M5 (acute monocytic leukemia) ([place]) 01/05/2016  . Anxiety   . Heart murmur    echo done 20 yrs ago  normal  . Hyperlipidemia   . Hypertension   . MVP (mitral valve prolapse)   . PONV (postoperative nausea and vomiting)    req patch   Social History   Social History  . Marital status: Married   Social History Main Topics  . Smoking status: Former Smoker    Packs/day: 0.50    Years: 5.00    Types: Cigarettes    Quit date: 12/02/1966  . Smokeless tobacco: Never Used  . Alcohol use No  . Drug use: No   Family History  Problem Relation Age of Onset  . Heart failure Mother   . Depression Mother   . Heart attack Father 40  . Leukemia Cousin     BP (!) 108/50 (BP Location: Left Arm, Patient Position: Sitting, Cuff Size: Normal)   Pulse 71   Temp 98.1 F (36.7 C) (Oral)   Ht 5\' 2"  (1.575 m)   Wt 131 lb 3.2 oz (59.5 kg)   SpO2 98%   BMI 24.00 kg/m  Gen- Awake, alert, appears stated age HEENT- Ears patent, TM's neg b/l, nares are patent, sinuses are not TTP, max sinuses mildly TTP b/l, MMM, pharynx is not erythematous or with exudate Heart- RRR, no murmur, no bruits Lungs- CTAB, no accessory muscle use Psych- Age appropriate judgment and insight, nml mood and affect  Acute non-recurrent maxillary sinusitis - Plan:  amoxicillin-clavulanate (AUGMENTIN) 875-125 MG tablet  Orders as above. Given duration, will treat.  OTC remedies such as PO antihistamines and INCS spray recommended and listed in AVS. Continue to practice good hand hygiene, push fluids, and cover mouth when coughing. She does not need to worry about thyroid levels or starting medication at this time. F/u prn. The patient voiced understanding and agreement to the plan.  [place], DO 07/11/16 10:58 AM

## 2016-07-11 NOTE — Progress Notes (Signed)
Pre visit review using our clinic review tool, if applicable. No additional management support is needed unless otherwise documented below in the visit note. 

## 2016-07-19 ENCOUNTER — Telehealth (HOSPITAL_COMMUNITY): Payer: Self-pay | Admitting: Cardiology

## 2016-07-20 ENCOUNTER — Telehealth: Payer: Self-pay | Admitting: Family Medicine

## 2016-07-20 NOTE — Telephone Encounter (Signed)
Imms Zostavax- 2011 PCV13- 2015 PCV23- 2005 Td- 09/12/06  Labs 11/2015 GFR 59 Tot cholesterol 118 TG's 173 HDL 35 LDL 48 A1c 6.4

## 2016-07-27 ENCOUNTER — Ambulatory Visit (HOSPITAL_BASED_OUTPATIENT_CLINIC_OR_DEPARTMENT_OTHER): Payer: Medicare Other | Admitting: Hematology & Oncology

## 2016-07-27 ENCOUNTER — Encounter (HOSPITAL_COMMUNITY): Payer: Self-pay | Admitting: Cardiology

## 2016-07-27 ENCOUNTER — Ambulatory Visit (HOSPITAL_BASED_OUTPATIENT_CLINIC_OR_DEPARTMENT_OTHER): Payer: Medicare Other

## 2016-07-27 ENCOUNTER — Other Ambulatory Visit (HOSPITAL_BASED_OUTPATIENT_CLINIC_OR_DEPARTMENT_OTHER): Payer: Medicare Other

## 2016-07-27 VITALS — BP 113/54 | HR 69 | Temp 99.1°F | Resp 17

## 2016-07-27 DIAGNOSIS — C9300 Acute monoblastic/monocytic leukemia, not having achieved remission: Secondary | ICD-10-CM

## 2016-07-27 DIAGNOSIS — C9301 Acute monoblastic/monocytic leukemia, in remission: Secondary | ICD-10-CM

## 2016-07-27 DIAGNOSIS — R634 Abnormal weight loss: Secondary | ICD-10-CM

## 2016-07-27 DIAGNOSIS — Z95828 Presence of other vascular implants and grafts: Secondary | ICD-10-CM

## 2016-07-27 LAB — CBC WITH DIFFERENTIAL (CANCER CENTER ONLY)
BASO#: 0 10*3/uL (ref 0.0–0.2)
BASO%: 0.4 % (ref 0.0–2.0)
EOS ABS: 0.2 10*3/uL (ref 0.0–0.5)
EOS%: 3.7 % (ref 0.0–7.0)
HCT: 34.7 % — ABNORMAL LOW (ref 34.8–46.6)
HGB: 11.7 g/dL (ref 11.6–15.9)
LYMPH#: 1.8 10*3/uL (ref 0.9–3.3)
LYMPH%: 34 % (ref 14.0–48.0)
MCH: 33.9 pg (ref 26.0–34.0)
MCHC: 33.7 g/dL (ref 32.0–36.0)
MCV: 101 fL (ref 81–101)
MONO#: 0.3 10*3/uL (ref 0.1–0.9)
MONO%: 6.3 % (ref 0.0–13.0)
NEUT%: 55.6 % (ref 39.6–80.0)
NEUTROS ABS: 3 10*3/uL (ref 1.5–6.5)
Platelets: 171 10*3/uL (ref 145–400)
RBC: 3.45 10*6/uL — ABNORMAL LOW (ref 3.70–5.32)
RDW: 13.7 % (ref 11.1–15.7)
WBC: 5.4 10*3/uL (ref 3.9–10.0)

## 2016-07-27 LAB — CMP (CANCER CENTER ONLY)
ALT(SGPT): 22 U/L (ref 10–47)
AST: 26 U/L (ref 11–38)
Albumin: 3.9 g/dL (ref 3.3–5.5)
Alkaline Phosphatase: 56 U/L (ref 26–84)
BUN, Bld: 14 mg/dL (ref 7–22)
CO2: 25 meq/L (ref 18–33)
Calcium: 9.1 mg/dL (ref 8.0–10.3)
Chloride: 102 meq/L (ref 98–108)
Creat: 1.1 mg/dL (ref 0.6–1.2)
Glucose, Bld: 146 mg/dL — ABNORMAL HIGH (ref 73–118)
Potassium: 3.8 meq/L (ref 3.3–4.7)
Sodium: 141 meq/L (ref 128–145)
Total Bilirubin: 0.7 mg/dL (ref 0.20–1.60)
Total Protein: 6.4 g/dL (ref 6.4–8.1)

## 2016-07-27 LAB — CHCC SATELLITE - SMEAR

## 2016-07-27 MED ORDER — HEPARIN SOD (PORK) LOCK FLUSH 100 UNIT/ML IV SOLN
500.0000 [IU] | Freq: Once | INTRAVENOUS | Status: AC
  Administered 2016-07-27: 500 [IU] via INTRAVENOUS
  Filled 2016-07-27: qty 5

## 2016-07-27 MED ORDER — SODIUM CHLORIDE 0.9% FLUSH
10.0000 mL | INTRAVENOUS | Status: DC | PRN
  Administered 2016-07-27: 10 mL via INTRAVENOUS
  Filled 2016-07-27: qty 10

## 2016-07-27 NOTE — Progress Notes (Signed)
Hematology and Oncology Follow Up Visit  Kristina Meyer [id number] 11-13-1940 76 y.o. 07/27/2016   Principle Diagnosis:   Acute monocytic leukemia - normal cytogenetics - Remission  Current Therapy:    Status post cycle #4 of Dacogen-7 day cycle - completed   04/17/2016     Interim History:  [name] is  back for follow-up. She is looking like her old self. She really looks fantastic. She had an incredibly hard time back in November and December. She was hospitalized in the ICU with severe pneumonia and prolonged pancytopenia. Thankfully, she got through all this.   She looks wonderful. She is doing a lot more in the way of activities. She is eating well. She has not had any pain. There is no bleeding. She's had no cough. She's had no leg swelling. Has been no change in bowel or bladder habits.   She still has her Port-A-Cath in. I want to keep this in for right now.   There's been no rashes. She's had no fever. She's had no issues with influenza.  There's been no mouth sores. She's had no dysphasia or odynophagia.  She has had no headache.  Overall, her performance status has to be ECOG 1.  Medications:  Current Outpatient Prescriptions:  .  ALPRAZolam (XANAX) 0.5 MG tablet, TAKE 1 TABLET BY MOUTH 3 TIMES A DAY AS NEEDED (Patient taking differently: TAKE 1 TABLET BY MOUTH 3 TIMES A DAY AS NEEDED for anxiety.), Disp: 60 tablet, Rfl: 2 .  amoxicillin-clavulanate (AUGMENTIN) 875-125 MG tablet, Take 1 tablet by mouth 2 (two) times daily., Disp: 20 tablet, Rfl: 0 .  aspirin 81 MG tablet, Take 81 mg by mouth daily. , Disp: , Rfl:  .  atorvastatin (LIPITOR) 20 MG tablet, Take 10 mg by mouth at bedtime., Disp: , Rfl:  .  Calcium-Magnesium-Vitamin D (CALCIUM 1200+D3 PO), Take 1 tablet by mouth daily., Disp: , Rfl:  .  carvedilol (COREG) 3.125 MG tablet, Take 1 tablet (3.125 mg total) by mouth 2 (two) times daily., Disp: 180 tablet, Rfl: 3 .  cholecalciferol (VITAMIN D) 1000 UNITS tablet, Take  1,000 Units by mouth daily., Disp: , Rfl:  .  lidocaine-prilocaine (EMLA) cream, Apply 1 application topically as needed. Place quarter sized amount of cream directly on portacath and cover with saran wrap at least 1 - 1 1/2 hours prior to procedure., Disp: 30 g, Rfl: 3 .  losartan (COZAAR) 25 MG tablet, Take 1 tablet (25 mg total) by mouth daily., Disp: 90 tablet, Rfl: 3 .  magic mouthwash w/lidocaine SOLN, Take 5 mLs by mouth 4 (four) times daily as needed for mouth pain., Disp: 500 mL, Rfl: 6 .  Multiple Vitamins-Minerals (MULTIVITAMIN ADULT) TABS, Take 1 tablet by mouth daily., Disp: , Rfl:  .  orphenadrine (NORFLEX) 100 MG tablet, Take 1 tablet (100 mg total) by mouth at bedtime as needed for muscle spasms., Disp: 30 tablet, Rfl: 2 .  PARoxetine (PAXIL) 20 MG tablet, Take 20 mg by mouth daily., Disp: , Rfl:  .  potassium chloride SA (K-DUR,KLOR-CON) 20 MEQ tablet, Take 1 pill twice a day for 7 days, then 1 pill a day., Disp: 60 tablet, Rfl: 3 .  spironolactone (ALDACTONE) 25 MG tablet, Take 0.5 tablets (12.5 mg total) by mouth daily., Disp: 30 tablet, Rfl: 0 .  sulfamethoxazole-trimethoprim (BACTRIM DS) 800-160 MG per tablet, Take 1 tablet by mouth daily., Disp: , Rfl:  No current facility-administered medications for this visit.   Facility-Administered Medications Ordered in  Other Visits:  .  sodium chloride flush (NS) 0.9 % injection 10 mL, 10 mL, Intravenous, PRN, Holli Humbles [place], NP, 10 mL at 06/22/16 1403 .  sodium chloride flush (NS) 0.9 % injection 10 mL, 10 mL, Intravenous, PRN, Holli Humbles [place], NP, 10 mL at 07/27/16 1222  Allergies:  Allergies  Allergen Reactions  . Other Other (See Comments)    GENERAL Anesthesia, vomiting    Past Medical History, Surgical history, Social history, and Family History were reviewed and updated.  Review of Systems:  As above  Physical Exam:  oral temperature is 99.1 F (37.3 C). Her blood pressure is 113/54 (abnormal) and her pulse is  69. Her respiration is 17.   Wt Readings from Last 3 Encounters:  07/11/16 131 lb 3.2 oz (59.5 kg)  06/22/16 127 lb 12.8 oz (58 kg)  06/20/16 128 lb 12.8 oz (58.4 kg)      Well-developed and well-nourished white female in no obvious distress. Head and neck exam shows no ocular or oral lesions. There are no palpable cervical or supra clavicular lymph nodes. Lungs are clear bilaterally. Cardiac exam regular rate and rhythm with no murmurs, rubs or bruits. Abdomen is soft. She has good bowel sounds. There is no fluid wave. There is no palpable liver or spleen tip. Back exam shows no tenderness over the spine, ribs or hips. Externa shows no clubbing, cyanosis or edema. She has good range of motion of her joints. Neurological exam shows no focal neurological deficits.  Lab Results  Component Value Date   WBC 5.4 07/27/2016   HGB 11.7 07/27/2016   HCT 34.7 (L) 07/27/2016   MCV 101 07/27/2016   PLT 171 07/27/2016     Chemistry      Component Value Date/Time   NA 141 07/27/2016 1236   NA 139 02/08/2016 1009   K 3.8 07/27/2016 1236   K 4.1 02/08/2016 1009   CL 102 07/27/2016 1236   CO2 25 07/27/2016 1236   CO2 24 02/08/2016 1009   BUN 14 07/27/2016 1236   BUN 11.3 02/08/2016 1009   CREATININE 1.1 07/27/2016 1236   CREATININE 0.8 02/08/2016 1009      Component Value Date/Time   CALCIUM 9.1 07/27/2016 1236   CALCIUM 9.2 02/08/2016 1009   ALKPHOS 56 07/27/2016 1236   ALKPHOS 67 02/08/2016 1009   AST 26 07/27/2016 1236   AST 34 02/08/2016 1009   ALT 22 07/27/2016 1236   ALT 22 02/08/2016 1009   BILITOT 0.70 07/27/2016 1236   BILITOT 0.60 02/08/2016 1009         Impression and Plan: Ms. Rassel is a 76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 Her cytogenetics are normal which I would think would be a good prognostic sign.   Her blood looks great. Under the microscope, I do not see nothing that looks suspicious. I do not see anything that looks like relapse of the AML.  I  think we can get her back in 2 months now. I want to try to spread her appointment out a little further.  I'm just glad that her quality of life is doing as well as it is. She is enjoying herself again. I know that she will have a wonderful Easter.    Volanda Napoleon, MD 3/2/20182:07 PM

## 2016-07-30 LAB — LACTATE DEHYDROGENASE: LDH: 244 U/L (ref 125–245)

## 2016-08-02 ENCOUNTER — Ambulatory Visit (HOSPITAL_BASED_OUTPATIENT_CLINIC_OR_DEPARTMENT_OTHER): Payer: Medicare Other

## 2016-08-02 ENCOUNTER — Ambulatory Visit (HOSPITAL_COMMUNITY)
Admission: RE | Admit: 2016-08-02 | Discharge: 2016-08-02 | Disposition: A | Discharge status: Home / Self Care | Payer: Medicare Other | Source: Ambulatory Visit | Attending: Cardiology | Admitting: Cardiology

## 2016-08-02 DIAGNOSIS — E785 Hyperlipidemia, unspecified: Secondary | ICD-10-CM | POA: Insufficient documentation

## 2016-08-02 DIAGNOSIS — I509 Heart failure, unspecified: Secondary | ICD-10-CM | POA: Diagnosis not present

## 2016-08-02 DIAGNOSIS — Z856 Personal history of leukemia: Secondary | ICD-10-CM | POA: Diagnosis not present

## 2016-08-02 DIAGNOSIS — I081 Rheumatic disorders of both mitral and tricuspid valves: Secondary | ICD-10-CM | POA: Diagnosis not present

## 2016-08-02 LAB — ECHOCARDIOGRAM COMPLETE
AOASC: 27 cm
CHL CUP MV DEC (S): 229
CHL CUP TV REG PEAK VELOCITY: 257 cm/s
E decel time: 229 msec
EERAT: 11.77
FS: 32 % (ref 28–44)
IVS/LV PW RATIO, ED: 1.16
LA diam end sys: 35 mm
LA vol A4C: 44 ml
LA vol index: 30.7 mL/m2
LADIAMINDEX: 2.16 cm/m2
LASIZE: 35 mm
LAVOL: 49.8 mL
LV TDI E'LATERAL: 5.77
LV e' LATERAL: 5.77 cm/s
LVEEAVG: 11.77
LVEEMED: 11.77
LVOT SV: 59 mL
LVOT VTI: 20.6 cm
LVOT area: 2.84 cm2
LVOT diameter: 19 mm
LVOT peak vel: 95.3 cm/s
MV pk A vel: 94.6 m/s
MVPKEVEL: 67.9 m/s
PW: 9.28 mm — AB (ref 0.6–1.1)
RV LATERAL S' VELOCITY: 11.8 cm/s
RV TAPSE: 27.6 mm
RV sys press: 34 mmHg
S' Lateral: 6.31 cm/s
TDI e' medial: 4.68
TRMAXVEL: 257 cm/s

## 2016-08-02 NOTE — Progress Notes (Signed)
  Echocardiogram 2D Echocardiogram has been performed.  Bobbye Charleston 08/02/2016, 12:03 PM

## 2016-08-15 ENCOUNTER — Other Ambulatory Visit (HOSPITAL_BASED_OUTPATIENT_CLINIC_OR_DEPARTMENT_OTHER): Payer: Medicare Other

## 2016-08-17 NOTE — Progress Notes (Signed)
HPI: FU cardiomyopathy. Pt with AML receiving decitabine. Chest CT November 2017 showed moderate to large left pleural effusion. Admitted with presumed pneumonia. Hgb 6.7. Treated with antibiotics and thoracentesis but ultimately required intubation. Had chest tube placed. Echocardiogram December 2017 showed ejection fraction 57%, grade 2 diastolic dysfunction, mild left ventricular hypertrophy and wall motion abnormalities. Mild to moderate mitral regurgitation and moderate left atrial enlargement. Pt ultimately improved and DCed; asked to fu with cardiology. 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 We treated her with medications and follow-up echocardiogram March 2018 showed ejection fraction 01-77%, grade 1 diastolic dysfunction and mild mitral regurgitation. Since last seen,  the patient denies any dyspnea on exertion, orthopnea, PND, pedal edema, palpitations, syncope or chest pain.   Current Outpatient Prescriptions  Medication Sig Dispense Refill  . ALPRAZolam (XANAX) 0.5 MG tablet TAKE 1 TABLET BY MOUTH 3 TIMES A DAY AS NEEDED (Patient taking differently: TAKE 1 TABLET BY MOUTH 3 TIMES A DAY AS NEEDED for anxiety.) 60 tablet 2  . aspirin 81 MG tablet Take 81 mg by mouth daily.     Marland Kitchen atorvastatin (LIPITOR) 20 MG tablet Take 10 mg by mouth at bedtime.    . Calcium-Magnesium-Vitamin D (CALCIUM 1200+D3 PO) Take 1 tablet by mouth daily.    . carvedilol (COREG) 3.125 MG tablet Take 1 tablet (3.125 mg total) by mouth 2 (two) times daily. 180 tablet 3  . cholecalciferol (VITAMIN D) 1000 UNITS tablet Take 1,000 Units by mouth daily.    Marland Kitchen lidocaine-prilocaine (EMLA) cream Apply 1 application topically as needed. Place quarter sized amount of cream directly on portacath and cover with saran wrap at least 1 - 1 1/2 hours prior to procedure. 30 g 3  . losartan (COZAAR) 25 MG tablet Take 1 tablet (25 mg total) by mouth daily. 90 tablet 3  . magic mouthwash  w/lidocaine SOLN Take 5 mLs by mouth 4 (four) times daily as needed for mouth pain. 500 mL 6  . Multiple Vitamins-Minerals (MULTIVITAMIN ADULT) TABS Take 1 tablet by mouth daily.    . orphenadrine (NORFLEX) 100 MG tablet Take 1 tablet (100 mg total) by mouth at bedtime as needed for muscle spasms. 30 tablet 2  . PARoxetine (PAXIL) 20 MG tablet Take 20 mg by mouth daily.    . potassium chloride SA (K-DUR,KLOR-CON) 20 MEQ tablet Take 1 pill twice a day for 7 days, then 1 pill a day. 60 tablet 3  . spironolactone (ALDACTONE) 25 MG tablet Take 0.5 tablets (12.5 mg total) by mouth daily. 30 tablet 0  . sulfamethoxazole-trimethoprim (BACTRIM DS) 800-160 MG per tablet Take 1 tablet by mouth daily.     No current facility-administered medications for this visit.    Facility-Administered Medications Ordered in Other Visits  Medication Dose Route Frequency Provider Last Rate Last Dose  . sodium chloride flush (NS) 0.9 % injection 10 mL  10 mL Intravenous PRN Eliezer Bottom, NP   10 mL at 06/22/16 1403     Past Medical History:  Diagnosis Date  . Acute systolic CHF (congestive heart failure) ([place])    a. 04/2016: EF 35%, Grade 2 DD, Severe HK, mild to moderate MR  . AML M5 (acute monocytic leukemia) ([place]) 01/05/2016  . Anxiety   . Heart murmur    echo done 20 yrs ago  normal  . Hyperlipidemia   . Hypertension   . MVP (mitral valve prolapse)   .  PONV (postoperative nausea and vomiting)    req patch    Past Surgical History:  Procedure Laterality Date  . BREAST CYST EXCISION Left 1970  . BREAST SURGERY Left 76   lump   . EYE SURGERY Bilateral 09   cataracts  . HEMORRHOID SURGERY  85  . IR GENERIC HISTORICAL  01/11/2016   IR US GUIDE VASC ACCESS RIGHT 01/11/2016 Jacqulynn Cadet, MD [facility]  . IR GENERIC HISTORICAL  01/11/2016   IR FLUORO GUIDE CV LINE RIGHT 01/11/2016 Jacqulynn Cadet, MD [facility]  . LUMBAR LAMINECTOMY/DECOMPRESSION MICRODISCECTOMY Right 12/02/2013   Procedure:  L5-S1 RIGHT DISCECTOMY;  Surgeon: Melina Schools, MD;  Location: [facility];  Service: Orthopedics;  Laterality: Right;    Social History   Social History  . Marital status: Married    Spouse name: N/A  . Number of children: 1  . Years of education: N/A   Occupational History  . Not on file.   Social History Main Topics  . Smoking status: Former Smoker    Packs/day: 0.50    Years: 5.00    Types: Cigarettes    Quit date: 12/02/1966  . Smokeless tobacco: Never Used  . Alcohol use No  . Drug use: No  . Sexual activity: Not on file   Other Topics Concern  . Not on file   Social History Narrative  . No narrative on file    Family History  Problem Relation Age of Onset  . Heart failure Mother   . Depression Mother   . Heart attack Father 66  . Leukemia Cousin     ROS: no fevers or chills, productive cough, hemoptysis, dysphasia, odynophagia, melena, hematochezia, dysuria, hematuria, rash, seizure activity, orthopnea, PND, pedal edema, claudication. Remaining systems are negative.  Physical Exam: Well-developed well-nourished in no acute distress.  Skin is warm and dry.  HEENT is normal.  Neck is supple. No bruits Chest is clear to auscultation with normal expansion.  Cardiovascular exam is regular rate and rhythm.  Abdominal exam nontender or distended. No masses palpated. Extremities show no edema. 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 Her LV function has improved. We will continue with ARB and beta blocker (increase coreg to 6.25 BID). DC spironolactone. Previous TSH mildly elevated but free T4 normal.   2 Hypertension-blood pressure controlled. Continue present medications other than changes outlined above.  3 leukemia-managed by oncology.  4 LBBB  Kirk Ruths, MD

## 2016-08-22 ENCOUNTER — Encounter: Payer: Self-pay | Admitting: Cardiology

## 2016-08-22 ENCOUNTER — Ambulatory Visit (INDEPENDENT_AMBULATORY_CARE_PROVIDER_SITE_OTHER): Payer: Medicare Other | Admitting: Cardiology

## 2016-08-22 VITALS — BP 115/68 | HR 71 | Ht 62.0 in | Wt 133.8 lb

## 2016-08-22 DIAGNOSIS — I509 Heart failure, unspecified: Secondary | ICD-10-CM | POA: Diagnosis not present

## 2016-08-22 DIAGNOSIS — I447 Left bundle-branch block, unspecified: Secondary | ICD-10-CM | POA: Diagnosis not present

## 2016-08-22 DIAGNOSIS — I10 Essential (primary) hypertension: Secondary | ICD-10-CM

## 2016-08-22 MED ORDER — CARVEDILOL 6.25 MG PO TABS: 6.2500 mg | ORAL_TABLET | Freq: Two times a day (BID) | ORAL | 3 refills | Status: AC

## 2016-08-22 NOTE — Patient Instructions (Signed)
Medication Instructions:   INCREASE CARVEDILOL TO 6.25 MG TWICE DAILY= 2 OF THE 3.125 MG TWICE DAILY  STOP SPIRONOLACTONE  Follow-Up:  Your physician wants you to follow-up in: [place] will receive a reminder letter in the mail two months in advance. If you don't receive a letter, please call our office to schedule the follow-up appointment.   If you need a refill on your cardiac medications before your next appointment, please call your pharmacy.

## 2016-09-03 ENCOUNTER — Other Ambulatory Visit: Payer: Self-pay

## 2016-09-03 MED ORDER — ATORVASTATIN CALCIUM 20 MG PO TABS: 10.0000 mg | ORAL_TABLET | Freq: Every day | ORAL | 3 refills | Status: DC

## 2016-09-13 ENCOUNTER — Ambulatory Visit (HOSPITAL_BASED_OUTPATIENT_CLINIC_OR_DEPARTMENT_OTHER): Payer: Medicare Other

## 2016-09-13 ENCOUNTER — Ambulatory Visit (HOSPITAL_BASED_OUTPATIENT_CLINIC_OR_DEPARTMENT_OTHER): Payer: Medicare Other | Admitting: Family

## 2016-09-13 ENCOUNTER — Ambulatory Visit (HOSPITAL_COMMUNITY)
Admission: RE | Admit: 2016-09-13 | Discharge: 2016-09-13 | Disposition: A | Discharge status: Home / Self Care | Payer: Medicare Other | Source: Ambulatory Visit | Attending: Hematology & Oncology | Admitting: Hematology & Oncology

## 2016-09-13 VITALS — BP 122/50 | HR 81 | Temp 98.4°F | Resp 19 | Wt 134.1 lb

## 2016-09-13 DIAGNOSIS — C9302 Acute monoblastic/monocytic leukemia, in relapse: Secondary | ICD-10-CM

## 2016-09-13 DIAGNOSIS — C9202 Acute myeloblastic leukemia, in relapse: Secondary | ICD-10-CM

## 2016-09-13 DIAGNOSIS — C9300 Acute monoblastic/monocytic leukemia, not having achieved remission: Secondary | ICD-10-CM

## 2016-09-13 DIAGNOSIS — D696 Thrombocytopenia, unspecified: Secondary | ICD-10-CM

## 2016-09-13 LAB — MANUAL DIFFERENTIAL (CHCC SATELLITE)
ALC: 5.1 10*3/uL — ABNORMAL HIGH (ref 0.6–2.2)
ANC (CHCC MAN DIFF): 9.3 10*3/uL — AB (ref 1.5–6.7)
BAND NEUTROPHILS: 8 % (ref 0–10)
Blasts: 16 % — ABNORMAL HIGH (ref 0–0)
EOS: 4 % (ref 0–7)
LYMPH: 27 % (ref 14–48)
MONO: 4 % (ref 0–13)
Metamyelocytes: 2 % — ABNORMAL HIGH (ref 0–0)
Myelocytes: 3 % — ABNORMAL HIGH (ref 0–0)
PLT EST ~~LOC~~: DECREASED
SEG: 36 % — AB (ref 40–75)
nRBC: 2 % — ABNORMAL HIGH (ref 0–0)

## 2016-09-13 LAB — CBC WITH DIFFERENTIAL (CANCER CENTER ONLY)
HCT: 32.1 % — ABNORMAL LOW (ref 34.8–46.6)
HGB: 10.5 g/dL — ABNORMAL LOW (ref 11.6–15.9)
MCH: 32.9 pg (ref 26.0–34.0)
MCHC: 32.7 g/dL (ref 32.0–36.0)
MCV: 101 fL (ref 81–101)
PLATELETS: 39 10*3/uL — AB (ref 145–400)
RBC: 3.19 10*6/uL — ABNORMAL LOW (ref 3.70–5.32)
RDW: 12.9 % (ref 11.1–15.7)
WBC: 19 10*3/uL — AB (ref 3.9–10.0)

## 2016-09-13 LAB — CHCC SATELLITE - SMEAR

## 2016-09-13 MED ORDER — SODIUM CHLORIDE 0.9% FLUSH
10.0000 mL | INTRAVENOUS | Status: DC | PRN
  Administered 2016-09-13: 10 mL via INTRAVENOUS
  Filled 2016-09-13: qty 10

## 2016-09-13 MED ORDER — HEPARIN SOD (PORK) LOCK FLUSH 100 UNIT/ML IV SOLN
500.0000 [IU] | Freq: Once | INTRAVENOUS | Status: AC
  Administered 2016-09-13: 500 [IU] via INTRAVENOUS
  Filled 2016-09-13: qty 5

## 2016-09-13 NOTE — Patient Instructions (Signed)
Implanted Port Insertion, Care After °This sheet gives you information about how to care for yourself after your procedure. Your health care provider may also give you more specific instructions. If you have problems or questions, contact your health care provider. °What can I expect after the procedure? °After your procedure, it is common to have: °· Discomfort at the port insertion site. °· Bruising on the skin over the port. This should improve over 3-4 days. ° °Follow these instructions at home: °Port care °· After your port is placed, you will get a manufacturer's information card. The card has information about your port. Keep this card with you at all times. °· Take care of the port as told by your health care provider. Ask your health care provider if you or a family member can get training for taking care of the port at home. A home health care nurse may also take care of the port. °· Make sure to remember what type of port you have. °Incision care °· Follow instructions from your health care provider about how to take care of your port insertion site. Make sure you: °? Wash your hands with soap and water before you change your bandage (dressing). If soap and water are not available, use hand sanitizer. °? Change your dressing as told by your health care provider. °? Leave stitches (sutures), skin glue, or adhesive strips in place. These skin closures may need to stay in place for 2 weeks or longer. If adhesive strip edges start to loosen and curl up, you may trim the loose edges. Do not remove adhesive strips completely unless your health care provider tells you to do that. °· Check your port insertion site every day for signs of infection. Check for: °? More redness, swelling, or pain. °? More fluid or blood. °? Warmth. °? Pus or a bad smell. °General instructions °· Do not take baths, swim, or use a hot tub until your health care provider approves. °· Do not lift anything that is heavier than 10 lb (4.5  kg) for a week, or as told by your health care provider. °· Ask your health care provider when it is okay to: °? Return to work or school. °? Resume usual physical activities or sports. °· Do not drive for 24 hours if you were given a medicine to help you relax (sedative). °· Take over-the-counter and prescription medicines only as told by your health care provider. °· Wear a medical alert bracelet in case of an emergency. This will tell any health care providers that you have a port. °· Keep all follow-up visits as told by your health care provider. This is important. °Contact a health care provider if: °· You cannot flush your port with saline as directed, or you cannot draw blood from the port. °· You have a fever or chills. °· You have more redness, swelling, or pain around your port insertion site. °· You have more fluid or blood coming from your port insertion site. °· Your port insertion site feels warm to the touch. °· You have pus or a bad smell coming from the port insertion site. °Get help right away if: °· You have chest pain or shortness of breath. °· You have bleeding from your port that you cannot control. °Summary °· Take care of the port as told by your health care provider. °· Change your dressing as told by your health care provider. °· Keep all follow-up visits as told by your health care provider. °  This information is not intended to replace advice given to you by your health care provider. Make sure you discuss any questions you have with your health care provider. °Document Released: 03/04/2013 Document Revised: 04/04/2016 Document Reviewed: 04/04/2016 °Elsevier Interactive Patient Education © 2017 [employer] ° °

## 2016-09-13 NOTE — Progress Notes (Addendum)
Hematology and Oncology Follow Up Visit  Da Michelle [id number] 1941-04-04 76 y.o. 09/13/2016   Principle Diagnosis:  Acute monocytic leukemia - relapsed   Current Therapy: Status post cycle 4 of Dacogen - 7 day cycle - completed 04/17/2016   Interim History:  Ms. Bateson is here today with her husband for her routine follow-up. Unfortunately, it appears that she has relapsed. She now has blasts, 16%, on her diff and WBC count is up at 19.0. Hgb is 10.5 with a platelet count os 39. She has bruises on her forearms where she tipped over on her seat working in her yard. No episodes of bleeding, no petechial rash. 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   She denies fatigue. No fever, chills, n/v, cough, dizziness, SOB, chest pain, palpitations, abdominal pain or changes in bowel or bladder habits.  No swelling, tenderness, numbness or tingling in her extremities. No c/o pain.  She has maintained a good appetite and is staying well hydrated. Her weight is stable.   ECOG Performance Status: 1 - Symptomatic but completely ambulatory  Medications:  Allergies as of 09/13/2016      Reactions   Other Other (See Comments)   GENERAL Anesthesia, vomiting      Medication List       Accurate as of 09/13/16 10:28 AM. Always use your most recent med list.          ALPRAZolam 0.5 MG tablet Commonly known as:  XANAX TAKE 1 TABLET BY MOUTH 3 TIMES A DAY AS NEEDED   aspirin 81 MG tablet Take 81 mg by mouth daily.   atorvastatin 20 MG tablet Commonly known as:  LIPITOR Take 0.5 tablets (10 mg total) by mouth at bedtime.   CALCIUM 1200+D3 PO Take 1 tablet by mouth daily.   carvedilol 6.25 MG tablet Commonly known as:  COREG Take 1 tablet (6.25 mg total) by mouth 2 (two) times daily.   cholecalciferol 1000 units tablet Commonly known as:  VITAMIN D Take 1,000 Units by mouth daily.   lidocaine-prilocaine cream Commonly known as:  EMLA Apply 1 application  topically as needed. Place quarter sized amount of cream directly on portacath and cover with saran wrap at least 1 - 1 1/2 hours prior to procedure.   losartan 25 MG tablet Commonly known as:  COZAAR Take 1 tablet (25 mg total) by mouth daily.   magic mouthwash w/lidocaine Soln Take 5 mLs by mouth 4 (four) times daily as needed for mouth pain.   MULTIVITAMIN ADULT Tabs Take 1 tablet by mouth daily.   orphenadrine 100 MG tablet Commonly known as:  NORFLEX Take 1 tablet (100 mg total) by mouth at bedtime as needed for muscle spasms.   PARoxetine 20 MG tablet Commonly known as:  PAXIL Take 20 mg by mouth daily.   spironolactone 25 MG tablet Commonly known as:  ALDACTONE   sulfamethoxazole-trimethoprim 800-160 MG per tablet Commonly known as:  BACTRIM DS Take 1 tablet by mouth daily.       Allergies:  Allergies  Allergen Reactions  . Other Other (See Comments)    GENERAL Anesthesia, vomiting    Past Medical History, Surgical history, Social history, and Family History were reviewed and updated.  Review of Systems: All other 10 point review of systems is negative.   Physical Exam:  weight is 134 lb 1.9 oz (60.8 kg). Her oral temperature is 98.4 F (36.9 C). Her blood pressure is 122/50 (abnormal) and her  pulse is 81. Her respiration is 19.   Wt Readings from Last 3 Encounters:  09/13/16 134 lb 1.9 oz (60.8 kg)  08/22/16 133 lb 12.8 oz (60.7 kg)  07/11/16 131 lb 3.2 oz (59.5 kg)    Ocular: Sclerae unicteric, pupils equal, round and reactive to light Ear-nose-throat: Oropharynx clear, dentition fair Lymphatic: No cervical, supraclavicular or axillary adenopathy Lungs no rales or rhonchi, good excursion bilaterally Heart regular rate and rhythm, no murmur appreciated Abd soft, nontender, positive bowel sounds, no liver or spleen tip palpated on exam, no fluid wave, she has multiple chloromas across the lower abdomen   MSK no focal spinal tenderness, no joint  edema Neuro: non-focal, well-oriented, appropriate affect Breasts: Deferred  Lab Results  Component Value Date   WBC 5.4 07/27/2016   HGB 11.7 07/27/2016   HCT 34.7 (L) 07/27/2016   MCV 101 07/27/2016   PLT 171 07/27/2016   No results found for: FERRITIN, IRON, TIBC, UIBC, IRONPCTSAT Lab Results  Component Value Date   RBC 3.45 (L) 07/27/2016   No results found for: KPAFRELGTCHN, LAMBDASER, KAPLAMBRATIO No results found for: IGGSERUM, IGA, IGMSERUM No results found for: Odetta Pink, SPEI   Chemistry      Component Value Date/Time   NA 141 07/27/2016 1236   NA 139 02/08/2016 1009   K 3.8 07/27/2016 1236   K 4.1 02/08/2016 1009   CL 102 07/27/2016 1236   CO2 25 07/27/2016 1236   CO2 24 02/08/2016 1009   BUN 14 07/27/2016 1236   BUN 11.3 02/08/2016 1009   CREATININE 1.1 07/27/2016 1236   CREATININE 0.8 02/08/2016 1009      Component Value Date/Time   CALCIUM 9.1 07/27/2016 1236   CALCIUM 9.2 02/08/2016 1009   ALKPHOS 56 07/27/2016 1236   ALKPHOS 67 02/08/2016 1009   AST 26 07/27/2016 1236   AST 34 02/08/2016 1009   ALT 22 07/27/2016 1236   ALT 22 02/08/2016 1009   BILITOT 0.70 07/27/2016 1236   BILITOT 0.60 02/08/2016 1009      Impression and Plan: Ms. Mantey is a 76 yo white female with AML - monocytic subtype and has unfortunately relapsed. She now has 16% blasts, WBC count is creeping up at 19.0 and platelet count is down at 39. She has some bruising or the forearms and chloromas across the lower abdomen.  Dr. Marin Olp has spoken with Dr. Jacinto Reap. Florene Glen at [facility] and he has agreed to see the patient on Tuesday next week.  We will check another CBC tomorrow and give her platelets tomorrow to try and improve her count.  We have also ordered an AML panel.  With her unexpected relapse this was a joint visit with Dr. Marin Olp. We spent at least 30 minutes face to face counseling the patient and her husband.  We will plan  to see her back once she has met with and we have followed up with Dr. Florene Glen.  Both she and her sweet husband know to contact our office with any questions or concerns.   Eliezer Bottom, NP 4/19/201810:28 AM   ADDENDUM:  I saw and examined Ms. Blanch Media. I will get her blood smear. She clearly has relapsed AML. She has myelo- blasts that were noted on the blood smear. There may have been a couple of blasts that had Auer rods.   She has been in remission now for probably 7 or 8 months. She had that horrible hospital stay back in November  where she was profoundly pancytopenic after her fourth cycle of treatment. We did the bone marrow biopsy and aspirate on her in December, there was no evidence of obvious AML on the aspirate.  She responded so well to decitabine. I worry about using decitabine again because of this hospitalization that she had.  I would like to see if there might be a clinical option for her at [facility]. I spoke with Dr. Jerrye Noble. He is a Engineer, materials that I utilize in these difficult cases.  I went to send her peripheral blood off for next generation sequencing to see of there aren't any mutations that we might be able to target. Specifically, I'm looking for the IDH mutation so that we might be able to use enasidenib (Idhifa).  I realize that we can go back to decitabine. We also could use Vidaza.  Dr. Florene Glen said he will see her on Tuesday.  I talked to Ms. Leidner and her husband at length. We probably spent about 45 minutes with them. I told her that if nothing was effective, then I would not think that she would make it more than 2 or 3 months. I think even if something were to work, we probably would still be looking at less than 8-10 months. She understands this.  I just absolutely hates that this is having. She has done incredibly well. I thought we might be able to keep her in remission for a lot longer.  We will await to hear the results from her  consultation at [place], [place]

## 2016-09-14 ENCOUNTER — Other Ambulatory Visit (HOSPITAL_BASED_OUTPATIENT_CLINIC_OR_DEPARTMENT_OTHER): Payer: Medicare Other

## 2016-09-14 ENCOUNTER — Ambulatory Visit (HOSPITAL_BASED_OUTPATIENT_CLINIC_OR_DEPARTMENT_OTHER): Payer: Medicare Other

## 2016-09-14 ENCOUNTER — Ambulatory Visit: Payer: Medicare Other

## 2016-09-14 DIAGNOSIS — C9202 Acute myeloblastic leukemia, in relapse: Secondary | ICD-10-CM | POA: Diagnosis not present

## 2016-09-14 DIAGNOSIS — D696 Thrombocytopenia, unspecified: Secondary | ICD-10-CM

## 2016-09-14 DIAGNOSIS — C9302 Acute monoblastic/monocytic leukemia, in relapse: Secondary | ICD-10-CM

## 2016-09-14 LAB — CBC WITH DIFFERENTIAL (CANCER CENTER ONLY)
HCT: 31 % — ABNORMAL LOW (ref 34.8–46.6)
HGB: 10.1 g/dL — ABNORMAL LOW (ref 11.6–15.9)
MCH: 32.9 pg (ref 26.0–34.0)
MCHC: 32.6 g/dL (ref 32.0–36.0)
MCV: 101 fL (ref 81–101)
PLATELETS: 33 10*3/uL — AB (ref 145–400)
RBC: 3.07 10*6/uL — AB (ref 3.70–5.32)
RDW: 13 % (ref 11.1–15.7)
WBC: 19.6 10*3/uL — ABNORMAL HIGH (ref 3.9–10.0)

## 2016-09-14 LAB — MANUAL DIFFERENTIAL (CHCC SATELLITE)
ALC: 4.9 10*3/uL — AB (ref 0.6–2.2)
ANC (CHCC HP manual diff): 10.8 10*3/uL — ABNORMAL HIGH (ref 1.5–6.7)
BASO: 1 % (ref 0–2)
Band Neutrophils: 5 % (ref 0–10)
Blasts: 16 % — ABNORMAL HIGH (ref 0–0)
Eos: 2 % (ref 0–7)
LYMPH: 25 % (ref 14–48)
METAMYELOCYTES PCT: 2 % — AB (ref 0–0)
MONO: 1 % (ref 0–13)
Myelocytes: 4 % — ABNORMAL HIGH (ref 0–0)
NRBC: 5 % — AB (ref 0–0)
PLT EST ~~LOC~~: DECREASED
SEG: 44 % (ref 40–75)

## 2016-09-14 MED ORDER — HEPARIN SOD (PORK) LOCK FLUSH 100 UNIT/ML IV SOLN
500.0000 [IU] | Freq: Every day | INTRAVENOUS | Status: AC | PRN
  Administered 2016-09-14: 500 [IU]
  Filled 2016-09-14: qty 5

## 2016-09-14 MED ORDER — DIPHENHYDRAMINE HCL 25 MG PO CAPS
ORAL_CAPSULE | ORAL | Status: AC
  Filled 2016-09-14: qty 1

## 2016-09-14 MED ORDER — SODIUM CHLORIDE 0.9% FLUSH
10.0000 mL | INTRAVENOUS | Status: AC | PRN
  Administered 2016-09-14: 10 mL
  Filled 2016-09-14: qty 10

## 2016-09-14 MED ORDER — ACETAMINOPHEN 325 MG PO TABS
ORAL_TABLET | ORAL | Status: AC
  Filled 2016-09-14: qty 2

## 2016-09-14 MED ORDER — ACETAMINOPHEN 325 MG PO TABS
650.0000 mg | ORAL_TABLET | Freq: Once | ORAL | Status: AC
  Administered 2016-09-14: 650 mg via ORAL

## 2016-09-14 MED ORDER — DIPHENHYDRAMINE HCL 25 MG PO CAPS
25.0000 mg | ORAL_CAPSULE | Freq: Once | ORAL | Status: AC
  Administered 2016-09-14: 25 mg via ORAL

## 2016-09-14 MED ORDER — SODIUM CHLORIDE 0.9 % IV SOLN
250.0000 mL | Freq: Once | INTRAVENOUS | Status: AC
  Administered 2016-09-14: 250 mL via INTRAVENOUS

## 2016-09-14 NOTE — Patient Instructions (Signed)
Platelet Transfusion, Care After Refer to this sheet in the next few weeks. These instructions provide you with information about caring for yourself after your procedure. Your health care provider may also give you more specific instructions. Your treatment has been planned according to current medical practices, but problems sometimes occur. Call your health care provider if you have any problems or questions after your procedure. What can I expect after the procedure? After the procedure, it is common to have:  Bruising and soreness at the IV site.  Fever or chills within the first 48 hours of your transfusion. Follow these instructions at home:  Take medicines only as directed by your health care provider. Ask your health care provider if you can take an over-the-counter pain reliever in case you have a fever or headache a day or two after your transfusion.  Return to your normal activities as directed by your health care provider. Contact a health care provider if:  You have a fever.  You have a headache.  You have redness, swelling, or pain at your IV site.  You have skin itching or a rash.  You vomit.  You feel unusually tired or weak. Get help right away if:  You have trouble breathing.  You have a decreased amount of urine or you urinate less often than you normally do.  Your urine is darker than normal.  You have pain in your back, abdomen, or chest.  You have cool, clammy skin.  You have a rapid heartbeat. This information is not intended to replace advice given to you by your health care provider. Make sure you discuss any questions you have with your health care provider. Document Released: 06/04/2014 Document Revised: 10/20/2015 Document Reviewed: 03/24/2014 Elsevier Interactive Patient Education  2017 [employer].

## 2016-09-14 NOTE — Patient Instructions (Signed)
Implanted Port Insertion, Care After °This sheet gives you information about how to care for yourself after your procedure. Your health care provider may also give you more specific instructions. If you have problems or questions, contact your health care provider. °What can I expect after the procedure? °After your procedure, it is common to have: °· Discomfort at the port insertion site. °· Bruising on the skin over the port. This should improve over 3-4 days. ° °Follow these instructions at home: °Port care °· After your port is placed, you will get a manufacturer's information card. The card has information about your port. Keep this card with you at all times. °· Take care of the port as told by your health care provider. Ask your health care provider if you or a family member can get training for taking care of the port at home. A home health care nurse may also take care of the port. °· Make sure to remember what type of port you have. °Incision care °· Follow instructions from your health care provider about how to take care of your port insertion site. Make sure you: °? Wash your hands with soap and water before you change your bandage (dressing). If soap and water are not available, use hand sanitizer. °? Change your dressing as told by your health care provider. °? Leave stitches (sutures), skin glue, or adhesive strips in place. These skin closures may need to stay in place for 2 weeks or longer. If adhesive strip edges start to loosen and curl up, you may trim the loose edges. Do not remove adhesive strips completely unless your health care provider tells you to do that. °· Check your port insertion site every day for signs of infection. Check for: °? More redness, swelling, or pain. °? More fluid or blood. °? Warmth. °? Pus or a bad smell. °General instructions °· Do not take baths, swim, or use a hot tub until your health care provider approves. °· Do not lift anything that is heavier than 10 lb (4.5  kg) for a week, or as told by your health care provider. °· Ask your health care provider when it is okay to: °? Return to work or school. °? Resume usual physical activities or sports. °· Do not drive for 24 hours if you were given a medicine to help you relax (sedative). °· Take over-the-counter and prescription medicines only as told by your health care provider. °· Wear a medical alert bracelet in case of an emergency. This will tell any health care providers that you have a port. °· Keep all follow-up visits as told by your health care provider. This is important. °Contact a health care provider if: °· You cannot flush your port with saline as directed, or you cannot draw blood from the port. °· You have a fever or chills. °· You have more redness, swelling, or pain around your port insertion site. °· You have more fluid or blood coming from your port insertion site. °· Your port insertion site feels warm to the touch. °· You have pus or a bad smell coming from the port insertion site. °Get help right away if: °· You have chest pain or shortness of breath. °· You have bleeding from your port that you cannot control. °Summary °· Take care of the port as told by your health care provider. °· Change your dressing as told by your health care provider. °· Keep all follow-up visits as told by your health care provider. °  This information is not intended to replace advice given to you by your health care provider. Make sure you discuss any questions you have with your health care provider. °Document Released: 03/04/2013 Document Revised: 04/04/2016 Document Reviewed: 04/04/2016 °Elsevier Interactive Patient Education © 2017 [employer] ° °

## 2016-09-16 LAB — BPAM PLATELET PHERESIS
Blood Product Expiration Date: 201804212359
ISSUE DATE / TIME: 201804200840
UNIT TYPE AND RH: 6200

## 2016-09-16 LAB — PREPARE PLATELET PHERESIS: Unit division: 0

## 2016-09-17 ENCOUNTER — Telehealth: Payer: Self-pay | Admitting: *Deleted

## 2016-09-17 NOTE — Telephone Encounter (Signed)
"  In looking over the information from [facility] for my referral to Dr. Florene Glen I am to make sure any biopsy and surgical information has been sent to [facility].  I'm calling to confirm pathology slides and all information was sent.  Please return this call."

## 2016-09-20 ENCOUNTER — Telehealth: Payer: Self-pay | Admitting: *Deleted

## 2016-09-20 NOTE — Telephone Encounter (Signed)
Call received from patient stating that she had a bone marrow biopsy on Tuesday.  She removed the dressing after 24 hours and took a shower last night and woke up to small dried blood spots on her underwear and is concerned about bleeding.  She states that her husband placed a band-aid on BM site this AM and band-aid is clean, dry and intact at this time.  Instructed pt to apply pressure to BM site and if bleeding continues after 30 minutes to call Dr. Antonieta Pert office back.  Patient appreciative of information and has no questions at this time.

## 2016-09-21 ENCOUNTER — Other Ambulatory Visit: Payer: Self-pay | Admitting: Hematology & Oncology

## 2016-09-24 ENCOUNTER — Ambulatory Visit (HOSPITAL_BASED_OUTPATIENT_CLINIC_OR_DEPARTMENT_OTHER): Payer: Medicare Other

## 2016-09-24 ENCOUNTER — Other Ambulatory Visit: Payer: Self-pay | Admitting: Family

## 2016-09-24 VITALS — BP 118/44 | HR 90 | Temp 97.5°F | Resp 16

## 2016-09-24 DIAGNOSIS — C9300 Acute monoblastic/monocytic leukemia, not having achieved remission: Secondary | ICD-10-CM

## 2016-09-24 DIAGNOSIS — C9202 Acute myeloblastic leukemia, in relapse: Secondary | ICD-10-CM | POA: Diagnosis not present

## 2016-09-24 DIAGNOSIS — C9302 Acute monoblastic/monocytic leukemia, in relapse: Secondary | ICD-10-CM | POA: Diagnosis not present

## 2016-09-24 DIAGNOSIS — Z5111 Encounter for antineoplastic chemotherapy: Secondary | ICD-10-CM | POA: Diagnosis not present

## 2016-09-24 LAB — CMP (CANCER CENTER ONLY)
ALT(SGPT): 28 U/L (ref 10–47)
AST: 44 U/L — AB (ref 11–38)
Albumin: 3.9 g/dL (ref 3.3–5.5)
Alkaline Phosphatase: 89 U/L — ABNORMAL HIGH (ref 26–84)
BUN: 19 mg/dL (ref 7–22)
CALCIUM: 10.2 mg/dL (ref 8.0–10.3)
CO2: 28 meq/L (ref 18–33)
Chloride: 101 mEq/L (ref 98–108)
Creat: 1.6 mg/dl — ABNORMAL HIGH (ref 0.6–1.2)
GLUCOSE: 165 mg/dL — AB (ref 73–118)
POTASSIUM: 3.5 meq/L (ref 3.3–4.7)
Sodium: 136 mEq/L (ref 128–145)
Total Bilirubin: 1.1 mg/dl (ref 0.20–1.60)
Total Protein: 6.2 g/dL — ABNORMAL LOW (ref 6.4–8.1)

## 2016-09-24 LAB — PREPARE RBC (CROSSMATCH)

## 2016-09-24 LAB — CBC WITH DIFFERENTIAL (CANCER CENTER ONLY)
HEMATOCRIT: 25.5 % — AB (ref 34.8–46.6)
HGB: 8.3 g/dL — ABNORMAL LOW (ref 11.6–15.9)
MCH: 33.5 pg (ref 26.0–34.0)
MCHC: 32.5 g/dL (ref 32.0–36.0)
MCV: 103 fL — AB (ref 81–101)
PLATELETS: 29 10*3/uL — AB (ref 145–400)
RBC: 2.48 10*6/uL — ABNORMAL LOW (ref 3.70–5.32)
RDW: 13.5 % (ref 11.1–15.7)

## 2016-09-24 LAB — MANUAL DIFFERENTIAL (CHCC SATELLITE)
ALC: 6.5 10*3/uL — AB (ref 0.6–2.2)
ANC (CHCC HP manual diff): 8.3 10*3/uL — ABNORMAL HIGH (ref 1.5–6.7)
BASO: 1 % (ref 0–2)
BLASTS: 8 % — AB (ref 0–0)
Band Neutrophils: 7 % (ref 0–10)
Eos: 2 % (ref 0–7)
LYMPH: 38 % (ref 14–48)
METAMYELOCYTES PCT: 6 % — AB (ref 0–0)
MONO: 3 % (ref 0–13)
Myelocytes: 2 % — ABNORMAL HIGH (ref 0–0)
NRBC: 9 % — AB (ref 0–0)
PLT EST ~~LOC~~: DECREASED
SEG: 33 % — AB (ref 40–75)

## 2016-09-24 MED ORDER — PROCHLORPERAZINE MALEATE 10 MG PO TABS
10.0000 mg | ORAL_TABLET | Freq: Once | ORAL | Status: AC
  Administered 2016-09-24: 10 mg via ORAL

## 2016-09-24 MED ORDER — DECITABINE CHEMO INJECTION 50 MG
20.0000 mg/m2 | Freq: Once | INTRAVENOUS | Status: AC
Start: 2016-09-24 — End: 2016-09-24
  Administered 2016-09-24: 35 mg via INTRAVENOUS
  Filled 2016-09-24: qty 7

## 2016-09-24 MED ORDER — SODIUM CHLORIDE 0.9 % IV SOLN
Freq: Once | INTRAVENOUS | Status: AC
  Administered 2016-09-24: 15:00:00 via INTRAVENOUS

## 2016-09-24 MED ORDER — HEPARIN SOD (PORK) LOCK FLUSH 100 UNIT/ML IV SOLN
500.0000 [IU] | Freq: Once | INTRAVENOUS | Status: AC | PRN
  Administered 2016-09-24: 500 [IU]
  Filled 2016-09-24: qty 5

## 2016-09-24 MED ORDER — SODIUM CHLORIDE 0.9% FLUSH
10.0000 mL | INTRAVENOUS | Status: DC | PRN
  Administered 2016-09-24: 10 mL
  Filled 2016-09-24: qty 10

## 2016-09-24 MED ORDER — PROCHLORPERAZINE MALEATE 10 MG PO TABS
ORAL_TABLET | ORAL | Status: AC
  Filled 2016-09-24: qty 1

## 2016-09-24 NOTE — Addendum Note (Signed)
Addended by: Randolm Idol on: 09/24/2016 05:09 PM   Modules accepted: Orders

## 2016-09-25 ENCOUNTER — Ambulatory Visit (HOSPITAL_BASED_OUTPATIENT_CLINIC_OR_DEPARTMENT_OTHER): Payer: Medicare Other

## 2016-09-25 ENCOUNTER — Encounter: Payer: Self-pay | Admitting: Hematology & Oncology

## 2016-09-25 DIAGNOSIS — C9302 Acute monoblastic/monocytic leukemia, in relapse: Secondary | ICD-10-CM | POA: Diagnosis not present

## 2016-09-25 DIAGNOSIS — C9300 Acute monoblastic/monocytic leukemia, not having achieved remission: Secondary | ICD-10-CM

## 2016-09-25 DIAGNOSIS — Z5111 Encounter for antineoplastic chemotherapy: Secondary | ICD-10-CM

## 2016-09-25 MED ORDER — PROCHLORPERAZINE MALEATE 10 MG PO TABS
ORAL_TABLET | ORAL | Status: AC
  Filled 2016-09-25: qty 1

## 2016-09-25 MED ORDER — HEPARIN SOD (PORK) LOCK FLUSH 100 UNIT/ML IV SOLN
500.0000 [IU] | Freq: Once | INTRAVENOUS | Status: AC | PRN
  Administered 2016-09-25: 500 [IU]
  Filled 2016-09-25: qty 5

## 2016-09-25 MED ORDER — SODIUM CHLORIDE 0.9% FLUSH
10.0000 mL | INTRAVENOUS | Status: DC | PRN
  Administered 2016-09-25: 10 mL
  Filled 2016-09-25: qty 10

## 2016-09-25 MED ORDER — SODIUM CHLORIDE 0.9 % IV SOLN
Freq: Once | INTRAVENOUS | Status: AC
  Administered 2016-09-25: 13:00:00 via INTRAVENOUS

## 2016-09-25 MED ORDER — SODIUM CHLORIDE 0.9 % IV SOLN
20.0000 mg/m2 | Freq: Once | INTRAVENOUS | Status: AC
  Administered 2016-09-25: 35 mg via INTRAVENOUS
  Filled 2016-09-25: qty 7

## 2016-09-25 MED ORDER — PROCHLORPERAZINE MALEATE 10 MG PO TABS
10.0000 mg | ORAL_TABLET | Freq: Once | ORAL | Status: AC
  Administered 2016-09-25: 10 mg via ORAL

## 2016-09-25 NOTE — Patient Instructions (Signed)
[facility] Discharge Instructions for Patients Receiving Chemotherapy  Today you received the following chemotherapy agents Dacogen.  To help prevent nausea and vomiting after your treatment, we encourage you to take your nausea medication.   If you develop nausea and vomiting that is not controlled by your nausea medication, call the clinic.   BELOW ARE SYMPTOMS THAT SHOULD BE REPORTED IMMEDIATELY:  *FEVER GREATER THAN 100.5 F  *CHILLS WITH OR WITHOUT FEVER  NAUSEA AND VOMITING THAT IS NOT CONTROLLED WITH YOUR NAUSEA MEDICATION  *UNUSUAL SHORTNESS OF BREATH  *UNUSUAL BRUISING OR BLEEDING  TENDERNESS IN MOUTH AND THROAT WITH OR WITHOUT PRESENCE OF ULCERS  *URINARY PROBLEMS  *BOWEL PROBLEMS  UNUSUAL RASH Items with * indicate a potential emergency and should be followed up as soon as possible.  Feel free to call the clinic you have any questions or concerns. The clinic phone number is [phone].  Please show the [place] at check-in to the Emergency Department and triage nurse.

## 2016-09-26 ENCOUNTER — Ambulatory Visit: Payer: Medicare Other

## 2016-09-26 ENCOUNTER — Encounter: Payer: Self-pay | Admitting: Hematology & Oncology

## 2016-09-26 ENCOUNTER — Other Ambulatory Visit (HOSPITAL_BASED_OUTPATIENT_CLINIC_OR_DEPARTMENT_OTHER): Payer: Medicare Other

## 2016-09-26 ENCOUNTER — Other Ambulatory Visit: Payer: Self-pay | Admitting: Family

## 2016-09-26 ENCOUNTER — Ambulatory Visit (HOSPITAL_BASED_OUTPATIENT_CLINIC_OR_DEPARTMENT_OTHER): Payer: Medicare Other

## 2016-09-26 VITALS — BP 125/57 | HR 78 | Temp 97.8°F | Resp 20

## 2016-09-26 DIAGNOSIS — C9300 Acute monoblastic/monocytic leukemia, not having achieved remission: Secondary | ICD-10-CM

## 2016-09-26 DIAGNOSIS — C9302 Acute monoblastic/monocytic leukemia, in relapse: Secondary | ICD-10-CM | POA: Diagnosis not present

## 2016-09-26 DIAGNOSIS — Z5111 Encounter for antineoplastic chemotherapy: Secondary | ICD-10-CM | POA: Diagnosis not present

## 2016-09-26 DIAGNOSIS — C9202 Acute myeloblastic leukemia, in relapse: Secondary | ICD-10-CM

## 2016-09-26 DIAGNOSIS — D696 Thrombocytopenia, unspecified: Secondary | ICD-10-CM

## 2016-09-26 LAB — MANUAL DIFFERENTIAL (CHCC SATELLITE)
ALC: 5.5 10*3/uL — ABNORMAL HIGH (ref 0.6–2.2)
ANC (CHCC HP manual diff): 10.6 10*3/uL — ABNORMAL HIGH (ref 1.5–6.7)
BLASTS: 9 % — AB (ref 0–0)
Band Neutrophils: 8 % (ref 0–10)
EOS: 1 % (ref 0–7)
LYMPH: 30 % (ref 14–48)
METAMYELOCYTES PCT: 4 % — AB (ref 0–0)
MONO: 2 % (ref 0–13)
Myelocytes: 5 % — ABNORMAL HIGH (ref 0–0)
PLT EST ~~LOC~~: DECREASED
SEG: 41 % (ref 40–75)
nRBC: 4 % — ABNORMAL HIGH (ref 0–0)

## 2016-09-26 LAB — CBC WITH DIFFERENTIAL (CANCER CENTER ONLY)
HEMATOCRIT: 24.2 % — AB (ref 34.8–46.6)
HEMOGLOBIN: 7.7 g/dL — AB (ref 11.6–15.9)
MCH: 33 pg (ref 26.0–34.0)
MCHC: 31.8 g/dL — ABNORMAL LOW (ref 32.0–36.0)
MCV: 104 fL — AB (ref 81–101)
Platelets: 24 10*3/uL — ABNORMAL LOW (ref 145–400)
RBC: 2.33 10*6/uL — ABNORMAL LOW (ref 3.70–5.32)
RDW: 14 % (ref 11.1–15.7)
WBC: 18.3 10*3/uL — AB (ref 3.9–10.0)

## 2016-09-26 LAB — CMP (CANCER CENTER ONLY)
ALK PHOS: 123 U/L — AB (ref 26–84)
ALT: 46 U/L (ref 10–47)
AST: 62 U/L — ABNORMAL HIGH (ref 11–38)
Albumin: 3.8 g/dL (ref 3.3–5.5)
BUN: 23 mg/dL — AB (ref 7–22)
CALCIUM: 10.5 mg/dL — AB (ref 8.0–10.3)
CHLORIDE: 104 meq/L (ref 98–108)
CO2: 26 mEq/L (ref 18–33)
Creat: 1.6 mg/dl — ABNORMAL HIGH (ref 0.6–1.2)
GLUCOSE: 170 mg/dL — AB (ref 73–118)
POTASSIUM: 3.7 meq/L (ref 3.3–4.7)
Sodium: 136 mEq/L (ref 128–145)
TOTAL PROTEIN: 6.3 g/dL — AB (ref 6.4–8.1)
Total Bilirubin: 0.9 mg/dl (ref 0.20–1.60)

## 2016-09-26 MED ORDER — SODIUM CHLORIDE 0.9 % IV SOLN
20.0000 mg/m2 | Freq: Once | INTRAVENOUS | Status: AC
  Administered 2016-09-26: 35 mg via INTRAVENOUS
  Filled 2016-09-26: qty 7

## 2016-09-26 MED ORDER — PROCHLORPERAZINE MALEATE 10 MG PO TABS
ORAL_TABLET | ORAL | Status: AC
  Filled 2016-09-26: qty 1

## 2016-09-26 MED ORDER — ACETAMINOPHEN 325 MG PO TABS
ORAL_TABLET | ORAL | Status: AC
  Filled 2016-09-26: qty 2

## 2016-09-26 MED ORDER — SODIUM CHLORIDE 0.9% FLUSH
10.0000 mL | INTRAVENOUS | Status: DC | PRN
  Administered 2016-09-26: 10 mL
  Filled 2016-09-26: qty 10

## 2016-09-26 MED ORDER — FUROSEMIDE 10 MG/ML IJ SOLN: 20.0000 mg | Freq: Once | INTRAMUSCULAR | Status: DC

## 2016-09-26 MED ORDER — SODIUM CHLORIDE 0.9 % IV SOLN
Freq: Once | INTRAVENOUS | Status: AC
  Administered 2016-09-26: 08:00:00 via INTRAVENOUS

## 2016-09-26 MED ORDER — DIPHENHYDRAMINE HCL 25 MG PO CAPS
ORAL_CAPSULE | ORAL | Status: AC
  Filled 2016-09-26: qty 1

## 2016-09-26 MED ORDER — SODIUM CHLORIDE 0.9 % IV SOLN
250.0000 mL | Freq: Once | INTRAVENOUS | Status: AC
  Administered 2016-09-26: 250 mL via INTRAVENOUS

## 2016-09-26 MED ORDER — ACETAMINOPHEN 325 MG PO TABS
650.0000 mg | ORAL_TABLET | Freq: Once | ORAL | Status: AC
  Administered 2016-09-26: 650 mg via ORAL

## 2016-09-26 MED ORDER — DIPHENHYDRAMINE HCL 25 MG PO CAPS
25.0000 mg | ORAL_CAPSULE | Freq: Once | ORAL | Status: AC
  Administered 2016-09-26: 25 mg via ORAL

## 2016-09-26 MED ORDER — HEPARIN SOD (PORK) LOCK FLUSH 100 UNIT/ML IV SOLN
500.0000 [IU] | Freq: Once | INTRAVENOUS | Status: AC | PRN
  Administered 2016-09-26: 500 [IU]
  Filled 2016-09-26: qty 5

## 2016-09-26 MED ORDER — PROCHLORPERAZINE MALEATE 10 MG PO TABS
10.0000 mg | ORAL_TABLET | Freq: Once | ORAL | Status: AC
  Administered 2016-09-26: 10 mg via ORAL

## 2016-09-26 NOTE — Patient Instructions (Signed)
Blood Transfusion A blood transfusion is a procedure in which you are given blood through an IV tube. You may need this procedure because of:  Illness.  Surgery.  Injury. The blood may come from someone else (a donor). You may also be able to donate blood for yourself (autologous blood donation). The blood given in a transfusion is made up of different types of cells. You may get:  Red blood cells. These carry oxygen to the cells in the body.  White blood cells. These help you fight infections.  Platelets. These help your blood to clot.  Plasma. This is the liquid part of your blood. It helps with fluid imbalances. If you have a clotting disorder, you may also get other types of blood products. What happens before the procedure?  You will have a blood test to find out your blood type. The test also finds out what type of blood your body will accept and matches it to the donor type.  If you are going to have a planned surgery, you may be able to donate your own blood. This may be done in case you need a transfusion.  If you have had an allergic reaction to a transfusion in the past, you may be given medicine to help prevent a reaction. This medicine may be given to you by mouth or through an IV.  You will have your temperature, blood pressure, and pulse checked.  Follow instructions from your doctor about what you cannot eat or drink.  Ask your doctor about:  Changing or stopping your regular medicines. This is important if you take diabetes medicines or blood thinners.  Taking medicines such as aspirin and ibuprofen. These medicines can thin your blood. Do not take these medicines before your procedure if your doctor tells you not to. What happens during the procedure?  An IV tube will be put into one of your veins.  The bag of donated blood will be attached to your IV tube. Then, the blood will enter through your vein.  Your temperature, blood pressure, and pulse will be  checked regularly during the procedure. This is done to find early signs of a transfusion reaction.  If you have any signs or symptoms of a reaction, your transfusion will be stopped. You may also be given medicine.  When the transfusion is done, your IV tube will be taken out.  Pressure may be applied to the IV site for a few minutes.  A bandage (dressing) will be put on the IV site. The procedure may vary among doctors and hospitals. What happens after the procedure?  Your temperature, blood pressure, heart rate, breathing rate, and blood oxygen level will be checked often.  Your blood may be tested to see how you are responding to the transfusion.  You may be warmed with fluids or blankets. This is done to keep the temperature of your body normal. Summary  A blood transfusion is a procedure in which you are given blood through an IV tube.  The blood may come from someone else (a donor). You may also be able to donate blood for yourself.  If you have had an allergic reaction to a transfusion in the past, you may be given medicine to help prevent a reaction. This medicine may be given to you by mouth or through an IV tube.  Your temperature, blood pressure, heart rate, breathing rate, and blood oxygen level will be checked often.  Your blood may be tested to see how  you are responding to the transfusion. This information is not intended to replace advice given to you by your health care provider. Make sure you discuss any questions you have with your health care provider. Document Released: 08/10/2008 Document Revised: 01/06/2016 Document Reviewed: 01/06/2016 Elsevier Interactive Patient Education  2017 [place] Discharge Instructions for Patients Receiving Chemotherapy  Today you received the following chemotherapy agents Dacogen.  To help prevent nausea and vomiting after your treatment, we encourage you to take your nausea medication.   If you  develop nausea and vomiting that is not controlled by your nausea medication, call the clinic.   BELOW ARE SYMPTOMS THAT SHOULD BE REPORTED IMMEDIATELY:  *FEVER GREATER THAN 100.5 F  *CHILLS WITH OR WITHOUT FEVER  NAUSEA AND VOMITING THAT IS NOT CONTROLLED WITH YOUR NAUSEA MEDICATION  *UNUSUAL SHORTNESS OF BREATH  *UNUSUAL BRUISING OR BLEEDING  TENDERNESS IN MOUTH AND THROAT WITH OR WITHOUT PRESENCE OF ULCERS  *URINARY PROBLEMS  *BOWEL PROBLEMS  UNUSUAL RASH Items with * indicate a potential emergency and should be followed up as soon as possible.  Feel free to call the clinic you have any questions or concerns. The clinic phone number is [phone].  Please show the [place] at check-in to the Emergency Department and triage nurse.

## 2016-09-27 ENCOUNTER — Ambulatory Visit (HOSPITAL_BASED_OUTPATIENT_CLINIC_OR_DEPARTMENT_OTHER): Payer: Medicare Other

## 2016-09-27 ENCOUNTER — Other Ambulatory Visit: Payer: Medicare Other

## 2016-09-27 ENCOUNTER — Other Ambulatory Visit: Payer: Self-pay | Admitting: Hematology & Oncology

## 2016-09-27 ENCOUNTER — Ambulatory Visit: Payer: Medicare Other | Admitting: Hematology & Oncology

## 2016-09-27 ENCOUNTER — Ambulatory Visit: Payer: Medicare Other

## 2016-09-27 VITALS — BP 129/76 | HR 88 | Temp 98.2°F | Resp 16

## 2016-09-27 DIAGNOSIS — Z5111 Encounter for antineoplastic chemotherapy: Secondary | ICD-10-CM

## 2016-09-27 DIAGNOSIS — C9302 Acute monoblastic/monocytic leukemia, in relapse: Secondary | ICD-10-CM

## 2016-09-27 DIAGNOSIS — C9300 Acute monoblastic/monocytic leukemia, not having achieved remission: Secondary | ICD-10-CM

## 2016-09-27 LAB — TYPE AND SCREEN
ABO/RH(D): A POS
ANTIBODY SCREEN: NEGATIVE
UNIT DIVISION: 0
Unit division: 0

## 2016-09-27 LAB — BPAM RBC
BLOOD PRODUCT EXPIRATION DATE: 201805172359
Blood Product Expiration Date: 201805172359
ISSUE DATE / TIME: 201805020805
ISSUE DATE / TIME: 201805020805
UNIT TYPE AND RH: 6200
UNIT TYPE AND RH: 6200

## 2016-09-27 MED ORDER — PROCHLORPERAZINE MALEATE 10 MG PO TABS
ORAL_TABLET | ORAL | Status: AC
  Filled 2016-09-27: qty 1

## 2016-09-27 MED ORDER — PROCHLORPERAZINE MALEATE 10 MG PO TABS
10.0000 mg | ORAL_TABLET | Freq: Once | ORAL | Status: AC
  Administered 2016-09-27: 10 mg via ORAL

## 2016-09-27 MED ORDER — SODIUM CHLORIDE 0.9 % IV SOLN
Freq: Once | INTRAVENOUS | Status: AC
  Administered 2016-09-27: 13:00:00 via INTRAVENOUS

## 2016-09-27 MED ORDER — HEPARIN SOD (PORK) LOCK FLUSH 100 UNIT/ML IV SOLN
500.0000 [IU] | Freq: Once | INTRAVENOUS | Status: AC | PRN
  Administered 2016-09-27: 500 [IU]
  Filled 2016-09-27: qty 5

## 2016-09-27 MED ORDER — SODIUM CHLORIDE 0.9 % IV SOLN
20.0000 mg/m2 | Freq: Once | INTRAVENOUS | Status: AC
  Administered 2016-09-27: 35 mg via INTRAVENOUS
  Filled 2016-09-27: qty 7

## 2016-09-27 MED ORDER — SODIUM CHLORIDE 0.9% FLUSH
10.0000 mL | INTRAVENOUS | Status: DC | PRN
  Administered 2016-09-27: 10 mL
  Filled 2016-09-27: qty 10

## 2016-09-28 ENCOUNTER — Other Ambulatory Visit (HOSPITAL_BASED_OUTPATIENT_CLINIC_OR_DEPARTMENT_OTHER): Payer: Medicare Other

## 2016-09-28 ENCOUNTER — Ambulatory Visit (HOSPITAL_BASED_OUTPATIENT_CLINIC_OR_DEPARTMENT_OTHER): Payer: Medicare Other

## 2016-09-28 DIAGNOSIS — Z5111 Encounter for antineoplastic chemotherapy: Secondary | ICD-10-CM

## 2016-09-28 DIAGNOSIS — C9300 Acute monoblastic/monocytic leukemia, not having achieved remission: Secondary | ICD-10-CM

## 2016-09-28 DIAGNOSIS — C9302 Acute monoblastic/monocytic leukemia, in relapse: Secondary | ICD-10-CM

## 2016-09-28 LAB — CBC WITH DIFFERENTIAL (CANCER CENTER ONLY)
HEMATOCRIT: 28.8 % — AB (ref 34.8–46.6)
HGB: 9.6 g/dL — ABNORMAL LOW (ref 11.6–15.9)
MCH: 32.5 pg (ref 26.0–34.0)
MCHC: 33.3 g/dL (ref 32.0–36.0)
MCV: 98 fL (ref 81–101)
Platelets: 17 10*3/uL — ABNORMAL LOW (ref 145–400)
RBC: 2.95 10*6/uL — ABNORMAL LOW (ref 3.70–5.32)
RDW: 16.4 % — AB (ref 11.1–15.7)
WBC: 12.8 10*3/uL — AB (ref 3.9–10.0)

## 2016-09-28 LAB — MANUAL DIFFERENTIAL (CHCC SATELLITE)
ALC: 4 10*3/uL — ABNORMAL HIGH (ref 0.6–2.2)
ANC (CHCC HP manual diff): 7.4 10*3/uL — ABNORMAL HIGH (ref 1.5–6.7)
BAND NEUTROPHILS: 6 % (ref 0–10)
Blasts: 5 % — ABNORMAL HIGH (ref 0–0)
Eos: 4 % (ref 0–7)
LYMPH: 31 % (ref 14–48)
METAMYELOCYTES PCT: 3 % — AB (ref 0–0)
MONO: 2 % (ref 0–13)
MYELOCYTES: 5 % — AB (ref 0–0)
NRBC: 1 % — AB (ref 0–0)
PLT EST ~~LOC~~: DECREASED
SEG: 44 % (ref 40–75)

## 2016-09-28 LAB — CMP (CANCER CENTER ONLY)
ALK PHOS: 118 U/L — AB (ref 26–84)
ALT: 48 U/L — AB (ref 10–47)
AST: 55 U/L — ABNORMAL HIGH (ref 11–38)
Albumin: 3.7 g/dL (ref 3.3–5.5)
BUN, Bld: 20 mg/dL (ref 7–22)
CALCIUM: 10.3 mg/dL (ref 8.0–10.3)
CHLORIDE: 103 meq/L (ref 98–108)
CO2: 27 mEq/L (ref 18–33)
Creat: 1.1 mg/dl (ref 0.6–1.2)
GLUCOSE: 150 mg/dL — AB (ref 73–118)
POTASSIUM: 3.7 meq/L (ref 3.3–4.7)
Sodium: 136 mEq/L (ref 128–145)
Total Bilirubin: 1.2 mg/dl (ref 0.20–1.60)
Total Protein: 5.9 g/dL — ABNORMAL LOW (ref 6.4–8.1)

## 2016-09-28 MED ORDER — PROCHLORPERAZINE MALEATE 10 MG PO TABS
ORAL_TABLET | ORAL | Status: AC
  Filled 2016-09-28: qty 1

## 2016-09-28 MED ORDER — PROCHLORPERAZINE MALEATE 10 MG PO TABS
10.0000 mg | ORAL_TABLET | Freq: Once | ORAL | Status: AC
  Administered 2016-09-28: 10 mg via ORAL

## 2016-09-28 MED ORDER — SODIUM CHLORIDE 0.9% FLUSH
10.0000 mL | INTRAVENOUS | Status: DC | PRN
  Filled 2016-09-28: qty 10

## 2016-09-28 MED ORDER — SODIUM CHLORIDE 0.9 % IV SOLN
Freq: Once | INTRAVENOUS | Status: AC
  Administered 2016-09-28: 12:00:00 via INTRAVENOUS

## 2016-09-28 MED ORDER — HEPARIN SOD (PORK) LOCK FLUSH 100 UNIT/ML IV SOLN
500.0000 [IU] | Freq: Once | INTRAVENOUS | Status: DC | PRN
  Filled 2016-09-28: qty 5

## 2016-09-28 MED ORDER — SODIUM CHLORIDE 0.9 % IV SOLN
20.0000 mg/m2 | Freq: Once | INTRAVENOUS | Status: AC
  Administered 2016-09-28: 35 mg via INTRAVENOUS
  Filled 2016-09-28: qty 7

## 2016-09-28 NOTE — Patient Instructions (Signed)
Decitabine injection for infusion What is this medicine? DECITABINE (dee SYE ta been) is a chemotherapy drug. This medicine reduces the growth of cancer cells. It is used to treat adults with myelodysplastic syndromes. This medicine may be used for other purposes; ask your health care provider or pharmacist if you have questions. COMMON BRAND NAME(S): Dacogen What should I tell my health care provider before I take this medicine? They need to know if you have any of these conditions: -infection (especially a virus infection such as chickenpox, cold sores, or herpes) -kidney disease -liver disease -an unusual or allergic reaction to decitabine, other medicines, foods, dyes, or preservatives -pregnant or trying to get pregnant -breast-feeding How should I use this medicine? This medicine is for infusion into a vein. It is administered in a hospital or clinic by a doctor or health care professional. Talk to your pediatrician regarding the use of this medicine in children. Special care may be needed. Overdosage: If you think you have taken too much of this medicine contact a poison control center or emergency room at once. NOTE: This medicine is only for you. Do not share this medicine with others. What if I miss a dose? It is important not to miss your dose. Call your doctor or health care professional if you are unable to keep an appointment. What may interact with this medicine? -vaccines Talk to your doctor or health care professional before taking any of these medicines: -aspirin -acetaminophen -ibuprofen -ketoprofen -naproxen This list may not describe all possible interactions. Give your health care provider a list of all the medicines, herbs, non-prescription drugs, or dietary supplements you use. Also tell them if you smoke, drink alcohol, or use illegal drugs. Some items may interact with your medicine. What should I watch for while using this medicine? Visit your doctor for  checks on your progress. This drug may make you feel generally unwell. This is not uncommon, as chemotherapy can affect healthy cells as well as cancer cells. Report any side effects. Continue your course of treatment even though you feel ill unless your doctor tells you to stop. In some cases, you may be given additional medicines to help with side effects. Follow all directions for their use. Call your doctor or health care professional for advice if you get a fever, chills or sore throat, or other symptoms of a cold or flu. Do not treat yourself. This drug decreases your body's ability to fight infections. Try to avoid being around people who are sick. This medicine may increase your risk to bruise or bleed. Call your doctor or health care professional if you notice any unusual bleeding. Do not become pregnant while taking this medicine or for at least 1 month after stopping it. Women should inform their doctor if they wish to become pregnant or think they might be pregnant. Men should not father a child while taking this medicine and for at least 2 months after stopping it. There is a potential for serious side effects to an unborn child. Talk to your health care professional or pharmacist for more information. Do not breast-feed an infant while taking this medicine. What side effects may I notice from receiving this medicine? Side effects that you should report to your doctor or health care professional as soon as possible: -low blood counts - this medicine may decrease the number of white blood cells, red blood cells and platelets. You may be at increased risk for infections and bleeding. -signs of infection - fever or   chills, cough, sore throat, pain or difficulty passing urine -signs of decreased platelets or bleeding - bruising, pinpoint red spots on the skin, black, tarry stools, blood in the urine -signs of decreased red blood cells - unusual weakness or tiredness, fainting spells,  lightheadedness -increased blood sugar Side effects that usually do not require medical attention (report to your doctor or health care professional if they continue or are bothersome): -constipation -diarrhea -headache -loss of appetite -nausea, vomiting -skin rash, itching -stomach pain -water retention -weak or tired This list may not describe all possible side effects. Call your doctor for medical advice about side effects. You may report side effects to FDA at 1-800-FDA-1088. Where should I keep my medicine? This drug is given in a hospital or clinic and will not be stored at home. NOTE: This sheet is a summary. It may not cover all possible information. If you have questions about this medicine, talk to your doctor, pharmacist, or health care provider.  2018 Elsevier/Gold Standard (2015-06-16 15:52:57)  

## 2016-10-01 ENCOUNTER — Telehealth: Payer: Self-pay | Admitting: Family Medicine

## 2016-10-01 ENCOUNTER — Ambulatory Visit (HOSPITAL_BASED_OUTPATIENT_CLINIC_OR_DEPARTMENT_OTHER): Payer: Medicare Other

## 2016-10-01 ENCOUNTER — Other Ambulatory Visit (HOSPITAL_BASED_OUTPATIENT_CLINIC_OR_DEPARTMENT_OTHER): Payer: Medicare Other

## 2016-10-01 VITALS — BP 110/44 | HR 88 | Temp 98.1°F | Resp 16

## 2016-10-01 DIAGNOSIS — C9300 Acute monoblastic/monocytic leukemia, not having achieved remission: Secondary | ICD-10-CM

## 2016-10-01 DIAGNOSIS — Z5111 Encounter for antineoplastic chemotherapy: Secondary | ICD-10-CM

## 2016-10-01 DIAGNOSIS — C9302 Acute monoblastic/monocytic leukemia, in relapse: Secondary | ICD-10-CM | POA: Diagnosis not present

## 2016-10-01 LAB — CBC WITH DIFFERENTIAL (CANCER CENTER ONLY)
BASO#: 0.1 10*3/uL (ref 0.0–0.2)
BASO%: 1.8 % (ref 0.0–2.0)
EOS%: 2.5 % (ref 0.0–7.0)
Eosinophils Absolute: 0.2 10*3/uL (ref 0.0–0.5)
HEMATOCRIT: 24.2 % — AB (ref 34.8–46.6)
HGB: 7.9 g/dL — ABNORMAL LOW (ref 11.6–15.9)
LYMPH#: 2.6 10*3/uL (ref 0.9–3.3)
LYMPH%: 36.2 % (ref 14.0–48.0)
MCH: 32.4 pg (ref 26.0–34.0)
MCHC: 32.6 g/dL (ref 32.0–36.0)
MCV: 99 fL (ref 81–101)
MONO#: 0.2 10*3/uL (ref 0.1–0.9)
MONO%: 2.8 % (ref 0.0–13.0)
NEUT#: 4 10*3/uL (ref 1.5–6.5)
NEUT%: 56.7 % (ref 39.6–80.0)
PLATELETS: 39 10*3/uL — AB (ref 145–400)
RBC: 2.44 10*6/uL — AB (ref 3.70–5.32)
RDW: 15.1 % (ref 11.1–15.7)
WBC: 7.1 10*3/uL (ref 3.9–10.0)

## 2016-10-01 LAB — CMP (CANCER CENTER ONLY)
ALK PHOS: 104 U/L — AB (ref 26–84)
ALT: 31 U/L (ref 10–47)
AST: 35 U/L (ref 11–38)
Albumin: 3.8 g/dL (ref 3.3–5.5)
BILIRUBIN TOTAL: 1.4 mg/dL (ref 0.20–1.60)
BUN: 31 mg/dL — AB (ref 7–22)
CALCIUM: 10.1 mg/dL (ref 8.0–10.3)
CO2: 28 mEq/L (ref 18–33)
Chloride: 106 mEq/L (ref 98–108)
Creat: 1.3 mg/dl — ABNORMAL HIGH (ref 0.6–1.2)
GLUCOSE: 144 mg/dL — AB (ref 73–118)
POTASSIUM: 3.8 meq/L (ref 3.3–4.7)
Sodium: 137 mEq/L (ref 128–145)
TOTAL PROTEIN: 6.1 g/dL — AB (ref 6.4–8.1)

## 2016-10-01 LAB — TECHNOLOGIST REVIEW CHCC SATELLITE

## 2016-10-01 MED ORDER — SODIUM CHLORIDE 0.9% FLUSH
10.0000 mL | INTRAVENOUS | Status: DC | PRN
  Administered 2016-10-01: 10 mL
  Filled 2016-10-01: qty 10

## 2016-10-01 MED ORDER — SODIUM CHLORIDE 0.9 % IV SOLN
Freq: Once | INTRAVENOUS | Status: AC
  Administered 2016-10-01: 15:00:00 via INTRAVENOUS

## 2016-10-01 MED ORDER — DECITABINE CHEMO INJECTION 50 MG
20.0000 mg/m2 | Freq: Once | INTRAVENOUS | Status: AC
  Administered 2016-10-01: 35 mg via INTRAVENOUS
  Filled 2016-10-01: qty 7

## 2016-10-01 MED ORDER — PROCHLORPERAZINE MALEATE 10 MG PO TABS
10.0000 mg | ORAL_TABLET | Freq: Once | ORAL | Status: AC
  Administered 2016-10-01: 10 mg via ORAL

## 2016-10-01 MED ORDER — PROCHLORPERAZINE MALEATE 10 MG PO TABS
ORAL_TABLET | ORAL | Status: AC
  Filled 2016-10-01: qty 1

## 2016-10-01 MED ORDER — HEPARIN SOD (PORK) LOCK FLUSH 100 UNIT/ML IV SOLN
500.0000 [IU] | Freq: Once | INTRAVENOUS | Status: AC | PRN
  Administered 2016-10-01: 500 [IU]
  Filled 2016-10-01: qty 5

## 2016-10-01 NOTE — Patient Instructions (Signed)
[facility] Discharge Instructions for Patients Receiving Chemotherapy  Today you received the following chemotherapy agents decitabine  To help prevent nausea and vomiting after your treatment, we encourage you to take your nausea medication    If you develop nausea and vomiting that is not controlled by your nausea medication, call the clinic.   BELOW ARE SYMPTOMS THAT SHOULD BE REPORTED IMMEDIATELY:  *FEVER GREATER THAN 100.5 F  *CHILLS WITH OR WITHOUT FEVER  NAUSEA AND VOMITING THAT IS NOT CONTROLLED WITH YOUR NAUSEA MEDICATION  *UNUSUAL SHORTNESS OF BREATH  *UNUSUAL BRUISING OR BLEEDING  TENDERNESS IN MOUTH AND THROAT WITH OR WITHOUT PRESENCE OF ULCERS  *URINARY PROBLEMS  *BOWEL PROBLEMS  UNUSUAL RASH Items with * indicate a potential emergency and should be followed up as soon as possible.  Feel free to call the clinic you have any questions or concerns. The clinic phone number is [phone].  Please show the [facility] at check-in to the Emergency Department and triage nurse.

## 2016-10-01 NOTE — Progress Notes (Signed)
OK to treat with lab results today per Dr. Ennever 

## 2016-10-01 NOTE — Progress Notes (Signed)
Labs reviewed with Dr. Marin Olp.  Ok to treat per Dr. Marin Olp

## 2016-10-01 NOTE — Telephone Encounter (Signed)
Requesting Paxil 20mg -Take 1 tablet by mouth daily. Last refill:We have never filled this prescription for the pt. Last OV:07/11/16 Please advise.//AB/CMA

## 2016-10-01 NOTE — Telephone Encounter (Signed)
Relation to pt: self Call back number:[phone] Pharmacy: [facility], [place]. (Mail Delivery option)   Reason for call:   Patient requesting PARoxetine (PAXIL) 20 MG tablet refill, patient states when you send Rx to pharmacy please indicate mail to patient home.

## 2016-10-02 ENCOUNTER — Ambulatory Visit (HOSPITAL_BASED_OUTPATIENT_CLINIC_OR_DEPARTMENT_OTHER): Payer: Medicare Other

## 2016-10-02 VITALS — BP 108/35 | HR 79 | Temp 98.3°F | Resp 18

## 2016-10-02 DIAGNOSIS — C9302 Acute monoblastic/monocytic leukemia, in relapse: Secondary | ICD-10-CM | POA: Diagnosis not present

## 2016-10-02 DIAGNOSIS — Z5111 Encounter for antineoplastic chemotherapy: Secondary | ICD-10-CM

## 2016-10-02 DIAGNOSIS — C9300 Acute monoblastic/monocytic leukemia, not having achieved remission: Secondary | ICD-10-CM

## 2016-10-02 MED ORDER — PROCHLORPERAZINE MALEATE 10 MG PO TABS
10.0000 mg | ORAL_TABLET | Freq: Once | ORAL | Status: AC
  Administered 2016-10-02: 10 mg via ORAL

## 2016-10-02 MED ORDER — HEPARIN SOD (PORK) LOCK FLUSH 100 UNIT/ML IV SOLN
500.0000 [IU] | Freq: Once | INTRAVENOUS | Status: AC | PRN
Start: 2016-10-02 — End: 2016-10-02
  Administered 2016-10-02: 500 [IU]
  Filled 2016-10-02: qty 5

## 2016-10-02 MED ORDER — SODIUM CHLORIDE 0.9% FLUSH
10.0000 mL | INTRAVENOUS | Status: DC | PRN
  Administered 2016-10-02: 10 mL
  Filled 2016-10-02: qty 10

## 2016-10-02 MED ORDER — PROCHLORPERAZINE MALEATE 10 MG PO TABS
ORAL_TABLET | ORAL | Status: AC
  Filled 2016-10-02: qty 1

## 2016-10-02 MED ORDER — SODIUM CHLORIDE 0.9 % IV SOLN
Freq: Once | INTRAVENOUS | Status: AC
  Administered 2016-10-02: 10:00:00 via INTRAVENOUS

## 2016-10-02 MED ORDER — SODIUM CHLORIDE 0.9 % IV SOLN
20.0000 mg/m2 | Freq: Once | INTRAVENOUS | Status: AC
  Administered 2016-10-02: 35 mg via INTRAVENOUS
  Filled 2016-10-02: qty 7

## 2016-10-02 NOTE — Patient Instructions (Signed)
[facility] Discharge Instructions for Patients Receiving Chemotherapy  Today you received the following chemotherapy agents Decitabine  To help prevent nausea and vomiting after your treatment, we encourage you to take your nausea medication    If you develop nausea and vomiting that is not controlled by your nausea medication, call the clinic.   BELOW ARE SYMPTOMS THAT SHOULD BE REPORTED IMMEDIATELY:  *FEVER GREATER THAN 100.5 F  *CHILLS WITH OR WITHOUT FEVER  NAUSEA AND VOMITING THAT IS NOT CONTROLLED WITH YOUR NAUSEA MEDICATION  *UNUSUAL SHORTNESS OF BREATH  *UNUSUAL BRUISING OR BLEEDING  TENDERNESS IN MOUTH AND THROAT WITH OR WITHOUT PRESENCE OF ULCERS  *URINARY PROBLEMS  *BOWEL PROBLEMS  UNUSUAL RASH Items with * indicate a potential emergency and should be followed up as soon as possible.  Feel free to call the clinic you have any questions or concerns. The clinic phone number is [phone].  Please show the [place] at check-in to the Emergency Department and triage nurse.

## 2016-10-02 NOTE — Telephone Encounter (Signed)
OK to do. 90 w 1 refill please. TY.

## 2016-10-03 ENCOUNTER — Ambulatory Visit (HOSPITAL_BASED_OUTPATIENT_CLINIC_OR_DEPARTMENT_OTHER): Payer: Medicare Other

## 2016-10-03 ENCOUNTER — Other Ambulatory Visit (HOSPITAL_BASED_OUTPATIENT_CLINIC_OR_DEPARTMENT_OTHER): Payer: Medicare Other

## 2016-10-03 ENCOUNTER — Other Ambulatory Visit: Payer: Medicare Other

## 2016-10-03 ENCOUNTER — Encounter: Payer: Self-pay | Admitting: *Deleted

## 2016-10-03 ENCOUNTER — Other Ambulatory Visit: Payer: Self-pay | Admitting: *Deleted

## 2016-10-03 ENCOUNTER — Ambulatory Visit (HOSPITAL_COMMUNITY)
Admission: RE | Admit: 2016-10-03 | Discharge: 2016-10-03 | Disposition: A | Discharge status: Home / Self Care | Payer: Medicare Other | Source: Ambulatory Visit | Attending: Hematology & Oncology | Admitting: Hematology & Oncology

## 2016-10-03 ENCOUNTER — Other Ambulatory Visit: Payer: Self-pay | Admitting: Family

## 2016-10-03 VITALS — BP 119/50 | HR 83 | Temp 97.9°F | Resp 18

## 2016-10-03 DIAGNOSIS — C9302 Acute monoblastic/monocytic leukemia, in relapse: Secondary | ICD-10-CM | POA: Diagnosis not present

## 2016-10-03 DIAGNOSIS — D696 Thrombocytopenia, unspecified: Secondary | ICD-10-CM | POA: Diagnosis present

## 2016-10-03 DIAGNOSIS — C9300 Acute monoblastic/monocytic leukemia, not having achieved remission: Secondary | ICD-10-CM | POA: Insufficient documentation

## 2016-10-03 DIAGNOSIS — Z5111 Encounter for antineoplastic chemotherapy: Secondary | ICD-10-CM | POA: Diagnosis not present

## 2016-10-03 DIAGNOSIS — D630 Anemia in neoplastic disease: Secondary | ICD-10-CM

## 2016-10-03 LAB — CBC WITH DIFFERENTIAL (CANCER CENTER ONLY)
BASO#: 0.1 10*3/uL (ref 0.0–0.2)
BASO%: 1.5 % (ref 0.0–2.0)
EOS%: 2.5 % (ref 0.0–7.0)
Eosinophils Absolute: 0.1 10*3/uL (ref 0.0–0.5)
HCT: 22.6 % — ABNORMAL LOW (ref 34.8–46.6)
HGB: 7.4 g/dL — ABNORMAL LOW (ref 11.6–15.9)
LYMPH#: 1.9 10*3/uL (ref 0.9–3.3)
LYMPH%: 39.9 % (ref 14.0–48.0)
MCH: 32.2 pg (ref 26.0–34.0)
MCHC: 32.7 g/dL (ref 32.0–36.0)
MCV: 98 fL (ref 81–101)
MONO#: 0.1 10*3/uL (ref 0.1–0.9)
MONO%: 2.3 % (ref 0.0–13.0)
NEUT%: 53.8 % (ref 39.6–80.0)
NEUTROS ABS: 2.6 10*3/uL (ref 1.5–6.5)
PLATELETS: 21 10*3/uL — AB (ref 145–400)
RBC: 2.3 10*6/uL — AB (ref 3.70–5.32)
RDW: 14.6 % (ref 11.1–15.7)
WBC: 4.8 10*3/uL (ref 3.9–10.0)

## 2016-10-03 LAB — CMP (CANCER CENTER ONLY)
ALK PHOS: 86 U/L — AB (ref 26–84)
ALT: 29 U/L (ref 10–47)
AST: 34 U/L (ref 11–38)
Albumin: 3.9 g/dL (ref 3.3–5.5)
BILIRUBIN TOTAL: 1.3 mg/dL (ref 0.20–1.60)
BUN: 25 mg/dL — AB (ref 7–22)
CO2: 28 mEq/L (ref 18–33)
CREATININE: 1.1 mg/dL (ref 0.6–1.2)
Calcium: 9.9 mg/dL (ref 8.0–10.3)
Chloride: 104 mEq/L (ref 98–108)
Glucose, Bld: 114 mg/dL (ref 73–118)
Potassium: 3.8 mEq/L (ref 3.3–4.7)
SODIUM: 135 meq/L (ref 128–145)
TOTAL PROTEIN: 6.2 g/dL — AB (ref 6.4–8.1)

## 2016-10-03 LAB — TECHNOLOGIST REVIEW CHCC SATELLITE

## 2016-10-03 MED ORDER — PROCHLORPERAZINE MALEATE 10 MG PO TABS: 10.0000 mg | ORAL_TABLET | Freq: Four times a day (QID) | ORAL | 0 refills | Status: AC | PRN

## 2016-10-03 MED ORDER — SODIUM CHLORIDE 0.9 % IV SOLN
Freq: Once | INTRAVENOUS | Status: AC
  Administered 2016-10-03: 12:00:00 via INTRAVENOUS

## 2016-10-03 MED ORDER — PROCHLORPERAZINE MALEATE 10 MG PO TABS
10.0000 mg | ORAL_TABLET | Freq: Once | ORAL | Status: AC
  Administered 2016-10-03: 10 mg via ORAL

## 2016-10-03 MED ORDER — HEPARIN SOD (PORK) LOCK FLUSH 100 UNIT/ML IV SOLN
500.0000 [IU] | Freq: Once | INTRAVENOUS | Status: AC | PRN
  Administered 2016-10-03: 500 [IU]
  Filled 2016-10-03: qty 5

## 2016-10-03 MED ORDER — SODIUM CHLORIDE 0.9 % IV SOLN
20.0000 mg/m2 | Freq: Once | INTRAVENOUS | Status: AC
  Administered 2016-10-03: 35 mg via INTRAVENOUS
  Filled 2016-10-03: qty 7

## 2016-10-03 MED ORDER — SODIUM CHLORIDE 0.9% FLUSH
10.0000 mL | INTRAVENOUS | Status: DC | PRN
  Administered 2016-10-03: 10 mL
  Filled 2016-10-03: qty 10

## 2016-10-03 MED ORDER — PROCHLORPERAZINE MALEATE 10 MG PO TABS
ORAL_TABLET | ORAL | Status: AC
Start: 2016-10-03 — End: 2016-10-03
  Filled 2016-10-03: qty 1

## 2016-10-03 MED ORDER — PAROXETINE HCL 20 MG PO TABS: 20.0000 mg | ORAL_TABLET | Freq: Every day | ORAL | 1 refills | Status: AC

## 2016-10-03 NOTE — Patient Instructions (Signed)
Nausea, Adult  Nausea is the feeling of an upset stomach or having to vomit. Nausea on its own is not usually a serious concern, but it may be an early sign of a more serious medical problem. As nausea gets worse, it can lead to vomiting. If vomiting develops, or if you are not able to drink enough fluids, you are at risk of becoming dehydrated. Dehydration can make you tired and thirsty, cause you to have a dry mouth, and decrease how often you urinate. Older adults and people with other diseases or a weak immune system are at higher risk for dehydration. The main goals of treating your nausea are:  · To limit repeated nausea episodes.  · To prevent vomiting and dehydration.    Follow these instructions at home:  Follow instructions from your health care provider about how to care for yourself at home.  Eating and drinking   Follow these recommendations as told by your health care provider:  · Take an oral rehydration solution (ORS). This is a drink that is sold at pharmacies and retail stores.  · Drink clear fluids in small amounts as you are able. Clear fluids include water, ice chips, diluted fruit juice, and low-calorie sports drinks.  · Eat bland, easy-to-digest foods in small amounts as you are able. These foods include bananas, applesauce, rice, lean meats, toast, and crackers.  · Avoid drinking fluids that contain a lot of sugar or caffeine, such as energy drinks, sports drinks, and soda.  · Avoid alcohol.  · Avoid spicy or fatty foods.    General instructions   · Drink enough fluid to keep your urine clear or pale yellow.  · Wash your hands often. If soap and water are not available, use hand sanitizer.  · Make sure that all people in your household wash their hands well and often.  · Rest at home while you recover.  · Take over-the-counter and prescription medicines only as told by your health care provider.  · Breathe slowly and deeply when you feel nauseous.  · Watch your condition for any  changes.  · Keep all follow-up visits as told by your health care provider. This is important.  Contact a health care provider if:  · You have a headache.  · You have new symptoms.  · Your nausea gets worse.  · You have a fever.  · You feel light-headed or dizzy.  · You vomit.  · You cannot keep fluids down.  Get help right away if:  · You have pain in your chest, neck, arm, or jaw.  · You feel extremely weak or you faint.  · You have vomit that is bright red or looks like coffee grounds.  · You have bloody or black stools or stools that look like tar.  · You have a severe headache, a stiff neck, or both.  · You have severe pain, cramping, or bloating in your abdomen.  · You have a rash.  · You have difficulty breathing or are breathing very quickly.  · Your heart is beating very quickly.  · Your skin feels cold and clammy.  · You feel confused.  · You have pain when you urinate.  · You have signs of dehydration, such as:  ? Dark urine, very little, or no urine.  ? Cracked lips.  ? Dry mouth.  ? Sunken eyes.  ? Sleepiness.  ? Weakness.  These symptoms may represent a serious problem that is an emergency. Do   not wait to see if the symptoms will go away. Get medical help right away. Call your local emergency services (911 in the [place] Do not drive yourself to the hospital.  This information is not intended to replace advice given to you by your health care provider. Make sure you discuss any questions you have with your health care provider.  Document Released: 06/21/2004 Document Revised: 10/17/2015 Document Reviewed: 01/18/2015  Elsevier Interactive Patient Education © 2017 [employer]

## 2016-10-03 NOTE — Telephone Encounter (Signed)
Rx sent to the pharmacy by e-script.//AB/CMA 

## 2016-10-03 NOTE — Progress Notes (Signed)
Patient Hgb 7.4.  Dr. Marin Olp aware.  Ordered 2 u prbc to be transfused on 10/04/16

## 2016-10-04 ENCOUNTER — Ambulatory Visit (HOSPITAL_BASED_OUTPATIENT_CLINIC_OR_DEPARTMENT_OTHER): Payer: Medicare Other

## 2016-10-04 ENCOUNTER — Other Ambulatory Visit: Payer: Self-pay | Admitting: Family

## 2016-10-04 ENCOUNTER — Other Ambulatory Visit: Payer: Self-pay

## 2016-10-04 VITALS — BP 119/46 | HR 72 | Temp 98.4°F | Resp 18

## 2016-10-04 DIAGNOSIS — C9300 Acute monoblastic/monocytic leukemia, not having achieved remission: Secondary | ICD-10-CM

## 2016-10-04 DIAGNOSIS — C9302 Acute monoblastic/monocytic leukemia, in relapse: Secondary | ICD-10-CM | POA: Diagnosis not present

## 2016-10-04 DIAGNOSIS — R110 Nausea: Secondary | ICD-10-CM

## 2016-10-04 DIAGNOSIS — D630 Anemia in neoplastic disease: Secondary | ICD-10-CM

## 2016-10-04 LAB — PREPARE RBC (CROSSMATCH)

## 2016-10-04 MED ORDER — ONDANSETRON HCL 4 MG/2ML IJ SOLN: 8.0000 mg | Freq: Once | INTRAMUSCULAR | Status: DC

## 2016-10-04 MED ORDER — FUROSEMIDE 10 MG/ML IJ SOLN
INTRAMUSCULAR | Status: AC
  Filled 2016-10-04: qty 4

## 2016-10-04 MED ORDER — DIPHENHYDRAMINE HCL 25 MG PO CAPS
ORAL_CAPSULE | ORAL | Status: AC
  Filled 2016-10-04: qty 1

## 2016-10-04 MED ORDER — ONDANSETRON HCL 4 MG/2ML IJ SOLN
INTRAMUSCULAR | Status: AC
  Filled 2016-10-04: qty 4

## 2016-10-04 MED ORDER — FUROSEMIDE 10 MG/ML IJ SOLN: 20.0000 mg | Freq: Once | INTRAMUSCULAR | Status: DC

## 2016-10-04 MED ORDER — SODIUM CHLORIDE 0.9 % IV SOLN
Freq: Once | INTRAVENOUS | Status: DC
  Administered 2016-10-04: 09:00:00 via INTRAVENOUS

## 2016-10-04 MED ORDER — DIPHENHYDRAMINE HCL 25 MG PO CAPS
25.0000 mg | ORAL_CAPSULE | Freq: Once | ORAL | Status: AC
  Administered 2016-10-04: 25 mg via ORAL

## 2016-10-04 MED ORDER — ACETAMINOPHEN 325 MG PO TABS
650.0000 mg | ORAL_TABLET | Freq: Once | ORAL | Status: AC
  Administered 2016-10-04: 650 mg via ORAL

## 2016-10-04 MED ORDER — ONDANSETRON HCL 8 MG PO TABS: 8.0000 mg | ORAL_TABLET | Freq: Three times a day (TID) | ORAL | 5 refills | Status: AC | PRN

## 2016-10-04 MED ORDER — ACETAMINOPHEN 325 MG PO TABS
ORAL_TABLET | ORAL | Status: AC
  Filled 2016-10-04: qty 2

## 2016-10-04 NOTE — Patient Instructions (Signed)
Blood Transfusion A blood transfusion is a procedure in which you are given blood through an IV tube. You may need this procedure because of:  Illness.  Surgery.  Injury. The blood may come from someone else (a donor). You may also be able to donate blood for yourself (autologous blood donation). The blood given in a transfusion is made up of different types of cells. You may get:  Red blood cells. These carry oxygen to the cells in the body.  White blood cells. These help you fight infections.  Platelets. These help your blood to clot.  Plasma. This is the liquid part of your blood. It helps with fluid imbalances. If you have a clotting disorder, you may also get other types of blood products. What happens before the procedure?  You will have a blood test to find out your blood type. The test also finds out what type of blood your body will accept and matches it to the donor type.  If you are going to have a planned surgery, you may be able to donate your own blood. This may be done in case you need a transfusion.  If you have had an allergic reaction to a transfusion in the past, you may be given medicine to help prevent a reaction. This medicine may be given to you by mouth or through an IV.  You will have your temperature, blood pressure, and pulse checked.  Follow instructions from your doctor about what you cannot eat or drink.  Ask your doctor about:  Changing or stopping your regular medicines. This is important if you take diabetes medicines or blood thinners.  Taking medicines such as aspirin and ibuprofen. These medicines can thin your blood. Do not take these medicines before your procedure if your doctor tells you not to. What happens during the procedure?  An IV tube will be put into one of your veins.  The bag of donated blood will be attached to your IV tube. Then, the blood will enter through your vein.  Your temperature, blood pressure, and pulse will be  checked regularly during the procedure. This is done to find early signs of a transfusion reaction.  If you have any signs or symptoms of a reaction, your transfusion will be stopped. You may also be given medicine.  When the transfusion is done, your IV tube will be taken out.  Pressure may be applied to the IV site for a few minutes.  A bandage (dressing) will be put on the IV site. The procedure may vary among doctors and hospitals. What happens after the procedure?  Your temperature, blood pressure, heart rate, breathing rate, and blood oxygen level will be checked often.  Your blood may be tested to see how you are responding to the transfusion.  You may be warmed with fluids or blankets. This is done to keep the temperature of your body normal. Summary  A blood transfusion is a procedure in which you are given blood through an IV tube.  The blood may come from someone else (a donor). You may also be able to donate blood for yourself.  If you have had an allergic reaction to a transfusion in the past, you may be given medicine to help prevent a reaction. This medicine may be given to you by mouth or through an IV tube.  Your temperature, blood pressure, heart rate, breathing rate, and blood oxygen level will be checked often.  Your blood may be tested to see how  you are responding to the transfusion. This information is not intended to replace advice given to you by your health care provider. Make sure you discuss any questions you have with your health care provider. Document Released: 08/10/2008 Document Revised: 01/06/2016 Document Reviewed: 01/06/2016 Elsevier Interactive Patient Education  2017 [place] Discharge Instructions for Patients Receiving Chemotherapy  Today you received the following chemotherapy agents Dacogen.  To help prevent nausea and vomiting after your treatment, we encourage you to take your nausea medication.   If you  develop nausea and vomiting that is not controlled by your nausea medication, call the clinic.   BELOW ARE SYMPTOMS THAT SHOULD BE REPORTED IMMEDIATELY:  *FEVER GREATER THAN 100.5 F  *CHILLS WITH OR WITHOUT FEVER  NAUSEA AND VOMITING THAT IS NOT CONTROLLED WITH YOUR NAUSEA MEDICATION  *UNUSUAL SHORTNESS OF BREATH  *UNUSUAL BRUISING OR BLEEDING  TENDERNESS IN MOUTH AND THROAT WITH OR WITHOUT PRESENCE OF ULCERS  *URINARY PROBLEMS  *BOWEL PROBLEMS  UNUSUAL RASH Items with * indicate a potential emergency and should be followed up as soon as possible.  Feel free to call the clinic you have any questions or concerns. The clinic phone number is [phone].  Please show the [facility] at check-in to the Emergency Department and triage nurse.

## 2016-10-04 NOTE — Addendum Note (Signed)
Addended by: Carolynne Edouard B on: 10/04/2016 09:03 AM   Modules accepted: Orders

## 2016-10-04 NOTE — Progress Notes (Signed)
zofran

## 2016-10-05 LAB — TYPE AND SCREEN
ABO/RH(D): A POS
Antibody Screen: NEGATIVE
UNIT DIVISION: 0
UNIT DIVISION: 0

## 2016-10-05 LAB — BPAM RBC
BLOOD PRODUCT EXPIRATION DATE: 201805242359
Blood Product Expiration Date: 201805242359
ISSUE DATE / TIME: 201805100819
ISSUE DATE / TIME: 201805100819
Unit Type and Rh: 6200
Unit Type and Rh: 6200

## 2016-10-08 ENCOUNTER — Other Ambulatory Visit (HOSPITAL_BASED_OUTPATIENT_CLINIC_OR_DEPARTMENT_OTHER): Payer: Medicare Other

## 2016-10-08 ENCOUNTER — Other Ambulatory Visit: Payer: Self-pay | Admitting: Family

## 2016-10-08 ENCOUNTER — Ambulatory Visit (HOSPITAL_BASED_OUTPATIENT_CLINIC_OR_DEPARTMENT_OTHER): Payer: Medicare Other

## 2016-10-08 ENCOUNTER — Telehealth: Payer: Self-pay | Admitting: *Deleted

## 2016-10-08 VITALS — BP 113/42 | HR 71 | Temp 97.7°F | Resp 16

## 2016-10-08 DIAGNOSIS — C9302 Acute monoblastic/monocytic leukemia, in relapse: Secondary | ICD-10-CM

## 2016-10-08 DIAGNOSIS — C9300 Acute monoblastic/monocytic leukemia, not having achieved remission: Secondary | ICD-10-CM

## 2016-10-08 DIAGNOSIS — D696 Thrombocytopenia, unspecified: Secondary | ICD-10-CM

## 2016-10-08 LAB — CBC WITH DIFFERENTIAL (CANCER CENTER ONLY)
BASO#: 0 10*3/uL (ref 0.0–0.2)
BASO%: 0.8 % (ref 0.0–2.0)
EOS%: 1.9 % (ref 0.0–7.0)
Eosinophils Absolute: 0.1 10*3/uL (ref 0.0–0.5)
HEMATOCRIT: 28.6 % — AB (ref 34.8–46.6)
HEMOGLOBIN: 9.8 g/dL — AB (ref 11.6–15.9)
LYMPH#: 2.6 10*3/uL (ref 0.9–3.3)
LYMPH%: 68.8 % — ABNORMAL HIGH (ref 14.0–48.0)
MCH: 32.6 pg (ref 26.0–34.0)
MCHC: 34.3 g/dL (ref 32.0–36.0)
MCV: 95 fL (ref 81–101)
MONO#: 0.2 10*3/uL (ref 0.1–0.9)
MONO%: 4.8 % (ref 0.0–13.0)
NEUT%: 23.7 % — AB (ref 39.6–80.0)
NEUTROS ABS: 0.9 10*3/uL — AB (ref 1.5–6.5)
Platelets: <6 10*3/uL — CL (ref 145–400)
RBC: 3.01 10*6/uL — AB (ref 3.70–5.32)
RDW: 14.5 % (ref 11.1–15.7)
WBC: 3.8 10*3/uL — ABNORMAL LOW (ref 3.9–10.0)

## 2016-10-08 LAB — COMPREHENSIVE METABOLIC PANEL (CC13)
ALT: 14 IU/L (ref 0–32)
AST: 24 IU/L (ref 0–40)
Albumin, Serum: 4.4 g/dL (ref 3.5–4.8)
Albumin/Globulin Ratio: 2.3 — ABNORMAL HIGH (ref 1.2–2.2)
Alkaline Phosphatase, S: 76 IU/L (ref 39–117)
BILIRUBIN TOTAL: 0.8 mg/dL (ref 0.0–1.2)
BUN/Creatinine Ratio: 18 (ref 12–28)
BUN: 25 mg/dL (ref 8–27)
CALCIUM: 10.7 mg/dL — AB (ref 8.7–10.3)
CHLORIDE: 101 mmol/L (ref 96–106)
Carbon Dioxide, Total: 28 mmol/L (ref 18–29)
Creatinine, Ser: 1.36 mg/dL — ABNORMAL HIGH (ref 0.57–1.00)
GFR calc non Af Amer: 37 mL/min/{1.73_m2} — ABNORMAL LOW (ref >59)
GFR, EST AFRICAN AMERICAN: 43 mL/min/{1.73_m2} — AB (ref >59)
GLUCOSE: 147 mg/dL — AB (ref 65–99)
Globulin, Total: 1.9 g/dL (ref 1.5–4.5)
POTASSIUM: 4 mmol/L (ref 3.5–5.2)
Sodium: 134 mmol/L (ref 134–144)
TOTAL PROTEIN: 6.3 g/dL (ref 6.0–8.5)

## 2016-10-08 LAB — TECHNOLOGIST REVIEW CHCC SATELLITE: Tech Review: 3

## 2016-10-08 MED ORDER — HEPARIN SOD (PORK) LOCK FLUSH 100 UNIT/ML IV SOLN
500.0000 [IU] | Freq: Once | INTRAVENOUS | Status: AC
  Administered 2016-10-08: 500 [IU] via INTRAVENOUS
  Filled 2016-10-08: qty 5

## 2016-10-08 MED ORDER — SODIUM CHLORIDE 0.9% FLUSH
10.0000 mL | INTRAVENOUS | Status: DC | PRN
  Administered 2016-10-08: 10 mL via INTRAVENOUS
  Filled 2016-10-08: qty 10

## 2016-10-08 NOTE — Progress Notes (Signed)
Patient has dime sized bump at BMB site above right buttock. This is not painful. No redness or edema. Small bruise over the site. We will have her try applying warm compresses. She will contact us if this becomes painful or enflamed indicating infection.

## 2016-10-08 NOTE — Telephone Encounter (Signed)
Critical Value Platelets 6 Dr Ennever notified. Orders will be placed. 

## 2016-10-08 NOTE — Patient Instructions (Signed)
Implanted Port Insertion, Care After °This sheet gives you information about how to care for yourself after your procedure. Your health care provider may also give you more specific instructions. If you have problems or questions, contact your health care provider. °What can I expect after the procedure? °After your procedure, it is common to have: °· Discomfort at the port insertion site. °· Bruising on the skin over the port. This should improve over 3-4 days. ° °Follow these instructions at home: °Port care °· After your port is placed, you will get a manufacturer's information card. The card has information about your port. Keep this card with you at all times. °· Take care of the port as told by your health care provider. Ask your health care provider if you or a family member can get training for taking care of the port at home. A home health care nurse may also take care of the port. °· Make sure to remember what type of port you have. °Incision care °· Follow instructions from your health care provider about how to take care of your port insertion site. Make sure you: °? Wash your hands with soap and water before you change your bandage (dressing). If soap and water are not available, use hand sanitizer. °? Change your dressing as told by your health care provider. °? Leave stitches (sutures), skin glue, or adhesive strips in place. These skin closures may need to stay in place for 2 weeks or longer. If adhesive strip edges start to loosen and curl up, you may trim the loose edges. Do not remove adhesive strips completely unless your health care provider tells you to do that. °· Check your port insertion site every day for signs of infection. Check for: °? More redness, swelling, or pain. °? More fluid or blood. °? Warmth. °? Pus or a bad smell. °General instructions °· Do not take baths, swim, or use a hot tub until your health care provider approves. °· Do not lift anything that is heavier than 10 lb (4.5  kg) for a week, or as told by your health care provider. °· Ask your health care provider when it is okay to: °? Return to work or school. °? Resume usual physical activities or sports. °· Do not drive for 24 hours if you were given a medicine to help you relax (sedative). °· Take over-the-counter and prescription medicines only as told by your health care provider. °· Wear a medical alert bracelet in case of an emergency. This will tell any health care providers that you have a port. °· Keep all follow-up visits as told by your health care provider. This is important. °Contact a health care provider if: °· You cannot flush your port with saline as directed, or you cannot draw blood from the port. °· You have a fever or chills. °· You have more redness, swelling, or pain around your port insertion site. °· You have more fluid or blood coming from your port insertion site. °· Your port insertion site feels warm to the touch. °· You have pus or a bad smell coming from the port insertion site. °Get help right away if: °· You have chest pain or shortness of breath. °· You have bleeding from your port that you cannot control. °Summary °· Take care of the port as told by your health care provider. °· Change your dressing as told by your health care provider. °· Keep all follow-up visits as told by your health care provider. °  This information is not intended to replace advice given to you by your health care provider. Make sure you discuss any questions you have with your health care provider. °Document Released: 03/04/2013 Document Revised: 04/04/2016 Document Reviewed: 04/04/2016 °Elsevier Interactive Patient Education © 2017 [employer] ° °

## 2016-10-09 ENCOUNTER — Ambulatory Visit (HOSPITAL_BASED_OUTPATIENT_CLINIC_OR_DEPARTMENT_OTHER): Payer: Medicare Other

## 2016-10-09 DIAGNOSIS — D696 Thrombocytopenia, unspecified: Secondary | ICD-10-CM

## 2016-10-09 DIAGNOSIS — C9302 Acute monoblastic/monocytic leukemia, in relapse: Secondary | ICD-10-CM | POA: Diagnosis not present

## 2016-10-09 DIAGNOSIS — C9300 Acute monoblastic/monocytic leukemia, not having achieved remission: Secondary | ICD-10-CM | POA: Diagnosis not present

## 2016-10-09 MED ORDER — SODIUM CHLORIDE 0.9% FLUSH
10.0000 mL | INTRAVENOUS | Status: AC | PRN
  Administered 2016-10-09: 10 mL
  Filled 2016-10-09: qty 10

## 2016-10-09 MED ORDER — HEPARIN SOD (PORK) LOCK FLUSH 100 UNIT/ML IV SOLN
500.0000 [IU] | Freq: Every day | INTRAVENOUS | Status: AC | PRN
  Administered 2016-10-09: 500 [IU]
  Filled 2016-10-09: qty 5

## 2016-10-09 NOTE — Patient Instructions (Signed)
Platelet Transfusion, Care After Refer to this sheet in the next few weeks. These instructions provide you with information about caring for yourself after your procedure. Your health care provider may also give you more specific instructions. Your treatment has been planned according to current medical practices, but problems sometimes occur. Call your health care provider if you have any problems or questions after your procedure. What can I expect after the procedure? After the procedure, it is common to have:  Bruising and soreness at the IV site.  Fever or chills within the first 48 hours of your transfusion. Follow these instructions at home:  Take medicines only as directed by your health care provider. Ask your health care provider if you can take an over-the-counter pain reliever in case you have a fever or headache a day or two after your transfusion.  Return to your normal activities as directed by your health care provider. Contact a health care provider if:  You have a fever.  You have a headache.  You have redness, swelling, or pain at your IV site.  You have skin itching or a rash.  You vomit.  You feel unusually tired or weak. Get help right away if:  You have trouble breathing.  You have a decreased amount of urine or you urinate less often than you normally do.  Your urine is darker than normal.  You have pain in your back, abdomen, or chest.  You have cool, clammy skin.  You have a rapid heartbeat. This information is not intended to replace advice given to you by your health care provider. Make sure you discuss any questions you have with your health care provider. Document Released: 06/04/2014 Document Revised: 10/20/2015 Document Reviewed: 03/24/2014 Elsevier Interactive Patient Education  2017 [employer].

## 2016-10-10 LAB — PREPARE PLATELET PHERESIS: Unit division: 0

## 2016-10-10 LAB — BPAM PLATELET PHERESIS
Blood Product Expiration Date: 201805162359
ISSUE DATE / TIME: 201805150836
Unit Type and Rh: 7300

## 2016-10-11 ENCOUNTER — Ambulatory Visit: Payer: Medicare Other

## 2016-10-11 ENCOUNTER — Other Ambulatory Visit (HOSPITAL_BASED_OUTPATIENT_CLINIC_OR_DEPARTMENT_OTHER): Payer: Medicare Other

## 2016-10-11 DIAGNOSIS — C9300 Acute monoblastic/monocytic leukemia, not having achieved remission: Secondary | ICD-10-CM

## 2016-10-11 DIAGNOSIS — C9302 Acute monoblastic/monocytic leukemia, in relapse: Secondary | ICD-10-CM

## 2016-10-11 LAB — MANUAL DIFFERENTIAL (CHCC SATELLITE)
ALC: 6.9 10*3/uL — AB (ref 0.6–2.2)
ANC (CHCC MAN DIFF): 0.5 10*3/uL — AB (ref 1.5–6.7)
BASO: 1 % (ref 0–2)
Blasts: 16 % — ABNORMAL HIGH (ref 0–0)
EOS: 1 % (ref 0–7)
LYMPH: 72 % — AB (ref 14–48)
MONO: 3 % (ref 0–13)
Myelocytes: 1 % — ABNORMAL HIGH (ref 0–0)
PLT EST ~~LOC~~: DECREASED
PROMYELO: 2 % — ABNORMAL HIGH (ref 0–0)
SEG: 4 % — AB (ref 40–75)
nRBC: 2 % — ABNORMAL HIGH (ref 0–0)

## 2016-10-11 LAB — COMPREHENSIVE METABOLIC PANEL (CC13)
A/G RATIO: 2.2 (ref 1.2–2.2)
ALBUMIN: 4.4 g/dL (ref 3.5–4.8)
ALK PHOS: 70 IU/L (ref 39–117)
ALT: 14 IU/L (ref 0–32)
AST: 31 IU/L (ref 0–40)
BUN / CREAT RATIO: 12 (ref 12–28)
BUN: 17 mg/dL (ref 8–27)
Bilirubin Total: 0.8 mg/dL (ref 0.0–1.2)
CALCIUM: 11.7 mg/dL — AB (ref 8.7–10.3)
CO2: 28 mmol/L (ref 18–29)
CREATININE: 1.44 mg/dL — AB (ref 0.57–1.00)
Chloride, Ser: 102 mmol/L (ref 96–106)
GFR calc Af Amer: 40 mL/min/{1.73_m2} — ABNORMAL LOW (ref >59)
GFR, EST NON AFRICAN AMERICAN: 35 mL/min/{1.73_m2} — AB (ref >59)
GLOBULIN, TOTAL: 2 g/dL (ref 1.5–4.5)
Glucose: 133 mg/dL — ABNORMAL HIGH (ref 65–99)
POTASSIUM: 3.9 mmol/L (ref 3.5–5.2)
SODIUM: 137 mmol/L (ref 134–144)
Total Protein: 6.4 g/dL (ref 6.0–8.5)

## 2016-10-11 LAB — CBC WITH DIFFERENTIAL (CANCER CENTER ONLY)
HCT: 26.9 % — ABNORMAL LOW (ref 34.8–46.6)
HGB: 9 g/dL — ABNORMAL LOW (ref 11.6–15.9)
MCH: 31.8 pg (ref 26.0–34.0)
MCHC: 33.5 g/dL (ref 32.0–36.0)
MCV: 95 fL (ref 81–101)
PLATELETS: 21 10*3/uL — AB (ref 145–400)
RBC: 2.83 10*6/uL — AB (ref 3.70–5.32)
RDW: 14.4 % (ref 11.1–15.7)
WBC: 9.6 10*3/uL (ref 3.9–10.0)

## 2016-10-11 MED ORDER — SODIUM CHLORIDE 0.9% FLUSH
10.0000 mL | INTRAVENOUS | Status: DC | PRN
  Filled 2016-10-11: qty 10

## 2016-10-11 MED ORDER — HEPARIN SOD (PORK) LOCK FLUSH 100 UNIT/ML IV SOLN
500.0000 [IU] | Freq: Once | INTRAVENOUS | Status: DC
  Filled 2016-10-11: qty 5

## 2016-10-12 ENCOUNTER — Emergency Department (HOSPITAL_COMMUNITY): Payer: Medicare Other

## 2016-10-12 ENCOUNTER — Encounter (HOSPITAL_COMMUNITY): Payer: Self-pay | Admitting: *Deleted

## 2016-10-12 ENCOUNTER — Inpatient Hospital Stay (HOSPITAL_COMMUNITY)
Admission: EM | Admit: 2016-10-12 | Discharge: 2016-10-15 | DRG: 872 | Disposition: A | Discharge status: Hospice | Payer: Medicare Other | Attending: Internal Medicine | Admitting: Internal Medicine

## 2016-10-12 ENCOUNTER — Other Ambulatory Visit: Payer: Self-pay

## 2016-10-12 DIAGNOSIS — Z87891 Personal history of nicotine dependence: Secondary | ICD-10-CM | POA: Diagnosis not present

## 2016-10-12 DIAGNOSIS — D61818 Other pancytopenia: Secondary | ICD-10-CM | POA: Diagnosis not present

## 2016-10-12 DIAGNOSIS — Z79899 Other long term (current) drug therapy: Secondary | ICD-10-CM

## 2016-10-12 DIAGNOSIS — C9202 Acute myeloblastic leukemia, in relapse: Secondary | ICD-10-CM | POA: Diagnosis present

## 2016-10-12 DIAGNOSIS — A4151 Sepsis due to Escherichia coli [E. coli]: Secondary | ICD-10-CM | POA: Diagnosis not present

## 2016-10-12 DIAGNOSIS — R627 Adult failure to thrive: Secondary | ICD-10-CM | POA: Diagnosis present

## 2016-10-12 DIAGNOSIS — N179 Acute kidney failure, unspecified: Secondary | ICD-10-CM | POA: Diagnosis present

## 2016-10-12 DIAGNOSIS — Z806 Family history of leukemia: Secondary | ICD-10-CM | POA: Diagnosis not present

## 2016-10-12 DIAGNOSIS — C9300 Acute monoblastic/monocytic leukemia, not having achieved remission: Secondary | ICD-10-CM | POA: Diagnosis not present

## 2016-10-12 DIAGNOSIS — I130 Hypertensive heart and chronic kidney disease with heart failure and stage 1 through stage 4 chronic kidney disease, or unspecified chronic kidney disease: Secondary | ICD-10-CM | POA: Diagnosis present

## 2016-10-12 DIAGNOSIS — N183 Chronic kidney disease, stage 3 (moderate): Secondary | ICD-10-CM | POA: Diagnosis present

## 2016-10-12 DIAGNOSIS — R531 Weakness: Secondary | ICD-10-CM | POA: Diagnosis present

## 2016-10-12 DIAGNOSIS — Z818 Family history of other mental and behavioral disorders: Secondary | ICD-10-CM | POA: Diagnosis not present

## 2016-10-12 DIAGNOSIS — E876 Hypokalemia: Secondary | ICD-10-CM | POA: Diagnosis present

## 2016-10-12 DIAGNOSIS — Z8249 Family history of ischemic heart disease and other diseases of the circulatory system: Secondary | ICD-10-CM | POA: Diagnosis not present

## 2016-10-12 DIAGNOSIS — E785 Hyperlipidemia, unspecified: Secondary | ICD-10-CM | POA: Diagnosis present

## 2016-10-12 DIAGNOSIS — B961 Klebsiella pneumoniae [K. pneumoniae] as the cause of diseases classified elsewhere: Secondary | ICD-10-CM | POA: Diagnosis present

## 2016-10-12 DIAGNOSIS — A419 Sepsis, unspecified organism: Secondary | ICD-10-CM | POA: Diagnosis present

## 2016-10-12 DIAGNOSIS — N390 Urinary tract infection, site not specified: Secondary | ICD-10-CM | POA: Diagnosis present

## 2016-10-12 DIAGNOSIS — Z9221 Personal history of antineoplastic chemotherapy: Secondary | ICD-10-CM | POA: Diagnosis not present

## 2016-10-12 DIAGNOSIS — D696 Thrombocytopenia, unspecified: Secondary | ICD-10-CM | POA: Diagnosis present

## 2016-10-12 DIAGNOSIS — Z66 Do not resuscitate: Secondary | ICD-10-CM | POA: Diagnosis present

## 2016-10-12 DIAGNOSIS — C9302 Acute monoblastic/monocytic leukemia, in relapse: Secondary | ICD-10-CM | POA: Diagnosis not present

## 2016-10-12 DIAGNOSIS — I5022 Chronic systolic (congestive) heart failure: Secondary | ICD-10-CM | POA: Diagnosis present

## 2016-10-12 DIAGNOSIS — Z515 Encounter for palliative care: Secondary | ICD-10-CM | POA: Diagnosis not present

## 2016-10-12 LAB — URINALYSIS, ROUTINE W REFLEX MICROSCOPIC
Bilirubin Urine: NEGATIVE
GLUCOSE, UA: NEGATIVE mg/dL
Ketones, ur: NEGATIVE mg/dL
NITRITE: POSITIVE — AB
PH: 5 (ref 5.0–8.0)
PROTEIN: NEGATIVE mg/dL
Specific Gravity, Urine: 1.018 (ref 1.005–1.030)

## 2016-10-12 LAB — COMPREHENSIVE METABOLIC PANEL
ALBUMIN: 4 g/dL (ref 3.5–5.0)
ALK PHOS: 59 U/L (ref 38–126)
ALT: 15 U/L (ref 14–54)
AST: 31 U/L (ref 15–41)
Anion gap: 9 (ref 5–15)
BILIRUBIN TOTAL: 1.2 mg/dL (ref 0.3–1.2)
BUN: 20 mg/dL (ref 6–20)
CO2: 26 mmol/L (ref 22–32)
Calcium: 10.7 mg/dL — ABNORMAL HIGH (ref 8.9–10.3)
Chloride: 103 mmol/L (ref 101–111)
Creatinine, Ser: 1.52 mg/dL — ABNORMAL HIGH (ref 0.44–1.00)
GFR calc Af Amer: 37 mL/min — ABNORMAL LOW (ref >60)
GFR calc non Af Amer: 32 mL/min — ABNORMAL LOW (ref >60)
GLUCOSE: 127 mg/dL — AB (ref 65–99)
Potassium: 3.4 mmol/L — ABNORMAL LOW (ref 3.5–5.1)
Sodium: 138 mmol/L (ref 135–145)
TOTAL PROTEIN: 6.2 g/dL — AB (ref 6.5–8.1)

## 2016-10-12 LAB — CBC WITH DIFFERENTIAL/PLATELET
BLASTS: 61 %
Band Neutrophils: 0 %
Basophils Absolute: 0 10*3/uL (ref 0.0–0.1)
Basophils Relative: 0 %
Eosinophils Absolute: 0.2 10*3/uL (ref 0.0–0.7)
Eosinophils Relative: 1 %
HEMATOCRIT: 23.4 % — AB (ref 36.0–46.0)
HEMOGLOBIN: 7.8 g/dL — AB (ref 12.0–15.0)
LYMPHS PCT: 37 %
Lymphs Abs: 7.4 10*3/uL — ABNORMAL HIGH (ref 0.7–4.0)
MCH: 31.1 pg (ref 26.0–34.0)
MCHC: 33.3 g/dL (ref 30.0–36.0)
MCV: 93.2 fL (ref 78.0–100.0)
MONOS PCT: 0 %
Metamyelocytes Relative: 0 %
Monocytes Absolute: 0 10*3/uL — ABNORMAL LOW (ref 0.1–1.0)
Myelocytes: 0 %
NEUTROS PCT: 1 %
Neutro Abs: 0.2 10*3/uL — ABNORMAL LOW (ref 1.7–7.7)
OTHER: 0 %
Platelets: 19 10*3/uL — CL (ref 150–400)
Promyelocytes Absolute: 0 %
RBC: 2.51 MIL/uL — AB (ref 3.87–5.11)
RDW: 15 % (ref 11.5–15.5)
WBC: 19.9 10*3/uL — AB (ref 4.0–10.5)
nRBC: 2 /100 WBC — ABNORMAL HIGH

## 2016-10-12 LAB — I-STAT TROPONIN, ED: Troponin i, poc: 0.02 ng/mL (ref 0.00–0.08)

## 2016-10-12 LAB — I-STAT CG4 LACTIC ACID, ED: Lactic Acid, Venous: 0.6 mmol/L (ref 0.5–1.9)

## 2016-10-12 LAB — PATHOLOGIST SMEAR REVIEW

## 2016-10-12 LAB — MRSA PCR SCREENING: MRSA BY PCR: NEGATIVE

## 2016-10-12 MED ORDER — SODIUM CHLORIDE 0.9 % IV SOLN: INTRAVENOUS | Status: DC

## 2016-10-12 MED ORDER — ALLOPURINOL 300 MG PO TABS
300.0000 mg | ORAL_TABLET | Freq: Every day | ORAL | Status: DC
  Administered 2016-10-12 – 2016-10-14 (×3): 300 mg via ORAL
  Filled 2016-10-12 (×2): qty 3
  Filled 2016-10-12: qty 1
  Filled 2016-10-12: qty 3

## 2016-10-12 MED ORDER — SODIUM CHLORIDE 0.9% FLUSH
3.0000 mL | Freq: Two times a day (BID) | INTRAVENOUS | Status: DC
  Administered 2016-10-12 – 2016-10-15 (×6): 3 mL via INTRAVENOUS

## 2016-10-12 MED ORDER — VITAMIN D3 25 MCG (1000 UNIT) PO TABS
1000.0000 [IU] | ORAL_TABLET | Freq: Every day | ORAL | Status: DC
  Administered 2016-10-13 – 2016-10-14 (×2): 1000 [IU] via ORAL
  Filled 2016-10-12 (×2): qty 1

## 2016-10-12 MED ORDER — ENSURE ENLIVE PO LIQD
237.0000 mL | Freq: Two times a day (BID) | ORAL | Status: DC
  Administered 2016-10-13 – 2016-10-15 (×3): 237 mL via ORAL

## 2016-10-12 MED ORDER — PROCHLORPERAZINE MALEATE 10 MG PO TABS: 10.0000 mg | ORAL_TABLET | Freq: Four times a day (QID) | ORAL | Status: DC | PRN

## 2016-10-12 MED ORDER — ORPHENADRINE CITRATE ER 100 MG PO TB12
100.0000 mg | ORAL_TABLET | Freq: Every evening | ORAL | Status: DC | PRN
  Filled 2016-10-12: qty 1

## 2016-10-12 MED ORDER — ADULT MULTIVITAMIN W/MINERALS CH
1.0000 | ORAL_TABLET | Freq: Every day | ORAL | Status: DC
  Administered 2016-10-13 – 2016-10-15 (×3): 1 via ORAL
  Filled 2016-10-12 (×4): qty 1

## 2016-10-12 MED ORDER — SODIUM CHLORIDE 0.9 % IV BOLUS (SEPSIS)
500.0000 mL | Freq: Once | INTRAVENOUS | Status: AC
  Administered 2016-10-12: 500 mL via INTRAVENOUS

## 2016-10-12 MED ORDER — DEXTROSE 5 % IV SOLN
1.0000 g | INTRAVENOUS | Status: DC
  Administered 2016-10-13 – 2016-10-14 (×2): 1 g via INTRAVENOUS
  Filled 2016-10-12 (×2): qty 10

## 2016-10-12 MED ORDER — ALPRAZOLAM 0.5 MG PO TABS
0.5000 mg | ORAL_TABLET | Freq: Three times a day (TID) | ORAL | Status: DC | PRN
  Administered 2016-10-14: 0.5 mg via ORAL
  Filled 2016-10-12: qty 1

## 2016-10-12 MED ORDER — SODIUM CHLORIDE 0.9 % IV BOLUS (SEPSIS): 500.0000 mL | Freq: Once | INTRAVENOUS | Status: DC

## 2016-10-12 MED ORDER — ONDANSETRON HCL 4 MG PO TABS
8.0000 mg | ORAL_TABLET | Freq: Three times a day (TID) | ORAL | Status: DC | PRN
  Administered 2016-10-14: 8 mg via ORAL
  Filled 2016-10-12: qty 2

## 2016-10-12 MED ORDER — CARVEDILOL 3.125 MG PO TABS
3.1250 mg | ORAL_TABLET | Freq: Two times a day (BID) | ORAL | Status: DC
  Administered 2016-10-12 – 2016-10-14 (×5): 3.125 mg via ORAL
  Filled 2016-10-12 (×4): qty 1

## 2016-10-12 MED ORDER — PAROXETINE HCL 20 MG PO TABS
20.0000 mg | ORAL_TABLET | Freq: Every day | ORAL | Status: DC
  Administered 2016-10-13 – 2016-10-15 (×3): 20 mg via ORAL
  Filled 2016-10-12 (×3): qty 1

## 2016-10-12 MED ORDER — CEFTRIAXONE SODIUM 1 G IJ SOLR
1.0000 g | Freq: Once | INTRAMUSCULAR | Status: AC
  Administered 2016-10-12: 1 g via INTRAVENOUS
  Filled 2016-10-12: qty 10

## 2016-10-12 NOTE — ED Notes (Signed)
20 minute timer started. Will transport pt at 1810 per charge nurse.

## 2016-10-12 NOTE — ED Notes (Signed)
RN accessing port and collecting blood work. 

## 2016-10-12 NOTE — ED Provider Notes (Signed)
[facility] DEPT Provider Note   CSN: [id number] Arrival date & time: 10/12/16  1056     History   Chief Complaint Chief Complaint  Patient presents with  . Weakness    HPI Kristina Meyer is a 76 y.o. female.  76yo F w/ PMH including AML, sCHF, HTn, HLD who p/w weakness. Yesterday she began having generalized weakness and nausea associated w/ gagging and decreased appetite. She went to a PCP office yesterday where she had blood drawn which was stable. The weakness has worsened; today she went to the bathroom and her husband could barely get her out. No complaints of pain, fever, cough, diarrhea, CP, SOB, or urinary symptoms.    The history is provided by the patient.  Weakness     Past Medical History:  Diagnosis Date  . Acute systolic CHF (congestive heart failure) ([place])    a. 04/2016: EF 35%, Grade 2 DD, Severe HK, mild to moderate MR  . 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 Anxiety   . Heart murmur    echo done 20 yrs ago  normal  . Hyperlipidemia   . Hypertension   . MVP (mitral valve prolapse)   . PONV (postoperative nausea and vomiting)    req patch    Patient Active Problem List   Diagnosis Date Noted  . 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 Pleural effusion, left   . Acute respiratory failure with hypoxemia ([place]) 04/25/2016  . Encephalopathy acute 04/25/2016  . S/P thoracentesis   . Septic shock ([place])   . Severe sepsis ([place])   . HCAP (healthcare-associated pneumonia) 04/23/2016  . Pancytopenia ([place]) 04/23/2016  . Hypokalemia 04/23/2016  . Dehydration 04/23/2016  . Pneumonia involving left lung 04/23/2016  . Anxiety disorder 04/04/2016  . Glucose intolerance (impaired glucose tolerance) 04/04/2016  . History of mitral valve prolapse 04/04/2016  . History of osteopenia 04/04/2016  . Hyperlipidemia 04/04/2016  . 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 Leukocytosis 12/21/2015  . Back pain 12/02/2013   . 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 Pseudophakia of both eyes 04/09/2011    Past Surgical History:  Procedure Laterality Date  . BREAST CYST EXCISION Left 1970  . BREAST SURGERY Left 76   lump   . EYE SURGERY Bilateral 09   cataracts  . HEMORRHOID SURGERY  85  . IR GENERIC HISTORICAL  01/11/2016   IR US GUIDE VASC ACCESS RIGHT 01/11/2016 Jacqulynn Cadet, MD [facility]  . IR GENERIC HISTORICAL  01/11/2016   IR FLUORO GUIDE CV LINE RIGHT 01/11/2016 Jacqulynn Cadet, MD [facility]  . LUMBAR LAMINECTOMY/DECOMPRESSION MICRODISCECTOMY Right 12/02/2013   Procedure: L5-S1 RIGHT DISCECTOMY;  Surgeon: Melina Schools, MD;  Location: [facility];  Service: Orthopedics;  Laterality: Right;    OB History    No data available       Home Medications    Prior to Admission medications   Medication Sig Start Date End Date Taking? Authorizing Provider  allopurinol (ZYLOPRIM) 300 MG tablet Take 300 mg by mouth. 09/18/16  Yes [provider]  ALPRAZolam (XANAX) 0.5 MG tablet TAKE 1 TABLET BY MOUTH 3 TIMES A DAY AS NEEDED Patient taking differently: TAKE 1 TABLET BY MOUTH 3 TIMES A DAY AS NEEDED for anxiety. 03/26/16  Yes Volanda Napoleon, MD  atorvastatin (LIPITOR) 20 MG tablet Take 0.5 tablets (10 mg total) by mouth at bedtime. 09/03/16  Yes Riki Sheer  Eddie Dibbles, DO  Calcium-Magnesium-Vitamin D (CALCIUM 1200+D3 PO) Take 1 tablet by mouth daily.   Yes [provider]  carvedilol (COREG) 6.25 MG tablet Take 1 tablet (6.25 mg total) by mouth 2 (two) times daily. 08/22/16 11/20/16 Yes Lelon Perla, MD  cholecalciferol (VITAMIN D) 1000 UNITS tablet Take 1,000 Units by mouth daily.   Yes [provider]  KLOR-CON M20 20 MEQ tablet TAKE 1 TABLET BY MOUTH 2 TIMES DAILY FOR 7 DAYS THEN 1 TAB DAILY 09/27/16  Yes Ennever, Rudell Cobb, MD  lidocaine-prilocaine (EMLA) cream Apply 1 application topically as needed. Place quarter sized amount of  cream directly on portacath and cover with saran wrap at least 1 - 1 1/2 hours prior to procedure. 01/11/16  Yes Volanda Napoleon, MD  losartan (COZAAR) 25 MG tablet Take 1 tablet (25 mg total) by mouth daily. 06/20/16 10/12/16 Yes Lelon Perla, MD  Multiple Vitamins-Minerals (MULTIVITAMIN ADULT) TABS Take 1 tablet by mouth daily.   Yes [provider]  ondansetron (ZOFRAN) 8 MG tablet Take 1 tablet (8 mg total) by mouth every 8 (eight) hours as needed for nausea or vomiting. 10/04/16  Yes Ennever, Rudell Cobb, MD  orphenadrine (NORFLEX) 100 MG tablet Take 1 tablet (100 mg total) by mouth at bedtime as needed for muscle spasms. 12/29/15  Yes Volanda Napoleon, MD  PARoxetine (PAXIL) 20 MG tablet Take 1 tablet (20 mg total) by mouth daily. 10/03/16  Yes Shelda Pal, DO  prochlorperazine (COMPAZINE) 10 MG tablet Take 1 tablet (10 mg total) by mouth every 6 (six) hours as needed for nausea or vomiting. 10/03/16  Yes Ennever, Rudell Cobb, MD  sulfamethoxazole-trimethoprim (BACTRIM DS) 800-160 MG per tablet Take 1 tablet by mouth daily.   Yes [provider]  magic mouthwash w/lidocaine SOLN Take 5 mLs by mouth 4 (four) times daily as needed for mouth pain. Patient not taking: Reported on 10/12/2016 06/08/16   Volanda Napoleon, MD    Family History Family History  Problem Relation Age of Onset  . Heart failure Mother   . Depression Mother   . Heart attack Father 3  . Leukemia Cousin     Social History Social History  Substance Use Topics  . Smoking status: Former Smoker    Packs/day: 0.50    Years: 5.00    Types: Cigarettes    Quit date: 12/02/1966  . Smokeless tobacco: Never Used  . Alcohol use No     Allergies   Other   Review of Systems Review of Systems  Neurological: Positive for weakness.     Physical Exam Updated Vital Signs BP (!) 115/44   Pulse 100   Temp 98.4 F (36.9 C) (Oral)   Resp (!) 21   SpO2 90%   Physical Exam  Constitutional: She is  oriented to person, place, and time. She appears well-developed and well-nourished. No distress.  HENT:  Head: Normocephalic.  Ecchymoses L forehead and below both eyes; Moist mucous membranes  Eyes: Conjunctivae are normal. Pupils are equal, round, and reactive to light.  Neck: Neck supple.  Cardiovascular: Normal rate, regular rhythm and normal heart sounds.   No murmur heard. Pulmonary/Chest: Effort normal and breath sounds normal.  Abdominal: Soft. Bowel sounds are normal. She exhibits no distension. There is no tenderness.  Musculoskeletal: She exhibits no edema.  Neurological: She is alert and oriented to person, place, and time.  5/5 strength BUE, 4/5 strength BLE, Fluent speech  Skin: Skin is warm  and dry.  Scattered ecchymoses on arms and face  Psychiatric: She has a normal mood and affect. Judgment normal.  Nursing note and vitals reviewed.    ED Treatments / Results  Labs (all labs ordered are listed, but only abnormal results are displayed) Labs Reviewed  COMPREHENSIVE METABOLIC PANEL - Abnormal; Notable for the following:       Result Value   Potassium 3.4 (*)    Glucose, Bld 127 (*)    Creatinine, Ser 1.52 (*)    Calcium 10.7 (*)    Total Protein 6.2 (*)    GFR calc non Af Amer 32 (*)    GFR calc Af Amer 37 (*)    All other components within normal limits  CBC WITH DIFFERENTIAL/PLATELET - Abnormal; Notable for the following:    WBC 19.9 (*)    RBC 2.51 (*)    Hemoglobin 7.8 (*)    HCT 23.4 (*)    Platelets 19 (*)    nRBC 2 (*)    Neutro Abs 0.2 (*)    Lymphs Abs 7.4 (*)    Monocytes Absolute 0.0 (*)    All other components within normal limits  URINALYSIS, ROUTINE W REFLEX MICROSCOPIC - Abnormal; Notable for the following:    APPearance CLOUDY (*)    Hgb urine dipstick LARGE (*)    Nitrite POSITIVE (*)    Leukocytes, UA TRACE (*)    Bacteria, UA MANY (*)    Squamous Epithelial / LPF 0-5 (*)    Non Squamous Epithelial 0-5 (*)    All other components  within normal limits  URINE CULTURE  CULTURE, BLOOD (ROUTINE X 2)  CULTURE, BLOOD (ROUTINE X 2)  PATHOLOGIST SMEAR REVIEW  I-STAT TROPOININ, ED  I-STAT CG4 LACTIC ACID, ED    EKG  EKG Interpretation None       Radiology Dg Chest 2 View  Result Date: 10/12/2016 CLINICAL DATA:  Nausea, weakness, leukemia, receiving chemotherapy EXAM: CHEST  2 VIEW COMPARISON:  05/16/2016 FINDINGS: Right IJ power port catheter tip lower SVC level as before. Normal heart size and vascularity. Lungs remain clear. No focal pneumonia, collapse or consolidation. Negative for edema, effusion or pneumothorax. Trachea is midline. Atherosclerosis noted of the aorta. Degenerative changes of the spine. IMPRESSION: No acute chest process. Electronically Signed   By: Jerilynn Mages.  Shick M.D.   On: 10/12/2016 12:19    Procedures Procedures (including critical care time)  Medications Ordered in ED Medications  cefTRIAXone (ROCEPHIN) 1 g in dextrose 5 % 50 mL IVPB (not administered)  sodium chloride 0.9 % bolus 500 mL (0 mLs Intravenous Stopped 10/12/16 1647)     Initial Impression / Assessment and Plan / ED Course  I have reviewed the triage vital signs and the nursing notes.  Pertinent labs & imaging results that were available during my care of the patient were reviewed by me and considered in my medical decision making (see chart for details).    PT w/ AML and known From his cytopenia presents with 1 day of worsening weakness associated with nausea. Came in after husband was unable to help the patient out of bathroom today. On exam, she was nontoxic, no acute distress. She had scattered bruises but states that these appeared spontaneously and she denies any falls or recent trauma. She had no abdominal tenderness. Obtained above lab work which shows normal lactate, normal troponin, white blood cells elevated from yesterday at 19.9, hemoglobin 7.8, platelets 19,000. Creatinine 1.52 which is similar to previous.  Her UA is  consistent with infection. Added urine and blood cultures and gave a dose of ceftriaxone in addition to small fluid bolus. Because of her worsening weakness and significant risk with falling because of her thrombocytopenia, discussed admission for UTI treatment with Dr. Erlinda Hong, hospitalist. Patient admitted for further care.  Final Clinical Impressions(s) / ED Diagnoses   Final diagnoses:  Urinary tract infection without hematuria, site unspecified  Thrombocytopenia (HCC)  Generalized weakness    New Prescriptions New Prescriptions   No medications on file     Dierra Riesgo, Wenda Overland, MD 10/12/16 1719

## 2016-10-12 NOTE — ED Notes (Signed)
Blood culture order placed after pt had already received antibiotics.

## 2016-10-12 NOTE — ED Notes (Signed)
Bed: PQ33 Expected date:  Expected time:  Means of arrival:  Comments: EMS-cancer patient

## 2016-10-12 NOTE — ED Notes (Signed)
Hospitalist at bedside 

## 2016-10-12 NOTE — ED Triage Notes (Signed)
Pt bib EMS and comes from home.  Pt presents with weakness and nausea that began yesterday afternoon after she went to the MD to be evaluated.  Pt states she had blood work done and it was normal.  EMS reports pt was positive for orthostatics and an IV was started and a 512ml bag of fluid was hung.  Pt is a leukemia and got her last chemo treatment last week.  Pt is not to have any more chemo treatments.  EMS saw a LBBB on pt's EKG along with some elevation and called pt's cardiologist who confirmed that was a normal EKG for pt.  Pt a/o x 4.  EMS reports pt is warm to the touch. Hx of Pneumonia with admission a month ago.

## 2016-10-12 NOTE — ED Notes (Signed)
Pt requesting her port be accessed for blood work

## 2016-10-12 NOTE — H&P (Signed)
History and Physical  Kristina Meyer JSH:[id number] DOB: [birth year] DOA: 10/12/2016  Referring physician: EDP PCP: Kristina Pal, DO   Chief Complaint: weakness  HPI: Kristina Meyer is a 76 y.o. female  With AML relapse since 08/2016 received chemotherapy last on 5/9 with decitabine, she got blood transfusion on5/10, then platelet transfusion on 5/14 due to thrombocytopenia plt of 6 on 5/14,she is brought to  to [facility] ED by EMS due to increased weakness and feeling nauseous. . today she went to the bathroom and her husband could barely get her out EMS reports pt was positive for orthostatics and an IV was started and a 522ml bag of fluid was hung.   ED course: she has sinus tachycardia, heart rate above 100, , tachypnea, RR 22-23. bp stable, no hypoxia. cxr no acute findings, ua + uti, though patient denies urinary symptom, wbc 19.9 + blasts, hgb7.8, plt 19, cr 1.5, she is given rocephin, hospitalist called to admit the patient, I requested blood culture to be drawn in the ED.  Patient has No fever, no diarrhea, no bleeding, no chest pain, no edema, she does has diffuse ecchymosis.  Review of Systems:  Detail per HPI, Review of systems are otherwise negative  Past Medical History:  Diagnosis Date  . Acute systolic CHF (congestive heart failure) ([place])    a. 04/2016: EF 35%, Grade 2 DD, Severe HK, mild to moderate MR  . AML M5 (acute monocytic leukemia) ([place]) 01/05/2016  . Anxiety   . Heart murmur    echo done 20 yrs ago  normal  . Hyperlipidemia   . Hypertension   . MVP (mitral valve prolapse)   . PONV (postoperative nausea and vomiting)    req patch   Past Surgical History:  Procedure Laterality Date  . BREAST CYST EXCISION Left 1970  . BREAST SURGERY Left 76   lump   . EYE SURGERY Bilateral 09   cataracts  . HEMORRHOID SURGERY  85  . IR GENERIC HISTORICAL  01/11/2016   IR US GUIDE VASC ACCESS RIGHT 01/11/2016 Kristina Cadet, MD [facility]-INTERV RAD  . IR GENERIC HISTORICAL  01/11/2016   IR FLUORO GUIDE CV LINE RIGHT 01/11/2016 Kristina Cadet, MD [facility]INTER[facility]  . LUMBAR LAMINECTOMY/DECOMPRESSION MICRODISCECTOMY Right 12/02/2013   Procedure: L5-S1 RIGHT DISCECTOMY;  Surgeon: Kristina Schools, MD;  Location: [facility];  Service: Orthopedics;  Laterality: Right;   Social History:  reports that she quit smoking about 49 years ago. Her smoking use included Cigarettes. She has a 2.50 pack-year smoking history. She has never used smokeless tobacco. She reports that she does not drink alcohol or use drugs. Patient lives at home with husband & has progressive weakness, not able to get up from the toilet  Allergies  Allergen Reactions  . Other Other (See Comments)    GENERAL Anesthesia, vomiting    Family History  Problem Relation Age of Onset  . Heart failure Mother   . Depression Mother   . Heart attack Father 70  . Leukemia Cousin       Prior to Admission medications   Medication Sig Start Date End Date Taking? Authorizing Provider  allopurinol (ZYLOPRIM) 300 MG tablet Take 300 mg by mouth. 09/18/16  Yes [provider]  ALPRAZolam (XANAX) 0.5 MG tablet TAKE 1 TABLET BY MOUTH 3 TIMES A DAY AS NEEDED Patient taking differently: TAKE 1 TABLET BY MOUTH 3 TIMES A DAY AS NEEDED for anxiety. 03/26/16  Yes Kristina Napoleon, MD  atorvastatin (LIPITOR) 20 MG  tablet Take 0.5 tablets (10 mg total) by mouth at bedtime. 09/03/16  Yes Kristina Pal, DO  Calcium-Magnesium-Vitamin D (CALCIUM 1200+D3 PO) Take 1 tablet by mouth daily.   Yes [provider]  carvedilol (COREG) 6.25 MG tablet Take 1 tablet (6.25 mg total) by mouth 2 (two) times daily. 08/22/16 11/20/16 Yes Kristina Perla, MD  cholecalciferol (VITAMIN D) 1000 UNITS tablet Take 1,000 Units by mouth daily.   Yes [provider]  KLOR-CON M20 20 MEQ tablet TAKE 1 TABLET BY MOUTH 2 TIMES DAILY FOR 7 DAYS THEN 1 TAB DAILY 09/27/16  Yes Kristina, Rudell Cobb, MD  lidocaine-prilocaine (EMLA) cream Apply 1  application topically as needed. Place quarter sized amount of cream directly on portacath and cover with saran wrap at least 1 - 1 1/2 hours prior to procedure. 01/11/16  Yes Kristina Napoleon, MD  losartan (COZAAR) 25 MG tablet Take 1 tablet (25 mg total) by mouth daily. 06/20/16 10/12/16 Yes Kristina Perla, MD  Multiple Vitamins-Minerals (MULTIVITAMIN ADULT) TABS Take 1 tablet by mouth daily.   Yes [provider]  ondansetron (ZOFRAN) 8 MG tablet Take 1 tablet (8 mg total) by mouth every 8 (eight) hours as needed for nausea or vomiting. 10/04/16  Yes Kristina, Rudell Cobb, MD  orphenadrine (NORFLEX) 100 MG tablet Take 1 tablet (100 mg total) by mouth at bedtime as needed for muscle spasms. 12/29/15  Yes Kristina Napoleon, MD  PARoxetine (PAXIL) 20 MG tablet Take 1 tablet (20 mg total) by mouth daily. 10/03/16  Yes Kristina Pal, DO  prochlorperazine (COMPAZINE) 10 MG tablet Take 1 tablet (10 mg total) by mouth every 6 (six) hours as needed for nausea or vomiting. 10/03/16  Yes Kristina, Rudell Cobb, MD  sulfamethoxazole-trimethoprim (BACTRIM DS) 800-160 MG per tablet Take 1 tablet by mouth daily.   Yes [provider]  magic mouthwash w/lidocaine SOLN Take 5 mLs by mouth 4 (four) times daily as needed for mouth pain. Patient not taking: Reported on 10/12/2016 06/08/16   Kristina Napoleon, MD    Physical Exam: BP (!) 125/52 (BP Location: Right Arm)   Pulse (!) 103   Temp 98.4 F (36.9 C) (Oral)   Resp 18   SpO2 96%   General:  Thin, Frail, pale, chronically ill, anxious Eyes: PERRL ENT: unremarkable Neck: supple, no JVD Cardiovascular: sinus tachcardia Respiratory: diminished at basis, no rales, no rhonchi, no wheezing Abdomen: soft/ND/ND, positive bowel sounds Skin: diffuse eccymosis Musculoskeletal:  No edema Psychiatric: calm/cooperative Neurologic: aaox3, no focal deficit           Labs on Admission:  Basic Metabolic Panel:  Recent Labs Lab 10/08/16 1402  10/11/16 1358 10/12/16 1318  NA 134 137 138  K 4.0 3.9 3.4*  CL 101 102 103  CO2 28 28 26   GLUCOSE 147* 133* 127*  BUN 25 17 20   CREATININE 1.36* 1.44* 1.52*  CALCIUM 10.7* 11.7* 10.7*   Liver Function Tests:  Recent Labs Lab 10/08/16 1402 10/11/16 1358 10/12/16 1318  AST 24 31 31   ALT 14 14 15   ALKPHOS 76 70 59  BILITOT 0.8 0.8 1.2  PROT 6.3 6.4 6.2*  ALBUMIN 4.4 4.4 4.0   No results for input(s): LIPASE, AMYLASE in the last 168 hours. No results for input(s): AMMONIA in the last 168 hours. CBC:  Recent Labs Lab 10/08/16 1402 10/11/16 1358 10/12/16 1318  WBC 3.8* 9.6 19.9*  NEUTROABS 0.9*  --  0.2*  HGB 9.8* 9.0*  7.8*  HCT 28.6* 26.9* 23.4*  MCV 95 95 93.2  PLT <6* 21* 19*   Cardiac Enzymes: No results for input(s): CKTOTAL, CKMB, CKMBINDEX, TROPONINI in the last 168 hours.  BNP (last 3 results) No results for input(s): BNP in the last 8760 hours.  ProBNP (last 3 results) No results for input(s): PROBNP in the last 8760 hours.  CBG: No results for input(s): GLUCAP in the last 168 hours.  Radiological Exams on Admission: Dg Chest 2 View  Result Date: 10/12/2016 CLINICAL DATA:  Nausea, weakness, leukemia, receiving chemotherapy EXAM: CHEST  2 VIEW COMPARISON:  05/16/2016 FINDINGS: Right IJ power port catheter tip lower SVC level as before. Normal heart size and vascularity. Lungs remain clear. No focal pneumonia, collapse or consolidation. Negative for edema, effusion or pneumothorax. Trachea is midline. Atherosclerosis noted of the aorta. Degenerative changes of the spine. IMPRESSION: No acute chest process. Electronically Signed   By: Jerilynn Mages.  Shick M.D.   On: 10/12/2016 12:19    EKG: Independently reviewed. Sinus rhythm, chronic LBBB. No acute st/t changes  Assessment/Plan Present on Admission: . Sepsis ([place])  Sepsis in an immunosuppressed individual: She presented with  sinus tachycardia, heart rate above 100, , tachypnea, RR 22-23. Wbc19.9,  And UTI,   Blood culture obtained from the ED ( though it seems after rocephin given), urine culture ordered Continue rocephin and ivf, follow up on culture result  AKI on CKD III Cr 1.1 on 5/9 Cr has been increasing since 5/14 Cr on presentation 1.52 Likely from uti/sepsis Hold losartan, decrease coreg dose, avoid hypotension, treat uti. Repeat bmp in am.  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  H/o systolic CHF during severe sepsis/pneumonia/prolonged hospitalization in 04/2016, repeat echocardiogram in 07/2016, lvef has improved to 50-55% Continue coreg ( at reduced does with parameters), hold losartan in the setting of sepsis She currently looks dry to euvolemic, will close monitor volume status.  FTT: progressive weakness, will need PT eval once medically stable,  Over all poor prognosis due to AML in relapse, FTT   DVT prophylaxis: scd  Consultants: none  Code Status: DNR  Family Communication:  Patient and son at bedside, code status verified with son  Disposition Plan: admit to stepdown  Time spent:  4mins  Geniene List MD, PhD [facility] Pager [phone] If 7PM-7AM, please contact night-coverage at [email or web address], password [facility]

## 2016-10-13 LAB — COMPREHENSIVE METABOLIC PANEL
ALBUMIN: 3.6 g/dL (ref 3.5–5.0)
ALT: 12 U/L — ABNORMAL LOW (ref 14–54)
ANION GAP: 9 (ref 5–15)
AST: 29 U/L (ref 15–41)
Alkaline Phosphatase: 50 U/L (ref 38–126)
BUN: 22 mg/dL — ABNORMAL HIGH (ref 6–20)
CO2: 25 mmol/L (ref 22–32)
Calcium: 9.8 mg/dL (ref 8.9–10.3)
Chloride: 105 mmol/L (ref 101–111)
Creatinine, Ser: 1.35 mg/dL — ABNORMAL HIGH (ref 0.44–1.00)
GFR calc Af Amer: 42 mL/min — ABNORMAL LOW (ref >60)
GFR calc non Af Amer: 37 mL/min — ABNORMAL LOW (ref >60)
GLUCOSE: 104 mg/dL — AB (ref 65–99)
POTASSIUM: 3.2 mmol/L — AB (ref 3.5–5.1)
SODIUM: 139 mmol/L (ref 135–145)
Total Bilirubin: 1 mg/dL (ref 0.3–1.2)
Total Protein: 5.3 g/dL — ABNORMAL LOW (ref 6.5–8.1)

## 2016-10-13 LAB — PROTIME-INR
INR: 1.93
Prothrombin Time: 22.3 seconds — ABNORMAL HIGH (ref 11.4–15.2)

## 2016-10-13 LAB — CBC
HCT: 20.9 % — ABNORMAL LOW (ref 36.0–46.0)
HEMOGLOBIN: 7 g/dL — AB (ref 12.0–15.0)
MCH: 31.3 pg (ref 26.0–34.0)
MCHC: 33.5 g/dL (ref 30.0–36.0)
MCV: 93.3 fL (ref 78.0–100.0)
Platelets: 11 10*3/uL — CL (ref 150–400)
RBC: 2.24 MIL/uL — ABNORMAL LOW (ref 3.87–5.11)
RDW: 15.5 % (ref 11.5–15.5)
WBC: 17.1 10*3/uL — ABNORMAL HIGH (ref 4.0–10.5)

## 2016-10-13 LAB — MAGNESIUM: MAGNESIUM: 1.6 mg/dL — AB (ref 1.7–2.4)

## 2016-10-13 LAB — PREPARE RBC (CROSSMATCH)

## 2016-10-13 MED ORDER — SODIUM CHLORIDE 0.9% FLUSH: 10.0000 mL | INTRAVENOUS | Status: DC | PRN

## 2016-10-13 MED ORDER — MAGNESIUM SULFATE 2 GM/50ML IV SOLN
2.0000 g | Freq: Once | INTRAVENOUS | Status: AC
  Administered 2016-10-13: 2 g via INTRAVENOUS
  Filled 2016-10-13: qty 50

## 2016-10-13 MED ORDER — ACETAMINOPHEN 325 MG PO TABS
650.0000 mg | ORAL_TABLET | Freq: Four times a day (QID) | ORAL | Status: DC | PRN
  Administered 2016-10-14 – 2016-10-15 (×2): 650 mg via ORAL
  Filled 2016-10-13 (×2): qty 2

## 2016-10-13 MED ORDER — ACETAMINOPHEN 325 MG PO TABS
650.0000 mg | ORAL_TABLET | Freq: Once | ORAL | Status: AC
  Administered 2016-10-13: 650 mg via ORAL
  Filled 2016-10-13: qty 2

## 2016-10-13 MED ORDER — DIPHENHYDRAMINE HCL 25 MG PO CAPS
25.0000 mg | ORAL_CAPSULE | Freq: Once | ORAL | Status: AC
  Administered 2016-10-13: 25 mg via ORAL
  Filled 2016-10-13: qty 1

## 2016-10-13 MED ORDER — POTASSIUM CHLORIDE IN NACL 20-0.9 MEQ/L-% IV SOLN
INTRAVENOUS | Status: AC
  Administered 2016-10-13: 1000 mL via INTRAVENOUS
  Filled 2016-10-13 (×2): qty 1000

## 2016-10-13 MED ORDER — POTASSIUM CHLORIDE CRYS ER 20 MEQ PO TBCR
40.0000 meq | EXTENDED_RELEASE_TABLET | Freq: Once | ORAL | Status: AC
  Administered 2016-10-13: 40 meq via ORAL
  Filled 2016-10-13: qty 2

## 2016-10-13 MED ORDER — SODIUM CHLORIDE 0.9 % IV SOLN
Freq: Once | INTRAVENOUS | Status: AC
  Administered 2016-10-13: 16:00:00 via INTRAVENOUS

## 2016-10-13 NOTE — Progress Notes (Signed)
PROGRESS NOTE    Kristina Meyer  ONG:[id number]  DOB: 1941/02/11  DOA: 10/12/2016 PCP: Shelda Pal, DO Outpatient Specialists:   Brief Admission Hx: With AML relapse since 08/2016 received chemotherapy last on 5/9 with decitabine, she got blood transfusion on5/10, then platelet transfusion on 5/14 due to thrombocytopenia plt of 6 on 5/14,she is brought to  to [facility] ED by EMS due to increased weakness and feeling nauseous. . today she went to the bathroom and her husband could barely get her out EMS reports pt was positive for orthostatics and an IV was started and a 572ml bag of fluid was hung.  Assessment & Plan:   Sepsis in an immunosuppressed individual: She presented with  sinus tachycardia, heart rate above 100, , tachypnea, RR 22-23. Wbc19.9,  and UTI.  Sepsis physiology is improving with aggressive therapies.    Blood culture obtained from the ED no growth to date, urine culture ordered Continue rocephin and IVF, follow up on culture results  AKI on CKD III Cr 1.1 on 5/9, slightly improved Cr has been increasing since 5/14 Cr on presentation 1.52 Likely from uti/sepsis Hold losartan, decrease coreg dose, avoid hypotension, treat uti. Repeat bmp in am.  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  Hypokalemia - replace low Mg, give oral and IV replacement, recheck in AM.   Hypomagnesemia - replace IV, recheck in AM.   H/o systolic CHF during severe sepsis/pneumonia/prolonged hospitalization in 04/2016, repeat echocardiogram in 07/2016, lvef has improved to 50-55% Continue coreg ( at reduced does with parameters), hold losartan in the setting of sepsis and worsening CKD.  FTT: progressive weakness, will need PT eval once medically  stable,  Over all poor prognosis due to AML in relapse, FTT  DVT prophylaxis: scd  Consultants: none  Code Status: DNR  Family Communication:  Patient and son at bedside, code status verified with son   Subjective: Pt more alert today.    Objective: Vitals:   10/13/16 0500 10/13/16 0600 10/13/16 0700 10/13/16 0856  BP: (!) 95/42 (!) 113/30 (!) 102/43 (!) 122/38  Pulse: (!) 106 (!) 102 (!) 104 99  Resp: (!) 22 (!) 21 (!) 25   Temp:  98.3 F (36.8 C)    TempSrc:  Oral    SpO2: 94% (!) 89% 95%   Weight:      Height:        Intake/Output Summary (Last 24 hours) at 10/13/16 0907 Last data filed at 10/13/16 0330  Gross per 24 hour  Intake              790 ml  Output              700 ml  Net               90 ml   Filed Weights   10/12/16 1834  Weight: 58.1 kg (128 lb 1.4 oz)    Exam:  General exam: awake, alert, NAD. Cooperative.   Respiratory system: Clear. No increased work of breathing. Cardiovascular system: S1 & S2 heard, RRR. No JVD, murmurs, gallops, clicks or pedal edema. Gastrointestinal system: Abdomen is nondistended, soft and nontender. Normal bowel sounds heard. Central nervous system: Alert and oriented. No focal neurological deficits. Extremities: no CCE.  Data Reviewed: Basic  Metabolic Panel:  Recent Labs Lab 10/08/16 1402 10/11/16 1358 10/12/16 1318 10/13/16 0500  NA 134 137 138 139  K 4.0 3.9 3.4* 3.2*  CL 101 102 103 105  CO2 28 28 26 25   GLUCOSE 147* 133* 127* 104*  BUN 25 17 20  22*  CREATININE 1.36* 1.44* 1.52* 1.35*  CALCIUM 10.7* 11.7* 10.7* 9.8  MG  --   --   --  1.6*   Liver Function Tests:  Recent Labs Lab 10/08/16 1402 10/11/16 1358 10/12/16 1318 10/13/16 0500  AST 24 31 31 29   ALT 14 14 15  12*  ALKPHOS 76 70 59 50  BILITOT 0.8 0.8 1.2 1.0  PROT 6.3 6.4 6.2* 5.3*  ALBUMIN 4.4 4.4 4.0 3.6   No results for input(s): LIPASE, AMYLASE in the last 168 hours. No results for input(s): AMMONIA in the last 168  hours. CBC:  Recent Labs Lab 10/08/16 1402 10/11/16 1358 10/12/16 1318 10/13/16 0744  WBC 3.8* 9.6 19.9* 17.1*  NEUTROABS 0.9*  --  0.2*  --   HGB 9.8* 9.0* 7.8* 7.0*  HCT 28.6* 26.9* 23.4* 20.9*  MCV 95 95 93.2 93.3  PLT <6* 21* 19* 11*   Cardiac Enzymes: No results for input(s): CKTOTAL, CKMB, CKMBINDEX, TROPONINI in the last 168 hours. CBG (last 3)  No results for input(s): GLUCAP in the last 72 hours. Recent Results (from the past 240 hour(s))  MRSA PCR Screening     Status: None   Collection Time: 10/12/16  6:35 PM  Result Value Ref Range Status   MRSA by PCR NEGATIVE NEGATIVE Final    Comment:        The GeneXpert MRSA Assay (FDA approved for NASAL specimens only), is one component of a comprehensive MRSA colonization surveillance program. It is not intended to diagnose MRSA infection nor to guide or monitor treatment for MRSA infections.      Studies: Dg Chest 2 View  Result Date: 10/12/2016 CLINICAL DATA:  Nausea, weakness, leukemia, receiving chemotherapy EXAM: CHEST  2 VIEW COMPARISON:  05/16/2016 FINDINGS: Right IJ power port catheter tip lower SVC level as before. Normal heart size and vascularity. Lungs remain clear. No focal pneumonia, collapse or consolidation. Negative for edema, effusion or pneumothorax. Trachea is midline. Atherosclerosis noted of the aorta. Degenerative changes of the spine. IMPRESSION: No acute chest process. Electronically Signed   By: Jerilynn Mages.  Shick M.D.   On: 10/12/2016 12:19     Scheduled Meds: . allopurinol  300 mg Oral Daily  . carvedilol  3.125 mg Oral BID WC  . cholecalciferol  1,000 Units Oral Daily  . feeding supplement (ENSURE ENLIVE)  237 mL Oral BID BM  . multivitamin with minerals  1 tablet Oral Daily  . PARoxetine  20 mg Oral Daily  . potassium chloride  40 mEq Oral Once  . sodium chloride flush  3 mL Intravenous Q12H   Continuous Infusions: . 0.9 % NaCl with KCl 20 mEq / L    . cefTRIAXone (ROCEPHIN)  IV    .  magnesium sulfate 1 - 4 g bolus IVPB      Active Problems:   Sepsis [facility])   Critical Care Time spent: 40 mins  Irwin Brakeman, MD, FAAFP [facility] Pager [phone]  If 7PM-7AM, please contact night-coverage [email or web address] Password [email or web address] 10/13/2016, 9:07 AM    LOS: 1 day

## 2016-10-14 DIAGNOSIS — E876 Hypokalemia: Secondary | ICD-10-CM

## 2016-10-14 DIAGNOSIS — A4151 Sepsis due to Escherichia coli [E. coli]: Secondary | ICD-10-CM

## 2016-10-14 DIAGNOSIS — D696 Thrombocytopenia, unspecified: Secondary | ICD-10-CM

## 2016-10-14 LAB — BASIC METABOLIC PANEL
Anion gap: 8 (ref 5–15)
BUN: 18 mg/dL (ref 6–20)
CHLORIDE: 105 mmol/L (ref 101–111)
CO2: 24 mmol/L (ref 22–32)
Calcium: 9.3 mg/dL (ref 8.9–10.3)
Creatinine, Ser: 1.08 mg/dL — ABNORMAL HIGH (ref 0.44–1.00)
GFR calc Af Amer: 55 mL/min — ABNORMAL LOW (ref >60)
GFR calc non Af Amer: 48 mL/min — ABNORMAL LOW (ref >60)
Glucose, Bld: 117 mg/dL — ABNORMAL HIGH (ref 65–99)
POTASSIUM: 3.5 mmol/L (ref 3.5–5.1)
SODIUM: 137 mmol/L (ref 135–145)

## 2016-10-14 LAB — MAGNESIUM: MAGNESIUM: 2.1 mg/dL (ref 1.7–2.4)

## 2016-10-14 LAB — CBC WITH DIFFERENTIAL/PLATELET
BLASTS: 63 %
Basophils Absolute: 0 10*3/uL (ref 0.0–0.1)
Basophils Relative: 0 %
Eosinophils Absolute: 0.2 10*3/uL (ref 0.0–0.7)
Eosinophils Relative: 1 %
HEMATOCRIT: 27.4 % — AB (ref 36.0–46.0)
HEMOGLOBIN: 9.6 g/dL — AB (ref 12.0–15.0)
LYMPHS ABS: 4.7 10*3/uL — AB (ref 0.7–4.0)
Lymphocytes Relative: 31 %
MCH: 31.3 pg (ref 26.0–34.0)
MCHC: 35 g/dL (ref 30.0–36.0)
MCV: 89.3 fL (ref 78.0–100.0)
Monocytes Absolute: 0.8 10*3/uL (ref 0.1–1.0)
Monocytes Relative: 5 %
NEUTROS ABS: 0 10*3/uL — AB (ref 1.7–7.7)
NRBC: 5 /100{WBCs} — AB
Neutrophils Relative %: 0 %
Platelets: 9 10*3/uL — CL (ref 150–400)
RBC: 3.07 MIL/uL — ABNORMAL LOW (ref 3.87–5.11)
RDW: 16.6 % — ABNORMAL HIGH (ref 11.5–15.5)
WBC: 15.1 10*3/uL — ABNORMAL HIGH (ref 4.0–10.5)

## 2016-10-14 LAB — URINE CULTURE
Culture: >=100000 — AB
SPECIAL REQUESTS: NORMAL

## 2016-10-14 MED ORDER — CEFPODOXIME PROXETIL 200 MG PO TABS
200.0000 mg | ORAL_TABLET | Freq: Two times a day (BID) | ORAL | Status: DC
  Administered 2016-10-15: 200 mg via ORAL
  Filled 2016-10-14 (×2): qty 1

## 2016-10-14 MED ORDER — SODIUM CHLORIDE 0.9 % IV SOLN
Freq: Once | INTRAVENOUS | Status: AC
  Administered 2016-10-14: 250 mL via INTRAVENOUS

## 2016-10-14 MED ORDER — CARVEDILOL 6.25 MG PO TABS
6.2500 mg | ORAL_TABLET | Freq: Two times a day (BID) | ORAL | Status: DC
  Administered 2016-10-15: 6.25 mg via ORAL
  Filled 2016-10-14 (×2): qty 1

## 2016-10-14 MED ORDER — POTASSIUM CHLORIDE CRYS ER 20 MEQ PO TBCR
40.0000 meq | EXTENDED_RELEASE_TABLET | Freq: Once | ORAL | Status: AC
  Administered 2016-10-14: 40 meq via ORAL
  Filled 2016-10-14: qty 2

## 2016-10-14 NOTE — Progress Notes (Signed)
Pt had small amount of rectal bleeding, incontinent of small amount of stool. Dr. Erlinda Hong notified.

## 2016-10-14 NOTE — Progress Notes (Signed)
Pt has had 2 more occurrences of small amounts of rectal bleeding. Pt received 1 unit Platelets. Rash noted all over her body(brownish),Denies that it's itchy.

## 2016-10-14 NOTE — Progress Notes (Signed)
Pt had one more episode of blood from her rectum, small amount. Family at the bedside today.

## 2016-10-14 NOTE — Progress Notes (Signed)
PROGRESS NOTE    Kristina Meyer  MEQ:[id number]  DOB: 06/23/1940  DOA: 10/12/2016 PCP: Shelda Pal, DO Outpatient Specialists:   Brief Admission Hx: With AML relapse since 08/2016 received chemotherapy last on 5/9 with decitabine, she got blood transfusion on5/10, then platelet transfusion on 5/14 due to thrombocytopenia plt of 6 on 5/14,she is brought to  to [facility] ED by EMS due to increased weakness and feeling nauseous. . today she went to the bathroom and her husband could barely get her out EMS reports pt was positive for orthostatics and an IV was started and a 579ml bag of fluid was hung.  Assessment & Plan:   Sepsis in an immunosuppressed individual: She presented with  sinus tachycardia, heart rate above 100, , tachypnea, RR 22-23. Wbc19.9,  and UTI.  Sepsis physiology is improving with aggressive therapies.    Blood culture obtained from the ED no growth to date, urine culture with klebsiella pneumoniae She is treated with rocephin and IVF since admission, will transition to oral abx vantin, continue ivf for another 24hrs  AKI on CKD III Cr 1.1 on 5/9 Cr has been increasing since 5/14 Cr on presentation on 5/18, 1.52 Likely from uti/sepsis Hold losartan,  avoid hypotension, treat uti. 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 She is getting plt transfusion on 5/20 due to platelet dropped to 9 Oncology Dr Marin Olp consulted  Hypokalemia/hypomagnesemia: replace, keep mag>2, k>4   H/o systolic CHF during severe sepsis/pneumonia/prolonged hospitalization in 04/2016, repeat echocardiogram in 07/2016, lvef has improved to 50-55% Continue coreg ( dose reduced to 3.125 bid on presentation due to sepsis, bp improving, coreg  dose changed back to home dose at 6.25 bid on 5/20),  hold losartan in the setting of sepsis and worsening CKD. D/c ivf on 5/20  FTT: progressive weakness, will need PT eval once medically stable,  Over all poor prognosis due to AML in relapse, FTT  DVT prophylaxis: scd  Consultants: oncology  Code Status: DNR  Family Communication:  Patient   Subjective: Anxious, denies pain, no fever RN report blood clot in urine this am plt 9 this am  Objective: Vitals:   10/13/16 2018 10/13/16 2043 10/13/16 2348 10/14/16 0711  BP: (!) 109/49 (!) 118/52 (!) 119/52 135/66  Pulse: 87 89 94 95  Resp: 17 18 18 18   Temp: 98.6 F (37 C) 98.5 F (36.9 C) 98.3 F (36.8 C) 98.7 F (37.1 C)  TempSrc: Oral Oral Oral Oral  SpO2: 93% 93% 95% 94%  Weight:      Height:        Intake/Output Summary (Last 24 hours) at 10/14/16 0811 Last data filed at 10/14/16 0654  Gross per 24 hour  Intake             2877 ml  Output                0 ml  Net             2877 ml   Filed Weights   10/12/16 1834  Weight: 58.1 kg (128 lb 1.4 oz)    Exam:  General exam: Thin, Frail, pale, chronically ill, anxious Respiratory system: Clear. No increased work of breathing. Cardiovascular system: S1 & S2 heard, RRR.  No JVD, murmurs, gallops, clicks or pedal edema. Gastrointestinal system: Abdomen is nondistended, soft and nontender. Normal bowel sounds heard. Central nervous system: Alert and oriented. No focal neurological deficits. Extremities: no CCE.  Data Reviewed: Basic Metabolic Panel:  Recent Labs Lab 10/08/16 1402 10/11/16 1358 10/12/16 1318 10/13/16 0500 10/14/16 0355  NA 134 137 138 139 137  K 4.0 3.9 3.4* 3.2* 3.5  CL 101 102 103 105 105  CO2 28 28 26 25 24   GLUCOSE 147* 133* 127* 104* 117*  BUN 25 17 20  22* 18  CREATININE 1.36* 1.44* 1.52* 1.35* 1.08*  CALCIUM 10.7* 11.7* 10.7* 9.8 9.3  MG  --   --   --  1.6* 2.1   Liver Function Tests:  Recent Labs Lab 10/08/16 1402  10/11/16 1358 10/12/16 1318 10/13/16 0500  AST 24 31 31 29   ALT 14 14 15  12*  ALKPHOS 76 70 59 50  BILITOT 0.8 0.8 1.2 1.0  PROT 6.3 6.4 6.2* 5.3*  ALBUMIN 4.4 4.4 4.0 3.6   No results for input(s): LIPASE, AMYLASE in the last 168 hours. No results for input(s): AMMONIA in the last 168 hours. CBC:  Recent Labs Lab 10/08/16 1402 10/11/16 1358 10/12/16 1318 10/13/16 0744 10/14/16 0355  WBC 3.8* 9.6 19.9* 17.1* 15.1*  NEUTROABS 0.9*  --  0.2*  --  0.0*  HGB 9.8* 9.0* 7.8* 7.0* 9.6*  HCT 28.6* 26.9* 23.4* 20.9* 27.4*  MCV 95 95 93.2 93.3 89.3  PLT <6* 21* 19* 11* 9*   Cardiac Enzymes: No results for input(s): CKTOTAL, CKMB, CKMBINDEX, TROPONINI in the last 168 hours. CBG (last 3)  No results for input(s): GLUCAP in the last 72 hours. Recent Results (from the past 240 hour(s))  Urine culture     Status: Abnormal   Collection Time: 10/12/16  3:41 PM  Result Value Ref Range Status   Specimen Description URINE, CLEAN CATCH  Final   Special Requests Normal  Final   Culture >=100,000 COLONIES/mL KLEBSIELLA PNEUMONIAE (A)  Final   Report Status 10/14/2016 FINAL  Final   Organism ID, Bacteria KLEBSIELLA PNEUMONIAE (A)  Final      Susceptibility   Klebsiella pneumoniae - MIC*    AMPICILLIN >=32 RESISTANT Resistant     CEFAZOLIN <=4 SENSITIVE Sensitive     CEFTRIAXONE <=1 SENSITIVE Sensitive     CIPROFLOXACIN <=0.25 SENSITIVE Sensitive     GENTAMICIN <=1 SENSITIVE Sensitive     IMIPENEM <=0.25 SENSITIVE Sensitive     NITROFURANTOIN 64 INTERMEDIATE Intermediate     TRIMETH/SULFA >=320 RESISTANT Resistant     AMPICILLIN/SULBACTAM 4 SENSITIVE Sensitive     PIP/TAZO <=4 SENSITIVE Sensitive     Extended ESBL NEGATIVE Sensitive     * >=100,000 COLONIES/mL KLEBSIELLA PNEUMONIAE  MRSA PCR Screening     Status: None   Collection Time: 10/12/16  6:35 PM  Result Value Ref Range Status   MRSA by PCR NEGATIVE NEGATIVE Final    Comment:        The GeneXpert MRSA Assay (FDA approved  for NASAL specimens only), is one component of a comprehensive MRSA colonization surveillance program. It is not intended to diagnose MRSA infection nor to guide or monitor treatment for MRSA infections.   Culture, blood (routine x 2)     Status: None (Preliminary result)   Collection Time: 10/12/16  7:00 PM  Result Value Ref Range Status   Specimen Description BLOOD RIGHT ANTECUBITAL  Final   Special Requests   Final  BOTTLES DRAWN AEROBIC AND ANAEROBIC Blood Culture adequate volume   Culture   Final    NO GROWTH < 24 HOURS Performed at [facility], [place] [address], [place], [place] [address]    Report Status PENDING  Incomplete  Culture, blood (routine x 2)     Status: None (Preliminary result)   Collection Time: 10/12/16  7:06 PM  Result Value Ref Range Status   Specimen Description BLOOD RIGHT ARM  Final   Special Requests   Final    BOTTLES DRAWN AEROBIC AND ANAEROBIC Blood Culture adequate volume   Culture   Final    NO GROWTH < 24 HOURS Performed at [facility], [place] [address] [place], [place] [address]    Report Status PENDING  Incomplete     Studies: Dg Chest 2 View  Result Date: 10/12/2016 CLINICAL DATA:  Nausea, weakness, leukemia, receiving chemotherapy EXAM: CHEST  2 VIEW COMPARISON:  05/16/2016 FINDINGS: Right IJ power port catheter tip lower SVC level as before. Normal heart size and vascularity. Lungs remain clear. No focal pneumonia, collapse or consolidation. Negative for edema, effusion or pneumothorax. Trachea is midline. Atherosclerosis noted of the aorta. Degenerative changes of the spine. IMPRESSION: No acute chest process. Electronically Signed   By: Jerilynn Mages.  Shick M.D.   On: 10/12/2016 12:19     Scheduled Meds: . allopurinol  300 mg Oral Daily  . carvedilol  3.125 mg Oral BID WC  . cholecalciferol  1,000 Units Oral Daily  . feeding supplement (ENSURE ENLIVE)  237 mL Oral BID BM  . multivitamin with minerals  1 tablet Oral Daily  .  PARoxetine  20 mg Oral Daily  . sodium chloride flush  3 mL Intravenous Q12H   Continuous Infusions: . sodium chloride    . 0.9 % NaCl with KCl 20 mEq / L 1,000 mL (10/13/16 0923)  . cefTRIAXone (ROCEPHIN)  IV Stopped (10/13/16 1112)    Active Problems:   Sepsis [facility])   Critical Care Time spent: 86 mins  Kanyia Heaslip, MD, PhD [facility] Pager [phone]  If 7PM-7AM, please contact night-coverage [email or web address] Password [email or web address] 10/14/2016, 8:11 AM    LOS: 2 days

## 2016-10-15 DIAGNOSIS — C9302 Acute monoblastic/monocytic leukemia, in relapse: Secondary | ICD-10-CM

## 2016-10-15 DIAGNOSIS — C9300 Acute monoblastic/monocytic leukemia, not having achieved remission: Secondary | ICD-10-CM

## 2016-10-15 DIAGNOSIS — Z515 Encounter for palliative care: Secondary | ICD-10-CM

## 2016-10-15 DIAGNOSIS — B961 Klebsiella pneumoniae [K. pneumoniae] as the cause of diseases classified elsewhere: Secondary | ICD-10-CM

## 2016-10-15 DIAGNOSIS — R531 Weakness: Secondary | ICD-10-CM

## 2016-10-15 LAB — CBC WITH DIFFERENTIAL/PLATELET
BASOS PCT: 0 %
Band Neutrophils: 0 %
Basophils Absolute: 0 10*3/uL (ref 0.0–0.1)
Blasts: 57 %
Eosinophils Absolute: 0 10*3/uL (ref 0.0–0.7)
Eosinophils Relative: 0 %
HEMATOCRIT: 27.3 % — AB (ref 36.0–46.0)
Hemoglobin: 9.2 g/dL — ABNORMAL LOW (ref 12.0–15.0)
LYMPHS PCT: 41 %
Lymphs Abs: 13.6 10*3/uL — ABNORMAL HIGH (ref 0.7–4.0)
MCH: 30.2 pg (ref 26.0–34.0)
MCHC: 33.7 g/dL (ref 30.0–36.0)
MCV: 89.5 fL (ref 78.0–100.0)
MYELOCYTES: 0 %
Metamyelocytes Relative: 0 %
Monocytes Absolute: 0.3 10*3/uL (ref 0.1–1.0)
Monocytes Relative: 1 %
NEUTROS ABS: 0.3 10*3/uL — AB (ref 1.7–7.7)
NRBC: 2 /100{WBCs} — AB
Neutrophils Relative %: 1 %
PROMYELOCYTES ABS: 0 %
Platelets: 51 10*3/uL — ABNORMAL LOW (ref 150–400)
RBC: 3.05 MIL/uL — AB (ref 3.87–5.11)
RDW: 16.8 % — ABNORMAL HIGH (ref 11.5–15.5)
WBC: 33.2 10*3/uL — ABNORMAL HIGH (ref 4.0–10.5)

## 2016-10-15 LAB — TYPE AND SCREEN
ABO/RH(D): A POS
ANTIBODY SCREEN: NEGATIVE
UNIT DIVISION: 0
Unit division: 0

## 2016-10-15 LAB — BPAM RBC
BLOOD PRODUCT EXPIRATION DATE: 201806052359
Blood Product Expiration Date: 201806062359
ISSUE DATE / TIME: 201805192011
ISSUE DATE / TIME: 201805191616
UNIT TYPE AND RH: 6200
Unit Type and Rh: 6200

## 2016-10-15 LAB — BASIC METABOLIC PANEL
Anion gap: 9 (ref 5–15)
BUN: 15 mg/dL (ref 6–20)
CO2: 26 mmol/L (ref 22–32)
CREATININE: 0.95 mg/dL (ref 0.44–1.00)
Calcium: 9.5 mg/dL (ref 8.9–10.3)
Chloride: 105 mmol/L (ref 101–111)
GFR calc Af Amer: >60 mL/min (ref >60)
GFR calc non Af Amer: 56 mL/min — ABNORMAL LOW (ref >60)
Glucose, Bld: 110 mg/dL — ABNORMAL HIGH (ref 65–99)
POTASSIUM: 3.2 mmol/L — AB (ref 3.5–5.1)
Sodium: 140 mmol/L (ref 135–145)

## 2016-10-15 LAB — PREPARE PLATELET PHERESIS: UNIT DIVISION: 0

## 2016-10-15 LAB — BPAM PLATELET PHERESIS
Blood Product Expiration Date: 201805202359
ISSUE DATE / TIME: 201805201209
Unit Type and Rh: 6200

## 2016-10-15 MED ORDER — HEPARIN SOD (PORK) LOCK FLUSH 100 UNIT/ML IV SOLN
500.0000 [IU] | INTRAVENOUS | Status: AC | PRN
  Administered 2016-10-15: 500 [IU]

## 2016-10-15 MED ORDER — POTASSIUM CHLORIDE CRYS ER 20 MEQ PO TBCR: 20.0000 meq | EXTENDED_RELEASE_TABLET | Freq: Every day | ORAL | 0 refills | Status: AC

## 2016-10-15 MED ORDER — CEFPODOXIME PROXETIL 200 MG PO TABS: 200.0000 mg | ORAL_TABLET | Freq: Two times a day (BID) | ORAL | 0 refills | Status: AC

## 2016-10-15 MED ORDER — OXYCODONE HCL 20 MG/ML PO CONC: 5.0000 mg | ORAL | Status: DC | PRN

## 2016-10-15 MED ORDER — OXYCODONE HCL 20 MG/ML PO CONC: 5.0000 mg | ORAL | 0 refills | Status: AC | PRN

## 2016-10-15 MED ORDER — ACETAMINOPHEN 325 MG PO TABS: 650.0000 mg | ORAL_TABLET | Freq: Four times a day (QID) | ORAL | 0 refills | Status: AC | PRN

## 2016-10-15 MED ORDER — HYDROXYUREA 500 MG PO CAPS
1000.0000 mg | ORAL_CAPSULE | Freq: Two times a day (BID) | ORAL | Status: DC
  Administered 2016-10-15: 1000 mg via ORAL
  Filled 2016-10-15: qty 2

## 2016-10-15 MED ORDER — HYDROXYUREA 500 MG PO CAPS: 1000.0000 mg | ORAL_CAPSULE | Freq: Two times a day (BID) | ORAL | 0 refills | Status: AC

## 2016-10-15 MED ORDER — ZOLPIDEM TARTRATE 5 MG PO TABS
5.0000 mg | ORAL_TABLET | Freq: Once | ORAL | Status: AC
  Administered 2016-10-15: 5 mg via ORAL
  Filled 2016-10-15: qty 1

## 2016-10-15 NOTE — Clinical Social Work Note (Signed)
Clinical Social Work Assessment  Patient Details  Name: Kristina Meyer MRN: [id number] Date of Birth: 08/21/1940  Date of referral:  10/15/16               Reason for consult:  End of Life/Hospice                Permission sought to share information with:  Family Supports, Customer service manager Permission granted to share information::  Yes, Verbal Permission Granted  Name::     spouse Kristina Meyer  [phone], son Kristina Meyer [phone]  Agency::  [facility]  Relationship::     Contact Information:     Housing/Transportation Living arrangements for the past 2 months:  Single Family Home Source of Information:  Patient, Medical Team, Adult Children, Spouse Patient Interpreter Needed:  None Criminal Activity/Legal Involvement Pertinent to Current Situation/Hospitalization:  No - Comment as needed Significant Relationships:  Adult Children, Other Family Members, Spouse Lives with:  Spouse Do you feel safe going back to the place where you live?  Yes Need for family participation in patient care:  No (Coment)  Care giving concerns:  Pt from home where she resides with husband. Palliative care involved and pt/family interested in residential hospice at DC.    Social Worker assessment / plan:  CSW consulted for residential hospice placement. Met with pt and son at bedside and spoke with husband via phone. Agreed [facility] is their preference for hospice care.   CSW made referral and per [facility] representative, pt has bed today and pt's admission paperwork is complete.  Updated family and MD.  Family chooses [facility] for transport. CSW completed medical necessity form and will call for transport once pt ready for DC. Will provide DC summary for [facility] through the [place].    Plan: DC to [facility] for residential hospice.   Employment status:  Retired Nurse, adult PT Recommendations:  Not assessed at this time Information / Referral to community  resources:   (Hospice- residential)  Patient/Family's Response to care:  Family/ pt appreciative of care.   Patient/Family's Understanding of and Emotional Response to Diagnosis, Current Treatment, and Prognosis:  Pt/ family demonstrate adequate understanding of prognosis and plan. Emotional response sad but accepting at time of conversation with CSW.   Emotional Assessment Appearance:  Appears stated age Attitude/Demeanor/Rapport:   (appropriate to situation)   Affect (typically observed):  Calm, Accepting Orientation:  Oriented to Self, Oriented to Place, Oriented to  Time, Oriented to Situation Alcohol / Substance use:  Not Applicable Psych involvement (Current and /or in the community):  No (Comment)  Discharge Needs  Concerns to be addressed:  Discharge Planning Concerns Readmission within the last 30 days:  No Current discharge risk:  None Barriers to Discharge:  No Barriers Identified, will DC to residential hospice    Nila Nephew, LCSW 10/15/2016, 11:49 AM  [phone]

## 2016-10-15 NOTE — Progress Notes (Signed)
CSW received consult for residential hospice placement.  Spoke with pt's husband Thayer Jew [phone] who states preference is for [facility]. Agreed to have CSW contact [facility].  CSW made referral. Will continue following to assist.   Sharren Bridge, MSW, LCSW Clinical Social Work 10/15/2016 [phone]

## 2016-10-15 NOTE — Consult Note (Signed)
Referral MD  Reason for Referral: Refractory acute monoocytic leukemia; klebsiella urinary tract infection   Chief Complaint  Patient presents with  . Weakness  : I'm very tired and weak.  HPI: Mrs. Kristina Meyer is well-known to me. She is a very nice 76 year old white female. 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 She was treated with decitabine. She actually got into a remission by bone marrow biopsy done in December.  Unfortunately, she came out of remission in April. She presented with thrombocytopenia and anemia. A bone marrow biopsy which was done confirmed relapsed disease.  She was seen at [facility]. They recommended a retrial with decitabine.  She's had one cycle today.  She was admitted on May 18. She was weak. Her husband found her on the floor. He cannot pick her up. Her labs on admission showed a white count of 20,000. Hemoglobin 7.8. Platelet count 19,000. She had 61% blasts. Her electrolytes showed a BUN of 22 and creatinine 1.35. Her total bilirubin was 1.0. Her potassium was 3.2.  She has received blood and platelets.  Today, her Lasix has now to 33,000. Hemoglobin 9.2. Platelet count 51,000. She has been 57% blasts in the peripheral blood.  She is on antibiotics with ceftriaxone. Klebsiella is growing out of her urine.  It is obvious that she is declining quickly.  She is not eating much. She is not hurting. She does have quite a few bruises. She does have leukemic cutis all over her skin.  I talked to she and her husband this morning. I really believe that her prognosis is going to be less than 2 weeks. She is a DO NOT RESUSCITATE which is totally reasonable. She just is not able to go home and be cared for. As such, I think that the best option for her is [facility]. I will have to call them to see if they have a bed.  She is not complaining of any fever. There's no sweats. There is some nausea.  Overall, her performance status is ECOG  3-4.     Past Medical History:  Diagnosis Date  . Acute systolic CHF (congestive heart failure) ([place])    a. 04/2016: EF 35%, Grade 2 DD, Severe HK, mild to moderate MR  . 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 Anxiety   . Heart murmur    echo done 20 yrs ago  normal  . Hyperlipidemia   . Hypertension   . MVP (mitral valve prolapse)   . PONV (postoperative nausea and vomiting)    req patch  :  Past Surgical History:  Procedure Laterality Date  . BREAST CYST EXCISION Left 1970  . BREAST SURGERY Left 76   lump   . EYE SURGERY Bilateral 09   cataracts  . HEMORRHOID SURGERY  85  . IR GENERIC HISTORICAL  01/11/2016   IR US GUIDE VASC ACCESS RIGHT 01/11/2016 Jacqulynn Cadet, MD [facility]-INTERV RAD  . IR GENERIC HISTORICAL  01/11/2016   IR FLUORO GUIDE CV LINE RIGHT 01/11/2016 Jacqulynn Cadet, MD [facility]  . LUMBAR LAMINECTOMY/DECOMPRESSION MICRODISCECTOMY Right 12/02/2013   Procedure: L5-S1 RIGHT DISCECTOMY;  Surgeon: Melina Schools, MD;  Location: [place];  Service: Orthopedics;  Laterality: Right;  :   Current Facility-Administered Medications:  .  acetaminophen (TYLENOL) tablet 650 mg, 650 mg, Oral, Q6H PRN, Johnson, Clanford L, MD, 650 mg at 10/15/16 0052 .  ALPRAZolam Duanne Moron) tablet 0.5 mg, 0.5 mg, Oral, TID PRN, Florencia Reasons, MD, 0.5 mg at  10/14/16 2139 .  carvedilol (COREG) tablet 6.25 mg, 6.25 mg, Oral, BID WC, Florencia Reasons, MD .  cefpodoxime Bennie Pierini) tablet 200 mg, 200 mg, Oral, Q12H, Florencia Reasons, MD .  feeding supplement (ENSURE ENLIVE) (ENSURE ENLIVE) liquid 237 mL, 237 mL, Oral, BID BM, Florencia Reasons, MD, 237 mL at 10/14/16 0920 .  hydroxyurea (HYDREA) capsule 1,000 mg, 1,000 mg, Oral, BID, Lorren Splawn, Rudell Cobb, MD .  multivitamin with minerals tablet 1 tablet, 1 tablet, Oral, Daily, Florencia Reasons, MD, 1 tablet at 10/14/16 [id number] .  ondansetron (ZOFRAN) tablet 8 mg, 8 mg, Oral, Q8H PRN, Florencia Reasons, MD, 8 mg at 10/14/16 1620 .  oxyCODONE (ROXICODONE INTENSOL) 20 MG/ML concentrated solution  5 mg, 5 mg, Oral, Q2H PRN, Volanda Napoleon, MD .  PARoxetine (PAXIL) tablet 20 mg, 20 mg, Oral, Daily, Florencia Reasons, MD, 20 mg at 10/14/16 0907 .  prochlorperazine (COMPAZINE) tablet 10 mg, 10 mg, Oral, Q6H PRN, Florencia Reasons, MD .  sodium chloride flush (NS) 0.9 % injection 10-40 mL, 10-40 mL, Intracatheter, PRN, Johnson, Clanford L, MD .  sodium chloride flush (NS) 0.9 % injection 3 mL, 3 mL, Intravenous, Q12H, Florencia Reasons, MD, 3 mL at 10/14/16 2143  Facility-Administered Medications Ordered in Other Encounters:  .  sodium chloride flush (NS) 0.9 % injection 10 mL, 10 mL, Intravenous, PRN, [place], Sarah M, NP, 10 mL at 06/22/16 1403:  . carvedilol  6.25 mg Oral BID WC  . cefpodoxime  200 mg Oral Q12H  . feeding supplement (ENSURE ENLIVE)  237 mL Oral BID BM  . hydroxyurea  1,000 mg Oral BID  . multivitamin with minerals  1 tablet Oral Daily  . PARoxetine  20 mg Oral Daily  . sodium chloride flush  3 mL Intravenous Q12H  :  Allergies  Allergen Reactions  . Other Other (See Comments)    GENERAL Anesthesia, vomiting  :  Family History  Problem Relation Age of Onset  . Heart failure Mother   . Depression Mother   . Heart attack Father 42  . Leukemia Cousin   :  Social History   Social History  . Marital status: Married    Spouse name: N/A  . Number of children: 1  . Years of education: N/A   Occupational History  . Not on file.   Social History Main Topics  . Smoking status: Former Smoker    Packs/day: 0.50    Years: 5.00    Types: Cigarettes    Quit date: 12/02/1966  . Smokeless tobacco: Never Used  . Alcohol use No  . Drug use: No  . Sexual activity: Not on file   Other Topics Concern  . Not on file   Social History Narrative  . No narrative on file  :  Pertinent items are noted in HPI.  Exam: Patient Vitals for the past 24 hrs:  BP Temp Temp src Pulse Resp SpO2  10/15/16 0526 (!) 127/51 97.8 F (36.6 C) Oral 96 16 91 %  10/14/16 2040 (!) 138/55 99.7 F  (37.6 C) Oral (!) 104 16 90 %  10/14/16 1815 140/67 - - (!) 106 - -  10/14/16 1554 (!) 141/67 98.2 F (36.8 C) Oral (!) 106 16 90 %  10/14/16 1552 - 98.4 F (36.9 C) Oral - - -  10/14/16 1246 136/80 98.5 F (36.9 C) Oral 97 18 91 %  10/14/16 1220 (!) 146/65 98.9 F (37.2 C) Oral 99 16 91 %  10/14/16 0907 136/63 - -  100 - -  10/14/16 0711 135/66 98.7 F (37.1 C) Oral 95 18 94 %    As above    Recent Labs  10/14/16 0355 10/15/16 0437  WBC 15.1* 33.2*  HGB 9.6* 9.2*  HCT 27.4* 27.3*  PLT 9* 51*    Recent Labs  10/14/16 0355 10/15/16 0437  NA 137 140  K 3.5 3.2*  CL 105 105  CO2 24 26  GLUCOSE 117* 110*  BUN 18 15  CREATININE 1.08* 0.95  CALCIUM 9.3 9.5    Blood smear review:  Pending  Pathology: None     Assessment and Plan:  Kristina Meyer is a 76 year old white female with refractory acute myelocytic leukemia. She has had one cycle of decitabine. Her blasts are certainly of quite a bit. Her leukemic cutis which is one of her presenting features for relapsed disease clearly is worse.  I do not feel that there is anything else that we can offer her. I believe that her disease process is moving ahead quite aggressively.  I know that she has done everything that we have asked her to do. I just want to make sure that she has, respect, dignity, and comfort.  Again, getting her over to [facility] will really be a great idea for her. Her husband just cannot take care of her at home. I do not want him being a 24-hour nurse for her. He just is not capable of doing that.  They both agree that [facility] would be the best option. I will call [facility] [place] this morning to see if a bed is available.  I will start her on some Hydrea. I will have her on 1000 mg twice a day. Maybe this will control her white cell count a little bit.  I just feel bad for her. I know that she is trying hard. She is not afraid to die. She knows where she is going. She knows that she will be  "whole again."  I spent about 45 minutes with she and her husband this morning.  Lattie Haw, MD  Psalm 91:1-2

## 2016-10-15 NOTE — Progress Notes (Signed)
Nutrition Brief Note  Pt identified as at nutrition risk on the Malnutrition Screen Tool.  Chart reviewed. Pt wanting residential hospice placement. No nutrition interventions warranted at this time.   Clayton Bibles, MS, RD, LDN Pager: [phone] After Hours Pager: [phone]

## 2016-10-15 NOTE — Discharge Summary (Signed)
Discharge Summary  [name] FGH:[id number] DOB: 1940-09-15  PCP: Shelda Pal, DO  Admit date: 10/12/2016 Discharge date: 10/15/2016  Time spent: >62mins, more than 50% time spent on coordination of care  Discharge to residential hospice  Discharge Diagnoses:  Active Hospital Problems   Diagnosis Date Noted  . Sepsis ([place]) 10/12/2016    Resolved Hospital Problems   Diagnosis Date Noted Date Resolved  No resolved problems to display.    Discharge Condition: stable  Diet recommendation: diet as tolerated  Filed Weights   10/12/16 1834  Weight: 58.1 kg (128 lb 1.4 oz)    History of present illness:  PCP: Shelda Pal, DO   Chief Complaint: weakness  HPI: Kristina Meyer is a 76 y.o. female  With AML relapse since 08/2016 received chemotherapy last on 5/9 with decitabine, she got blood transfusion on5/10, then platelet transfusion on 5/14 due to thrombocytopenia plt of 6 on 5/14,she is brought to  to [facility] ED by EMS due to increased weakness and feeling nauseous. . today she went to the bathroom and her husband could barely get her out EMS reports pt was positive for orthostatics and an IV was started and a 565ml bag of fluid was hung.   ED course: she has sinus tachycardia, heart rate above 100, , tachypnea, RR 22-23. bp stable, no hypoxia. cxr no acute findings, ua + uti, though patient denies urinary symptom, wbc 19.9 + blasts, hgb7.8, plt 19, cr 1.5, she is given rocephin, hospitalist called to admit the patient, I requested blood culture to be drawn in the ED.  Patient has No fever, no diarrhea, no bleeding, no chest pain, no edema, she does has diffuse ecchymosis.   Hospital Course:  Active Problems:   Sepsis ([place])   AML in relapse/pancytopenia:  received chemotherapy last on 5/9 with decitabine, she got blood transfusion on5/10, then platelet transfusion on 5/14 due to thrombocytopenia plt of 6 on 5/14, Supportive transfusion, transfuse plt is plt  less than 10 or active bleed, keep hgb >7. s/p plt transfusion on 5/20 due to platelet dropped to 9 Oncology Dr Marin Olp consulted, oncology recommended residential hospice , detailed please see Dr Marin Olp note on 5/21   Sepsis in an immunosuppressed individual: She presented with sinus tachycardia, heart rate above 100, , tachypnea, RR 22-23. Wbc19.9, and UTI.  Sepsis physiology is improving with aggressive therapies.    Blood culture obtained from the ED no growth to date, urine culture with klebsiella pneumoniae She is treated with rocephin and IVF since admission, she is transitioned to oral abx vantin, she is prescribed one more day of vantin to finish treatment.   AKI on CKD III Cr 1.1 on 5/9 Cr has been increasing since 5/14 Cr on presentation on 5/18, 1.52 Likely from uti/sepsis Hold losartan,  avoid hypotension, treat uti. Cr 0.95 on 5/21  Hypokalemia/hypomagnesemia: replaced   H/o systolic CHFduring severe sepsis/pneumonia/prolonged hospitalization in 04/2016, repeat echocardiogram in 07/2016, lvef has improved to 50-55% Continue coreg ( dose reduced to 3.125 bid on presentation due to sepsis, bp improving, coreg dose changed back to home dose at 6.25 bid on 5/20),  hold losartan in the setting of sepsis and worsening CKD. D/c ivf on 5/20  FTT: progressive weakness, Over all poor prognosis due to AML in relapse, FTT Discharge to residential hospice  DVT prophylaxis: scd  Consultants:oncology  Code Status:DNR  Family Communication:Patient and son at bedside   Procedures:  Platelet transfusion  Consultations:  oncology  Discharge Exam:  BP (!) 127/51 (BP Location: Right Arm)   Pulse 96   Temp 97.8 F (36.6 C) (Oral)   Resp 16   Ht 5\' 1"  (1.549 m)   Wt 58.1 kg (128 lb 1.4 oz)   SpO2 91%   BMI 24.20 kg/m    General exam: Thin, Frail, pale, chronically ill, scattered ecchymosis, anxious Respiratory system: Clear. No increased work of  breathing. Cardiovascular system: S1 & S2 heard, RRR.  Gastrointestinal system: Abdomen is nondistended, soft and nontender. Normal bowel sounds heard. Central nervous system: Alert and oriented. No focal neurological deficits. Extremities: no CCE.    Discharge Instructions    Diet general    Complete by:  As directed    Increase activity slowly    Complete by:  As directed      Allergies as of 10/15/2016      Reactions   Other Other (See Comments)   GENERAL Anesthesia, vomiting      Medication List    STOP taking these medications   atorvastatin 20 MG tablet Commonly known as:  LIPITOR   CALCIUM 1200+D3 PO   cholecalciferol 1000 units tablet Commonly known as:  VITAMIN D   losartan 25 MG tablet Commonly known as:  COZAAR   MULTIVITAMIN ADULT Tabs   orphenadrine 100 MG tablet Commonly known as:  NORFLEX   sulfamethoxazole-trimethoprim 800-160 MG per tablet Commonly known as:  BACTRIM DS     TAKE these medications   acetaminophen 325 MG tablet Commonly known as:  TYLENOL Take 2 tablets (650 mg total) by mouth every 6 (six) hours as needed for mild pain, fever or headache.   allopurinol 300 MG tablet Commonly known as:  ZYLOPRIM Take 300 mg by mouth.   ALPRAZolam 0.5 MG tablet Commonly known as:  XANAX TAKE 1 TABLET BY MOUTH 3 TIMES A DAY AS NEEDED What changed:  See the new instructions.   carvedilol 6.25 MG tablet Commonly known as:  COREG Take 1 tablet (6.25 mg total) by mouth 2 (two) times daily.   cefpodoxime 200 MG tablet Commonly known as:  VANTIN Take 1 tablet (200 mg total) by mouth every 12 (twelve) hours.   hydroxyurea 500 MG capsule Commonly known as:  HYDREA Take 2 capsules (1,000 mg total) by mouth 2 (two) times daily. May take with food to minimize GI side effects.   lidocaine-prilocaine cream Commonly known as:  EMLA Apply 1 application topically as needed. Place quarter sized amount of cream directly on portacath and cover with  saran wrap at least 1 - 1 1/2 hours prior to procedure.   magic mouthwash w/lidocaine Soln Take 5 mLs by mouth 4 (four) times daily as needed for mouth pain.   ondansetron 8 MG tablet Commonly known as:  ZOFRAN Take 1 tablet (8 mg total) by mouth every 8 (eight) hours as needed for nausea or vomiting.   oxyCODONE 20 MG/ML concentrated solution Commonly known as:  ROXICODONE INTENSOL Take 0.3 mLs (6 mg total) by mouth every 2 (two) hours as needed for severe pain.   PARoxetine 20 MG tablet Commonly known as:  PAXIL Take 1 tablet (20 mg total) by mouth daily.   potassium chloride SA 20 MEQ tablet Commonly known as:  KLOR-CON M20 Take 1 tablet (20 mEq total) by mouth daily. What changed:  See the new instructions.   prochlorperazine 10 MG tablet Commonly known as:  COMPAZINE Take 1 tablet (10 mg total) by mouth every 6 (six) hours as needed for nausea  or vomiting.      Allergies  Allergen Reactions  . Other Other (See Comments)    GENERAL Anesthesia, vomiting      The results of significant diagnostics from this hospitalization (including imaging, microbiology, ancillary and laboratory) are listed below for reference.    Significant Diagnostic Studies: Dg Chest 2 View  Result Date: 10/12/2016 CLINICAL DATA:  Nausea, weakness, leukemia, receiving chemotherapy EXAM: CHEST  2 VIEW COMPARISON:  05/16/2016 FINDINGS: Right IJ power port catheter tip lower SVC level as before. Normal heart size and vascularity. Lungs remain clear. No focal pneumonia, collapse or consolidation. Negative for edema, effusion or pneumothorax. Trachea is midline. Atherosclerosis noted of the aorta. Degenerative changes of the spine. IMPRESSION: No acute chest process. Electronically Signed   By: Jerilynn Mages.  Shick M.D.   On: 10/12/2016 12:19    Microbiology: Recent Results (from the past 240 hour(s))  Urine culture     Status: Abnormal   Collection Time: 10/12/16  3:41 PM  Result Value Ref Range Status    Specimen Description URINE, CLEAN CATCH  Final   Special Requests Normal  Final   Culture >=100,000 COLONIES/mL KLEBSIELLA PNEUMONIAE (A)  Final   Report Status 10/14/2016 FINAL  Final   Organism ID, Bacteria KLEBSIELLA PNEUMONIAE (A)  Final      Susceptibility   Klebsiella pneumoniae - MIC*    AMPICILLIN >=32 RESISTANT Resistant     CEFAZOLIN <=4 SENSITIVE Sensitive     CEFTRIAXONE <=1 SENSITIVE Sensitive     CIPROFLOXACIN <=0.25 SENSITIVE Sensitive     GENTAMICIN <=1 SENSITIVE Sensitive     IMIPENEM <=0.25 SENSITIVE Sensitive     NITROFURANTOIN 64 INTERMEDIATE Intermediate     TRIMETH/SULFA >=320 RESISTANT Resistant     AMPICILLIN/SULBACTAM 4 SENSITIVE Sensitive     PIP/TAZO <=4 SENSITIVE Sensitive     Extended ESBL NEGATIVE Sensitive     * >=100,000 COLONIES/mL KLEBSIELLA PNEUMONIAE  MRSA PCR Screening     Status: None   Collection Time: 10/12/16  6:35 PM  Result Value Ref Range Status   MRSA by PCR NEGATIVE NEGATIVE Final    Comment:        The GeneXpert MRSA Assay (FDA approved for NASAL specimens only), is one component of a comprehensive MRSA colonization surveillance program. It is not intended to diagnose MRSA infection nor to guide or monitor treatment for MRSA infections.   Culture, blood (routine x 2)     Status: None (Preliminary result)   Collection Time: 10/12/16  7:00 PM  Result Value Ref Range Status   Specimen Description BLOOD RIGHT ANTECUBITAL  Final   Special Requests   Final    BOTTLES DRAWN AEROBIC AND ANAEROBIC Blood Culture adequate volume   Culture   Final    NO GROWTH 3 DAYS Performed at [facility], [address] [place], [place] [address]    Report Status PENDING  Incomplete  Culture, blood (routine x 2)     Status: None (Preliminary result)   Collection Time: 10/12/16  7:06 PM  Result Value Ref Range Status   Specimen Description BLOOD RIGHT ARM  Final   Special Requests   Final    BOTTLES DRAWN AEROBIC AND ANAEROBIC Blood  Culture adequate volume   Culture   Final    NO GROWTH 3 DAYS Performed at [facility], [address], [place], [place] [address]    Report Status PENDING  Incomplete     Labs: Basic Metabolic Panel:  Recent  Labs Lab 10/11/16 1358 10/12/16 1318 10/13/16 0500 10/14/16 0355 10/15/16 0437  NA 137 138 139 137 140  K 3.9 3.4* 3.2* 3.5 3.2*  CL 102 103 105 105 105  CO2 28 26 25 24 26   GLUCOSE 133* 127* 104* 117* 110*  BUN 17 20 22* 18 15  CREATININE 1.44* 1.52* 1.35* 1.08* 0.95  CALCIUM 11.7* 10.7* 9.8 9.3 9.5  MG  --   --  1.6* 2.1  --    Liver Function Tests:  Recent Labs Lab 10/08/16 1402 10/11/16 1358 10/12/16 1318 10/13/16 0500  AST 24 31 31 29   ALT 14 14 15  12*  ALKPHOS 76 70 59 50  BILITOT 0.8 0.8 1.2 1.0  PROT 6.3 6.4 6.2* 5.3*  ALBUMIN 4.4 4.4 4.0 3.6   No results for input(s): LIPASE, AMYLASE in the last 168 hours. No results for input(s): AMMONIA in the last 168 hours. CBC:  Recent Labs Lab 10/08/16 1402 10/11/16 1358 10/12/16 1318 10/13/16 0744 10/14/16 0355 10/15/16 0437  WBC 3.8* 9.6 19.9* 17.1* 15.1* 33.2*  NEUTROABS 0.9*  --  0.2*  --  0.0* 0.3*  HGB 9.8* 9.0* 7.8* 7.0* 9.6* 9.2*  HCT 28.6* 26.9* 23.4* 20.9* 27.4* 27.3*  MCV 95 95 93.2 93.3 89.3 89.5  PLT <6* 21* 19* 11* 9* 51*   Cardiac Enzymes: No results for input(s): CKTOTAL, CKMB, CKMBINDEX, TROPONINI in the last 168 hours. BNP: BNP (last 3 results) No results for input(s): BNP in the last 8760 hours.  ProBNP (last 3 results) No results for input(s): PROBNP in the last 8760 hours.  CBG: No results for input(s): GLUCAP in the last 168 hours.     SignedFlorencia Reasons MD, PhD  [facility] 10/15/2016, 12:32 PM

## 2016-10-15 NOTE — Progress Notes (Signed)
[facility] [employer] Received request from [place] for family interest in [place]. Chart reviewed. Spoke with spouse by phone and met with patient and son at bedside to complete paperwork for transfer to [facility] today. CSW Meghan aware paper work has been completed.  Please fax discharge summary to [phone].  RN please call report to [phone].  Thank you,  Erling Conte, LCSW [phone]

## 2016-10-17 LAB — CULTURE, BLOOD (ROUTINE X 2)
CULTURE: NO GROWTH
Culture: NO GROWTH
SPECIAL REQUESTS: ADEQUATE
Special Requests: ADEQUATE

## 2016-10-23 ENCOUNTER — Other Ambulatory Visit: Payer: Medicare Other

## 2016-10-23 ENCOUNTER — Ambulatory Visit: Payer: Medicare Other | Admitting: Family

## 2016-10-23 ENCOUNTER — Ambulatory Visit: Payer: Medicare Other

## 2016-10-24 ENCOUNTER — Ambulatory Visit: Payer: Medicare Other

## 2016-10-25 ENCOUNTER — Telehealth: Payer: Self-pay | Admitting: *Deleted

## 2016-10-25 ENCOUNTER — Ambulatory Visit: Payer: Medicare Other

## 2016-10-25 NOTE — Telephone Encounter (Signed)
Notified from [facility] that patient passed away on 2016-11-12 at 9:49p.  Dr Marin Olp notified.

## 2016-10-26 ENCOUNTER — Ambulatory Visit: Payer: Medicare Other

## 2016-10-26 DEATH — deceased

## 2016-10-29 ENCOUNTER — Other Ambulatory Visit: Payer: Medicare Other

## 2016-10-29 ENCOUNTER — Ambulatory Visit: Payer: Medicare Other

## 2016-10-30 ENCOUNTER — Ambulatory Visit: Payer: Medicare Other

## 2016-10-31 ENCOUNTER — Ambulatory Visit: Payer: Medicare Other

## 2017-01-09 ENCOUNTER — Other Ambulatory Visit: Payer: Self-pay | Admitting: Nurse Practitioner

## 2017-04-08 IMAGING — RF DG SWALLOWING FUNCTION - NRPT [facility]
8 series · 20 of 24 positions shown · non-contrast
Comparison: none

[Series 1: cp_standard · 0.36mm/px · 2 of 35 frames shown (1 of 8)]
[frame 6/35]
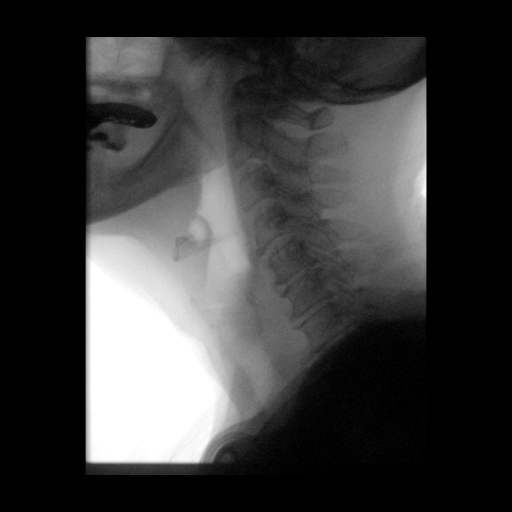
[frame 18/35]
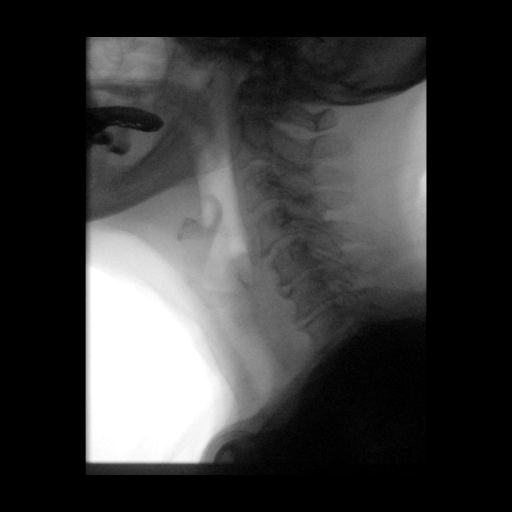

[Series 2: cp_standard · 0.36mm/px · 3 of 51 frames shown (2 of 8)]
[frame 8/51]
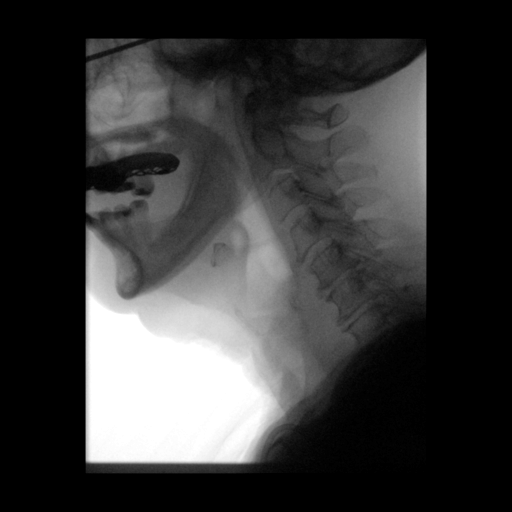
[frame 26/51]
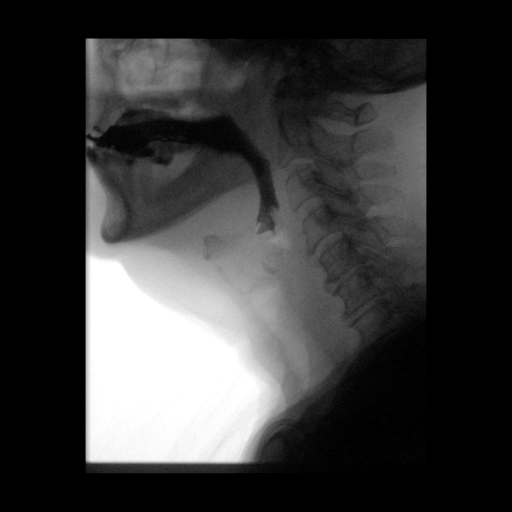
[frame 51/51]
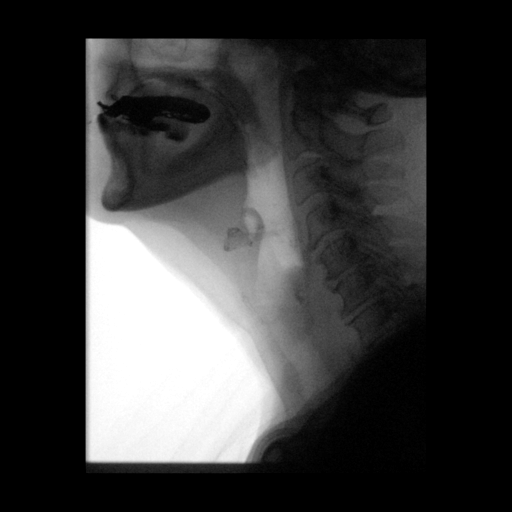

[Series 3: cp_standard · 0.36mm/px · 2 of 73 frames shown (3 of 8)]
[frame 11/73]
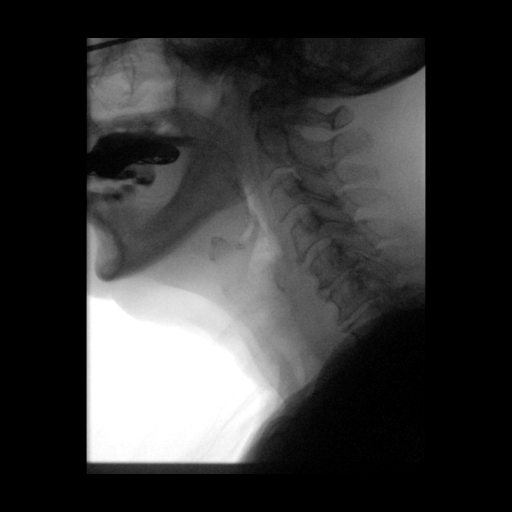
[frame 37/73]
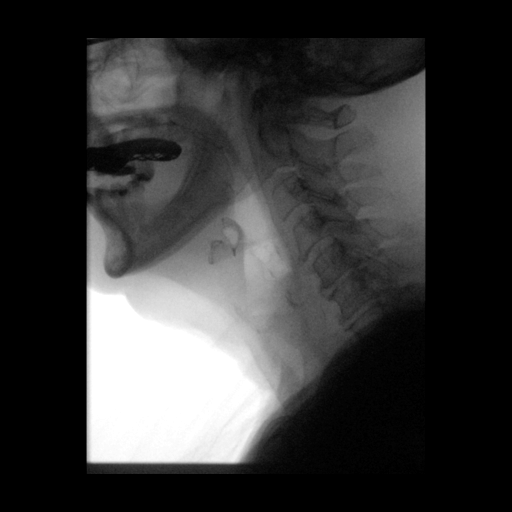

[Series 4: cp_standard · 0.36mm/px · 3 of 69 frames shown (4 of 8)]
[frame 11/69]
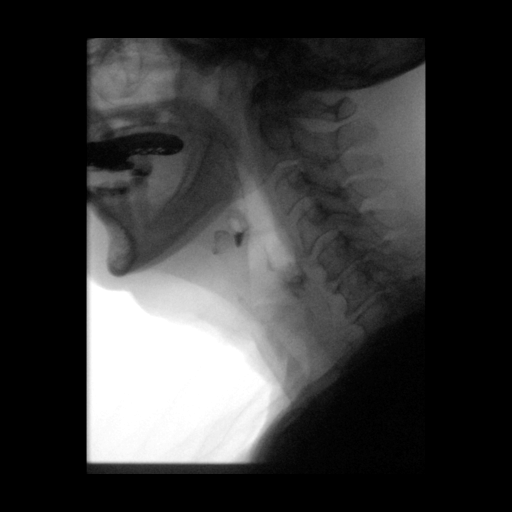
[frame 35/69]
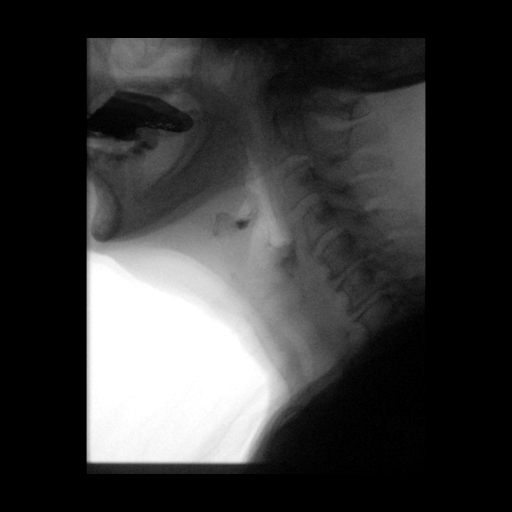
[frame 69/69]
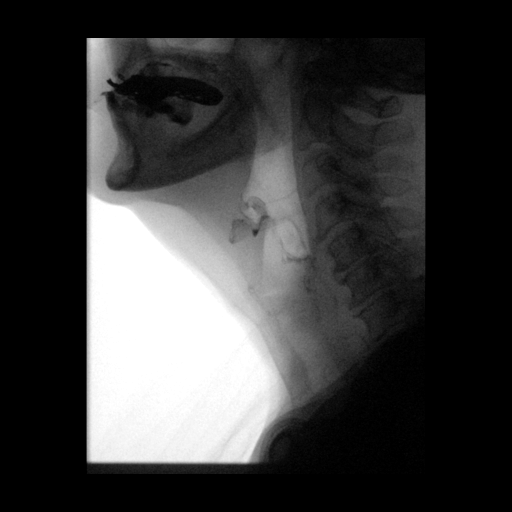

[Series 5: cp_standard · 0.36mm/px · 2 of 32 frames shown (5 of 8)]
[frame 5/32]
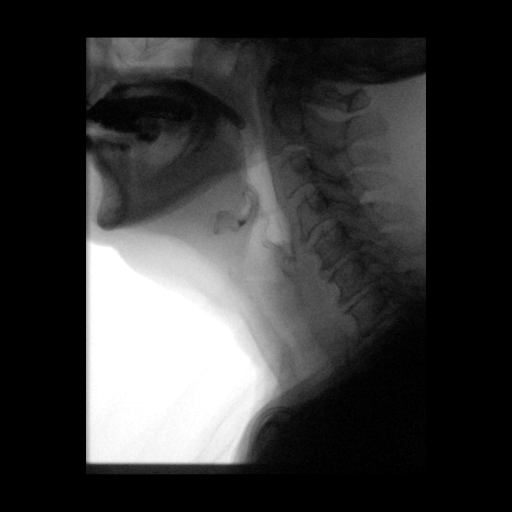
[frame 28/32]
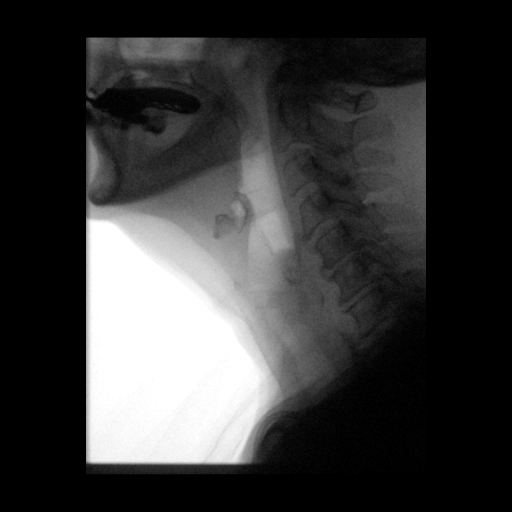

[Series 6: cp_standard · 0.36mm/px · 3 of 28 frames shown (6 of 8)]
[frame 5/28]
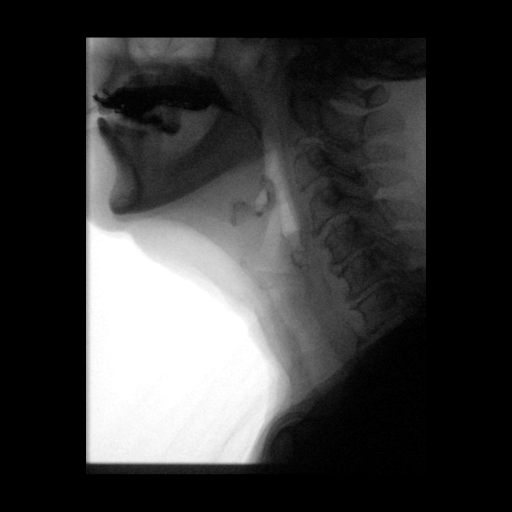
[frame 24/28]
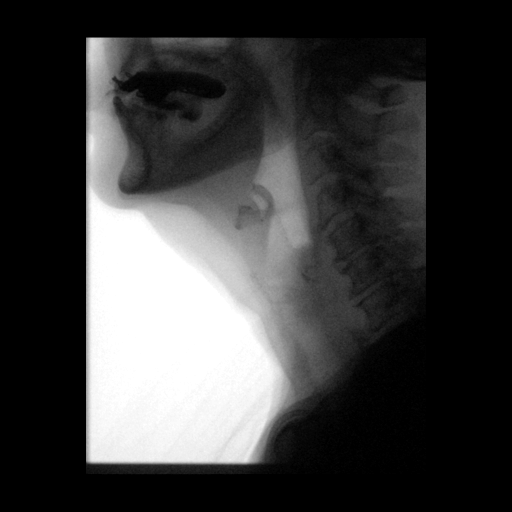
[frame 26/28]
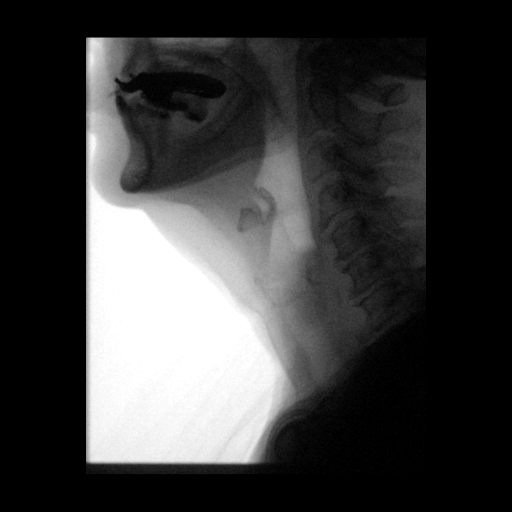

[Series 7: cp_standard · 0.36mm/px · 2 of 44 frames shown (7 of 8)]
[frame 7/44]
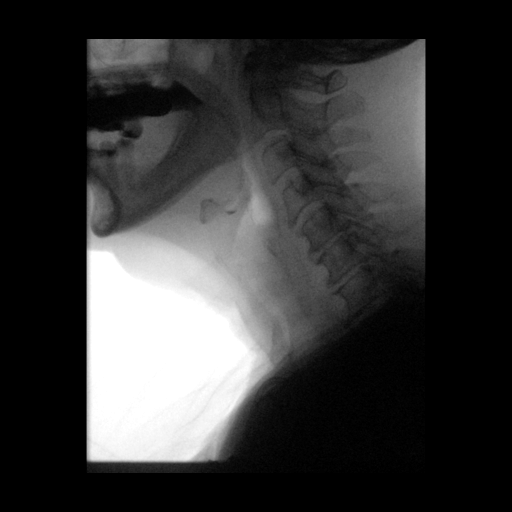
[frame 31/44]
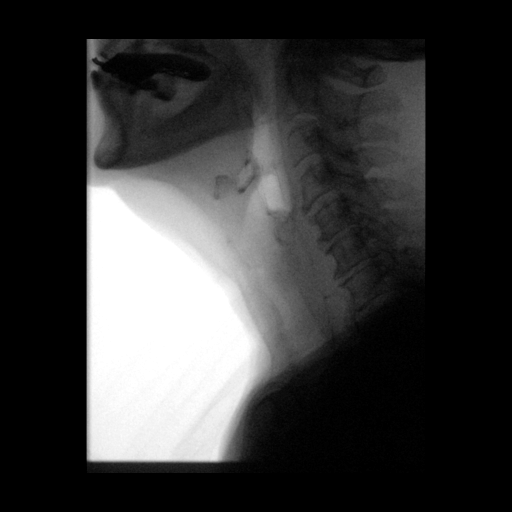

[Series 8: cp_standard · 0.35mm/px · 3 of 23 frames shown (8 of 8)]
[frame 4/23]
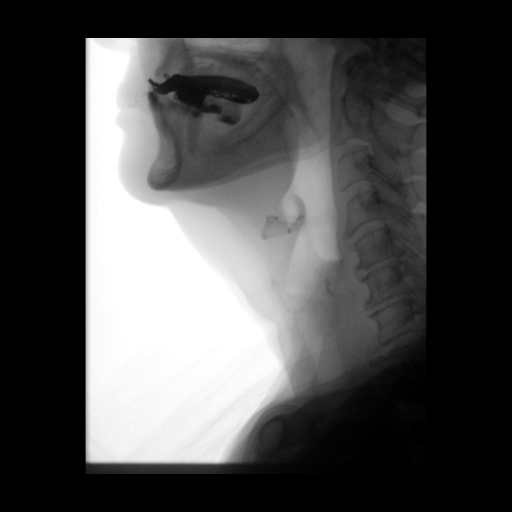
[frame 12/23]
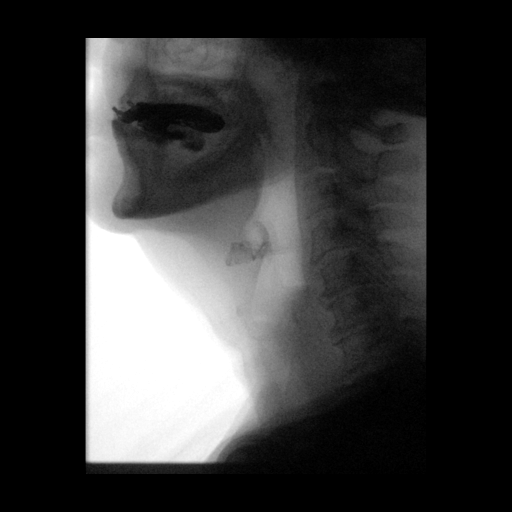
[frame 20/23]
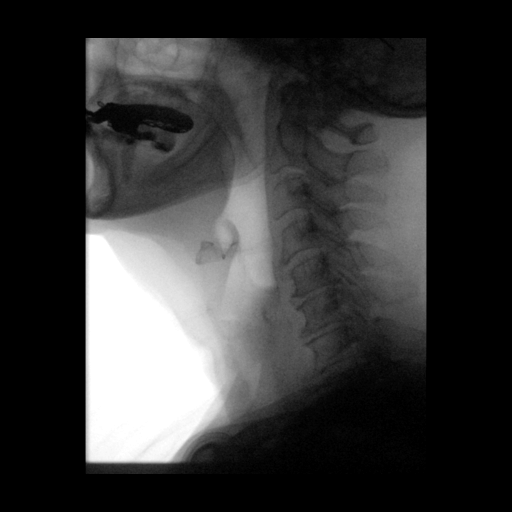

[20 of 24 positions shown; findings below may reference images not displayed]

FLUOROSCOPY FOR SWALLOWING FUNCTION STUDY:
Fluoroscopy was provided for swallowing function study, which was administered by a speech pathologist.  Final results and recommendations from this study are contained within the speech pathology report.

## 2018-07-18 ENCOUNTER — Encounter: Payer: Self-pay | Admitting: Hematology & Oncology
